# Patient Record
Sex: Female | Born: 1970
Health system: Southern US, Community
[De-identification: ages and names within clinical notes are randomized; demographics above are authoritative.]

## PROBLEM LIST (undated history)

## (undated) DIAGNOSIS — G4730 Sleep apnea, unspecified: Secondary | ICD-10-CM

## (undated) DIAGNOSIS — R7303 Prediabetes: Secondary | ICD-10-CM

## (undated) DIAGNOSIS — F419 Anxiety disorder, unspecified: Secondary | ICD-10-CM

## (undated) DIAGNOSIS — I10 Essential (primary) hypertension: Secondary | ICD-10-CM

## (undated) DIAGNOSIS — R002 Palpitations: Secondary | ICD-10-CM

## (undated) DIAGNOSIS — E119 Type 2 diabetes mellitus without complications: Secondary | ICD-10-CM

## (undated) DIAGNOSIS — C50919 Malignant neoplasm of unspecified site of unspecified female breast: Secondary | ICD-10-CM

## (undated) DIAGNOSIS — K219 Gastro-esophageal reflux disease without esophagitis: Secondary | ICD-10-CM

## (undated) DIAGNOSIS — F32A Depression, unspecified: Secondary | ICD-10-CM

## (undated) HISTORY — DX: Palpitations: R00.2

## (undated) HISTORY — PX: BREAST SURGERY: SHX581

## (undated) HISTORY — DX: Essential (primary) hypertension: I10

## (undated) HISTORY — DX: Type 2 diabetes mellitus without complications: E11.9

## (undated) HISTORY — DX: Sleep apnea, unspecified: G47.30

## (undated) HISTORY — PX: TUBAL LIGATION: SHX77

---

## 2006-08-23 ENCOUNTER — Emergency Department (HOSPITAL_COMMUNITY): Admission: EM | Admit: 2006-08-23 | Discharge: 2006-08-23 | Payer: Self-pay | Admitting: Emergency Medicine

## 2006-09-25 ENCOUNTER — Other Ambulatory Visit: Admission: RE | Admit: 2006-09-25 | Discharge: 2006-09-25 | Payer: Self-pay | Admitting: Internal Medicine

## 2007-02-01 ENCOUNTER — Emergency Department (HOSPITAL_COMMUNITY): Admission: EM | Admit: 2007-02-01 | Discharge: 2007-02-01 | Payer: Self-pay | Admitting: Emergency Medicine

## 2007-03-09 ENCOUNTER — Emergency Department (HOSPITAL_COMMUNITY): Admission: EM | Admit: 2007-03-09 | Discharge: 2007-03-09 | Payer: Self-pay | Admitting: Emergency Medicine

## 2007-04-03 ENCOUNTER — Other Ambulatory Visit: Admission: RE | Admit: 2007-04-03 | Discharge: 2007-04-03 | Payer: Self-pay | Admitting: Internal Medicine

## 2008-09-23 ENCOUNTER — Other Ambulatory Visit: Admission: RE | Admit: 2008-09-23 | Discharge: 2008-09-23 | Payer: Self-pay | Admitting: Internal Medicine

## 2009-06-13 ENCOUNTER — Inpatient Hospital Stay (HOSPITAL_COMMUNITY): Admission: AD | Admit: 2009-06-13 | Discharge: 2009-06-15 | Payer: Self-pay | Admitting: Obstetrics & Gynecology

## 2010-10-31 LAB — CBC
HCT: 31.8 % — ABNORMAL LOW (ref 36.0–46.0)
HCT: 35.3 % — ABNORMAL LOW (ref 36.0–46.0)
Hemoglobin: 10.7 g/dL — ABNORMAL LOW (ref 12.0–15.0)
Hemoglobin: 11.6 g/dL — ABNORMAL LOW (ref 12.0–15.0)
MCHC: 32.9 g/dL (ref 30.0–36.0)
MCHC: 33.6 g/dL (ref 30.0–36.0)
MCV: 89.2 fL (ref 78.0–100.0)
MCV: 90.1 fL (ref 78.0–100.0)
Platelets: 264 10*3/uL (ref 150–400)
Platelets: 277 10*3/uL (ref 150–400)
RBC: 3.53 MIL/uL — ABNORMAL LOW (ref 3.87–5.11)
RBC: 3.96 MIL/uL (ref 3.87–5.11)
RDW: 13.7 % (ref 11.5–15.5)
RDW: 13.9 % (ref 11.5–15.5)
WBC: 13 10*3/uL — ABNORMAL HIGH (ref 4.0–10.5)
WBC: 7.4 10*3/uL (ref 4.0–10.5)

## 2010-10-31 LAB — URINALYSIS, DIPSTICK ONLY
Bilirubin Urine: NEGATIVE
Glucose, UA: NEGATIVE mg/dL
Hgb urine dipstick: NEGATIVE
Ketones, ur: 15 mg/dL — AB
Leukocytes, UA: NEGATIVE
Nitrite: NEGATIVE
Protein, ur: NEGATIVE mg/dL
Specific Gravity, Urine: 1.015 (ref 1.005–1.030)
Urobilinogen, UA: 0.2 mg/dL (ref 0.0–1.0)
pH: 6.5 (ref 5.0–8.0)

## 2010-10-31 LAB — CCBB MATERNAL DONOR DRAW

## 2010-10-31 LAB — RPR: RPR Ser Ql: NONREACTIVE

## 2010-11-30 ENCOUNTER — Encounter (HOSPITAL_COMMUNITY): Payer: 59

## 2010-11-30 ENCOUNTER — Other Ambulatory Visit: Payer: Self-pay | Admitting: Obstetrics and Gynecology

## 2010-11-30 LAB — BASIC METABOLIC PANEL
BUN: 14 mg/dL (ref 6–23)
CO2: 24 mEq/L (ref 19–32)
Calcium: 9.3 mg/dL (ref 8.4–10.5)
Chloride: 103 mEq/L (ref 96–112)
Creatinine, Ser: 0.92 mg/dL (ref 0.4–1.2)
GFR calc Af Amer: >60 mL/min (ref >60)
GFR calc non Af Amer: >60 mL/min (ref >60)
Glucose, Bld: 90 mg/dL (ref 70–99)
Potassium: 3.6 mEq/L (ref 3.5–5.1)
Sodium: 138 mEq/L (ref 135–145)

## 2010-11-30 LAB — CBC
HCT: 43.4 % (ref 36.0–46.0)
Hemoglobin: 14.5 g/dL (ref 12.0–15.0)
MCH: 30.3 pg (ref 26.0–34.0)
MCHC: 33.4 g/dL (ref 30.0–36.0)
MCV: 90.6 fL (ref 78.0–100.0)
Platelets: 257 10*3/uL (ref 150–400)
RBC: 4.79 MIL/uL (ref 3.87–5.11)
RDW: 13.7 % (ref 11.5–15.5)
WBC: 9.2 10*3/uL (ref 4.0–10.5)

## 2010-11-30 LAB — SURGICAL PCR SCREEN: Staphylococcus aureus: INVALID — AB

## 2010-12-03 LAB — MRSA CULTURE

## 2010-12-07 ENCOUNTER — Ambulatory Visit (HOSPITAL_COMMUNITY)
Admission: RE | Admit: 2010-12-07 | Discharge: 2010-12-07 | Disposition: A | Discharge status: Home / Self Care | Payer: 59 | Source: Ambulatory Visit | Attending: Obstetrics and Gynecology | Admitting: Obstetrics and Gynecology

## 2010-12-07 DIAGNOSIS — Z01812 Encounter for preprocedural laboratory examination: Secondary | ICD-10-CM | POA: Insufficient documentation

## 2010-12-07 DIAGNOSIS — Z01818 Encounter for other preprocedural examination: Secondary | ICD-10-CM | POA: Insufficient documentation

## 2010-12-07 DIAGNOSIS — Z302 Encounter for sterilization: Secondary | ICD-10-CM | POA: Insufficient documentation

## 2010-12-07 LAB — PREGNANCY, URINE: Preg Test, Ur: NEGATIVE

## 2010-12-15 NOTE — Op Note (Signed)
  NAMEHETAL, PROANO                ACCOUNT NO.:  [id number]  MEDICAL RECORD NO.:  [id number]           PATIENT TYPE:  O  LOCATION:  [facility]                          [facility]  PHYSICIAN:  Maxie Better, M.D.DATE OF BIRTH:  November 20, 1970  DATE OF PROCEDURE:  12/07/2010 DATE OF DISCHARGE:                              OPERATIVE REPORT   PREOPERATIVE DIAGNOSIS:  Desires sterilization.  PROCEDURE:  Laparoscopic tubal ligation with bipolar cautery.  POSTOPERATIVE DIAGNOSES: 1. Desires sterilization. 2. Fibroid uterus.  ANESTHESIA:  General.  SURGEON:  Maxie Better, MD  ASSISTANT:  None.  PROCEDURE:  Under adequate general anesthesia, the patient was placed in a dorsal lithotomy position.  She was sterilely prepped and draped in the usual fashion.  The bladder was catheterized for small amount of urine.  Examination under anesthesia revealed an irregular anteverted uterus.  No adnexal masses could be appreciated.  A bivalve speculum was placed in the vagina.  Single-tooth tenaculum was placed on the anterior lip of the cervix and acorn cannula was introduced into the cervical os and attached to the tenaculum for manipulation of the uterus.  The bivalve speculum was removed.  Attention was then turned to the abdomen. A 0.25% Marcaine was injected infraumbilically.  An infraumbilical incision was then made and Veress needle was placed without difficulty, then tested with normal saline.  Carbon dioxide was insufflated.  Veress needle was then subsequently removed.  A 10-mm disposable trocar with sleeve was introduced into abdominal cavity without incident. A lighted video laparoscope was placed through that port.  Second incision was made suprapubically and 5-mm port was placed under direct visualization.  Panoramic inspection showed normal liver edge.  The appendix was not viewed.  Fibroid uterus was noted.  No endometriosis in the anterior and posterior cul-de-sac.   Normal ovaries bilaterally. Normal tubes bilaterally.  The midportion of both fallopian tubes was cauterized with a bipolar instrument.  When that was felt to be an adequate, the infraumbilical site was removed.  The abdomen was deflated and the suprapubic site was removed as well.  The incisions were closed with 4-0 Vicryl subcuticular stitches.  The instruments from the vagina was removed.  Specimen was none.  Estimated blood loss was minimal. Complications were none.  The patient tolerated the procedure well and was transferred to recovery room in stable condition.     Maxie Better, M.D.     [place]/MEDQ  D:  12/07/2010  T:  12/07/2010  Job:  [id number]  Electronically Signed by Nena Jordan COUSINS M.D. on 12/15/2010 06:48:27 PM

## 2011-05-13 LAB — POCT URINALYSIS DIP (DEVICE)
Bilirubin Urine: NEGATIVE
Glucose, UA: NEGATIVE
Hgb urine dipstick: NEGATIVE
Ketones, ur: NEGATIVE
Nitrite: NEGATIVE
Operator id: 282151
Protein, ur: NEGATIVE
Specific Gravity, Urine: 1.025
Urobilinogen, UA: 0.2
pH: 5.5

## 2011-05-13 LAB — WET PREP, GENITAL
Trich, Wet Prep: NONE SEEN
Yeast Wet Prep HPF POC: NONE SEEN

## 2011-05-13 LAB — GC/CHLAMYDIA PROBE AMP, GENITAL
Chlamydia, DNA Probe: NEGATIVE
GC Probe Amp, Genital: NEGATIVE

## 2011-12-03 ENCOUNTER — Other Ambulatory Visit (HOSPITAL_COMMUNITY): Payer: Self-pay | Admitting: Obstetrics and Gynecology

## 2011-12-03 DIAGNOSIS — Z1231 Encounter for screening mammogram for malignant neoplasm of breast: Secondary | ICD-10-CM

## 2011-12-06 ENCOUNTER — Encounter: Payer: Self-pay | Admitting: *Deleted

## 2012-01-24 ENCOUNTER — Ambulatory Visit (HOSPITAL_COMMUNITY): Payer: 59

## 2012-02-24 ENCOUNTER — Ambulatory Visit (HOSPITAL_COMMUNITY)
Admission: RE | Admit: 2012-02-24 | Discharge: 2012-02-24 | Disposition: A | Discharge status: Home / Self Care | Payer: 59 | Source: Ambulatory Visit | Attending: Obstetrics and Gynecology | Admitting: Obstetrics and Gynecology

## 2012-02-24 DIAGNOSIS — Z1231 Encounter for screening mammogram for malignant neoplasm of breast: Secondary | ICD-10-CM | POA: Insufficient documentation

## 2012-07-04 ENCOUNTER — Emergency Department (HOSPITAL_BASED_OUTPATIENT_CLINIC_OR_DEPARTMENT_OTHER)
Admission: EM | Admit: 2012-07-04 | Discharge: 2012-07-04 | Disposition: A | Discharge status: Home / Self Care | Payer: 59 | Attending: Emergency Medicine | Admitting: Emergency Medicine

## 2012-07-04 ENCOUNTER — Encounter (HOSPITAL_BASED_OUTPATIENT_CLINIC_OR_DEPARTMENT_OTHER): Payer: Self-pay | Admitting: *Deleted

## 2012-07-04 DIAGNOSIS — I10 Essential (primary) hypertension: Secondary | ICD-10-CM | POA: Insufficient documentation

## 2012-07-04 DIAGNOSIS — Z87891 Personal history of nicotine dependence: Secondary | ICD-10-CM | POA: Insufficient documentation

## 2012-07-04 DIAGNOSIS — Z8679 Personal history of other diseases of the circulatory system: Secondary | ICD-10-CM | POA: Insufficient documentation

## 2012-07-04 DIAGNOSIS — Z3202 Encounter for pregnancy test, result negative: Secondary | ICD-10-CM | POA: Insufficient documentation

## 2012-07-04 DIAGNOSIS — B3731 Acute candidiasis of vulva and vagina: Secondary | ICD-10-CM | POA: Insufficient documentation

## 2012-07-04 DIAGNOSIS — B373 Candidiasis of vulva and vagina: Secondary | ICD-10-CM

## 2012-07-04 LAB — WET PREP, GENITAL: Trich, Wet Prep: NONE SEEN

## 2012-07-04 LAB — PREGNANCY, URINE: Preg Test, Ur: NEGATIVE

## 2012-07-04 LAB — URINALYSIS, ROUTINE W REFLEX MICROSCOPIC
Bilirubin Urine: NEGATIVE
Glucose, UA: NEGATIVE mg/dL
Hgb urine dipstick: NEGATIVE
Ketones, ur: NEGATIVE mg/dL
Nitrite: NEGATIVE
Protein, ur: NEGATIVE mg/dL
Specific Gravity, Urine: 1.019 (ref 1.005–1.030)
Urobilinogen, UA: 0.2 mg/dL (ref 0.0–1.0)
pH: 6 (ref 5.0–8.0)

## 2012-07-04 LAB — URINE MICROSCOPIC-ADD ON

## 2012-07-04 MED ORDER — FLUCONAZOLE 150 MG PO TABS: 150.0000 mg | ORAL_TABLET | Freq: Once | ORAL | Status: DC

## 2012-07-04 NOTE — ED Notes (Signed)
Pt describes white vaginal discharge and itching for 2 days. Some pressure lower abd.

## 2012-07-04 NOTE — ED Provider Notes (Signed)
History     CSN: [id number]  Arrival date & time 07/04/12  1613   First MD Initiated Contact with Patient 07/04/12 1647      Chief Complaint  Patient presents with  . Vaginal Discharge    (Consider location/radiation/quality/duration/timing/severity/associated sxs/prior treatment) Patient is a 41 y.o. female presenting with vaginal discharge. The history is provided by the patient. No language interpreter was used.  Vaginal Discharge This is a new problem. The current episode started in the past 7 days. The problem occurs constantly. The problem has been gradually worsening. Nothing aggravates the symptoms. She has tried nothing for the symptoms. The treatment provided moderate relief.  Pt reports she was on zithromax a week ago  Past Medical History  Diagnosis Date  . Hypertension   . Palpitations     Past Surgical History  Procedure Date  . Tubal ligation     Family History  Problem Relation Age of Onset  . Diabetes    . Stroke    . Hypertension    . Kidney failure    . Coronary artery disease      History  Substance Use Topics  . Smoking status: Former Smoker    Quit date: 07/29/1996  . Smokeless tobacco: Not on file  . Alcohol Use: No    OB History    Grav Para Term Preterm Abortions TAB SAB Ect Mult Living                  Review of Systems  Genitourinary: Positive for vaginal discharge.  All other systems reviewed and are negative.    Allergies  Review of patient's allergies indicates no known allergies.  Home Medications   Current Outpatient Rx  Name  Route  Sig  Dispense  Refill  . ONE-DAILY MULTI VITAMINS PO TABS   Oral   Take 1 tablet by mouth daily.           BP 126/82  Pulse 82  Temp 98.1 F (36.7 C) (Oral)  Resp 20  Ht 5\' 6"  (1.676 m)  Wt 215 lb (97.523 kg)  BMI 34.70 kg/m2  SpO2 100%  LMP 06/21/2012  Physical Exam  Nursing note and vitals reviewed. Constitutional: She appears well-developed and well-nourished.   HENT:  Head: Normocephalic.  Eyes: Conjunctivae normal are normal. Pupils are equal, round, and reactive to light.  Cardiovascular: Normal rate.   Pulmonary/Chest: Effort normal.  Abdominal: Soft.  Genitourinary: Vaginal discharge found.       Thick white discharge, looks like yeast  Musculoskeletal: Normal range of motion.  Neurological: She is alert.  Skin: Skin is warm.  Psychiatric: She has a normal mood and affect.    ED Course  Procedures (including critical care time)  Labs Reviewed  URINALYSIS, ROUTINE W REFLEX MICROSCOPIC - Abnormal; Notable for the following:    Leukocytes, UA SMALL (*)     All other components within normal limits  URINE MICROSCOPIC-ADD ON - Abnormal; Notable for the following:    Squamous Epithelial / LPF FEW (*)     Bacteria, UA FEW (*)     All other components within normal limits  PREGNANCY, URINE   No results found.   No diagnosis found.    MDM  Pt given rx for diflucan.   Cultures pending        Elson Areas, [place] 07/04/12 1742

## 2012-07-05 LAB — GC/CHLAMYDIA PROBE AMP
CT Probe RNA: NEGATIVE
GC Probe RNA: NEGATIVE

## 2012-07-05 NOTE — ED Provider Notes (Signed)
Medical screening examination/treatment/procedure(s) were performed by non-physician practitioner and as supervising physician I was immediately available for consultation/collaboration.  Doug Sou, MD 07/05/12 [phone]

## 2012-07-08 LAB — URINE CULTURE: Colony Count: >=100000

## 2012-07-17 ENCOUNTER — Telehealth (HOSPITAL_COMMUNITY): Payer: Self-pay | Admitting: Emergency Medicine

## 2012-07-18 NOTE — ED Notes (Signed)
Chart returned from EDP office. Per Roxy Horseman PA-C, start Macrobid 100 mg S.R. BID x 7 days. Return or follow-up with PCP if symptoms worsen or fever >102.

## 2012-07-18 NOTE — ED Notes (Signed)
Rx called in to [facility] by Jaci Lazier PFM.

## 2012-07-18 NOTE — ED Notes (Signed)
+   Urine Chart sent to EDP office for review. 

## 2013-03-08 ENCOUNTER — Other Ambulatory Visit (HOSPITAL_COMMUNITY): Payer: Self-pay | Admitting: Obstetrics and Gynecology

## 2013-03-08 DIAGNOSIS — Z1231 Encounter for screening mammogram for malignant neoplasm of breast: Secondary | ICD-10-CM

## 2013-03-09 ENCOUNTER — Ambulatory Visit (HOSPITAL_COMMUNITY)
Admission: RE | Admit: 2013-03-09 | Discharge: 2013-03-09 | Disposition: A | Discharge status: Home / Self Care | Payer: 59 | Source: Ambulatory Visit | Attending: Obstetrics and Gynecology | Admitting: Obstetrics and Gynecology

## 2013-03-09 DIAGNOSIS — Z1231 Encounter for screening mammogram for malignant neoplasm of breast: Secondary | ICD-10-CM | POA: Insufficient documentation

## 2013-07-22 ENCOUNTER — Emergency Department (HOSPITAL_COMMUNITY): Payer: 59

## 2013-07-22 ENCOUNTER — Emergency Department (HOSPITAL_COMMUNITY)
Admission: EM | Admit: 2013-07-22 | Discharge: 2013-07-22 | Disposition: A | Discharge status: Home / Self Care | Payer: 59 | Attending: Emergency Medicine | Admitting: Emergency Medicine

## 2013-07-22 ENCOUNTER — Encounter (HOSPITAL_COMMUNITY): Payer: Self-pay | Admitting: Emergency Medicine

## 2013-07-22 DIAGNOSIS — Z87891 Personal history of nicotine dependence: Secondary | ICD-10-CM | POA: Insufficient documentation

## 2013-07-22 DIAGNOSIS — Z79899 Other long term (current) drug therapy: Secondary | ICD-10-CM | POA: Insufficient documentation

## 2013-07-22 DIAGNOSIS — E669 Obesity, unspecified: Secondary | ICD-10-CM | POA: Insufficient documentation

## 2013-07-22 DIAGNOSIS — R079 Chest pain, unspecified: Secondary | ICD-10-CM

## 2013-07-22 DIAGNOSIS — Z8679 Personal history of other diseases of the circulatory system: Secondary | ICD-10-CM | POA: Insufficient documentation

## 2013-07-22 DIAGNOSIS — R0789 Other chest pain: Secondary | ICD-10-CM | POA: Insufficient documentation

## 2013-07-22 DIAGNOSIS — I10 Essential (primary) hypertension: Secondary | ICD-10-CM | POA: Insufficient documentation

## 2013-07-22 LAB — BASIC METABOLIC PANEL
BUN: 19 mg/dL (ref 6–23)
CO2: 25 mEq/L (ref 19–32)
Calcium: 9.5 mg/dL (ref 8.4–10.5)
Chloride: 102 mEq/L (ref 96–112)
Creatinine, Ser: 0.99 mg/dL (ref 0.50–1.10)
GFR calc Af Amer: 81 mL/min — ABNORMAL LOW (ref >90)
GFR calc non Af Amer: 70 mL/min — ABNORMAL LOW (ref >90)
Glucose, Bld: 103 mg/dL — ABNORMAL HIGH (ref 70–99)
Potassium: 3.6 mEq/L (ref 3.5–5.1)
Sodium: 137 mEq/L (ref 135–145)

## 2013-07-22 LAB — CBC WITH DIFFERENTIAL/PLATELET
Basophils Absolute: 0 10*3/uL (ref 0.0–0.1)
Basophils Relative: 1 % (ref 0–1)
Eosinophils Absolute: 0.4 10*3/uL (ref 0.0–0.7)
Eosinophils Relative: 5 % (ref 0–5)
HCT: 40.5 % (ref 36.0–46.0)
Hemoglobin: 13.6 g/dL (ref 12.0–15.0)
Lymphocytes Relative: 40 % (ref 12–46)
Lymphs Abs: 3 10*3/uL (ref 0.7–4.0)
MCH: 28.7 pg (ref 26.0–34.0)
MCHC: 33.6 g/dL (ref 30.0–36.0)
MCV: 85.4 fL (ref 78.0–100.0)
Monocytes Absolute: 0.9 10*3/uL (ref 0.1–1.0)
Monocytes Relative: 11 % (ref 3–12)
Neutro Abs: 3.3 10*3/uL (ref 1.7–7.7)
Neutrophils Relative %: 43 % (ref 43–77)
Platelets: 288 10*3/uL (ref 150–400)
RBC: 4.74 MIL/uL (ref 3.87–5.11)
RDW: 14.1 % (ref 11.5–15.5)
WBC: 7.5 10*3/uL (ref 4.0–10.5)

## 2013-07-22 LAB — POCT I-STAT TROPONIN I
Troponin i, poc: 0 ng/mL (ref 0.00–0.08)
Troponin i, poc: 0 ng/mL (ref 0.00–0.08)

## 2013-07-22 MED ORDER — HYDROCODONE-ACETAMINOPHEN 5-325 MG PO TABS: 1.0000 | ORAL_TABLET | Freq: Four times a day (QID) | ORAL | Status: DC | PRN

## 2013-07-22 MED ORDER — KETOROLAC TROMETHAMINE 30 MG/ML IJ SOLN
30.0000 mg | Freq: Once | INTRAMUSCULAR | Status: AC
  Administered 2013-07-22: 30 mg via INTRAVENOUS
  Filled 2013-07-22: qty 1

## 2013-07-22 MED ORDER — IBUPROFEN 600 MG PO TABS: 600.0000 mg | ORAL_TABLET | Freq: Four times a day (QID) | ORAL | Status: DC | PRN

## 2013-07-22 NOTE — ED Notes (Signed)
Pt c/o chest pain x last 3 days.  States the pain is on both sides of chest and will radiate to LT jaw and LT upper back.  Also c/o hot flashes when the pain comes.  Denies N/V or shob, but is feeling sleepy.  Her family states she is under more stress than usual.

## 2013-07-22 NOTE — ED Provider Notes (Signed)
CSN: [id number]     Arrival date & time 07/22/13  1431 History   First MD Initiated Contact with Patient 07/22/13 1454     Chief Complaint  Patient presents with  . Chest Pain   (Consider location/radiation/quality/duration/timing/severity/associated sxs/prior Treatment) HPI  This a 42 year old female who presents with 3 days of intermittent chest pain. Patient has a history of hypertension. Patient reports 3 days of left-sided sharp chest pain that radiates into her left arm and left shoulder blade. She denies any exertional component to the pain. She denies any shortness of breath or leg swelling. Patient states that the pain gets better with aspirin use. She took 2 baby aspirin prior to arrival and currently her pain is 5/10.  She denies any leg swelling, recent hospitalizations, recent surgery, or any other risk factor for PE. Current pain started one hour prior to arrival. Patient states that she felt her pain was related to her bra strap digging into her shoulder; however the pain didn't get better when she took off her bra.  Patient has an early family history of heart disease including mother and brother who have heart attacks in their 57s. Otherwise patient denies any history of high cholesterol or current smoking.  Past Medical History  Diagnosis Date  . Hypertension   . Palpitations    Past Surgical History  Procedure Laterality Date  . Tubal ligation     Family History  Problem Relation Age of Onset  . Diabetes    . Stroke    . Hypertension    . Kidney failure    . Coronary artery disease     History  Substance Use Topics  . Smoking status: Former Smoker    Quit date: 07/29/1996  . Smokeless tobacco: Not on file  . Alcohol Use: No   OB History   Grav Para Term Preterm Abortions TAB SAB Ect Mult Living                 Review of Systems  Constitutional: Negative for fever.  Respiratory: Positive for chest tightness. Negative for cough and shortness of breath.    Cardiovascular: Positive for chest pain. Negative for leg swelling.  Gastrointestinal: Negative for nausea, vomiting and abdominal pain.  Genitourinary: Negative for dysuria.  Musculoskeletal: Negative for back pain.  Skin: Negative for rash.  Neurological: Negative for headaches.  Psychiatric/Behavioral: Negative for confusion.  All other systems reviewed and are negative.    Allergies  Review of patient's allergies indicates no known allergies.  Home Medications   Current Outpatient Rx  Name  Route  Sig  Dispense  Refill  . hydrochlorothiazide (HYDRODIURIL) 25 MG tablet   Oral   Take 25 mg by mouth daily.         . Multiple Vitamin (MULTIVITAMIN) tablet   Oral   Take 1 tablet by mouth daily.         . pantoprazole (PROTONIX) 40 MG tablet   Oral   Take 40 mg by mouth daily.         Marland Kitchen HYDROcodone-acetaminophen (NORCO/VICODIN) 5-325 MG per tablet   Oral   Take 1 tablet by mouth every 6 (six) hours as needed.   6 tablet   0   . ibuprofen (ADVIL,MOTRIN) 600 MG tablet   Oral   Take 1 tablet (600 mg total) by mouth every 6 (six) hours as needed.   30 tablet   0    BP 118/84  Pulse 78  Temp(Src) 98 F (  36.7 C) (Oral)  Resp 16  SpO2 97%  LMP 06/24/2013 Physical Exam  Nursing note and vitals reviewed. Constitutional: She is oriented to person, place, and time. She appears well-developed and well-nourished. No distress.  obese  HENT:  Head: Normocephalic and atraumatic.  Eyes: Pupils are equal, round, and reactive to light.  Neck: Neck supple. No JVD present.  Cardiovascular: Normal rate, regular rhythm and normal heart sounds.   No murmur heard. Pulmonary/Chest: Effort normal and breath sounds normal. No respiratory distress. She has no wheezes. She exhibits tenderness.  Tenderness to palpation over the left chest wall and shoulder with reproducible pain  Abdominal: Soft. Bowel sounds are normal. There is no tenderness.  Musculoskeletal: She exhibits no  edema.  Neurological: She is alert and oriented to person, place, and time.  Skin: Skin is warm and dry.  Psychiatric: She has a normal mood and affect.    ED Course  Procedures (including critical care time) Labs Review Labs Reviewed  BASIC METABOLIC PANEL - Abnormal; Notable for the following:    Glucose, Bld 103 (*)    GFR calc non Af Amer 70 (*)    GFR calc Af Amer 81 (*)    All other components within normal limits  CBC WITH DIFFERENTIAL  POCT I-STAT TROPONIN I  POCT I-STAT TROPONIN I   Imaging Review Dg Chest 2 View  07/22/2013   CLINICAL DATA:  Chest pain  EXAM: CHEST  2 VIEW  COMPARISON:  February 01, 2007  FINDINGS: Mild eventration of the left hemidiaphragm is stable. There is no edema or consolidation. Heart size and pulmonary vascularity are normal. No adenopathy. No pneumothorax. No bone lesions. There is thoracolumbar levoscoliosis.  IMPRESSION: No edema or consolidation.   Electronically Signed   By: Bretta Bang M.D.   On: 07/22/2013 15:25    EKG Interpretation    Date/Time:  Thursday July 22 2013 14:54:44 EST Ventricular Rate:  80 PR Interval:  130 QRS Duration: 88 QT Interval:  375 QTC Calculation: 433 R Axis:   50 Text Interpretation:  Sinus rhythm T wave flattening lateral leads No significant change since last tracing Confirmed by HORTON  MD, COURTNEY ([id number]) on 07/22/2013 3:13:11 PM            MDM   1. Chest pain    This a 42 year old female who presents with intermittent chest pain for the last 3 days. She is nontoxic-appearing on exam. EKG is nonischemic and unchanged from prior. Patient has risk factors of hypertension and early family history of heart disease. She has taken aspirin prior to arrival. On exam, patient has reproducible chest pain over the left anterior chest and shoulder along where her bra sits. She states that this is the exact same pain she's been feeling. Patient was given Toradol and Norco for suspected musculoskeletal  pain. Initial troponin is negative. Patient is PERC negative. The troponin is also negative. Patient has had improvement of her pain was anti-inflammatories and Norco. Patient's TIMI score is 1 for ASA use.  Given that she does have risk factors including early family history, patient should be evaluated for stress testing on an outpatient basis. Have low suspicion for ACS at this time given reproducibility of pain on exam and atypical features of the pain. I discussed this with the patient at length. She will be given a referral to cardiology and is to call tomorrow to set up stress testing. Patient stated understanding. If she has recurrence of pain, worsening of  pain, or any worsening of symptoms she is to return for further evaluation.  After history, exam, and medical workup I feel the patient has been appropriately medically screened and is safe for discharge home. Pertinent diagnoses were discussed with the patient. Patient was given return precautions.     Shon Baton, MD 07/22/13 [phone]

## 2013-07-22 NOTE — ED Notes (Signed)
Pt from home c/o L chest/shoulder pain x3 days that radiates to back. Pt denies N/V/, diaphoresis, dizziness. Pt is A&O and in NAD

## 2013-07-30 ENCOUNTER — Ambulatory Visit (HOSPITAL_COMMUNITY)
Admission: RE | Admit: 2013-07-30 | Discharge: 2013-07-30 | Disposition: A | Discharge status: Home / Self Care | Payer: 59 | Source: Ambulatory Visit | Attending: Internal Medicine | Admitting: Internal Medicine

## 2013-07-30 ENCOUNTER — Encounter (HOSPITAL_COMMUNITY): Payer: Self-pay

## 2013-07-30 ENCOUNTER — Other Ambulatory Visit (HOSPITAL_COMMUNITY): Payer: Self-pay | Admitting: Internal Medicine

## 2013-07-30 DIAGNOSIS — R079 Chest pain, unspecified: Secondary | ICD-10-CM

## 2013-07-30 DIAGNOSIS — R799 Abnormal finding of blood chemistry, unspecified: Secondary | ICD-10-CM | POA: Insufficient documentation

## 2013-07-30 MED ORDER — IOHEXOL 350 MG/ML SOLN
100.0000 mL | Freq: Once | INTRAVENOUS | Status: AC | PRN
  Administered 2013-07-30: 100 mL via INTRAVENOUS

## 2013-08-02 ENCOUNTER — Encounter (INDEPENDENT_AMBULATORY_CARE_PROVIDER_SITE_OTHER): Payer: Self-pay

## 2013-08-03 ENCOUNTER — Encounter: Payer: Self-pay | Admitting: Interventional Cardiology

## 2013-08-03 ENCOUNTER — Ambulatory Visit (INDEPENDENT_AMBULATORY_CARE_PROVIDER_SITE_OTHER): Payer: 59 | Admitting: Interventional Cardiology

## 2013-08-03 VITALS — BP 120/100 | HR 68 | Ht 66.0 in | Wt 261.8 lb

## 2013-08-03 DIAGNOSIS — R079 Chest pain, unspecified: Secondary | ICD-10-CM

## 2013-08-03 DIAGNOSIS — Z8249 Family history of ischemic heart disease and other diseases of the circulatory system: Secondary | ICD-10-CM | POA: Insufficient documentation

## 2013-08-03 DIAGNOSIS — I10 Essential (primary) hypertension: Secondary | ICD-10-CM

## 2013-08-03 NOTE — Progress Notes (Signed)
Patient ID: Grace Pennington, female   DOB: October 01, 1970, 43 y.o.   MRN: [id number]     Patient ID: Grace Pennington MRN: [id number] DOB/AGE: 1971/04/19 43 y.o.   Referring Physician Dr. Delfina Redwood   Reason for Consultation chest pain  HPI: 43 y/o with a family h/o CAD.  Mother had an MI at age 37.  Around Christmas, she started having CP daily.  Episodes feel like a sharp stabbing pain.  It will be in the center of her chest and in her back.  Not related to activity.  She went to the ER after having a hot flash with the CP.  W/u was negative.  Walking stairs cuases no problems.  She was given narcotis in the ER for atypical chest pain but has not taken this.  BP at home is usually well controled.    Current Outpatient Prescriptions  Medication Sig Dispense Refill  . hydrochlorothiazide (HYDRODIURIL) 25 MG tablet Take 25 mg by mouth daily.      Marland Kitchen HYDROcodone-acetaminophen (NORCO/VICODIN) 5-325 MG per tablet Take 1 tablet by mouth every 6 (six) hours as needed.  6 tablet  0  . ibuprofen (ADVIL,MOTRIN) 600 MG tablet Take 1 tablet (600 mg total) by mouth every 6 (six) hours as needed.  30 tablet  0  . Multiple Vitamin (MULTIVITAMIN) tablet Take 1 tablet by mouth daily.      . pantoprazole (PROTONIX) 40 MG tablet Take 40 mg by mouth daily.       No current facility-administered medications for this visit.   Past Medical History  Diagnosis Date  . Hypertension   . Palpitations     Family History  Problem Relation Age of Onset  . Diabetes    . Stroke    . Hypertension    . Kidney failure    . Coronary artery disease      History   Social History  . Marital Status: Single    Spouse Name: N/A    Number of Children: 2  . Years of Education: N/A   Occupational History  . medical records [place] History Main Topics  . Smoking status: Former Smoker    Quit date: 07/29/1996  . Smokeless tobacco: Not on file  . Alcohol Use: No  . Drug Use: No  . Sexual Activity: Yes   Birth Control/ Protection: Surgical   Other Topics Concern  . Not on file   Social History Narrative  . No narrative on file    Past Surgical History  Procedure Laterality Date  . Tubal ligation        (Not in a hospital admission)  Review of systems complete and found to be negative unless listed above .  No nausea, vomiting.  No fever chills, No focal weakness,  No palpitations.  Physical Exam: Filed Vitals:   08/03/13 1215  BP: 120/100  Pulse: 68    Weight: 261 lb 12.8 oz (118.752 kg)  Physical exam: [place]/AT EOMI No JVD, No carotid bruit RRR S1S2  No wheezing Soft. NT, nondistended No edema. No focal motor or sensory deficits Normal affect  Labs:   Lab Results  Component Value Date   WBC 7.5 07/22/2013   HGB 13.6 07/22/2013   HCT 40.5 07/22/2013   MCV 85.4 07/22/2013   PLT 288 07/22/2013   No results found for this basename: NA, K, CL, CO2, BUN, CREATININE, CALCIUM, LABALBU, PROT, BILITOT, ALKPHOS, ALT, AST, GLUCOSE,  in the last 168 hours No  results found for this basename: CKTOTAL, CKMB, CKMBINDEX, TROPONINI    No results found for this basename: CHOL   No results found for this basename: HDL   No results found for this basename: LDLCALC   No results found for this basename: TRIG   No results found for this basename: CHOLHDL   No results found for this basename: LDLDIRECT      Radiology: No pulmonary edema EKG: NSR, NSST  ASSESSMENT AND PLAN:  1. chest pain: Several atypical features. She does have a family history of heart disease. We'll plan for exercise treadmill test. 2. Family h/o CAD: Mother with MI at age 63.  Lipids have been well controlled. Last LDL was 295. 3. HTN: Systolic control today. Diastolic is elevated. Blood pressures at home are typically much better controlled. Continue to monitor. Signed:   Mina Marble, MD, [facility] 08/03/2013, 1:08 PM

## 2013-08-03 NOTE — Patient Instructions (Signed)
Your physician has requested that you have an exercise tolerance test. For further information please visit [email or web address]. Please also follow instruction sheet, as given.  Your physician recommends that you schedule a follow-up appointment as needed.   

## 2013-09-02 ENCOUNTER — Encounter: Payer: 59 | Admitting: Physician Assistant

## 2013-10-01 ENCOUNTER — Encounter (INDEPENDENT_AMBULATORY_CARE_PROVIDER_SITE_OTHER): Payer: Self-pay

## 2013-10-01 ENCOUNTER — Ambulatory Visit (INDEPENDENT_AMBULATORY_CARE_PROVIDER_SITE_OTHER): Payer: 59 | Admitting: Physician Assistant

## 2013-10-01 DIAGNOSIS — R079 Chest pain, unspecified: Secondary | ICD-10-CM

## 2013-10-01 NOTE — Patient Instructions (Signed)
Your physician recommends that you schedule a follow-up appointment in: 2 MONTHS WITH DR. VARANASI

## 2013-10-01 NOTE — Progress Notes (Signed)
Exercise Treadmill Test  Pre-Exercise Testing Evaluation Rhythm: normal sinus  Rate: 86 bpm     Test  Exercise Tolerance Test Ordering MD: Casandra Doffing, MD  Interpreting MD: Richardson Dopp, PA-C  Unique Test No: 1  Treadmill:  1  Indication for ETT: chest pain - rule out ischemia  Contraindication to ETT: No   Stress Modality: exercise - treadmill  Cardiac Imaging Performed: non   Protocol: standard Bruce - maximal  Max BP:  146/73  Max MPHR (bpm):  179 85% MPR (bpm):  152  MPHR obtained (bpm):  169 % MPHR obtained:  94  Reached 85% MPHR (min:sec):  4:20 Total Exercise Time (min-sec):  7:00  Workload in METS:  8.7 Borg Scale: 15  Reason ETT Terminated:  patient's desire to stop    ST Segment Analysis At Rest: non-specific ST segment slurring With Exercise: borderline ST changes  Other Information Arrhythmia:  No Angina during ETT:  absent (0) Quality of ETT:  indeterminate  ETT Interpretation:  borderline (indeterminate) with non-specific ST changes  Comments: Good exercise capacity. No chest pain. Normal BP response to exercise. There were borderline ST changes at peak exercise.   Recommendations: Overall, low risk ETT.  Reviewed with Dr. Casandra Doffing. Patient has not had CP since last seen.  Will have her f/u with Dr. Irish Lack in 2 mos.  If she has more chest pain, she will contact us.  Would pursue ETT-Echo at that point. F/u with Dr. Casandra Doffing as directed. Signed,  Richardson Dopp, PA-C   10/01/2013 11:11 AM

## 2013-12-27 ENCOUNTER — Ambulatory Visit: Payer: 59 | Admitting: Interventional Cardiology

## 2013-12-31 ENCOUNTER — Ambulatory Visit: Payer: 59 | Admitting: Interventional Cardiology

## 2014-02-20 ENCOUNTER — Emergency Department (HOSPITAL_COMMUNITY): Payer: 59

## 2014-02-20 ENCOUNTER — Encounter (HOSPITAL_COMMUNITY): Payer: Self-pay | Admitting: Emergency Medicine

## 2014-02-20 ENCOUNTER — Emergency Department (HOSPITAL_COMMUNITY)
Admission: EM | Admit: 2014-02-20 | Discharge: 2014-02-20 | Disposition: A | Discharge status: Home / Self Care | Payer: 59 | Attending: Emergency Medicine | Admitting: Emergency Medicine

## 2014-02-20 DIAGNOSIS — R079 Chest pain, unspecified: Secondary | ICD-10-CM

## 2014-02-20 DIAGNOSIS — R0789 Other chest pain: Secondary | ICD-10-CM

## 2014-02-20 DIAGNOSIS — I10 Essential (primary) hypertension: Secondary | ICD-10-CM | POA: Insufficient documentation

## 2014-02-20 DIAGNOSIS — Z7982 Long term (current) use of aspirin: Secondary | ICD-10-CM | POA: Insufficient documentation

## 2014-02-20 DIAGNOSIS — R072 Precordial pain: Secondary | ICD-10-CM | POA: Insufficient documentation

## 2014-02-20 DIAGNOSIS — Z79899 Other long term (current) drug therapy: Secondary | ICD-10-CM | POA: Insufficient documentation

## 2014-02-20 DIAGNOSIS — K219 Gastro-esophageal reflux disease without esophagitis: Secondary | ICD-10-CM | POA: Insufficient documentation

## 2014-02-20 DIAGNOSIS — Z87891 Personal history of nicotine dependence: Secondary | ICD-10-CM | POA: Insufficient documentation

## 2014-02-20 LAB — COMPREHENSIVE METABOLIC PANEL
ALT: 19 U/L (ref 0–35)
AST: 20 U/L (ref 0–37)
Albumin: 3.8 g/dL (ref 3.5–5.2)
Alkaline Phosphatase: 66 U/L (ref 39–117)
Anion gap: 11 (ref 5–15)
BUN: 14 mg/dL (ref 6–23)
CO2: 29 mEq/L (ref 19–32)
Calcium: 9.6 mg/dL (ref 8.4–10.5)
Chloride: 98 mEq/L (ref 96–112)
Creatinine, Ser: 0.9 mg/dL (ref 0.50–1.10)
GFR calc Af Amer: >90 mL/min (ref >90)
GFR calc non Af Amer: 78 mL/min — ABNORMAL LOW (ref >90)
Glucose, Bld: 103 mg/dL — ABNORMAL HIGH (ref 70–99)
Potassium: 3.3 mEq/L — ABNORMAL LOW (ref 3.7–5.3)
Sodium: 138 mEq/L (ref 137–147)
Total Bilirubin: 0.3 mg/dL (ref 0.3–1.2)
Total Protein: 8.3 g/dL (ref 6.0–8.3)

## 2014-02-20 LAB — I-STAT TROPONIN, ED: Troponin i, poc: 0 ng/mL (ref 0.00–0.08)

## 2014-02-20 LAB — CBC
HCT: 40 % (ref 36.0–46.0)
Hemoglobin: 12.8 g/dL (ref 12.0–15.0)
MCH: 26.6 pg (ref 26.0–34.0)
MCHC: 32 g/dL (ref 30.0–36.0)
MCV: 83 fL (ref 78.0–100.0)
Platelets: 331 10*3/uL (ref 150–400)
RBC: 4.82 MIL/uL (ref 3.87–5.11)
RDW: 15.1 % (ref 11.5–15.5)
WBC: 8.5 10*3/uL (ref 4.0–10.5)

## 2014-02-20 MED ORDER — GI COCKTAIL ~~LOC~~
30.0000 mL | Freq: Once | ORAL | Status: AC
  Administered 2014-02-20: 30 mL via ORAL
  Filled 2014-02-20: qty 30

## 2014-02-20 MED ORDER — POTASSIUM CHLORIDE CRYS ER 20 MEQ PO TBCR
20.0000 meq | EXTENDED_RELEASE_TABLET | Freq: Once | ORAL | Status: AC
  Administered 2014-02-20: 20 meq via ORAL
  Filled 2014-02-20: qty 1

## 2014-02-20 MED ORDER — KETOROLAC TROMETHAMINE 30 MG/ML IJ SOLN
30.0000 mg | Freq: Once | INTRAMUSCULAR | Status: AC
  Administered 2014-02-20: 30 mg via INTRAVENOUS
  Filled 2014-02-20: qty 1

## 2014-02-20 MED ORDER — SUCRALFATE 1 G PO TABS: 1.0000 g | ORAL_TABLET | Freq: Three times a day (TID) | ORAL | Status: DC

## 2014-02-20 MED ORDER — NITROGLYCERIN 0.4 MG SL SUBL
0.4000 mg | SUBLINGUAL_TABLET | SUBLINGUAL | Status: DC | PRN
  Administered 2014-02-20: 0.4 mg via SUBLINGUAL
  Filled 2014-02-20: qty 1

## 2014-02-20 NOTE — ED Provider Notes (Signed)
Medical screening examination/treatment/procedure(s) were performed by non-physician practitioner and as supervising physician I was immediately available for consultation/collaboration.   EKG Interpretation   Date/Time:  Sunday February 20 2014 02:47:03 EDT Ventricular Rate:  84 PR Interval:  164 QRS Duration: 93 QT Interval:  378 QTC Calculation: 447 R Axis:   54 Text Interpretation:  Sinus rhythm Borderline T abnormalities, inferior  leads No acute findings Confirmed by Kathrynn Humble, MD, ANKIT ([id number]) on  02/20/2014 4:24:34 AM       Varney Biles, MD 02/20/14 2310

## 2014-02-20 NOTE — ED Provider Notes (Signed)
CSN: [id number]     Arrival date & time 02/20/14  0228 History   First MD Initiated Contact with Patient 02/20/14 0325     Chief Complaint  Patient presents with  . Chest Pain     (Consider location/radiation/quality/duration/timing/severity/associated sxs/prior Treatment) HPI Comments: This is a morbidly obese, African American female, with a history of recurrent chest discomfort.  With this episode started 4 days ago.  In the epigastric area without radiation to her back or neck.  She states she also has a history of gastric reflux, disease.  She has seen cardiology in the past for her discomfort, and she is scheduled for an echocardiogram.  Next week.  She recently had a cardiac stress test, which was normal she has not taken any medication for her discomfort.  She denies any recent travel, leg swelling.  Use of any hormone replacement therapy, history of previous DVT, PE  Patient is a 43 y.o. female presenting with chest pain. The history is provided by the patient.  Chest Pain Pain location:  Substernal area and epigastric Pain quality: aching   Pain radiates to:  Does not radiate Pain radiates to the back: no   Pain severity:  Moderate Onset quality:  Gradual Duration:  4 days Timing:  Constant Progression:  Waxing and waning Chronicity:  Recurrent Context: at rest   Context: not breathing, no drug use, not eating, no intercourse, not lifting, no movement, not raising an arm, no stress and no trauma   Relieved by:  None tried Worsened by:  Nothing tried Ineffective treatments:  None tried Associated symptoms: heartburn   Associated symptoms: no abdominal pain, no anxiety, no cough, no dizziness, no fever, no headache, no lower extremity edema, no nausea, not vomiting and no weakness   Risk factors: hypertension and obesity     Past Medical History  Diagnosis Date  . Hypertension   . Palpitations    Past Surgical History  Procedure Laterality Date  . Tubal ligation      Family History  Problem Relation Age of Onset  . Diabetes    . Stroke    . Hypertension    . Kidney failure    . Coronary artery disease     History  Substance Use Topics  . Smoking status: Former Smoker    Quit date: 07/29/1996  . Smokeless tobacco: Not on file  . Alcohol Use: No   OB History   Grav Para Term Preterm Abortions TAB SAB Ect Mult Living                 Review of Systems  Constitutional: Negative for fever.  Respiratory: Negative for cough and chest tightness.   Cardiovascular: Positive for chest pain.  Gastrointestinal: Positive for heartburn. Negative for nausea, vomiting and abdominal pain.  Skin: Negative for rash and wound.  Neurological: Negative for dizziness, weakness and headaches.  All other systems reviewed and are negative.     Allergies  Review of patient's allergies indicates no known allergies.  Home Medications   Prior to Admission medications   Medication Sig Start Date End Date Taking? Authorizing Provider  aspirin 81 MG tablet Take 162 mg by mouth once. For chest pain   Yes Historical Provider, MD  hydrochlorothiazide (HYDRODIURIL) 25 MG tablet Take 25 mg by mouth daily.   Yes Historical Provider, MD  ibuprofen (ADVIL,MOTRIN) 600 MG tablet Take 1 tablet (600 mg total) by mouth every 6 (six) hours as needed. 07/22/13  Yes Barbette Hair  Horton, MD  naphazoline-pheniramine (NAPHCON-A) 0.025-0.3 % ophthalmic solution Place 2 drops into both eyes 4 (four) times daily as needed for irritation.   Yes Historical Provider, MD  pantoprazole (PROTONIX) 40 MG tablet Take 40 mg by mouth daily.   Yes Historical Provider, MD  sucralfate (CARAFATE) 1 G tablet Take 1 tablet (1 g total) by mouth 4 (four) times daily -  with meals and at bedtime. 02/20/14   Garald Balding, NP   BP 98/78  Pulse 87  Temp(Src) 98.5 F (36.9 C) (Oral)  Resp 11  SpO2 98%  LMP 02/06/2014 Physical Exam  Nursing note and vitals reviewed. Constitutional: She is oriented to  person, place, and time. She appears well-developed and well-nourished. No distress.  HENT:  Head: Normocephalic and atraumatic.  Eyes: Pupils are equal, round, and reactive to light.  Neck: Normal range of motion.  Cardiovascular: Normal rate and regular rhythm.   Pulmonary/Chest: Effort normal and breath sounds normal. No respiratory distress. She has no wheezes. She exhibits no tenderness.  Abdominal: Soft.  Musculoskeletal: Normal range of motion.  Neurological: She is alert and oriented to person, place, and time.  Skin: Skin is warm. She is not diaphoretic.  Psychiatric: She has a normal mood and affect.    ED Course  Procedures (including critical care time) Labs Review Labs Reviewed  COMPREHENSIVE METABOLIC PANEL - Abnormal; Notable for the following:    Potassium 3.3 (*)    Glucose, Bld 103 (*)    GFR calc non Af Amer 78 (*)    All other components within normal limits  CBC  I-STAT TROPOININ, ED    Imaging Review Dg Chest 2 View  02/20/2014   CLINICAL DATA:  Left-sided chest pain.  History of smoking.  EXAM: CHEST  2 VIEW  COMPARISON:  Chest radiograph performed 07/22/2013, and CTA of the chest performed 07/30/2013  FINDINGS: The lungs are well-aerated and clear. There is no evidence of focal opacification, pleural effusion or pneumothorax.  The heart is normal in size; the mediastinal contour is within normal limits. No acute osseous abnormalities are seen. There is mild elevation of the left hemidiaphragm.  IMPRESSION: No acute cardiopulmonary process seen.   Electronically Signed   By: Garald Balding M.D.   On: 02/20/2014 03:30     EKG Interpretation   Date/Time:  Sunday February 20 2014 02:47:03 EDT Ventricular Rate:  84 PR Interval:  164 QRS Duration: 93 QT Interval:  378 QTC Calculation: 447 R Axis:   54 Text Interpretation:  Sinus rhythm Borderline T abnormalities, inferior  leads No acute findings Confirmed by Kathrynn Humble, MD, ANKIT ([id number]) on  02/20/2014 4:24:34  AM      MDM  Patient's troponin is negative.  She did receive considerable relief with a GI cocktail, I think her pain is more related to her GERD than cardiac.  Patient has been encouraged to keep her appointment for her echocardiogram.  Have also referred her to gastroenterology Final diagnoses:  Chest pain of unknown etiology  Gastroesophageal reflux disease without esophagitis         Garald Balding, NP 02/20/14 [phone]

## 2014-02-20 NOTE — ED Notes (Signed)
Patient is from home. Patient is here with c/o chest pain that started 4 days ago. Patient states the pain woke her up out of her sleep tonight, which is new. Patient states her last meal was at 1700 yesterday evening.  Patient ha hx of GERD and takes meds daily for maintenance. Patient states she took 2 81mg  Asprin before arrival. Denies N/V or headache.

## 2014-02-20 NOTE — ED Notes (Signed)
Pt sts chest pain started 4 days ago, 2 nights ago she took nitroglycerin from her father and that gave her relief, has echocardiogram scheduled in September.

## 2014-02-20 NOTE — Discharge Instructions (Signed)
Chest Pain (Nonspecific) °It is often hard to give a specific diagnosis for the cause of chest pain. There is always a chance that your pain could be related to something serious, such as a heart attack or a blood clot in the lungs. You need to follow up with your health care provider for further evaluation. °CAUSES  °· Heartburn. °· Pneumonia or bronchitis. °· Anxiety or stress. °· Inflammation around your heart (pericarditis) or lung (pleuritis or pleurisy). °· A blood clot in the lung. °· A collapsed lung (pneumothorax). It can develop suddenly on its own (spontaneous pneumothorax) or from trauma to the chest. °· Shingles infection (herpes zoster virus). °The chest wall is composed of bones, muscles, and cartilage. Any of these can be the source of the pain. °· The bones can be bruised by injury. °· The muscles or cartilage can be strained by coughing or overwork. °· The cartilage can be affected by inflammation and become sore (costochondritis). °DIAGNOSIS  °Lab tests or other studies may be needed to find the cause of your pain. Your health care provider may have you take a test called an ambulatory electrocardiogram (ECG). An ECG records your heartbeat patterns over a 24-hour period. You may also have other tests, such as: °· Transthoracic echocardiogram (TTE). During echocardiography, sound waves are used to evaluate how blood flows through your heart. °· Transesophageal echocardiogram (TEE). °· Cardiac monitoring. This allows your health care provider to monitor your heart rate and rhythm in real time. °· Holter monitor. This is a portable device that records your heartbeat and can help diagnose heart arrhythmias. It allows your health care provider to track your heart activity for several days, if needed. °· Stress tests by exercise or by giving medicine that makes the heart beat faster. °TREATMENT  °· Treatment depends on what may be causing your chest pain. Treatment may include: °¨ Acid blockers for  heartburn. °¨ Anti-inflammatory medicine. °¨ Pain medicine for inflammatory conditions. °¨ Antibiotics if an infection is present. °· You may be advised to change lifestyle habits. This includes stopping smoking and avoiding alcohol, caffeine, and chocolate. °· You may be advised to keep your head raised (elevated) when sleeping. This reduces the chance of acid going backward from your stomach into your esophagus. °Most of the time, nonspecific chest pain will improve within 2-3 days with rest and mild pain medicine.  °HOME CARE INSTRUCTIONS  °· If antibiotics were prescribed, take them as directed. Finish them even if you start to feel better. °· For the next few days, avoid physical activities that bring on chest pain. Continue physical activities as directed. °· Do not use any tobacco products, including cigarettes, chewing tobacco, or electronic cigarettes. °· Avoid drinking alcohol. °· Only take medicine as directed by your health care provider. °· Follow your health care provider's suggestions for further testing if your chest pain does not go away. °· Keep any follow-up appointments you made. If you do not go to an appointment, you could develop lasting (chronic) problems with pain. If there is any problem keeping an appointment, call to reschedule. °SEEK MEDICAL CARE IF:  °· Your chest pain does not go away, even after treatment. °· You have a rash with blisters on your chest. °· You have a fever. °SEEK IMMEDIATE MEDICAL CARE IF:  °· You have increased chest pain or pain that spreads to your arm, neck, jaw, back, or abdomen. °· You have shortness of breath. °· You have an increasing cough, or you cough   up blood.  You have severe back or abdominal pain.  You feel nauseous or vomit.  You have severe weakness.  You faint.  You have chills. This is an emergency. Do not wait to see if the pain will go away. Get medical help at once. Call your local emergency services (911 in [place] Do not drive  yourself to the hospital. MAKE SURE YOU:   Understand these instructions.  Will watch your condition.  Will get help right away if you are not doing well or get worse. Document Released: 04/24/2005 Document Revised: 07/20/2013 Document Reviewed: 02/18/2008 [facility] Patient Information 2015 [place], [place]. This information is not intended to replace advice given to you by your health care provider. Make sure you discuss any questions you have with your health care provider. Today, your cardiac enzymes, are negative.  Your EKG, is normal.  Chest x-ray is normal, as well.  You did receive significant relief of your epigastric discomfort with a GI cocktail, I have prescribed a medication called, Carafate, that I want you to use in conjunction with your other medicines.  You are to take this 30 minutes prior to meals and bedtime.  You've also been given a referral to gastroenterology to further evaluate.  Your symptoms

## 2014-04-14 ENCOUNTER — Encounter: Payer: Self-pay | Admitting: Interventional Cardiology

## 2014-04-14 ENCOUNTER — Ambulatory Visit (INDEPENDENT_AMBULATORY_CARE_PROVIDER_SITE_OTHER): Payer: 59 | Admitting: Interventional Cardiology

## 2014-04-14 VITALS — BP 100/80 | HR 66 | Ht 66.0 in | Wt 256.0 lb

## 2014-04-14 DIAGNOSIS — R072 Precordial pain: Secondary | ICD-10-CM | POA: Insufficient documentation

## 2014-04-14 DIAGNOSIS — Z8249 Family history of ischemic heart disease and other diseases of the circulatory system: Secondary | ICD-10-CM

## 2014-04-14 DIAGNOSIS — I10 Essential (primary) hypertension: Secondary | ICD-10-CM

## 2014-04-14 NOTE — Patient Instructions (Signed)
Your physician recommends that you schedule a follow-up appointment as needed  

## 2014-04-14 NOTE — Progress Notes (Signed)
Patient ID: Grace Pennington, female   DOB: 1970-10-02, 43 y.o.   MRN: [id number] Patient ID: Grace Pennington, female   DOB: August 08, 1970, 43 y.o.   MRN: [id number]     Patient ID: Grace Pennington MRN: [id number] DOB/AGE: 04/20/1971 43 y.o.   Referring Physician Dr. Delfina Redwood   Reason for Consultation chest pain  HPI: 43 y/o with a family h/o CAD.  Mother had an MI at age 42.  Around Christmas, she started having CP daily.  Episodes feel like a sharp stabbing pain.  It will be in the center of her chest and in her back.  Not related to activity.  She went to the ER after having a hot flash with the CP.  W/u was negative.  Walking stairs cuases no problems.  She was given narcotis in the ER for atypical chest pain but has not taken this.  BP at home is usually well controled.    Current Outpatient Prescriptions  Medication Sig Dispense Refill  . hydrochlorothiazide (HYDRODIURIL) 25 MG tablet Take 25 mg by mouth daily.      Marland Kitchen ibuprofen (ADVIL,MOTRIN) 600 MG tablet Take 1 tablet (600 mg total) by mouth every 6 (six) hours as needed.  30 tablet  0  . pantoprazole (PROTONIX) 40 MG tablet Take 40 mg by mouth daily.       No current facility-administered medications for this visit.   Past Medical History  Diagnosis Date  . Hypertension   . Palpitations     Family History  Problem Relation Age of Onset  . Diabetes    . Stroke    . Hypertension    . Kidney failure    . Coronary artery disease      History   Social History  . Marital Status: Single    Spouse Name: N/A    Number of Children: 2  . Years of Education: N/A   Occupational History  . medical records [place] History Main Topics  . Smoking status: Former Smoker    Quit date: 07/29/1996  . Smokeless tobacco: Not on file  . Alcohol Use: No  . Drug Use: No  . Sexual Activity: Yes    Birth Control/ Protection: Surgical   Other Topics Concern  . Not on file   Social History Narrative  . No narrative on file    Past Surgical History  Procedure Laterality Date  . Tubal ligation        (Not in a hospital admission)  Review of systems complete and found to be negative unless listed above .  No nausea, vomiting.  No fever chills, No focal weakness,  No palpitations.  Physical Exam: Filed Vitals:   04/14/14 1105  BP: 100/80  Pulse: 66    Weight: 256 lb (116.121 kg)  Physical exam: [place]/AT EOMI No JVD, No carotid bruit RRR S1S2  No wheezing Soft. NT, nondistended No edema. No focal motor or sensory deficits Normal affect  Labs:   Lab Results  Component Value Date   WBC 8.5 02/20/2014   HGB 12.8 02/20/2014   HCT 40.0 02/20/2014   MCV 83.0 02/20/2014   PLT 331 02/20/2014   No results found for this basename: NA, K, CL, CO2, BUN, CREATININE, CALCIUM, LABALBU, PROT, BILITOT, ALKPHOS, ALT, AST, GLUCOSE,  in the last 168 hours No results found for this basename: CKTOTAL,  CKMB,  CKMBINDEX,  TROPONINI    No results found for this basename: CHOL   No  results found for this basename: HDL   No results found for this basename: LDLCALC   No results found for this basename: TRIG   No results found for this basename: CHOLHDL   No results found for this basename: LDLDIRECT      Radiology: No pulmonary edema EKG: NSR, NSST  ASSESSMENT AND PLAN:  1. chest pain: Several atypical features. She does have a family history of heart disease. Negative exercise treadmill test in 3/15.  She had 2 episodes of atypical CP at night.  Negative w/u in ER- no change with NTG, sharp pain.  I personally reviewed the ER records. CP less frequent on CPAP.  No problems while walking stairs.  I don't think this is cardiac.  Sx improving.  No further testing at this time.  If sx, get worse, she will let us know.   2. Family h/o CAD: Mother with MI at age 31.  Lipids have been well controlled. Last LDL was 113. 3. HTN:  Blood pressures at home are typically controlled. Continue to monitor.  Normal in ER as well.    Signed:   Mina Marble, MD, [facility] 04/14/2014, 11:29 AM

## 2014-04-27 ENCOUNTER — Other Ambulatory Visit (HOSPITAL_COMMUNITY): Payer: Self-pay | Admitting: Obstetrics and Gynecology

## 2014-04-27 DIAGNOSIS — Z1231 Encounter for screening mammogram for malignant neoplasm of breast: Secondary | ICD-10-CM

## 2014-04-29 ENCOUNTER — Ambulatory Visit (HOSPITAL_COMMUNITY)
Admission: RE | Admit: 2014-04-29 | Discharge: 2014-04-29 | Disposition: A | Discharge status: Home / Self Care | Payer: 59 | Source: Ambulatory Visit | Attending: Obstetrics and Gynecology | Admitting: Obstetrics and Gynecology

## 2014-04-29 DIAGNOSIS — Z1231 Encounter for screening mammogram for malignant neoplasm of breast: Secondary | ICD-10-CM | POA: Diagnosis not present

## 2015-08-03 MED FILL — HYDROCHLOROTHIAZIDE 25 MG T: 25 | 90 days supply | Qty: 90 | Fill #2

## 2015-09-19 MED FILL — PANTOPRAZOLE SOD DR 40 MG T: 40 | 90 days supply | Qty: 90 | Fill #1

## 2015-10-06 DIAGNOSIS — R7301 Impaired fasting glucose: Secondary | ICD-10-CM | POA: Diagnosis not present

## 2015-11-03 MED FILL — HYDROCHLOROTHIAZIDE 25 MG T: 25 | 90 days supply | Qty: 90 | Fill #3

## 2016-01-08 DIAGNOSIS — J301 Allergic rhinitis due to pollen: Secondary | ICD-10-CM | POA: Diagnosis not present

## 2016-01-18 MED FILL — PANTOPRAZOLE SOD DR 40 MG T: 40 | 90 days supply | Qty: 90 | Fill #2

## 2016-02-05 MED FILL — HYDROCHLOROTHIAZIDE 25 MG T: 25 | 90 days supply | Qty: 90 | Fill #0

## 2016-02-08 DIAGNOSIS — R7301 Impaired fasting glucose: Secondary | ICD-10-CM | POA: Diagnosis not present

## 2016-02-08 DIAGNOSIS — Z6841 Body Mass Index (BMI) 40.0 and over, adult: Secondary | ICD-10-CM | POA: Diagnosis not present

## 2016-02-08 DIAGNOSIS — E663 Overweight: Secondary | ICD-10-CM | POA: Diagnosis not present

## 2016-02-08 DIAGNOSIS — Z23 Encounter for immunization: Secondary | ICD-10-CM | POA: Diagnosis not present

## 2016-02-08 DIAGNOSIS — I10 Essential (primary) hypertension: Secondary | ICD-10-CM | POA: Diagnosis not present

## 2016-02-08 DIAGNOSIS — Z0000 Encounter for general adult medical examination without abnormal findings: Secondary | ICD-10-CM | POA: Diagnosis not present

## 2016-02-08 DIAGNOSIS — G4730 Sleep apnea, unspecified: Secondary | ICD-10-CM | POA: Diagnosis not present

## 2016-03-21 DIAGNOSIS — R350 Frequency of micturition: Secondary | ICD-10-CM | POA: Diagnosis not present

## 2016-03-21 DIAGNOSIS — Z1151 Encounter for screening for human papillomavirus (HPV): Secondary | ICD-10-CM | POA: Diagnosis not present

## 2016-03-21 DIAGNOSIS — Z01419 Encounter for gynecological examination (general) (routine) without abnormal findings: Secondary | ICD-10-CM | POA: Diagnosis not present

## 2016-03-21 DIAGNOSIS — Z113 Encounter for screening for infections with a predominantly sexual mode of transmission: Secondary | ICD-10-CM | POA: Diagnosis not present

## 2016-03-21 DIAGNOSIS — Z114 Encounter for screening for human immunodeficiency virus [HIV]: Secondary | ICD-10-CM | POA: Diagnosis not present

## 2016-03-21 DIAGNOSIS — Z1159 Encounter for screening for other viral diseases: Secondary | ICD-10-CM | POA: Diagnosis not present

## 2016-03-21 DIAGNOSIS — Z6841 Body Mass Index (BMI) 40.0 and over, adult: Secondary | ICD-10-CM | POA: Diagnosis not present

## 2016-03-21 DIAGNOSIS — R3915 Urgency of urination: Secondary | ICD-10-CM | POA: Diagnosis not present

## 2016-03-21 DIAGNOSIS — Z1231 Encounter for screening mammogram for malignant neoplasm of breast: Secondary | ICD-10-CM | POA: Diagnosis not present

## 2016-04-04 DIAGNOSIS — F419 Anxiety disorder, unspecified: Secondary | ICD-10-CM | POA: Diagnosis not present

## 2016-04-09 MED FILL — ALPRAZolam 0.25 MG TABS: 0.25 | 30 days supply | Qty: 15 | Fill #0

## 2016-05-08 MED FILL — HYDROCHLOROTHIAZIDE 25 MG T: 25 | 90 days supply | Qty: 90 | Fill #1

## 2016-05-08 MED FILL — PANTOPRAZOLE SOD DR 40 MG T: 40 | 90 days supply | Qty: 90 | Fill #0

## 2016-06-12 DIAGNOSIS — R7301 Impaired fasting glucose: Secondary | ICD-10-CM | POA: Diagnosis not present

## 2016-07-04 ENCOUNTER — Emergency Department (HOSPITAL_COMMUNITY): Payer: 59

## 2016-07-04 ENCOUNTER — Encounter (HOSPITAL_COMMUNITY): Payer: Self-pay | Admitting: Emergency Medicine

## 2016-07-04 ENCOUNTER — Emergency Department (HOSPITAL_COMMUNITY)
Admission: EM | Admit: 2016-07-04 | Discharge: 2016-07-04 | Disposition: A | Discharge status: Home / Self Care | Payer: 59 | Attending: Emergency Medicine | Admitting: Emergency Medicine

## 2016-07-04 DIAGNOSIS — R0789 Other chest pain: Secondary | ICD-10-CM | POA: Diagnosis not present

## 2016-07-04 DIAGNOSIS — Z87891 Personal history of nicotine dependence: Secondary | ICD-10-CM | POA: Insufficient documentation

## 2016-07-04 DIAGNOSIS — R079 Chest pain, unspecified: Secondary | ICD-10-CM | POA: Diagnosis not present

## 2016-07-04 DIAGNOSIS — I10 Essential (primary) hypertension: Secondary | ICD-10-CM | POA: Diagnosis not present

## 2016-07-04 LAB — BASIC METABOLIC PANEL
Anion gap: 7 (ref 5–15)
BUN: 18 mg/dL (ref 6–20)
CO2: 28 mmol/L (ref 22–32)
Calcium: 9.1 mg/dL (ref 8.9–10.3)
Chloride: 104 mmol/L (ref 101–111)
Creatinine, Ser: 1.05 mg/dL — ABNORMAL HIGH (ref 0.44–1.00)
GFR calc Af Amer: >60 mL/min (ref >60)
GFR calc non Af Amer: >60 mL/min (ref >60)
Glucose, Bld: 107 mg/dL — ABNORMAL HIGH (ref 65–99)
Potassium: 3.4 mmol/L — ABNORMAL LOW (ref 3.5–5.1)
Sodium: 139 mmol/L (ref 135–145)

## 2016-07-04 LAB — CBC
HCT: 36.4 % (ref 36.0–46.0)
Hemoglobin: 11.7 g/dL — ABNORMAL LOW (ref 12.0–15.0)
MCH: 25.6 pg — ABNORMAL LOW (ref 26.0–34.0)
MCHC: 32.1 g/dL (ref 30.0–36.0)
MCV: 79.6 fL (ref 78.0–100.0)
Platelets: 340 10*3/uL (ref 150–400)
RBC: 4.57 MIL/uL (ref 3.87–5.11)
RDW: 16.3 % — ABNORMAL HIGH (ref 11.5–15.5)
WBC: 5.7 10*3/uL (ref 4.0–10.5)

## 2016-07-04 LAB — I-STAT TROPONIN, ED
Troponin i, poc: 0 ng/mL (ref 0.00–0.08)
Troponin i, poc: 0 ng/mL (ref 0.00–0.08)

## 2016-07-04 MED ORDER — ASPIRIN 81 MG PO CHEW
324.0000 mg | CHEWABLE_TABLET | Freq: Once | ORAL | Status: AC
  Administered 2016-07-04: 324 mg via ORAL
  Filled 2016-07-04: qty 4

## 2016-07-04 MED ORDER — GI COCKTAIL ~~LOC~~
30.0000 mL | Freq: Once | ORAL | Status: AC
  Administered 2016-07-04: 30 mL via ORAL
  Filled 2016-07-04: qty 30

## 2016-07-04 NOTE — ED Triage Notes (Addendum)
Pt c/o intermittent chest heaviness, blurred vision, SOB, "like something is sitting on my chest," left arm, jaw, abdomen pain onset 2 days ago. Worse with exertion.  Mom, Dad, brother had heart attacks at young age, brother at age 44.

## 2016-07-04 NOTE — ED Provider Notes (Signed)
[facility] DEPT Provider Note   CSN: [id number] Arrival date & time: 07/04/16  [id number]     History   Chief Complaint Chief Complaint  Patient presents with  . Chest Pain    HPI Grace Pennington is a 45 y.o. female.  Grace Pennington is a 45 y.o. Female with history of hypertension who presents to the emergency department complaining of waxing and waning chest pain for the past 3 days. Patient reports she began having left-sided chest pain that radiated to her left arm and jaw about 3 days ago. She reports this improved with some aspirin. She reports this morning around 4 AM she woke up with worsening chest pain to her left chest. She also reports some slight left upper quadrant abdominal pain and nausea. She reports this abdominal pain and nausea has resolved. She also reports some slight shortness of breath and chest heaviness. She is concerned as her brother had a heart attack at the age of 54 and parents both had MIs younger than 16. She's had nothing for treatment of her symptoms today. She reports her pain seems to be worse with getting dressed. Patient denies personal history of MI. She has had a previous stress test 2 years ago that was unremarkable. She is unsure about the cardiologist she saw previously. Patient denies personal or close family history of DVT or PE. She denies recent long travel or endogenous estrogen use. She is a former smoker. Patient denies fevers, cough, wheezing, hemoptysis, vomiting, diarrhea, rashes, leg pain, leg swelling, palpitations, lightheadedness or dizziness.    The history is provided by the patient and medical records. No language interpreter was used.  Chest Pain   Associated symptoms include shortness of breath. Pertinent negatives include no abdominal pain, no back pain, no cough, no fever, no headaches, no nausea, no palpitations, no vomiting and no weakness.    Past Medical History:  Diagnosis Date  . Hypertension   . Palpitations      Patient Active Problem List   Diagnosis Date Noted  . Precordial pain 04/14/2014  . Essential hypertension, benign 08/03/2013  . Family history of ischemic heart disease 08/03/2013    Past Surgical History:  Procedure Laterality Date  . TUBAL LIGATION      OB History    No data available       Home Medications    Prior to Admission medications   Medication Sig Start Date End Date Taking? Authorizing Provider  hydrochlorothiazide (HYDRODIURIL) 25 MG tablet Take 25 mg by mouth daily.   Yes Historical Provider, MD  pantoprazole (PROTONIX) 40 MG tablet Take 40 mg by mouth daily.   Yes Historical Provider, MD    Family History Family History  Problem Relation Age of Onset  . Diabetes    . Stroke    . Hypertension    . Kidney failure    . Coronary artery disease    . Heart disease Mother     Social History Social History  Substance Use Topics  . Smoking status: Former Smoker    Quit date: 07/29/1996  . Smokeless tobacco: Not on file  . Alcohol use No     Allergies   Patient has no known allergies.   Review of Systems Review of Systems  Constitutional: Negative for chills and fever.  HENT: Negative for congestion and sore throat.   Eyes: Negative for visual disturbance.  Respiratory: Positive for shortness of breath. Negative for cough and wheezing.   Cardiovascular: Positive for  chest pain. Negative for palpitations and leg swelling.  Gastrointestinal: Negative for abdominal pain, diarrhea, nausea and vomiting.  Genitourinary: Negative for dysuria.  Musculoskeletal: Negative for back pain and neck pain.  Skin: Negative for rash.  Neurological: Negative for syncope, weakness, light-headedness and headaches.     Physical Exam Updated Vital Signs BP 109/86   Pulse 66   Temp 98.3 F (36.8 C)   Resp 19   LMP 06/11/2016   SpO2 96%   Physical Exam  Constitutional: She is oriented to person, place, and time. She appears well-developed and  well-nourished. No distress.  Nontoxic-appearing. Obese female.  HENT:  Head: Normocephalic and atraumatic.  Right Ear: External ear normal.  Left Ear: External ear normal.  Mouth/Throat: Oropharynx is clear and moist.  Eyes: Conjunctivae are normal. Pupils are equal, round, and reactive to light. Right eye exhibits no discharge. Left eye exhibits no discharge.  Neck: Normal range of motion. Neck supple. No JVD present. No tracheal deviation present.  Cardiovascular: Normal rate, regular rhythm, normal heart sounds and intact distal pulses.  Exam reveals no gallop and no friction rub.   No murmur heard. Bilateral radial, posterior tibialis and dorsalis pedis pulses are intact.    Pulmonary/Chest: Effort normal and breath sounds normal. No stridor. No respiratory distress. She has no wheezes. She has no rales. She exhibits no tenderness.  Lungs are clear auscultation bilaterally symmetric chest expansion bilaterally. No chest wall tenderness to palpation.  Abdominal: Soft. There is no tenderness. There is no guarding.  Abdomen is soft and nontender to palpation.  Musculoskeletal: She exhibits no edema or tenderness.  No lower extremity edema or tenderness.  Lymphadenopathy:    She has no cervical adenopathy.  Neurological: She is alert and oriented to person, place, and time. No cranial nerve deficit. Coordination normal.  Sensation is intact to her bilateral upper and lower extremities.  Skin: Skin is warm and dry. Capillary refill takes less than 2 seconds. No rash noted. She is not diaphoretic. No erythema. No pallor.  Psychiatric: She has a normal mood and affect. Her behavior is normal.  Nursing note and vitals reviewed.    ED Treatments / Results  Labs (all labs ordered are listed, but only abnormal results are displayed) Labs Reviewed  BASIC METABOLIC PANEL - Abnormal; Notable for the following:       Result Value   Potassium 3.4 (*)    Glucose, Bld 107 (*)    Creatinine,  Ser 1.05 (*)    All other components within normal limits  CBC - Abnormal; Notable for the following:    Hemoglobin 11.7 (*)    MCH 25.6 (*)    RDW 16.3 (*)    All other components within normal limits  I-STAT TROPOININ, ED  I-STAT TROPOININ, ED    EKG  EKG Interpretation  Date/Time:  Thursday July 04 2016 08:41:09 EST Ventricular Rate:  71 PR Interval:    QRS Duration: 91 QT Interval:  394 QTC Calculation: 429 R Axis:   37 Text Interpretation:  Sinus rhythm Borderline T wave abnormalities since last tracing no significant change Confirmed by Eulis Foster  MD, ELLIOTT [phone]) on 07/04/2016 9:39:07 AM       Radiology Dg Chest 2 View  Result Date: 07/04/2016 CLINICAL DATA:  Worsening of mid chest pain, chest heaviness over the last 2 days EXAM: CHEST  2 VIEW COMPARISON:  Chest x-ray of 02/20/2014 FINDINGS: No active infiltrate or effusion is seen. Mediastinal and hilar contours are unremarkable.  The heart is within normal limits in size. No bony abnormality is seen. IMPRESSION: No active cardiopulmonary disease. Electronically Signed   By: Ivar Drape M.D.   On: 07/04/2016 09:14    Procedures Procedures (including critical care time)  Medications Ordered in ED Medications  aspirin chewable tablet 324 mg (324 mg Oral Given 07/04/16 1004)  gi cocktail (Maalox,Lidocaine,Donnatal) (30 mLs Oral Given 07/04/16 1006)     Initial Impression / Assessment and Plan / ED Course  I have reviewed the triage vital signs and the nursing notes.  Pertinent labs & imaging results that were available during my care of the patient were reviewed by me and considered in my medical decision making (see chart for details).  Clinical Course    This is a 45 y.o. Female with history of hypertension who presents to the emergency department complaining of waxing and waning chest pain for the past 3 days. Patient reports she began having left-sided chest pain that radiated to her left arm and jaw about 3  days ago. She reports this improved with some aspirin. She reports this morning around 4 AM she woke up with worsening chest pain to her left chest. She also reports some slight left upper quadrant abdominal pain and nausea. She reports this abdominal pain and nausea has resolved. She also reports some slight shortness of breath and chest heaviness. She is concerned as her brother had a heart attack at the age of 22 and parents both had MIs younger than 81. She's had nothing for treatment of her symptoms today. She reports her pain seems to be worse with getting dressed. Patient denies personal history of MI. She has had a previous stress test 2 years ago that was unremarkable. She is unsure about the cardiologist she saw previously.  Patient presented with chest pain to the ED. Patient is to be discharged with recommendation to follow up with PCP in regards to today's hospital visit. Chest pain is not likely of cardiac or pulmonary etiology due to presentation, perc negative, VSS, no tracheal deviation, no JVD or new murmur, RRR, breath sounds equal bilaterally, EKG without acute abnormalities, negative troponin, and delta troponin, and negative CXR. HEART score is 3. Patient tells me her chest pain has been constant since arrival. She does report after taking the GI cocktail it eased up somewhat and then returned. She tells me she does take the acid reflux medicine daily and does not want a prescription for some period Patient has been advised to return to the ED if chest pain becomes exertional, associated with diaphoresis or nausea, radiates to left jaw/arm, worsens or becomes concerning in any way. Patient appears reliable for follow up and is agreeable to discharge. I encouraged her to follow-up with cardiology for possible stress test. I advised the patient to follow-up with their primary care provider this week. I advised the patient to return to the emergency department with new or worsening symptoms or  new concerns. The patient verbalized understanding and agreement with plan.    This patient was discussed with Dr. Eulis Foster who agrees with assessment and plan.     Final Clinical Impressions(s) / ED Diagnoses   Final diagnoses:  Nonspecific chest pain    New Prescriptions New Prescriptions   No medications on file         Waynetta Pean, PA-C 07/04/16 [place], MD 07/04/16 1635

## 2016-07-04 NOTE — ED Notes (Signed)
Patient d/c'd self care.  F/U reviewed with patient and discussed PCP call x2 days.  Patient verbalized understanding.

## 2016-07-08 DIAGNOSIS — F419 Anxiety disorder, unspecified: Secondary | ICD-10-CM | POA: Diagnosis not present

## 2016-07-08 DIAGNOSIS — F410 Panic disorder [episodic paroxysmal anxiety] without agoraphobia: Secondary | ICD-10-CM | POA: Diagnosis not present

## 2016-07-08 MED FILL — CITALOPRAM HBR 10 MG TABLET: 10 | 30 days supply | Qty: 30 | Fill #0

## 2016-07-09 MED FILL — ALPRAZolam 0.25 MG TABS: 0.25 | 30 days supply | Qty: 30 | Fill #0

## 2016-08-16 MED FILL — HYDROCHLOROTHIAZIDE 25 MG T: 25 | 90 days supply | Qty: 90 | Fill #2

## 2016-08-16 MED FILL — PANTOPRAZOLE SOD DR 40 MG T: 40 | 90 days supply | Qty: 90 | Fill #1

## 2016-08-16 MED FILL — CITALOPRAM HBR 10 MG TABLET: 10 | 30 days supply | Qty: 30 | Fill #1

## 2016-09-19 MED FILL — CITALOPRAM HBR 10 MG TABLET: 10 | 30 days supply | Qty: 30 | Fill #2

## 2016-10-21 MED FILL — CITALOPRAM HBR 10 MG TABLET: 10 | 30 days supply | Qty: 30 | Fill #3

## 2016-11-18 MED FILL — PANTOPRAZOLE SOD DR 40 MG T: 40 | 90 days supply | Qty: 90 | Fill #2

## 2016-11-18 MED FILL — CITALOPRAM HBR 10 MG TABLET: 10 | 30 days supply | Qty: 30 | Fill #4

## 2016-11-18 MED FILL — HYDROCHLOROTHIAZIDE 25 MG T: 25 | 90 days supply | Qty: 90 | Fill #0

## 2016-12-25 MED FILL — CITALOPRAM HBR 10 MG TABLET: 10 | 30 days supply | Qty: 30 | Fill #5

## 2017-01-03 ENCOUNTER — Emergency Department (HOSPITAL_COMMUNITY): Payer: 59

## 2017-01-03 ENCOUNTER — Emergency Department (HOSPITAL_COMMUNITY)
Admission: EM | Admit: 2017-01-03 | Discharge: 2017-01-03 | Disposition: A | Discharge status: Home / Self Care | Payer: 59 | Attending: Emergency Medicine | Admitting: Emergency Medicine

## 2017-01-03 ENCOUNTER — Encounter (HOSPITAL_COMMUNITY): Payer: Self-pay

## 2017-01-03 DIAGNOSIS — Z79899 Other long term (current) drug therapy: Secondary | ICD-10-CM | POA: Insufficient documentation

## 2017-01-03 DIAGNOSIS — I10 Essential (primary) hypertension: Secondary | ICD-10-CM | POA: Insufficient documentation

## 2017-01-03 DIAGNOSIS — R0789 Other chest pain: Secondary | ICD-10-CM | POA: Insufficient documentation

## 2017-01-03 DIAGNOSIS — Z87891 Personal history of nicotine dependence: Secondary | ICD-10-CM | POA: Diagnosis not present

## 2017-01-03 DIAGNOSIS — R079 Chest pain, unspecified: Secondary | ICD-10-CM | POA: Diagnosis not present

## 2017-01-03 DIAGNOSIS — G4733 Obstructive sleep apnea (adult) (pediatric): Secondary | ICD-10-CM | POA: Diagnosis not present

## 2017-01-03 LAB — CBC
HCT: 35.2 % — ABNORMAL LOW (ref 36.0–46.0)
Hemoglobin: 11.4 g/dL — ABNORMAL LOW (ref 12.0–15.0)
MCH: 25.1 pg — ABNORMAL LOW (ref 26.0–34.0)
MCHC: 32.4 g/dL (ref 30.0–36.0)
MCV: 77.5 fL — ABNORMAL LOW (ref 78.0–100.0)
Platelets: 319 10*3/uL (ref 150–400)
RBC: 4.54 MIL/uL (ref 3.87–5.11)
RDW: 16.4 % — ABNORMAL HIGH (ref 11.5–15.5)
WBC: 7.4 10*3/uL (ref 4.0–10.5)

## 2017-01-03 LAB — BASIC METABOLIC PANEL
Anion gap: 8 (ref 5–15)
BUN: 15 mg/dL (ref 6–20)
CO2: 26 mmol/L (ref 22–32)
Calcium: 9 mg/dL (ref 8.9–10.3)
Chloride: 103 mmol/L (ref 101–111)
Creatinine, Ser: 0.96 mg/dL (ref 0.44–1.00)
GFR calc Af Amer: >60 mL/min (ref >60)
GFR calc non Af Amer: >60 mL/min (ref >60)
Glucose, Bld: 147 mg/dL — ABNORMAL HIGH (ref 65–99)
Potassium: 3.2 mmol/L — ABNORMAL LOW (ref 3.5–5.1)
Sodium: 137 mmol/L (ref 135–145)

## 2017-01-03 LAB — I-STAT TROPONIN, ED: Troponin i, poc: 0 ng/mL (ref 0.00–0.08)

## 2017-01-03 MED ORDER — GI COCKTAIL ~~LOC~~
30.0000 mL | Freq: Once | ORAL | Status: AC
  Administered 2017-01-03: 30 mL via ORAL
  Filled 2017-01-03: qty 30

## 2017-01-03 NOTE — ED Provider Notes (Signed)
[facility] DEPT Provider Note   CSN: [id number] Arrival date & time: 01/03/17  1553     History   Chief Complaint Chief Complaint  Patient presents with  . Chest Pain    HPI Grace Pennington is a 46 y.o. female.  The history is provided by the patient.  Chest Pain   This is a recurrent problem. The current episode started yesterday. The problem occurs constantly. The problem has not changed since onset.The pain is present in the lateral region (left). The pain is moderate. The quality of the pain is described as pressure-like. The pain radiates to the upper back. The symptoms are aggravated by certain positions. Associated symptoms include malaise/fatigue and shortness of breath. Pertinent negatives include no cough, no diaphoresis, no fever, no leg pain, no lower extremity edema, no nausea and no vomiting. Treatments tried: nsaid. Risk factors include obesity.  Her past medical history is significant for hypertension.  Pertinent negatives for past medical history include no CAD, no diabetes, no DVT, no hyperlipidemia, no MI, no PE, no strokes and no TIA.  Her family medical history is significant for early MI.  Procedure history is positive for exercise treadmill test (2015 - negative).  Procedure history is negative for cardiac catheterization.    Past Medical History:  Diagnosis Date  . Hypertension   . Palpitations     Patient Active Problem List   Diagnosis Date Noted  . Precordial pain 04/14/2014  . Essential hypertension, benign 08/03/2013  . Family history of ischemic heart disease 08/03/2013    Past Surgical History:  Procedure Laterality Date  . TUBAL LIGATION      OB History    No data available       Home Medications    Prior to Admission medications   Medication Sig Start Date End Date Taking? Authorizing Provider  citalopram (CELEXA) 10 MG tablet Take 10 mg by mouth daily. 12/25/16  Yes [provider]  hydrochlorothiazide (HYDRODIURIL)  25 MG tablet Take 25 mg by mouth daily.   Yes [provider]  pantoprazole (PROTONIX) 40 MG tablet Take 40 mg by mouth daily.   Yes [provider]    Family History Family History  Problem Relation Age of Onset  . Heart disease Mother   . Diabetes Unknown   . Stroke Unknown   . Hypertension Unknown   . Kidney failure Unknown   . Coronary artery disease Unknown     Social History Social History  Substance Use Topics  . Smoking status: Former Smoker    Quit date: 07/29/1996  . Smokeless tobacco: Never Used  . Alcohol use No     Allergies   Patient has no known allergies.   Review of Systems Review of Systems  Constitutional: Positive for malaise/fatigue. Negative for diaphoresis and fever.  Respiratory: Positive for shortness of breath. Negative for cough.   Cardiovascular: Positive for chest pain.  Gastrointestinal: Negative for nausea and vomiting.  All other systems are reviewed and are negative for acute change except as noted in the HPI    Physical Exam Updated Vital Signs BP (!) 119/91 (BP Location: Right Arm)   Pulse 89   Temp 98.2 F (36.8 C) (Oral)   Resp 17   Ht 5\' 6"  (1.676 m)   Wt 113.4 kg (250 lb)   LMP 11/26/2016 Comment: patient states she is premenopausal  SpO2 100%   BMI 40.35 kg/m   Physical Exam  Constitutional: She is oriented to person, place,  and time. She appears well-developed and well-nourished. No distress.  HENT:  Head: Normocephalic and atraumatic.  Nose: Nose normal.  Eyes: Conjunctivae and EOM are normal. Pupils are equal, round, and reactive to light. Right eye exhibits no discharge. Left eye exhibits no discharge. No scleral icterus.  Neck: Normal range of motion. Neck supple.  Cardiovascular: Normal rate and regular rhythm.  Exam reveals no gallop and no friction rub.   No murmur heard. Pulmonary/Chest: Effort normal and breath sounds normal. No stridor. No respiratory distress. She has no rales.    Abdominal: Soft. She exhibits no distension. There is no tenderness.  Musculoskeletal: She exhibits no edema or tenderness.  Neurological: She is alert and oriented to person, place, and time.  Skin: Skin is warm and dry. No rash noted. She is not diaphoretic. No erythema.  Psychiatric: She has a normal mood and affect.  Vitals reviewed.    ED Treatments / Results  Labs (all labs ordered are listed, but only abnormal results are displayed) Labs Reviewed  BASIC METABOLIC PANEL - Abnormal; Notable for the following:       Result Value   Potassium 3.2 (*)    Glucose, Bld 147 (*)    All other components within normal limits  CBC - Abnormal; Notable for the following:    Hemoglobin 11.4 (*)    HCT 35.2 (*)    MCV 77.5 (*)    MCH 25.1 (*)    RDW 16.4 (*)    All other components within normal limits  I-STAT TROPOININ, ED    EKG  EKG Interpretation  Date/Time:  Friday January 03 2017 15:58:46 EDT Ventricular Rate:  89 PR Interval:    QRS Duration: 87 QT Interval:  423 QTC Calculation: 515 R Axis:   41 Text Interpretation:  Sinus rhythm Abnormal R-wave progression, early transition Nonspecific T abnrm, anterolateral leads Prolonged QT interval No significant change since last tracing Confirmed by Addison Lank [phone]) on 01/03/2017 4:08:18 PM       Radiology Dg Chest 2 View  Result Date: 01/03/2017 CLINICAL DATA:  Chest pain EXAM: CHEST  2 VIEW COMPARISON:  July 04, 2016 FINDINGS: Slight elevation of left hemidiaphragm is stable. There is no edema or consolidation. The heart size and pulmonary vascularity are normal. No adenopathy. No bone lesions. IMPRESSION: No edema or consolidation. Electronically Signed   By: Lowella Grip III M.D.   On: 01/03/2017 16:47    Procedures Procedures (including critical care time)  Medications Ordered in ED Medications  gi cocktail (Maalox,Lidocaine,Donnatal) (30 mLs Oral Given 01/03/17 1712)     Initial Impression / Assessment and  Plan / ED Course  I have reviewed the triage vital signs and the nursing notes.  Pertinent labs & imaging results that were available during my care of the patient were reviewed by me and considered in my medical decision making (see chart for details).  Clinical Course as of Jan 04 1720  Fri Jan 03, 2017  1708 Highly atypical chest pain inconsistent with ACS. EKG without acute ischemic changes or evidence of pericarditis. Initial troponin from triage was negative. Given the fact that this pain is been ongoing for more than 18 hours, I feel that the single troponin should be sufficient to rule out ACS at this time.  Chest x-ray without evidence suggestive of pneumonia, pneumothorax, pneumomediastinum.  No abnormal contour of the mediastinum to suggest dissection. No evidence of acute injuries.  Low pretest probability for pulmonary embolism; PERC negative.  Presentation  is not classic for aortic dissection or esophageal perforation.  Patient does suffer from GERD, provided with GI cocktail.   The patient is safe for discharge with strict return precautions.    [PC]    Clinical Course User Index [PC] Cardama, Grayce Sessions, MD      Final Clinical Impressions(s) / ED Diagnoses   Final diagnoses:  Atypical chest pain   Disposition: Discharge  Condition: Good  I have discussed the results, Dx and Tx plan with the patient who expressed understanding and agree(s) with the plan. Discharge instructions discussed at great length. The patient was given strict return precautions who verbalized understanding of the instructions. No further questions at time of discharge.    New Prescriptions   No medications on file    Follow Up: Seward Carol, MD [address] [place] [address] [phone]  Schedule an appointment as soon as possible for a visit  in 3-5 days, If symptoms do not improve or  worsen      Cardama, Grayce Sessions, MD 01/03/17 1721

## 2017-01-03 NOTE — ED Triage Notes (Signed)
Patient reports that she began having left chest pain that radiates into the back at 2100 last night. Patient states she took Ibuprofen. Pain eased but continues. Patient states the chest pain is constant. Patient also c/o SOB and diaphoresis at times. Patient also c/o headache.

## 2017-01-08 DIAGNOSIS — F410 Panic disorder [episodic paroxysmal anxiety] without agoraphobia: Secondary | ICD-10-CM | POA: Diagnosis not present

## 2017-01-08 DIAGNOSIS — R0789 Other chest pain: Secondary | ICD-10-CM | POA: Diagnosis not present

## 2017-01-08 DIAGNOSIS — F419 Anxiety disorder, unspecified: Secondary | ICD-10-CM | POA: Diagnosis not present

## 2017-01-17 DIAGNOSIS — B029 Zoster without complications: Secondary | ICD-10-CM | POA: Diagnosis not present

## 2017-01-17 DIAGNOSIS — Z87891 Personal history of nicotine dependence: Secondary | ICD-10-CM | POA: Diagnosis not present

## 2017-01-30 MED FILL — CITALOPRAM HBR 10 MG TABLET: 10 | 30 days supply | Qty: 30 | Fill #6

## 2017-02-21 DIAGNOSIS — Z6841 Body Mass Index (BMI) 40.0 and over, adult: Secondary | ICD-10-CM | POA: Diagnosis not present

## 2017-02-21 DIAGNOSIS — G4730 Sleep apnea, unspecified: Secondary | ICD-10-CM | POA: Diagnosis not present

## 2017-02-21 DIAGNOSIS — Z0000 Encounter for general adult medical examination without abnormal findings: Secondary | ICD-10-CM | POA: Diagnosis not present

## 2017-02-21 DIAGNOSIS — Z136 Encounter for screening for cardiovascular disorders: Secondary | ICD-10-CM | POA: Diagnosis not present

## 2017-02-21 DIAGNOSIS — F410 Panic disorder [episodic paroxysmal anxiety] without agoraphobia: Secondary | ICD-10-CM | POA: Diagnosis not present

## 2017-02-21 DIAGNOSIS — R7301 Impaired fasting glucose: Secondary | ICD-10-CM | POA: Diagnosis not present

## 2017-02-21 DIAGNOSIS — I10 Essential (primary) hypertension: Secondary | ICD-10-CM | POA: Diagnosis not present

## 2017-02-21 DIAGNOSIS — E663 Overweight: Secondary | ICD-10-CM | POA: Diagnosis not present

## 2017-02-27 MED FILL — HYDROCHLOROTHIAZIDE 25 MG T: 25 | 90 days supply | Qty: 90 | Fill #1

## 2017-03-05 MED FILL — PANTOPRAZOLE SOD DR 40 MG T: 40 | 30 days supply | Qty: 30 | Fill #3

## 2017-03-05 MED FILL — CITALOPRAM HBR 10 MG TABLET: 10 | 90 days supply | Qty: 90 | Fill #0

## 2017-04-08 DIAGNOSIS — Z113 Encounter for screening for infections with a predominantly sexual mode of transmission: Secondary | ICD-10-CM | POA: Diagnosis not present

## 2017-04-08 DIAGNOSIS — Z114 Encounter for screening for human immunodeficiency virus [HIV]: Secondary | ICD-10-CM | POA: Diagnosis not present

## 2017-04-08 DIAGNOSIS — Z01419 Encounter for gynecological examination (general) (routine) without abnormal findings: Secondary | ICD-10-CM | POA: Diagnosis not present

## 2017-04-08 DIAGNOSIS — Z1231 Encounter for screening mammogram for malignant neoplasm of breast: Secondary | ICD-10-CM | POA: Diagnosis not present

## 2017-04-08 DIAGNOSIS — Z6841 Body Mass Index (BMI) 40.0 and over, adult: Secondary | ICD-10-CM | POA: Diagnosis not present

## 2017-04-08 DIAGNOSIS — Z1159 Encounter for screening for other viral diseases: Secondary | ICD-10-CM | POA: Diagnosis not present

## 2017-04-08 DIAGNOSIS — Z1151 Encounter for screening for human papillomavirus (HPV): Secondary | ICD-10-CM | POA: Diagnosis not present

## 2017-04-15 MED FILL — PANTOPRAZOLE SOD DR 40 MG T: 40 | 90 days supply | Qty: 90 | Fill #0

## 2017-06-03 DIAGNOSIS — L918 Other hypertrophic disorders of the skin: Secondary | ICD-10-CM | POA: Diagnosis not present

## 2017-06-03 DIAGNOSIS — L821 Other seborrheic keratosis: Secondary | ICD-10-CM | POA: Diagnosis not present

## 2017-06-16 MED FILL — HYDROCHLOROTHIAZIDE 25 MG T: 25 | 90 days supply | Qty: 90 | Fill #0

## 2017-06-25 MED FILL — ALPRAZolam 0.25 MG TABS: 0.25 | 30 days supply | Qty: 30 | Fill #0

## 2017-07-01 MED FILL — CITALOPRAM HBR 10 MG TABLET: 10 | 90 days supply | Qty: 90 | Fill #1

## 2017-08-07 ENCOUNTER — Encounter: Payer: Self-pay | Admitting: Nurse Practitioner

## 2017-08-07 ENCOUNTER — Ambulatory Visit: Payer: Self-pay

## 2017-08-07 ENCOUNTER — Ambulatory Visit: Payer: Self-pay | Admitting: Nurse Practitioner

## 2017-08-07 VITALS — BP 130/84 | HR 96 | Temp 98.4°F | Resp 18 | Wt 254.8 lb

## 2017-08-07 DIAGNOSIS — N760 Acute vaginitis: Secondary | ICD-10-CM

## 2017-08-07 MED ORDER — FLUCONAZOLE 150 MG PO TABS: 150.0000 mg | ORAL_TABLET | ORAL | 0 refills | Status: AC

## 2017-08-07 NOTE — Progress Notes (Signed)
Subjective:     Grace Pennington is a 47 y.o. female who presents for evaluation of an abnormal vaginal discharge. Symptoms have been present for 5 days. Vaginal symptoms: vulvar itching and irritation. Contraception: none. She denies odor and urinary symptoms of chills, cloudy urine, dysuria, lower abdominal pain, urinary frequency, urinary hesitancy, urinary urgency and vomiting Sexually transmitted infection risk: very low risk of STD exposure and patient admits to being in a monogomous relationship.. Menstrual flow: patient states she is peri-menopausal.  The following portions of the patient's history were reviewed and updated as appropriate: allergies, current medications and past medical history.   Review of Systems Constitutional: negative Respiratory: negative Cardiovascular: negative Gastrointestinal: negative Genitourinary:positive for vaginal discharge and clear discharge, negative for genital lesions and sexual problems, dysuria, hematuria, hesitancy and urinary incontinence Behavioral/Psych: negative    Objective:    BP 130/84 (BP Location: Right Arm, Patient Position: Sitting, Cuff Size: Normal)   Pulse 96   Temp 98.4 F (36.9 C) (Oral)   Resp 18   Wt 254 lb 12.8 oz (115.6 kg)   SpO2 96%   BMI 41.13 kg/m  General appearance: alert, cooperative and no distress Head: Normocephalic, without obvious abnormality, atraumatic Lungs: clear to auscultation bilaterally Heart: regular rate and rhythm, S1, S2 normal, no murmur, click, rub or gallop Abdomen: soft, non-tender; bowel sounds normal; no masses,  no organomegaly Pelvic: no adnexal masses or tenderness, no cervical motion tenderness and positive findings: vaginal discharge:  clear and odorless Pulses: 2+ and symmetric Skin: Skin color, texture, turgor normal. No rashes or lesions Neurologic: Grossly normal    Assessment:    Vulvovaginitis.    Plan:    Educational materials distributed. Oral antibiotics see  orders. Abstinence from intercourse discussed. Partner notification discussed. Repeat Diflucan in three days if needed.  Warm sitz baths.  Follow up as needed.

## 2017-08-07 NOTE — Patient Instructions (Addendum)
Vaginitis Vaginitis is a condition in which the vaginal tissue swells and becomes red (inflamed). This condition is most often caused by a change in the normal balance of bacteria and yeast that live in the vagina. This change causes an overgrowth of certain bacteria or yeast, which causes the inflammation. There are different types of vaginitis, but the most common types are:  Bacterial vaginosis.  Yeast infection (candidiasis).  Trichomoniasis vaginitis. This is a sexually transmitted disease (STD).  Viral vaginitis.  Atrophic vaginitis.  Allergic vaginitis.  What are the causes? The cause of this condition depends on the type of vaginitis. It can be caused by:  Bacteria (bacterial vaginosis).  Yeast, which is a fungus (yeast infection).  A parasite (trichomoniasis vaginitis).  A virus (viral vaginitis).  Low hormone levels (atrophic vaginitis). Low hormone levels can occur during pregnancy, breastfeeding, or after menopause.  Irritants, such as bubble baths, scented tampons, and feminine sprays (allergic vaginitis).  Other factors can change the normal balance of the yeast and bacteria that live in the vagina. These include:  Antibiotic medicines.  Poor hygiene.  Diaphragms, vaginal sponges, spermicides, birth control pills, and intrauterine devices (IUD).  Sex.  Infection.  Uncontrolled diabetes.  A weakened defense (immune) system.  What increases the risk? This condition is more likely to develop in women who:  Smoke.  Use vaginal douches, scented tampons, or scented sanitary pads.  Wear tight-fitting pants.  Wear thong underwear.  Use oral birth control pills or an IUD.  Have sex without a condom.  Have multiple sex partners.  Have an STD.  Frequently use the spermicide nonoxynol-9.  Eat lots of foods high in sugar.  Have uncontrolled diabetes.  Have low estrogen levels.  Have a weakened immune system from an immune disorder or medical  treatment.  Are pregnant or breastfeeding.  What are the signs or symptoms? Symptoms vary depending on the cause of the vaginitis. Common symptoms include:  Abnormal vaginal discharge. ? The discharge is white, gray, or yellow with bacterial vaginosis. ? The discharge is thick, white, and cheesy with a yeast infection. ? The discharge is frothy and yellow or greenish with trichomoniasis.  A bad vaginal smell. The smell is fishy with bacterial vaginosis.  Vaginal itching, pain, or swelling.  Sex that is painful.  Pain or burning when urinating.  Sometimes there are no symptoms. How is this diagnosed? This condition is diagnosed based on your symptoms and medical history. A physical exam, including a pelvic exam, will also be done. You may also have other tests, including:  Tests to determine the pH level (acidity or alkalinity) of your vagina.  A whiff test, to assess the odor that results when a sample of your vaginal discharge is mixed with a potassium hydroxide solution.  Tests of vaginal fluid. A sample will be examined under a microscope.  How is this treated? Treatment varies depending on the type of vaginitis you have. Your treatment may include:  Antibiotic creams or pills to treat bacterial vaginosis and trichomoniasis.  Antifungal medicines, such as vaginal creams or suppositories, to treat a yeast infection.  Medicine to ease discomfort if you have viral vaginitis. Your sexual partner should also be treated.  Estrogen delivered in a cream, pill, suppository, or vaginal ring to treat atrophic vaginitis. If vaginal dryness occurs, lubricants and moisturizing creams may help. You may need to avoid scented soaps, sprays, or douches.  Stopping use of a product that is causing allergic vaginitis. Then using a vaginal  cream to treat the symptoms.  Follow these instructions at home: Lifestyle  Keep your genital area clean and dry. Avoid soap, and only rinse the area  with water.  Do not douche or use tampons until your health care provider says it is okay to do so. Use sanitary pads, if needed.  Do not have sex until your health care provider approves. When you can return to sex, practice safe sex and use condoms.  Wipe from front to back. This avoids the spread of bacteria from the rectum to the vagina. General instructions  Take over-the-counter and prescription medicines only as told by your health care provider.  If you were prescribed an antibiotic medicine, take or use it as told by your health care provider. Do not stop taking or using the antibiotic even if you start to feel better.  Keep all follow-up visits as told by your health care provider. This is important. How is this prevented?  Use mild, non-scented products. Do not use things that can irritate the vagina, such as fabric softeners. Avoid the following products if they are scented: ? Feminine sprays. ? Detergents. ? Tampons. ? Feminine hygiene products. ? Soaps or bubble baths.  Let air reach your genital area. ? Wear cotton underwear to reduce moisture buildup. ? Avoid wearing underwear while you sleep. ? Avoid wearing tight pants and underwear or nylons without a cotton panel. ? Avoid wearing thong underwear.  Take off any wet clothing, such as bathing suits, as soon as possible.  Practice safe sex and use condoms. Contact a health care provider if:  You have abdominal pain.  You have a fever.  You have symptoms that last for more than 2-3 days. Get help right away if:  You have a fever and your symptoms suddenly get worse. Summary  Vaginitis is a condition in which the vaginal tissue becomes inflamed.This condition is most often caused by a change in the normal balance of bacteria and yeast that live in the vagina.  Treatment varies depending on the type of vaginitis you have.  Do not douche, use tampons , or have sex until your health care provider approves.  When you can return to sex, practice safe sex and use condoms. This information is not intended to replace advice given to you by your health care provider. Make sure you discuss any questions you have with your health care provider. Document Released: 05/12/2007 Document Revised: 08/20/2016 Document Reviewed: 08/20/2016 Elsevier Interactive Patient Education  2018 [employer].  Vaginitis Vaginitis is a condition in which the vaginal tissue swells and becomes red (inflamed). This condition is most often caused by a change in the normal balance of bacteria and yeast that live in the vagina. This change causes an overgrowth of certain bacteria or yeast, which causes the inflammation. There are different types of vaginitis, but the most common types are:  Bacterial vaginosis.  Yeast infection (candidiasis).  Trichomoniasis vaginitis. This is a sexually transmitted disease (STD).  Viral vaginitis.  Atrophic vaginitis.  Allergic vaginitis.  What are the causes? The cause of this condition depends on the type of vaginitis. It can be caused by:  Bacteria (bacterial vaginosis).  Yeast, which is a fungus (yeast infection).  A parasite (trichomoniasis vaginitis).  A virus (viral vaginitis).  Low hormone levels (atrophic vaginitis). Low hormone levels can occur during pregnancy, breastfeeding, or after menopause.  Irritants, such as bubble baths, scented tampons, and feminine sprays (allergic vaginitis).  Other factors can change the normal  balance of the yeast and bacteria that live in the vagina. These include:  Antibiotic medicines.  Poor hygiene.  Diaphragms, vaginal sponges, spermicides, birth control pills, and intrauterine devices (IUD).  Sex.  Infection.  Uncontrolled diabetes.  A weakened defense (immune) system.  What increases the risk? This condition is more likely to develop in women who:  Smoke.  Use vaginal douches, scented tampons, or scented sanitary  pads.  Wear tight-fitting pants.  Wear thong underwear.  Use oral birth control pills or an IUD.  Have sex without a condom.  Have multiple sex partners.  Have an STD.  Frequently use the spermicide nonoxynol-9.  Eat lots of foods high in sugar.  Have uncontrolled diabetes.  Have low estrogen levels.  Have a weakened immune system from an immune disorder or medical treatment.  Are pregnant or breastfeeding.  What are the signs or symptoms? Symptoms vary depending on the cause of the vaginitis. Common symptoms include:  Abnormal vaginal discharge. ? The discharge is white, gray, or yellow with bacterial vaginosis. ? The discharge is thick, white, and cheesy with a yeast infection. ? The discharge is frothy and yellow or greenish with trichomoniasis.  A bad vaginal smell. The smell is fishy with bacterial vaginosis.  Vaginal itching, pain, or swelling.  Sex that is painful.  Pain or burning when urinating.  Sometimes there are no symptoms. How is this diagnosed? This condition is diagnosed based on your symptoms and medical history. A physical exam, including a pelvic exam, will also be done. You may also have other tests, including:  Tests to determine the pH level (acidity or alkalinity) of your vagina.  A whiff test, to assess the odor that results when a sample of your vaginal discharge is mixed with a potassium hydroxide solution.  Tests of vaginal fluid. A sample will be examined under a microscope.  How is this treated? Treatment varies depending on the type of vaginitis you have. Your treatment may include:  Antibiotic creams or pills to treat bacterial vaginosis and trichomoniasis.  Antifungal medicines, such as vaginal creams or suppositories, to treat a yeast infection.  Medicine to ease discomfort if you have viral vaginitis. Your sexual partner should also be treated.  Estrogen delivered in a cream, pill, suppository, or vaginal ring to treat  atrophic vaginitis. If vaginal dryness occurs, lubricants and moisturizing creams may help. You may need to avoid scented soaps, sprays, or douches.  Stopping use of a product that is causing allergic vaginitis. Then using a vaginal cream to treat the symptoms.  Follow these instructions at home: Lifestyle  Keep your genital area clean and dry. Avoid soap, and only rinse the area with water.  Do not douche or use tampons until your health care provider says it is okay to do so. Use sanitary pads, if needed.  Do not have sex until your health care provider approves. When you can return to sex, practice safe sex and use condoms.  Wipe from front to back. This avoids the spread of bacteria from the rectum to the vagina. General instructions  Take over-the-counter and prescription medicines only as told by your health care provider.  If you were prescribed an antibiotic medicine, take or use it as told by your health care provider. Do not stop taking or using the antibiotic even if you start to feel better.  Keep all follow-up visits as told by your health care provider. This is important. How is this prevented?  Use mild, non-scented products.  Do not use things that can irritate the vagina, such as fabric softeners. Avoid the following products if they are scented: ? Feminine sprays. ? Detergents. ? Tampons. ? Feminine hygiene products. ? Soaps or bubble baths.  Let air reach your genital area. ? Wear cotton underwear to reduce moisture buildup. ? Avoid wearing underwear while you sleep. ? Avoid wearing tight pants and underwear or nylons without a cotton panel. ? Avoid wearing thong underwear.  Take off any wet clothing, such as bathing suits, as soon as possible.  Practice safe sex and use condoms. Contact a health care provider if:  You have abdominal pain.  You have a fever.  You have symptoms that last for more than 2-3 days. Get help right away if:  You have a fever  and your symptoms suddenly get worse. Summary  Vaginitis is a condition in which the vaginal tissue becomes inflamed.This condition is most often caused by a change in the normal balance of bacteria and yeast that live in the vagina.  Treatment varies depending on the type of vaginitis you have.  Do not douche, use tampons , or have sex until your health care provider approves. When you can return to sex, practice safe sex and use condoms. This information is not intended to replace advice given to you by your health care provider. Make sure you discuss any questions you have with your health care provider. Document Released: 05/12/2007 Document Revised: 08/20/2016 Document Reviewed: 08/20/2016 Elsevier Interactive Patient Education  [employer].

## 2017-08-08 DIAGNOSIS — F410 Panic disorder [episodic paroxysmal anxiety] without agoraphobia: Secondary | ICD-10-CM | POA: Diagnosis not present

## 2017-08-08 DIAGNOSIS — I10 Essential (primary) hypertension: Secondary | ICD-10-CM | POA: Diagnosis not present

## 2017-08-08 DIAGNOSIS — F419 Anxiety disorder, unspecified: Secondary | ICD-10-CM | POA: Diagnosis not present

## 2017-08-08 DIAGNOSIS — K219 Gastro-esophageal reflux disease without esophagitis: Secondary | ICD-10-CM | POA: Diagnosis not present

## 2017-08-08 DIAGNOSIS — R7301 Impaired fasting glucose: Secondary | ICD-10-CM | POA: Diagnosis not present

## 2017-08-08 DIAGNOSIS — E663 Overweight: Secondary | ICD-10-CM | POA: Diagnosis not present

## 2017-08-18 ENCOUNTER — Telehealth: Payer: 59 | Admitting: Family

## 2017-08-18 DIAGNOSIS — B9689 Other specified bacterial agents as the cause of diseases classified elsewhere: Secondary | ICD-10-CM

## 2017-08-18 DIAGNOSIS — J028 Acute pharyngitis due to other specified organisms: Secondary | ICD-10-CM

## 2017-08-18 MED ORDER — BENZONATATE 100 MG PO CAPS: 100.0000 mg | ORAL_CAPSULE | Freq: Three times a day (TID) | ORAL | 0 refills | Status: DC | PRN

## 2017-08-18 MED ORDER — PREDNISONE 5 MG PO TABS: 5.0000 mg | ORAL_TABLET | ORAL | 0 refills | Status: DC

## 2017-08-18 MED ORDER — DOXYCYCLINE HYCLATE 100 MG PO TABS: 100.0000 mg | ORAL_TABLET | Freq: Two times a day (BID) | ORAL | 0 refills | Status: DC

## 2017-08-18 MED FILL — predniSONE 5 MG TABS: 5 | 6 days supply | Qty: 21 | Fill #0

## 2017-08-18 MED FILL — DOXYCYCLINE HYCLATE 100 MG: 100 | 7 days supply | Qty: 14 | Fill #0

## 2017-08-18 MED FILL — BENZONATATE 100 MG CAPS: 100 | 5 days supply | Qty: 30 | Fill #0

## 2017-08-18 NOTE — Progress Notes (Signed)
Thank you for the details you included in the comment boxes. Those details are very helpful in determining the best course of treatment for you and help [place] to provide the best care. See the treatment below for cough AND sinus, and continue to use the flonase also.   We are sorry that you are not feeling well.  Here is how we plan to help!  Based on your presentation I believe you most likely have A cough due to bacteria.  When patients have a fever and a productive cough with a change in color or increased sputum production, we are concerned about bacterial bronchitis.  If left untreated it can progress to pneumonia.  If your symptoms do not improve with your treatment plan it is important that you contact your provider.   I have prescribed Doxycycline 100 mg twice a day for 7 days     In addition you may use A non-prescription cough medication called Mucinex DM: take 2 tablets every 12 hours. and A prescription cough medication called Tessalon Perles 100mg . You may take 1-2 capsules every 8 hours as needed for your cough.  Sterapred 5 mg dosepak  From your responses in the eVisit questionnaire you describe inflammation in the upper respiratory tract which is causing a significant cough.  This is commonly called Bronchitis and has four common causes:    Allergies  Viral Infections  Acid Reflux  Bacterial Infection Allergies, viruses and acid reflux are treated by controlling symptoms or eliminating the cause. An example might be a cough caused by taking certain blood pressure medications. You stop the cough by changing the medication. Another example might be a cough caused by acid reflux. Controlling the reflux helps control the cough.  USE OF BRONCHODILATOR ("RESCUE") INHALERS: There is a risk from using your bronchodilator too frequently.  The risk is that over-reliance on a medication which only relaxes the muscles surrounding the breathing tubes can reduce the effectiveness of medications  prescribed to reduce swelling and congestion of the tubes themselves.  Although you feel brief relief from the bronchodilator inhaler, your asthma may actually be worsening with the tubes becoming more swollen and filled with mucus.  This can delay other crucial treatments, such as oral steroid medications. If you need to use a bronchodilator inhaler daily, several times per day, you should discuss this with your provider.  There are probably better treatments that could be used to keep your asthma under control.     HOME CARE . Only take medications as instructed by your medical team. . Complete the entire course of an antibiotic. . Drink plenty of fluids and get plenty of rest. . Avoid close contacts especially the very young and the elderly . Cover your mouth if you cough or cough into your sleeve. . Always remember to wash your hands . A steam or ultrasonic humidifier can help congestion.   GET HELP RIGHT AWAY IF: . You develop worsening fever. . You become short of breath . You cough up blood. . Your symptoms persist after you have completed your treatment plan MAKE SURE YOU   Understand these instructions.  Will watch your condition.  Will get help right away if you are not doing well or get worse.  Your e-visit answers were reviewed by a board certified advanced clinical practitioner to complete your personal care plan.  Depending on the condition, your plan could have included both over the counter or prescription medications. If there is a problem please reply  once you have received a response from your provider. Your safety is important to [place].  If you have drug allergies check your prescription carefully.    You can use MyChart to ask questions about today's visit, request a non-urgent call back, or ask for a work or school excuse for 24 hours related to this e-Visit. If it has been greater than 24 hours you will need to follow up with your provider, or enter a new e-Visit to  address those concerns. You will get an e-mail in the next two days asking about your experience.  I hope that your e-visit has been valuable and will speed your recovery. Thank you for using e-visits.

## 2017-09-12 MED FILL — PANTOPRAZOLE SOD DR 40 MG T: 40 | 90 days supply | Qty: 90 | Fill #1

## 2017-09-12 MED FILL — HYDROCHLOROTHIAZIDE 25 MG T: 25 | 90 days supply | Qty: 90 | Fill #1

## 2017-09-16 DIAGNOSIS — H5201 Hypermetropia, right eye: Secondary | ICD-10-CM | POA: Diagnosis not present

## 2017-09-18 ENCOUNTER — Encounter (INDEPENDENT_AMBULATORY_CARE_PROVIDER_SITE_OTHER): Payer: Self-pay

## 2017-09-22 DIAGNOSIS — Z0289 Encounter for other administrative examinations: Secondary | ICD-10-CM

## 2017-09-23 ENCOUNTER — Ambulatory Visit (INDEPENDENT_AMBULATORY_CARE_PROVIDER_SITE_OTHER): Payer: Self-pay | Admitting: Family Medicine

## 2017-10-07 ENCOUNTER — Encounter (INDEPENDENT_AMBULATORY_CARE_PROVIDER_SITE_OTHER): Payer: Self-pay | Admitting: Family Medicine

## 2017-10-07 ENCOUNTER — Ambulatory Visit (INDEPENDENT_AMBULATORY_CARE_PROVIDER_SITE_OTHER): Payer: 59 | Admitting: Family Medicine

## 2017-10-07 VITALS — BP 123/86 | HR 68 | Temp 98.1°F | Ht 66.0 in | Wt 255.0 lb

## 2017-10-07 DIAGNOSIS — Z1331 Encounter for screening for depression: Secondary | ICD-10-CM

## 2017-10-07 DIAGNOSIS — R5383 Other fatigue: Secondary | ICD-10-CM | POA: Diagnosis not present

## 2017-10-07 DIAGNOSIS — Z9189 Other specified personal risk factors, not elsewhere classified: Secondary | ICD-10-CM | POA: Diagnosis not present

## 2017-10-07 DIAGNOSIS — R0602 Shortness of breath: Secondary | ICD-10-CM | POA: Diagnosis not present

## 2017-10-07 DIAGNOSIS — Z6841 Body Mass Index (BMI) 40.0 and over, adult: Secondary | ICD-10-CM

## 2017-10-07 DIAGNOSIS — I10 Essential (primary) hypertension: Secondary | ICD-10-CM

## 2017-10-07 NOTE — Progress Notes (Signed)
.  Office: [phone]  /  Fax: [phone]   HPI:   Chief Complaint: OBESITY  Grace Pennington (MR# [id number]) is a 47 y.o. female who presents on 10/07/2017 for obesity evaluation and treatment. Current BMI is Body mass index is 41.16 kg/m.[name] has struggled with obesity for years and has been unsuccessful in either losing weight or maintaining long term weight loss. She was told about our clinic by a coworker. Grace Pennington attended our information session and states she is currently in the action stage of change and ready to dedicate time achieving and maintaining a healthier weight.  Grace Pennington states she thinks her family will eat healthier with  her her desired weight loss is 86 lbs her heaviest weight ever was 260 lbs. she has significant food cravings issues  she snacks frequently in the evenings she is frequently drinking liquids with calories she frequently eats larger portions than normal  she has binge eating behaviors she struggles with emotional eating    Fatigue Jocelynn feels her energy is lower than it should be. This has worsened with weight gain and has not worsened recently. Jericha admits to daytime somnolence and  admits to waking up still tired. Patient has a diagnosis of obstructive sleep apnea and uses CPAP. Patent has a history of symptoms of daytime fatigue, morning fatigue, morning headache and hypertension. Patient generally gets 6 hours of sleep per night, and states they generally have restless sleep. Snoring is present. Apneic episodes are present. Epworth Sleepiness Score is 4  EKG was ordered today and was unchanged from previous EKG.  Dyspnea on exertion Grace Pennington notes increasing shortness of breath with exercising and seems to be worsening over time with weight gain. She notes getting out of breath sooner with activity than she used to. This has not gotten worse recently. EKG was ordered today and was unchanged from previous EKG. Grace Pennington denies  orthopnea.  Hypertension Grace Pennington is a 47 y.o. female with hypertension. She held off taking her medications this morning. Grace Pennington denies chest pain or shortness of breath on exertion. She is working weight loss to help control her blood pressure with the goal of decreasing her risk of heart attack and stroke. Tonyas blood pressure is well controlled this morning.  At risk for cardiovascular disease Grace Pennington is at a higher than average risk for cardiovascular disease due to obesity and hypertension. She currently denies any chest pain.  Depression Screen Grace Pennington's Food and Mood (modified PHQ-9) score was  Depression screen PHQ 2/9 10/07/2017  Decreased Interest 1  Down, Depressed, Hopeless 0  PHQ - 2 Score 1  Altered sleeping 2  Tired, decreased energy 1  Change in appetite 1  Feeling bad or failure about yourself  1  Trouble concentrating 1  Moving slowly or fidgety/restless 1  Suicidal thoughts 0  PHQ-9 Score 8  Difficult doing work/chores Not difficult at all    ALLERGIES: No Known Allergies  MEDICATIONS: Current Outpatient Medications on File Prior to Visit  Medication Sig Dispense Refill  . citalopram (CELEXA) 10 MG tablet Take 10 mg by mouth daily.  6  . hydrochlorothiazide (HYDRODIURIL) 25 MG tablet Take 25 mg by mouth daily.    . pantoprazole (PROTONIX) 40 MG tablet Take 40 mg by mouth daily.    . ranitidine (ZANTAC) 150 MG capsule Take 150 mg by mouth 2 (two) times daily.     No current facility-administered medications on file prior to visit.     PAST MEDICAL  HISTORY: Past Medical History:  Diagnosis Date  . Hypertension   . Palpitations   . Sleep apnea     PAST SURGICAL HISTORY: Past Surgical History:  Procedure Laterality Date  . TUBAL LIGATION      SOCIAL HISTORY: Social History   Tobacco Use  . Smoking status: Former Smoker    Last attempt to quit: 07/29/1996    Years since quitting: 21.2  . Smokeless tobacco: Never Used  Substance Use  Topics  . Alcohol use: No  . Drug use: No    FAMILY HISTORY: Family History  Problem Relation Age of Onset  . Heart disease Mother   . Diabetes Mother   . Hyperlipidemia Mother   . Hypertension Mother   . Kidney failure Mother   . Diabetes Unknown   . Stroke Unknown   . Hypertension Unknown   . Kidney failure Unknown   . Coronary artery disease Unknown   . Sleep apnea Father   . Diabetes Father   . Hyperlipidemia Father   . Hypertension Father     ROS: Review of Systems  Constitutional: Positive for malaise/fatigue.  Respiratory: Positive for shortness of breath (on exertion).   Cardiovascular: Negative for chest pain and orthopnea.    PHYSICAL EXAM: Blood pressure 123/86, pulse 68, temperature 98.1 F (36.7 C), temperature source Oral, height 5\' 6"  (1.676 m), weight 255 lb (115.7 kg), SpO2 98 %. Body mass index is 41.16 kg/m. Physical Exam  Constitutional: She is oriented to person, place, and time. She appears well-developed and well-nourished.  HENT:  Head: Normocephalic and atraumatic.  Nose: Nose normal.  Eyes: EOM are normal. No scleral icterus.  Neck: Normal range of motion. Neck supple. No thyromegaly present.  Cardiovascular: Normal rate and regular rhythm.  Pulmonary/Chest: Effort normal. No respiratory distress.  Abdominal: Soft. There is no tenderness.  +obesity  Musculoskeletal: Normal range of motion. She exhibits no tenderness.  Range of Motion normal in all 4 extremities  Neurological: She is alert and oriented to person, place, and time. Coordination normal.  Skin: Skin is warm and dry.  Psychiatric: She has a normal mood and affect. Her behavior is normal.  Vitals reviewed.   RECENT LABS AND TESTS: BMET    Component Value Date/Time   NA 137 01/03/2017 1624   K 3.2 (L) 01/03/2017 1624   CL 103 01/03/2017 1624   CO2 26 01/03/2017 1624   GLUCOSE 147 (H) 01/03/2017 1624   BUN 15 01/03/2017 1624   CREATININE 0.96 01/03/2017 1624   CALCIUM  9.0 01/03/2017 1624   GFRNONAA >60 01/03/2017 1624   GFRAA >60 01/03/2017 1624   No results found for: HGBA1C No results found for: INSULIN CBC    Component Value Date/Time   WBC 7.4 01/03/2017 1624   RBC 4.54 01/03/2017 1624   HGB 11.4 (L) 01/03/2017 1624   HCT 35.2 (L) 01/03/2017 1624   PLT 319 01/03/2017 1624   MCV 77.5 (L) 01/03/2017 1624   MCH 25.1 (L) 01/03/2017 1624   MCHC 32.4 01/03/2017 1624   RDW 16.4 (H) 01/03/2017 1624   LYMPHSABS 3.0 07/22/2013 1515   MONOABS 0.9 07/22/2013 1515   EOSABS 0.4 07/22/2013 1515   BASOSABS 0.0 07/22/2013 1515   Iron/TIBC/Ferritin/ %Sat No results found for: IRON, TIBC, FERRITIN, IRONPCTSAT Lipid Panel  No results found for: CHOL, TRIG, HDL, CHOLHDL, VLDL, LDLCALC, LDLDIRECT Hepatic Function Panel     Component Value Date/Time   PROT 8.3 02/20/2014 0318   ALBUMIN 3.8 02/20/2014 0318  AST 20 02/20/2014 0318   ALT 19 02/20/2014 0318   ALKPHOS 66 02/20/2014 0318   BILITOT 0.3 02/20/2014 0318   No results found for: TSH Vitamin D There are no recent lab results  ECG  shows NSR with a rate of 72 BPM INDIRECT CALORIMETER done today shows a VO2 of 269 and a REE of 1872. Her calculated basal metabolic rate is 4496 thus her basal metabolic rate is worse than expected.    ASSESSMENT AND PLAN: Other fatigue - Plan: EKG 12-Lead, VITAMIN D 25 Hydroxy (Vit-D Deficiency, Fractures), Vitamin B12, Folate, T3, T4, free, TSH  Shortness of breath on exertion  Essential hypertension - Plan: Comprehensive metabolic panel, CBC With Differential, Hemoglobin A1c, Insulin, random, Lipid Panel With LDL/HDL Ratio  Depression screening  At risk for heart disease  Class 3 severe obesity with serious comorbidity and body mass index (BMI) of 40.0 to 44.9 in adult, unspecified obesity type (HCC)  PLAN:  Fatigue Daleyssa was informed that her fatigue may be related to obesity, depression or many other causes. Labs will be ordered, and in the  meanwhile Shery has agreed to work on diet, exercise and weight loss to help with fatigue. Proper sleep hygiene was discussed including the need for 7-8 hours of quality sleep each night. A sleep study was not ordered based on symptoms and Epworth score. We will order indirect calorimetry.  Dyspnea on exertion Grace Pennington's shortness of breath appears to be obesity related and exercise induced. She has agreed to work on weight loss and gradually increase exercise to treat her exercise induced shortness of breath. If Grace Pennington follows our instructions and loses weight without improvement of her shortness of breath, we will plan to refer to pulmonology. We will order indirect calorimetry, and labs. We will monitor this condition regularly. Areal agrees to this plan.  Hypertension We discussed sodium restriction, working on healthy weight loss, and a regular exercise program as the means to achieve improved blood pressure control. Grace Pennington agreed with this plan and agreed to follow up with our clinic in 2 weeks. We will continue to monitor her blood pressure as well as her progress with the above lifestyle modifications. She will continue her medications as prescribed and will watch for signs of hypotension as she continues her lifestyle modifications.  Cardiovascular risk counseling Grace Pennington was given extended (15 minutes) coronary artery disease prevention counseling today. She is 47 y.o. female and has risk factors for heart disease including obesity and hypertension. We discussed intensive lifestyle modifications today with an emphasis on specific weight loss instructions and strategies. Pt was also informed of the importance of increasing exercise and decreasing saturated fats to help prevent heart disease.  Depression Screen Grace Pennington had a mildly positive depression screening. Depression is commonly associated with obesity and often results in emotional eating behaviors. We will monitor this closely and work on CBT to  help improve the non-hunger eating patterns. Referral to Psychology may be required if no improvement is seen as she continues in our clinic.  Obesity Grace Pennington is currently in the action stage of change and her goal is to continue with weight loss efforts She has agreed to follow the Category 3 plan +100 calories if needed Grace Pennington has been instructed to work up to a goal of 150 minutes of combined cardio and strengthening exercise per week for weight loss and overall health benefits. We discussed the following Behavioral Modification Strategies today: planning for success, keeping healthy foods in the home, increasing lean protein  intake, decrease eating out and work on meal planning and easy cooking plans  Grace Pennington has agreed to follow up with our clinic in 2 weeks. She was informed of the importance of frequent follow up visits to maximize her success with intensive lifestyle modifications for her multiple health conditions. She was informed we would discuss her lab results at her next visit unless there is a critical issue that needs to be addressed sooner. Grace Pennington agreed to keep her next visit at the agreed upon time to discuss these results.    OBESITY BEHAVIORAL INTERVENTION VISIT  Today's visit was # 1 out of 22.  Starting weight: 255 lbs Starting date: 10/07/17 Today's weight : 255 lbs Today's date: 10/07/2017 Total lbs lost to date: 0 (Patients must lose 7 lbs in the first 6 months to continue with counseling)   ASK: We discussed the diagnosis of obesity with Grace Pennington today and Kay agreed to give [place] permission to discuss obesity behavioral modification therapy today.  ASSESS: Grace Pennington has the diagnosis of obesity and her BMI today is 41.18 Grace Pennington is in the action stage of change   ADVISE: Grace Pennington was educated on the multiple health risks of obesity as well as the benefit of weight loss to improve her health. She was advised of the need for long term treatment and the importance of  lifestyle modifications.  AGREE: Multiple dietary modification options and treatment options were discussed and  Grace Pennington agreed to the above obesity treatment plan.   I, Doreene Nest, am acting as transcriptionist for Eber Jones, MD   I have reviewed the above documentation for accuracy and completeness, and I agree with the above. - Ilene Qua, MD

## 2017-10-08 LAB — COMPREHENSIVE METABOLIC PANEL
ALT: 22 IU/L (ref 0–32)
AST: 19 IU/L (ref 0–40)
Albumin/Globulin Ratio: 1.2 (ref 1.2–2.2)
Albumin: 4 g/dL (ref 3.5–5.5)
Alkaline Phosphatase: 73 IU/L (ref 39–117)
BUN/Creatinine Ratio: 17 (ref 9–23)
BUN: 16 mg/dL (ref 6–24)
Bilirubin Total: 0.2 mg/dL (ref 0.0–1.2)
CO2: 23 mmol/L (ref 20–29)
Calcium: 9.3 mg/dL (ref 8.7–10.2)
Chloride: 103 mmol/L (ref 96–106)
Creatinine, Ser: 0.92 mg/dL (ref 0.57–1.00)
GFR calc Af Amer: 87 mL/min/{1.73_m2} (ref >59)
GFR calc non Af Amer: 75 mL/min/{1.73_m2} (ref >59)
Globulin, Total: 3.4 g/dL (ref 1.5–4.5)
Glucose: 87 mg/dL (ref 65–99)
Potassium: 3.9 mmol/L (ref 3.5–5.2)
Sodium: 139 mmol/L (ref 134–144)
Total Protein: 7.4 g/dL (ref 6.0–8.5)

## 2017-10-08 LAB — CBC WITH DIFFERENTIAL
Basophils Absolute: 0 10*3/uL (ref 0.0–0.2)
Basos: 1 %
EOS (ABSOLUTE): 0.2 10*3/uL (ref 0.0–0.4)
Eos: 4 %
Hematocrit: 39.4 % (ref 34.0–46.6)
Hemoglobin: 12.6 g/dL (ref 11.1–15.9)
Immature Grans (Abs): 0 10*3/uL (ref 0.0–0.1)
Immature Granulocytes: 0 %
Lymphocytes Absolute: 2.8 10*3/uL (ref 0.7–3.1)
Lymphs: 47 %
MCH: 26.6 pg (ref 26.6–33.0)
MCHC: 32 g/dL (ref 31.5–35.7)
MCV: 83 fL (ref 79–97)
Monocytes Absolute: 0.4 10*3/uL (ref 0.1–0.9)
Monocytes: 6 %
Neutrophils Absolute: 2.5 10*3/uL (ref 1.4–7.0)
Neutrophils: 42 %
RBC: 4.73 x10E6/uL (ref 3.77–5.28)
RDW: 16.6 % — ABNORMAL HIGH (ref 12.3–15.4)
WBC: 5.9 10*3/uL (ref 3.4–10.8)

## 2017-10-08 LAB — HEMOGLOBIN A1C
Est. average glucose Bld gHb Est-mCnc: 137 mg/dL
Hgb A1c MFr Bld: 6.4 % — ABNORMAL HIGH (ref 4.8–5.6)

## 2017-10-08 LAB — VITAMIN D 25 HYDROXY (VIT D DEFICIENCY, FRACTURES): Vit D, 25-Hydroxy: 16.7 ng/mL — ABNORMAL LOW (ref 30.0–100.0)

## 2017-10-08 LAB — LIPID PANEL WITH LDL/HDL RATIO
Cholesterol, Total: 184 mg/dL (ref 100–199)
HDL: 52 mg/dL (ref >39)
LDL Calculated: 120 mg/dL — ABNORMAL HIGH (ref 0–99)
LDl/HDL Ratio: 2.3 ratio (ref 0.0–3.2)
Triglycerides: 58 mg/dL (ref 0–149)
VLDL Cholesterol Cal: 12 mg/dL (ref 5–40)

## 2017-10-08 LAB — INSULIN, RANDOM: INSULIN: 14.3 u[IU]/mL (ref 2.6–24.9)

## 2017-10-08 LAB — TSH: TSH: 1.73 u[IU]/mL (ref 0.450–4.500)

## 2017-10-08 LAB — VITAMIN B12: Vitamin B-12: 369 pg/mL (ref 232–1245)

## 2017-10-08 LAB — FOLATE: Folate: 17 ng/mL (ref >3.0)

## 2017-10-08 LAB — T4, FREE: Free T4: 1.21 ng/dL (ref 0.82–1.77)

## 2017-10-08 LAB — T3: T3, Total: 102 ng/dL (ref 71–180)

## 2017-10-21 ENCOUNTER — Ambulatory Visit (INDEPENDENT_AMBULATORY_CARE_PROVIDER_SITE_OTHER): Payer: 59 | Admitting: Family Medicine

## 2017-10-21 VITALS — BP 101/68 | HR 74 | Temp 97.9°F | Ht 66.0 in | Wt 250.0 lb

## 2017-10-21 DIAGNOSIS — I10 Essential (primary) hypertension: Secondary | ICD-10-CM

## 2017-10-21 DIAGNOSIS — E559 Vitamin D deficiency, unspecified: Secondary | ICD-10-CM

## 2017-10-21 DIAGNOSIS — Z9189 Other specified personal risk factors, not elsewhere classified: Secondary | ICD-10-CM | POA: Diagnosis not present

## 2017-10-21 DIAGNOSIS — Z6841 Body Mass Index (BMI) 40.0 and over, adult: Secondary | ICD-10-CM

## 2017-10-21 DIAGNOSIS — R7303 Prediabetes: Secondary | ICD-10-CM

## 2017-10-21 MED ORDER — VITAMIN D (ERGOCALCIFEROL) 1.25 MG (50000 UNIT) PO CAPS: 50000.0000 [IU] | ORAL_CAPSULE | ORAL | 0 refills | Status: DC

## 2017-10-21 MED ORDER — METFORMIN HCL 500 MG PO TABS: 500.0000 mg | ORAL_TABLET | Freq: Every day | ORAL | 0 refills | Status: DC

## 2017-10-21 MED FILL — metFORMIN HCL 500 MG TABS: 500 | 30 days supply | Qty: 30 | Fill #0

## 2017-10-21 MED FILL — VIT D2 1.25 MG (50,000 UNIT: 1.25 MG | 28 days supply | Qty: 4 | Fill #0

## 2017-10-21 NOTE — Progress Notes (Signed)
Office: [phone]  /  Fax: [phone]   HPI:   Chief Complaint: OBESITY Grace Pennington is here to discuss her progress with her obesity treatment plan. She is on the Category 3 plan and is following her eating plan approximately 100 % of the time. She states she is walking 30 minutes 5 times per week. Grace Pennington enjoyed the plan and she is liking the structure of the plan. She had good snacking options and she liked all the foods. Her weight is 250 lb (113.4 kg) today and has had a weight loss of 5 pounds over a period of 2 weeks since her last visit. She has lost 5 lbs since starting treatment with [place].  Vitamin D deficiency Grace Pennington has a diagnosis of vitamin D deficiency. Grace Pennington has a vitamin D level of 16.7 and she is not currently taking OTC vit D. Grace Pennington denies nausea, vomiting or muscle weakness.  Pre-Diabetes Grace Pennington has a diagnosis of pre-diabetes based on her elevated Hgb A1c of 6.4 and fasting insulin of 4.9. Grace Pennington was informed this puts her at greater risk of developing diabetes. She is not taking metformin currently and continues to work on diet and exercise to decrease risk of diabetes. She denies nausea or hypoglycemia.  At risk for diabetes Grace Pennington is at higher than average risk for developing diabetes due to her obesity and pre-diabetes. She currently denies polyuria or polydipsia.  Hypertension Grace Pennington is a 47 y.o. female with hypertension. Her blood pressure is lower today. Grace Pennington denies dizziness, lightheadedness or syncope. She is working weight loss to help control her blood pressure with the goal of decreasing her risk of heart attack and stroke. Tonyas blood pressure is currently controlled.  ALLERGIES: No Known Allergies  MEDICATIONS: Current Outpatient Medications on File Prior to Visit  Medication Sig Dispense Refill  . citalopram (CELEXA) 10 MG tablet Take 10 mg by mouth daily.  6  . hydrochlorothiazide (HYDRODIURIL) 25 MG tablet Take 25 mg by mouth daily.      . pantoprazole (PROTONIX) 40 MG tablet Take 40 mg by mouth daily.    . ranitidine (ZANTAC) 150 MG capsule Take 150 mg by mouth 2 (two) times daily.     No current facility-administered medications on file prior to visit.     PAST MEDICAL HISTORY: Past Medical History:  Diagnosis Date  . Hypertension   . Palpitations   . Sleep apnea     PAST SURGICAL HISTORY: Past Surgical History:  Procedure Laterality Date  . TUBAL LIGATION      SOCIAL HISTORY: Social History   Tobacco Use  . Smoking status: Former Smoker    Last attempt to quit: 07/29/1996    Years since quitting: 21.2  . Smokeless tobacco: Never Used  Substance Use Topics  . Alcohol use: No  . Drug use: No    FAMILY HISTORY: Family History  Problem Relation Age of Onset  . Heart disease Mother   . Diabetes Mother   . Hyperlipidemia Mother   . Hypertension Mother   . Kidney failure Mother   . Diabetes Unknown   . Stroke Unknown   . Hypertension Unknown   . Kidney failure Unknown   . Coronary artery disease Unknown   . Sleep apnea Father   . Diabetes Father   . Hyperlipidemia Father   . Hypertension Father     ROS: Review of Systems  Constitutional: Positive for weight loss.  Gastrointestinal: Negative for nausea and vomiting.  Genitourinary: Negative for  frequency.  Musculoskeletal:       Negative for muscle weakness  Endo/Heme/Allergies: Negative for polydipsia.       Negative for hypoglycemia    PHYSICAL EXAM: Blood pressure 101/68, pulse 74, temperature 97.9 F (36.6 C), temperature source Oral, height 5\' 6"  (1.676 m), weight 250 lb (113.4 kg), SpO2 97 %. Body mass index is 40.35 kg/m. Physical Exam  Constitutional: She is oriented to person, place, and time. She appears well-developed and well-nourished.  Cardiovascular: Normal rate.  Pulmonary/Chest: Effort normal.  Neurological: She is oriented to person, place, and time.  Skin: Skin is warm and dry.  Psychiatric: She has a normal mood  and affect. Her behavior is normal.  Vitals reviewed.   RECENT LABS AND TESTS: BMET    Component Value Date/Time   NA 139 10/07/2017 1200   K 3.9 10/07/2017 1200   CL 103 10/07/2017 1200   CO2 23 10/07/2017 1200   GLUCOSE 87 10/07/2017 1200   GLUCOSE 147 (H) 01/03/2017 1624   BUN 16 10/07/2017 1200   CREATININE 0.92 10/07/2017 1200   CALCIUM 9.3 10/07/2017 1200   GFRNONAA 75 10/07/2017 1200   GFRAA 87 10/07/2017 1200   Lab Results  Component Value Date   HGBA1C 6.4 (H) 10/07/2017   Lab Results  Component Value Date   INSULIN 14.3 10/07/2017   CBC    Component Value Date/Time   WBC 5.9 10/07/2017 1200   WBC 7.4 01/03/2017 1624   RBC 4.73 10/07/2017 1200   RBC 4.54 01/03/2017 1624   HGB 12.6 10/07/2017 1200   HCT 39.4 10/07/2017 1200   PLT 319 01/03/2017 1624   MCV 83 10/07/2017 1200   MCH 26.6 10/07/2017 1200   MCH 25.1 (L) 01/03/2017 1624   MCHC 32.0 10/07/2017 1200   MCHC 32.4 01/03/2017 1624   RDW 16.6 (H) 10/07/2017 1200   LYMPHSABS 2.8 10/07/2017 1200   MONOABS 0.9 07/22/2013 1515   EOSABS 0.2 10/07/2017 1200   BASOSABS 0.0 10/07/2017 1200   Iron/TIBC/Ferritin/ %Sat No results found for: IRON, TIBC, FERRITIN, IRONPCTSAT Lipid Panel     Component Value Date/Time   CHOL 184 10/07/2017 1200   TRIG 58 10/07/2017 1200   HDL 52 10/07/2017 1200   LDLCALC 120 (H) 10/07/2017 1200   Hepatic Function Panel     Component Value Date/Time   PROT 7.4 10/07/2017 1200   ALBUMIN 4.0 10/07/2017 1200   AST 19 10/07/2017 1200   ALT 22 10/07/2017 1200   ALKPHOS 73 10/07/2017 1200   BILITOT 0.2 10/07/2017 1200      Component Value Date/Time   TSH 1.730 10/07/2017 1200   Results for KARIEL, SKILLMAN "FLOREINE KINGDON" (MRN [id number]) as of 10/21/2017 15:17  Ref. Range 10/07/2017 12:00  Vitamin D, 25-Hydroxy Latest Ref Range: 30.0 - 100.0 ng/mL 16.7 (L)   ASSESSMENT AND PLAN: Vitamin D deficiency - Plan: Vitamin D, Ergocalciferol, (DRISDOL) 50000 units CAPS  capsule  Prediabetes - Plan: metFORMIN (GLUCOPHAGE) 500 MG tablet  Essential hypertension  At risk for diabetes mellitus  Class 3 severe obesity with serious comorbidity and body mass index (BMI) of 40.0 to 44.9 in adult, unspecified obesity type ([facility])  PLAN:  Vitamin D Deficiency Grace Pennington was informed that low vitamin D levels contributes to fatigue and are associated with obesity, breast, and colon cancer. She agrees to continue to take prescription Vit D @50 ,000 IU every week and will follow up for routine testing of vitamin D, at least 2-3 times per year. She  was informed of the risk of over-replacement of vitamin D and agrees to not increase her dose unless she discusses this with [place] first.  Pre-Diabetes Grace Pennington will continue to work on weight loss, exercise, and decreasing simple carbohydrates in her diet to help decrease the risk of diabetes. We dicussed metformin including benefits and risks. She was informed that eating too many simple carbohydrates or too many calories at one sitting increases the likelihood of GI side effects. Tate agreed to start metformin 500 mg by mouth q AM #30 with no refills and follow up with [place] as directed to monitor her progress.  Diabetes risk counseling Grace Pennington was given extended (30 minutes) diabetes prevention counseling today. She is 47 y.o. female and has risk factors for diabetes including obesity and pre-diabetes. We discussed intensive lifestyle modifications today with an emphasis on weight loss as well as increasing exercise and decreasing simple carbohydrates in her diet.  Hypertension We discussed sodium restriction, working on healthy weight loss, and a regular exercise program as the means to achieve improved blood pressure control. Grace Pennington agreed with this plan and agreed to follow up as directed. We will continue to monitor her blood pressure as well as her progress with the above lifestyle modifications. She will continue her medications as  prescribed and will watch for signs of hypotension as she continues her lifestyle modifications. We will follow up at the next visit and if her blood pressure is low again, we will decrease HCTZ. Patient to let us know via MyChart if she has any of the above symptoms.  Obesity Grace Pennington is currently in the action stage of change. As such, her goal is to continue with weight loss efforts She has agreed to follow the Category 3 plan Grace Pennington has been instructed to work up to a goal of 150 minutes of combined cardio and strengthening exercise per week for weight loss and overall health benefits. We discussed the following Behavioral Modification Strategies today: increasing lean protein intake, increasing vegetables and work on meal planning and easy cooking plans  Grace Pennington has agreed to follow up with our clinic in 2 weeks. She was informed of the importance of frequent follow up visits to maximize her success with intensive lifestyle modifications for her multiple health conditions.   OBESITY BEHAVIORAL INTERVENTION VISIT  Today's visit was # 2 out of 22.  Starting weight: 255 lbs Starting date: 10/07/17 Today's weight :  250 lbs Today's date: 10/21/2017 Total lbs lost to date: 5 (Patients must lose 7 lbs in the first 6 months to continue with counseling)   ASK: We discussed the diagnosis of obesity with Grace Pennington today and Brent agreed to give [place] permission to discuss obesity behavioral modification therapy today.  ASSESS: Grace Pennington has the diagnosis of obesity and her BMI today is 40.37 Grace Pennington is in the action stage of change   ADVISE: Grace Pennington was educated on the multiple health risks of obesity as well as the benefit of weight loss to improve her health. She was advised of the need for long term treatment and the importance of lifestyle modifications.  AGREE: Multiple dietary modification options and treatment options were discussed and  Grace Pennington agreed to the above obesity treatment plan.  I,  Doreene Nest, am acting as transcriptionist for Eber Jones, MD  I have reviewed the above documentation for accuracy and completeness, and I agree with the above. - Ilene Qua, MD

## 2017-10-27 MED FILL — CITALOPRAM HBR 10 MG TABLET: 10 | 90 days supply | Qty: 90 | Fill #2

## 2017-11-06 ENCOUNTER — Ambulatory Visit (INDEPENDENT_AMBULATORY_CARE_PROVIDER_SITE_OTHER): Payer: 59 | Admitting: Family Medicine

## 2017-11-06 VITALS — BP 115/81 | HR 69 | Temp 97.7°F | Ht 66.0 in | Wt 251.0 lb

## 2017-11-06 DIAGNOSIS — Z6841 Body Mass Index (BMI) 40.0 and over, adult: Secondary | ICD-10-CM | POA: Diagnosis not present

## 2017-11-06 DIAGNOSIS — R7303 Prediabetes: Secondary | ICD-10-CM

## 2017-11-06 DIAGNOSIS — E559 Vitamin D deficiency, unspecified: Secondary | ICD-10-CM

## 2017-11-06 NOTE — Progress Notes (Signed)
Office: [phone]  /  Fax: [phone]   HPI:   Chief Complaint: OBESITY Grace Pennington is here to discuss her progress with her obesity treatment plan. She is on the Category 3 plan and is following her eating plan approximately 100 % of the time. She states she is walking for 30 minutes 5 times per week. [name] really enjoying meal plan. Occasionally not eating food for meals. Planning to go to [facility] for 5 days with son for Spring break.  Her weight is 251 lb (113.9 kg) today and has gained 1 pound since her last visit. She has lost 4 lbs since starting treatment with [place].  Pre-Diabetes Grace Pennington has a diagnosis of pre-diabetes based on her elevated Hgb A1c and was informed this puts her at greater risk of developing diabetes. She is on metformin, no GI side effects noted and continues to work on diet and exercise to decrease risk of diabetes. She denies nausea or hypoglycemia.  Vitamin D Deficiency [name] has a diagnosis of vitamin D deficiency. She is currently taking prescription Vit D and denies nausea, vomiting or muscle weakness.  Hypertension [name] is a 47 y.o. female with hypertension. Ferrah' blood pressure is controlled today. She denies chest pain, chest pressure, or headache. She is working weight loss to help control her blood pressure with the goal of decreasing her risk of heart attack and stroke.  ALLERGIES: No Known Allergies  MEDICATIONS: Current Outpatient Medications on File Prior to Visit  Medication Sig Dispense Refill  . citalopram (CELEXA) 10 MG tablet Take 10 mg by mouth daily.  6  . hydrochlorothiazide (HYDRODIURIL) 25 MG tablet Take 25 mg by mouth daily.    . metFORMIN (GLUCOPHAGE) 500 MG tablet Take 1 tablet (500 mg total) by mouth daily with breakfast. 30 tablet 0  . pantoprazole (PROTONIX) 40 MG tablet Take 40 mg by mouth daily.    . ranitidine (ZANTAC) 150 MG capsule Take 150 mg by mouth 2 (two) times daily.    . Vitamin D, Ergocalciferol,  (DRISDOL) 50000 units CAPS capsule Take 1 capsule (50,000 Units total) by mouth every 7 (seven) days. 4 capsule 0   No current facility-administered medications on file prior to visit.     PAST MEDICAL HISTORY: Past Medical History:  Diagnosis Date  . Hypertension   . Palpitations   . Sleep apnea     PAST SURGICAL HISTORY: Past Surgical History:  Procedure Laterality Date  . TUBAL LIGATION      SOCIAL HISTORY: Social History   Tobacco Use  . Smoking status: Former Smoker    Last attempt to quit: 07/29/1996    Years since quitting: 21.2  . Smokeless tobacco: Never Used  Substance Use Topics  . Alcohol use: No  . Drug use: No    FAMILY HISTORY: Family History  Problem Relation Age of Onset  . Heart disease Mother   . Diabetes Mother   . Hyperlipidemia Mother   . Hypertension Mother   . Kidney failure Mother   . Diabetes Unknown   . Stroke Unknown   . Hypertension Unknown   . Kidney failure Unknown   . Coronary artery disease Unknown   . Sleep apnea Father   . Diabetes Father   . Hyperlipidemia Father   . Hypertension Father     ROS: Review of Systems  Constitutional: Negative for weight loss.  Cardiovascular: Negative for chest pain.       Negative chest pressure  Gastrointestinal: Negative for nausea  and vomiting.  Musculoskeletal:       Negative muscle weakness  Neurological: Negative for headaches.  Endo/Heme/Allergies:       Negative hypoglycemia    PHYSICAL EXAM: Blood pressure 115/81, pulse 69, temperature 97.7 F (36.5 C), temperature source Oral, height 5\' 6"  (1.676 m), weight 251 lb (113.9 kg), SpO2 100 %. Body mass index is 40.51 kg/m. Physical Exam  Constitutional: She is oriented to person, place, and time. She appears well-developed and well-nourished.  Cardiovascular: Normal rate.  Pulmonary/Chest: Effort normal.  Musculoskeletal: Normal range of motion.  Neurological: She is oriented to person, place, and time.  Skin: Skin is warm  and dry.  Psychiatric: She has a normal mood and affect. Her behavior is normal.  Vitals reviewed.   RECENT LABS AND TESTS: BMET    Component Value Date/Time   NA 139 10/07/2017 1200   K 3.9 10/07/2017 1200   CL 103 10/07/2017 1200   CO2 23 10/07/2017 1200   GLUCOSE 87 10/07/2017 1200   GLUCOSE 147 (H) 01/03/2017 1624   BUN 16 10/07/2017 1200   CREATININE 0.92 10/07/2017 1200   CALCIUM 9.3 10/07/2017 1200   GFRNONAA 75 10/07/2017 1200   GFRAA 87 10/07/2017 1200   Lab Results  Component Value Date   HGBA1C 6.4 (H) 10/07/2017   Lab Results  Component Value Date   INSULIN 14.3 10/07/2017   CBC    Component Value Date/Time   WBC 5.9 10/07/2017 1200   WBC 7.4 01/03/2017 1624   RBC 4.73 10/07/2017 1200   RBC 4.54 01/03/2017 1624   HGB 12.6 10/07/2017 1200   HCT 39.4 10/07/2017 1200   PLT 319 01/03/2017 1624   MCV 83 10/07/2017 1200   MCH 26.6 10/07/2017 1200   MCH 25.1 (L) 01/03/2017 1624   MCHC 32.0 10/07/2017 1200   MCHC 32.4 01/03/2017 1624   RDW 16.6 (H) 10/07/2017 1200   LYMPHSABS 2.8 10/07/2017 1200   MONOABS 0.9 07/22/2013 1515   EOSABS 0.2 10/07/2017 1200   BASOSABS 0.0 10/07/2017 1200   Iron/TIBC/Ferritin/ %Sat No results found for: IRON, TIBC, FERRITIN, IRONPCTSAT Lipid Panel     Component Value Date/Time   CHOL 184 10/07/2017 1200   TRIG 58 10/07/2017 1200   HDL 52 10/07/2017 1200   LDLCALC 120 (H) 10/07/2017 1200   Hepatic Function Panel     Component Value Date/Time   PROT 7.4 10/07/2017 1200   ALBUMIN 4.0 10/07/2017 1200   AST 19 10/07/2017 1200   ALT 22 10/07/2017 1200   ALKPHOS 73 10/07/2017 1200   BILITOT 0.2 10/07/2017 1200      Component Value Date/Time   TSH 1.730 10/07/2017 1200  Results for ARDA, DAGGS "ADJOA ALTHOUSE" (MRN [id number]) as of 11/06/2017 17:36  Ref. Range 10/07/2017 12:00  Vitamin D, 25-Hydroxy Latest Ref Range: 30.0 - 100.0 ng/mL 16.7 (L)    ASSESSMENT AND PLAN: Prediabetes  Vitamin D deficiency  Class 3  severe obesity with serious comorbidity and body mass index (BMI) of 40.0 to 44.9 in adult, unspecified obesity type ([place])  PLAN:  Pre-Diabetes Laniya will continue to work on weight loss, exercise, and decreasing simple carbohydrates in her diet to help decrease the risk of diabetes. We dicussed metformin including benefits and risks. She was informed that eating too many simple carbohydrates or too many calories at one sitting increases the likelihood of GI side effects. Shruthi agrees to continue taking metformin 500 mg PO q AM, no refill needed. Anysa agrees to follow  up with our clinic in 2 weeks as directed to monitor her progress.  Vitamin D Deficiency Deletha was informed that low vitamin D levels contributes to fatigue and are associated with obesity, breast, and colon cancer. Marquel agrees to continue taking prescription Vit D @50 ,000 IU every week #4, no refill needed. She will follow up for routine testing of vitamin D, at least 2-3 times per year. She was informed of the risk of over-replacement of vitamin D and agrees to not increase her dose unless she discusses this with [place] first. Jared agrees to follow up with our clinic in 2 weeks.  We spent > than 50% of the 15 minute visit on the counseling as documented in the note.  Obesity Takako is currently in the action stage of change. As such, her goal is to continue with weight loss efforts She has agreed to follow the Category 3 plan Lyssa has been instructed to work up to a goal of 150 minutes of combined cardio and strengthening exercise per week for weight loss and overall health benefits. We discussed the following Behavioral Modification Strategies today: increasing lean protein intake, increasing vegetables, work on meal planning and easy cooking plans, and planning for success   Chesni has agreed to follow up with our clinic in 2 weeks. She was informed of the importance of frequent follow up visits to maximize her success with  intensive lifestyle modifications for her multiple health conditions.   OBESITY BEHAVIORAL INTERVENTION VISIT  Today's visit was # 3 out of 22.  Starting weight: 255 lbs Starting date: 10/07/17 Today's weight : 251 lbs Today's date: 11/06/2017 Total lbs lost to date: 4 (Patients must lose 7 lbs in the first 6 months to continue with counseling)   ASK: We discussed the diagnosis of obesity with Reino Kent today and Rossetta agreed to give [place] permission to discuss obesity behavioral modification therapy today.  ASSESS: Lenia has the diagnosis of obesity and her BMI today is 40.53 Nirel is in the action stage of change   ADVISE: Eveny was educated on the multiple health risks of obesity as well as the benefit of weight loss to improve her health. She was advised of the need for long term treatment and the importance of lifestyle modifications.  AGREE: Multiple dietary modification options and treatment options were discussed and  Sanita agreed to the above obesity treatment plan.  Wilhemena Durie, am acting as transcriptionist for Ilene Qua, MD

## 2017-11-20 ENCOUNTER — Other Ambulatory Visit (INDEPENDENT_AMBULATORY_CARE_PROVIDER_SITE_OTHER): Payer: Self-pay | Admitting: Family Medicine

## 2017-11-20 DIAGNOSIS — R7303 Prediabetes: Secondary | ICD-10-CM

## 2017-11-24 ENCOUNTER — Ambulatory Visit (INDEPENDENT_AMBULATORY_CARE_PROVIDER_SITE_OTHER): Payer: 59 | Admitting: Family Medicine

## 2017-11-24 VITALS — BP 111/77 | HR 66 | Temp 97.9°F | Ht 66.0 in | Wt 246.0 lb

## 2017-11-24 DIAGNOSIS — E559 Vitamin D deficiency, unspecified: Secondary | ICD-10-CM

## 2017-11-24 DIAGNOSIS — R7303 Prediabetes: Secondary | ICD-10-CM | POA: Diagnosis not present

## 2017-11-24 DIAGNOSIS — Z9189 Other specified personal risk factors, not elsewhere classified: Secondary | ICD-10-CM

## 2017-11-24 DIAGNOSIS — Z6838 Body mass index (BMI) 38.0-38.9, adult: Secondary | ICD-10-CM | POA: Diagnosis not present

## 2017-11-24 MED ORDER — METFORMIN HCL 500 MG PO TABS: 500.0000 mg | ORAL_TABLET | Freq: Every day | ORAL | 0 refills | Status: DC

## 2017-11-24 MED ORDER — VITAMIN D (ERGOCALCIFEROL) 1.25 MG (50000 UNIT) PO CAPS: 50000.0000 [IU] | ORAL_CAPSULE | ORAL | 0 refills | Status: DC

## 2017-11-24 MED FILL — VIT D2 1.25 MG (50,000 UNIT: 1.25 MG | 28 days supply | Qty: 4 | Fill #0

## 2017-11-24 MED FILL — metFORMIN HCL 500 MG TABS: 500 | 30 days supply | Qty: 30 | Fill #0

## 2017-11-26 NOTE — Progress Notes (Signed)
Office: [phone]  /  Fax: [phone]   HPI:   Chief Complaint: OBESITY Grace Pennington is here to discuss her progress with her obesity treatment plan. She is on the Category 3 plan and is following her eating plan approximately 100 % of the time. She states she is walking for 30 minutes 7 times per week. Grace Pennington is doing really well on Category 3. Enjoying all food, occasionally hungry in the middle of the night.  Her weight is 246 lb (111.6 kg) today and has had a weight loss of 5 pounds over a period of 2 to 3 weeks since her last visit. She has lost 9 lbs since starting treatment with [place].  Vitamin D Deficiency Grace Pennington has a diagnosis of vitamin D deficiency. She is currently taking prescription Vit D and denies nausea, vomiting or muscle weakness.  At risk for osteopenia and osteoporosis Grace Pennington is at higher risk of osteopenia and osteoporosis due to vitamin D deficiency.   Pre-Diabetes Grace Pennington has a diagnosis of pre-diabetes based on her elevated Hgb A1c and was informed this puts her at greater risk of developing diabetes. She notes no GI upset or sweet cravings. She is taking metformin currently and continues to work on diet and exercise to decrease risk of diabetes. She denies nausea or hypoglycemia.  ALLERGIES: No Known Allergies  MEDICATIONS: Current Outpatient Medications on File Prior to Visit  Medication Sig Dispense Refill  . citalopram (CELEXA) 10 MG tablet Take 10 mg by mouth daily.  6  . hydrochlorothiazide (HYDRODIURIL) 25 MG tablet Take 25 mg by mouth daily.    . pantoprazole (PROTONIX) 40 MG tablet Take 40 mg by mouth daily.    . ranitidine (ZANTAC) 150 MG capsule Take 150 mg by mouth 2 (two) times daily.     No current facility-administered medications on file prior to visit.     PAST MEDICAL HISTORY: Past Medical History:  Diagnosis Date  . Hypertension   . Palpitations   . Sleep apnea     PAST SURGICAL HISTORY: Past Surgical History:  Procedure Laterality  Date  . TUBAL LIGATION      SOCIAL HISTORY: Social History   Tobacco Use  . Smoking status: Former Smoker    Last attempt to quit: 07/29/1996    Years since quitting: 21.3  . Smokeless tobacco: Never Used  Substance Use Topics  . Alcohol use: No  . Drug use: No    FAMILY HISTORY: Family History  Problem Relation Age of Onset  . Heart disease Mother   . Diabetes Mother   . Hyperlipidemia Mother   . Hypertension Mother   . Kidney failure Mother   . Diabetes Unknown   . Stroke Unknown   . Hypertension Unknown   . Kidney failure Unknown   . Coronary artery disease Unknown   . Sleep apnea Father   . Diabetes Father   . Hyperlipidemia Father   . Hypertension Father     ROS: Review of Systems  Constitutional: Positive for weight loss.  Gastrointestinal: Negative for nausea and vomiting.  Musculoskeletal:       Negative muscle weakness  Endo/Heme/Allergies:       Negative hypoglycemia    PHYSICAL EXAM: Blood pressure 111/77, pulse 66, temperature 97.9 F (36.6 C), height 5\' 6"  (1.676 m), weight 246 lb (111.6 kg), SpO2 97 %. Body mass index is 39.71 kg/m. Physical Exam  Constitutional: She is oriented to person, place, and time. She appears well-developed and well-nourished.  Cardiovascular: Normal rate.  Pulmonary/Chest: Effort normal.  Musculoskeletal: Normal range of motion.  Neurological: She is oriented to person, place, and time.  Skin: Skin is warm and dry.  Psychiatric: She has a normal mood and affect. Her behavior is normal.  Vitals reviewed.   RECENT LABS AND TESTS: BMET    Component Value Date/Time   NA 139 10/07/2017 1200   K 3.9 10/07/2017 1200   CL 103 10/07/2017 1200   CO2 23 10/07/2017 1200   GLUCOSE 87 10/07/2017 1200   GLUCOSE 147 (H) 01/03/2017 1624   BUN 16 10/07/2017 1200   CREATININE 0.92 10/07/2017 1200   CALCIUM 9.3 10/07/2017 1200   GFRNONAA 75 10/07/2017 1200   GFRAA 87 10/07/2017 1200   Lab Results  Component Value Date     HGBA1C 6.4 (H) 10/07/2017   Lab Results  Component Value Date   INSULIN 14.3 10/07/2017   CBC    Component Value Date/Time   WBC 5.9 10/07/2017 1200   WBC 7.4 01/03/2017 1624   RBC 4.73 10/07/2017 1200   RBC 4.54 01/03/2017 1624   HGB 12.6 10/07/2017 1200   HCT 39.4 10/07/2017 1200   PLT 319 01/03/2017 1624   MCV 83 10/07/2017 1200   MCH 26.6 10/07/2017 1200   MCH 25.1 (L) 01/03/2017 1624   MCHC 32.0 10/07/2017 1200   MCHC 32.4 01/03/2017 1624   RDW 16.6 (H) 10/07/2017 1200   LYMPHSABS 2.8 10/07/2017 1200   MONOABS 0.9 07/22/2013 1515   EOSABS 0.2 10/07/2017 1200   BASOSABS 0.0 10/07/2017 1200   Iron/TIBC/Ferritin/ %Sat No results found for: IRON, TIBC, FERRITIN, IRONPCTSAT Lipid Panel     Component Value Date/Time   CHOL 184 10/07/2017 1200   TRIG 58 10/07/2017 1200   HDL 52 10/07/2017 1200   LDLCALC 120 (H) 10/07/2017 1200   Hepatic Function Panel     Component Value Date/Time   PROT 7.4 10/07/2017 1200   ALBUMIN 4.0 10/07/2017 1200   AST 19 10/07/2017 1200   ALT 22 10/07/2017 1200   ALKPHOS 73 10/07/2017 1200   BILITOT 0.2 10/07/2017 1200      Component Value Date/Time   TSH 1.730 10/07/2017 1200  Results for Grace, Pennington "Grace Pennington" (MRN [id number]) as of 11/26/2017 13:04  Ref. Range 10/07/2017 12:00  Vitamin D, 25-Hydroxy Latest Ref Range: 30.0 - 100.0 ng/mL 16.7 (L)    ASSESSMENT AND PLAN: At risk for osteoporosis  Vitamin D deficiency - Plan: Vitamin D, Ergocalciferol, (DRISDOL) 50000 units CAPS capsule  Prediabetes - Plan: metFORMIN (GLUCOPHAGE) 500 MG tablet  Class 2 severe obesity with serious comorbidity and body mass index (BMI) of 38.0 to 38.9 in adult, unspecified obesity type ([place])  PLAN:  Vitamin D Deficiency Grace Pennington was informed that low vitamin D levels contributes to fatigue and are associated with obesity, breast, and colon cancer. Grace Pennington agrees to continue taking prescription Vit D @50 ,000 IU every week #4 and we will refill  for 1 month. She will follow up for routine testing of vitamin D, at least 2-3 times per year. She was informed of the risk of over-replacement of vitamin D and agrees to not increase her dose unless she discusses this with [place] first. Grace Pennington agrees to follow up with our clinic in 2 weeks.  At risk for osteopenia and osteoporosis Grace Pennington is at risk for osteopenia and osteoporsis due to her vitamin D deficiency. She was encouraged to take her vitamin D and follow her higher calcium diet and increase strengthening exercise to help  strengthen her bones and decrease her risk of osteopenia and osteoporosis.  Pre-Diabetes Grace Pennington will continue to work on weight loss, exercise, and decreasing simple carbohydrates in her diet to help decrease the risk of diabetes. We dicussed metformin including benefits and risks. She was informed that eating too many simple carbohydrates or too many calories at one sitting increases the likelihood of GI side effects. Grace Pennington agrees to continue taking metformin 500 mg PO q AM #30 and we will refill for 1 month. Grace Pennington agrees to follow up with our clinic in 2 weeks as directed to monitor her progress.  Obesity Grace Pennington is currently in the action stage of change. As such, her goal is to continue with weight loss efforts She has agreed to follow the Category 3 plan Grace Pennington has been instructed to work up to a goal of 150 minutes of combined cardio and strengthening exercise per week for weight loss and overall health benefits. We discussed the following Behavioral Modification Strategies today: increasing lean protein intake, increasing vegetables, work on meal planning and easy cooking plans, better snacking choices, and planning for success   Grace Pennington has agreed to follow up with our clinic in 2 weeks. She was informed of the importance of frequent follow up visits to maximize her success with intensive lifestyle modifications for her multiple health conditions.   OBESITY BEHAVIORAL  INTERVENTION VISIT  Today's visit was # 4 out of 22.  Starting weight: 255 lbs Starting date: 10/07/17 Today's weight : 246 lbs  Today's date: 11/24/2017 Total lbs lost to date: 9 (Patients must lose 7 lbs in the first 6 months to continue with counseling)   ASK: We discussed the diagnosis of obesity with Grace Pennington today and Grace Pennington agreed to give [place] permission to discuss obesity behavioral modification therapy today.  ASSESS: Grace Pennington has the diagnosis of obesity and her BMI today is 39.72 Grace Pennington is in the action stage of change   ADVISE: Grace Pennington was educated on the multiple health risks of obesity as well as the benefit of weight loss to improve her health. She was advised of the need for long term treatment and the importance of lifestyle modifications.  AGREE: Multiple dietary modification options and treatment options were discussed and  Grace Pennington agreed to the above obesity treatment plan.  I, Kloi Brodman, am acting as transcriptionist for Ilene Qua, MD  I have reviewed the above documentation for accuracy and completeness, and I agree with the above. - Ilene Qua, MD

## 2017-12-09 ENCOUNTER — Ambulatory Visit (INDEPENDENT_AMBULATORY_CARE_PROVIDER_SITE_OTHER): Payer: 59 | Admitting: Family Medicine

## 2017-12-09 VITALS — BP 122/81 | HR 88 | Temp 98.1°F | Ht 66.0 in | Wt 249.0 lb

## 2017-12-09 DIAGNOSIS — Z9189 Other specified personal risk factors, not elsewhere classified: Secondary | ICD-10-CM | POA: Diagnosis not present

## 2017-12-09 DIAGNOSIS — E559 Vitamin D deficiency, unspecified: Secondary | ICD-10-CM | POA: Diagnosis not present

## 2017-12-09 DIAGNOSIS — R7303 Prediabetes: Secondary | ICD-10-CM

## 2017-12-09 DIAGNOSIS — Z6841 Body Mass Index (BMI) 40.0 and over, adult: Secondary | ICD-10-CM

## 2017-12-09 MED ORDER — VITAMIN D (ERGOCALCIFEROL) 1.25 MG (50000 UNIT) PO CAPS: 50000.0000 [IU] | ORAL_CAPSULE | ORAL | 0 refills | Status: DC

## 2017-12-10 NOTE — Progress Notes (Signed)
Office: [phone]  /  Fax: [phone]   HPI:   Chief Complaint: OBESITY Grace Pennington is here to discuss her progress with her obesity treatment plan. She is on the Category 3 plan and is following her eating plan approximately 100 % of the time. She states she is walking for 30 minutes 5 times per week. Mackie followed meal plan to a T for last 2 weeks.  Her weight is 249 lb (112.9 kg) today and has gained 3 pounds since her last visit. She has lost 6 lbs since starting treatment with [place].  Pre-Diabetes Grace Pennington has a diagnosis of pre-diabetes based on her elevated Hgb A1c and was informed this puts her at greater risk of developing diabetes. She denies GI upset with metformin and continues to work on diet and exercise to decrease risk of diabetes. She denies nausea or hypoglycemia.  Vitamin D Deficiency Grace Pennington has a diagnosis of vitamin D deficiency. She is currently taking prescription Vit D and denies nausea, vomiting or muscle weakness.  At risk for osteopenia and osteoporosis Grace Pennington is at higher risk of osteopenia and osteoporosis due to vitamin D deficiency.   ALLERGIES: No Known Allergies  MEDICATIONS: Current Outpatient Medications on File Prior to Visit  Medication Sig Dispense Refill  . citalopram (CELEXA) 10 MG tablet Take 10 mg by mouth daily.  6  . hydrochlorothiazide (HYDRODIURIL) 25 MG tablet Take 25 mg by mouth daily.    . metFORMIN (GLUCOPHAGE) 500 MG tablet Take 1 tablet (500 mg total) by mouth daily with breakfast. 30 tablet 0  . pantoprazole (PROTONIX) 40 MG tablet Take 40 mg by mouth daily.    . ranitidine (ZANTAC) 150 MG capsule Take 150 mg by mouth 2 (two) times daily.     No current facility-administered medications on file prior to visit.     PAST MEDICAL HISTORY: Past Medical History:  Diagnosis Date  . Hypertension   . Palpitations   . Sleep apnea     PAST SURGICAL HISTORY: Past Surgical History:  Procedure Laterality Date  . TUBAL LIGATION       SOCIAL HISTORY: Social History   Tobacco Use  . Smoking status: Former Smoker    Last attempt to quit: 07/29/1996    Years since quitting: 21.3  . Smokeless tobacco: Never Used  Substance Use Topics  . Alcohol use: No  . Drug use: No    FAMILY HISTORY: Family History  Problem Relation Age of Onset  . Heart disease Mother   . Diabetes Mother   . Hyperlipidemia Mother   . Hypertension Mother   . Kidney failure Mother   . Diabetes Unknown   . Stroke Unknown   . Hypertension Unknown   . Kidney failure Unknown   . Coronary artery disease Unknown   . Sleep apnea Father   . Diabetes Father   . Hyperlipidemia Father   . Hypertension Father     ROS: Review of Systems  Constitutional: Negative for weight loss.  Gastrointestinal: Negative for nausea and vomiting.  Musculoskeletal:       Negative muscle weakness  Endo/Heme/Allergies:       Negative hypoglycemia    PHYSICAL EXAM: Blood pressure 122/81, pulse 88, temperature 98.1 F (36.7 C), temperature source Oral, height 5\' 6"  (1.676 m), weight 249 lb (112.9 kg), SpO2 96 %. Body mass index is 40.19 kg/m. Physical Exam  Constitutional: She is oriented to person, place, and time. She appears well-developed and well-nourished.  Cardiovascular: Normal rate.  Pulmonary/Chest: Effort  normal.  Musculoskeletal: Normal range of motion.  Neurological: She is oriented to person, place, and time.  Skin: Skin is warm and dry.  Psychiatric: She has a normal mood and affect. Her behavior is normal.  Vitals reviewed.   RECENT LABS AND TESTS: BMET    Component Value Date/Time   NA 139 10/07/2017 1200   K 3.9 10/07/2017 1200   CL 103 10/07/2017 1200   CO2 23 10/07/2017 1200   GLUCOSE 87 10/07/2017 1200   GLUCOSE 147 (H) 01/03/2017 1624   BUN 16 10/07/2017 1200   CREATININE 0.92 10/07/2017 1200   CALCIUM 9.3 10/07/2017 1200   GFRNONAA 75 10/07/2017 1200   GFRAA 87 10/07/2017 1200   Lab Results  Component Value Date    HGBA1C 6.4 (H) 10/07/2017   Lab Results  Component Value Date   INSULIN 14.3 10/07/2017   CBC    Component Value Date/Time   WBC 5.9 10/07/2017 1200   WBC 7.4 01/03/2017 1624   RBC 4.73 10/07/2017 1200   RBC 4.54 01/03/2017 1624   HGB 12.6 10/07/2017 1200   HCT 39.4 10/07/2017 1200   PLT 319 01/03/2017 1624   MCV 83 10/07/2017 1200   MCH 26.6 10/07/2017 1200   MCH 25.1 (L) 01/03/2017 1624   MCHC 32.0 10/07/2017 1200   MCHC 32.4 01/03/2017 1624   RDW 16.6 (H) 10/07/2017 1200   LYMPHSABS 2.8 10/07/2017 1200   MONOABS 0.9 07/22/2013 1515   EOSABS 0.2 10/07/2017 1200   BASOSABS 0.0 10/07/2017 1200   Iron/TIBC/Ferritin/ %Sat No results found for: IRON, TIBC, FERRITIN, IRONPCTSAT Lipid Panel     Component Value Date/Time   CHOL 184 10/07/2017 1200   TRIG 58 10/07/2017 1200   HDL 52 10/07/2017 1200   LDLCALC 120 (H) 10/07/2017 1200   Hepatic Function Panel     Component Value Date/Time   PROT 7.4 10/07/2017 1200   ALBUMIN 4.0 10/07/2017 1200   AST 19 10/07/2017 1200   ALT 22 10/07/2017 1200   ALKPHOS 73 10/07/2017 1200   BILITOT 0.2 10/07/2017 1200      Component Value Date/Time   TSH 1.730 10/07/2017 1200  Results for [employer], SETTLE "ANGINETTE ESPEJO" (MRN [id number]) as of 12/10/2017 12:08  Ref. Range 10/07/2017 12:00  Vitamin D, 25-Hydroxy Latest Ref Range: 30.0 - 100.0 ng/mL 16.7 (L)    ASSESSMENT AND PLAN: Prediabetes  Vitamin D deficiency - Plan: Vitamin D, Ergocalciferol, (DRISDOL) 50000 units CAPS capsule  At risk for osteoporosis  Class 3 severe obesity with serious comorbidity and body mass index (BMI) of 40.0 to 44.9 in adult, unspecified obesity type ([place])  PLAN:  Pre-Diabetes Grace Pennington will continue to work on weight loss, exercise, and decreasing simple carbohydrates in her diet to help decrease the risk of diabetes. We dicussed metformin including benefits and risks. She was informed that eating too many simple carbohydrates or too many calories at one  sitting increases the likelihood of GI side effects. Deniz agrees to continue taking metformin and she agrees to follow up with our clinic in 2 weeks as directed to monitor her progress.  Vitamin D Deficiency Grace Pennington was informed that low vitamin D levels contributes to fatigue and are associated with obesity, breast, and colon cancer. Grace Pennington agrees to continue taking prescription Vit D @50 ,000 IU every week #4 and we will refill for 1 month. She will follow up for routine testing of vitamin D, at least 2-3 times per year. She was informed of the risk of over-replacement  of vitamin D and agrees to not increase her dose unless she discusses this with [place] first. Grace Pennington agrees to follow up with our clinic in 2 weeks.  At risk for osteopenia and osteoporosis Grace Pennington is at risk for osteopenia and osteoporsis due to her vitamin D deficiency. She was encouraged to take her vitamin D and follow her higher calcium diet and increase strengthening exercise to help strengthen her bones and decrease her risk of osteopenia and osteoporosis.  Obesity Grace Pennington is currently in the action stage of change. As such, her goal is to continue with weight loss efforts She has agreed to keep a food journal with 500-650 calories and 45 grams of protein at supper daily and follow the Category 4 plan Grace Pennington has been instructed to work up to a goal of 150 minutes of combined cardio and strengthening exercise per week or resistance training for 10 minutes 2 times per week for weight loss and overall health benefits. We discussed the following Behavioral Modification Strategies today: increasing lean protein intake, increasing vegetables, work on meal planning and easy cooking plans, and planning for success   Grace Pennington has agreed to follow up with our clinic in 2 weeks. She was informed of the importance of frequent follow up visits to maximize her success with intensive lifestyle modifications for her multiple health conditions.   OBESITY  BEHAVIORAL INTERVENTION VISIT  Today's visit was # 5 out of 22.  Starting weight: 255 lbs Starting date: 10/07/17 Today's weight : 249 lbs Today's date: 12/09/2017 Total lbs lost to date: 6 (Patients must lose 7 lbs in the first 6 months to continue with counseling)   ASK: We discussed the diagnosis of obesity with Grace Pennington today and Grace Pennington agreed to give [place] permission to discuss obesity behavioral modification therapy today.  ASSESS: Kara has the diagnosis of obesity and her BMI today is 40.21 Alandra is in the action stage of change   ADVISE: Rocky was educated on the multiple health risks of obesity as well as the benefit of weight loss to improve her health. She was advised of the need for long term treatment and the importance of lifestyle modifications.  AGREE: Multiple dietary modification options and treatment options were discussed and  Ellana agreed to the above obesity treatment plan.  I, Grace Pennington, am acting as transcriptionist for Ilene Qua, MD  I have reviewed the above documentation for accuracy and completeness, and I agree with the above. - Ilene Qua, MD

## 2017-12-12 MED FILL — PANTOPRAZOLE SOD DR 40 MG T: 40 | 90 days supply | Qty: 90 | Fill #2

## 2017-12-12 MED FILL — HYDROCHLOROTHIAZIDE 25 MG T: 25 | 90 days supply | Qty: 90 | Fill #0

## 2017-12-23 ENCOUNTER — Ambulatory Visit (INDEPENDENT_AMBULATORY_CARE_PROVIDER_SITE_OTHER): Payer: 59 | Admitting: Physician Assistant

## 2017-12-30 ENCOUNTER — Ambulatory Visit (INDEPENDENT_AMBULATORY_CARE_PROVIDER_SITE_OTHER): Payer: 59 | Admitting: Family Medicine

## 2017-12-30 VITALS — BP 116/81 | HR 70 | Temp 98.2°F | Ht 66.0 in | Wt 248.0 lb

## 2017-12-30 DIAGNOSIS — R7303 Prediabetes: Secondary | ICD-10-CM | POA: Diagnosis not present

## 2017-12-30 DIAGNOSIS — E559 Vitamin D deficiency, unspecified: Secondary | ICD-10-CM | POA: Diagnosis not present

## 2017-12-30 DIAGNOSIS — Z9189 Other specified personal risk factors, not elsewhere classified: Secondary | ICD-10-CM

## 2017-12-30 DIAGNOSIS — E66813 Obesity, class 3: Secondary | ICD-10-CM

## 2017-12-30 DIAGNOSIS — K5909 Other constipation: Secondary | ICD-10-CM

## 2017-12-30 DIAGNOSIS — Z6841 Body Mass Index (BMI) 40.0 and over, adult: Secondary | ICD-10-CM | POA: Diagnosis not present

## 2017-12-30 MED ORDER — VITAMIN D (ERGOCALCIFEROL) 1.25 MG (50000 UNIT) PO CAPS: 50000.0000 [IU] | ORAL_CAPSULE | ORAL | 0 refills | Status: DC

## 2017-12-30 MED ORDER — METFORMIN HCL 500 MG PO TABS: 500.0000 mg | ORAL_TABLET | Freq: Every day | ORAL | 0 refills | Status: DC

## 2017-12-30 MED FILL — VIT D2 1.25 MG (50,000 UNIT: 1.25 MG | 28 days supply | Qty: 4 | Fill #0

## 2017-12-30 MED FILL — metFORMIN HCL 500 MG TABS: 500 | 30 days supply | Qty: 30 | Fill #0

## 2017-12-30 NOTE — Progress Notes (Signed)
Office: [phone]  /  Fax: [phone]   HPI:   Chief Complaint: OBESITY Grace Pennington is here to discuss her progress with her obesity treatment plan. She is on the keep a food journal with 500-650 calories and 45 grams of protein at supper daily and follow the Category 4 plan and is following her eating plan approximately 100 % of the time. She states she is using exercise bands for 10 minutes 5 times per week. Grace Pennington is enjoying meal plan but experiencing some constipation issue. Going to start playing softball in July.  Her weight is 248 lb (112.5 kg) today and has had a weight loss of 1 pound over a period of 3 weeks since her last visit. She has lost 7 lbs since starting treatment with [place].  Constipation Grace Pennington notes constipation for the last few weeks, worse since attempting weight loss. She states BM are less frequent and she brought stool softener. She denies pain. She denies hematochezia or melena. She denies drinking less H20 recently.  Vitamin D Deficiency Grace Pennington has a diagnosis of vitamin D deficiency. She is currently taking prescription Vit D. She notes fatigue and denies nausea, vomiting or muscle weakness.  Pre-Diabetes Grace Pennington has a diagnosis of pre-diabetes based on her elevated Hgb A1c and was informed this puts her at greater risk of developing diabetes. She is taking metformin currently and continues to work on diet and exercise to decrease risk of diabetes. She denies carbohydrate cravings or hypoglycemia.  At risk for diabetes Grace Pennington is at higher than average risk for developing diabetes due to her obesity and pre-diabetes. She currently denies polyuria or polydipsia.  ALLERGIES: No Known Allergies  MEDICATIONS: Current Outpatient Medications on File Prior to Visit  Medication Sig Dispense Refill  . citalopram (CELEXA) 10 MG tablet Take 10 mg by mouth daily.  6  . hydrochlorothiazide (HYDRODIURIL) 25 MG tablet Take 25 mg by mouth daily.    . pantoprazole (PROTONIX) 40  MG tablet Take 40 mg by mouth daily.    . ranitidine (ZANTAC) 150 MG capsule Take 150 mg by mouth 2 (two) times daily.     No current facility-administered medications on file prior to visit.     PAST MEDICAL HISTORY: Past Medical History:  Diagnosis Date  . Hypertension   . Palpitations   . Sleep apnea     PAST SURGICAL HISTORY: Past Surgical History:  Procedure Laterality Date  . TUBAL LIGATION      SOCIAL HISTORY: Social History   Tobacco Use  . Smoking status: Former Smoker    Last attempt to quit: 07/29/1996    Years since quitting: 21.4  . Smokeless tobacco: Never Used  Substance Use Topics  . Alcohol use: No  . Drug use: No    FAMILY HISTORY: Family History  Problem Relation Age of Onset  . Heart disease Mother   . Diabetes Mother   . Hyperlipidemia Mother   . Hypertension Mother   . Kidney failure Mother   . Diabetes Unknown   . Stroke Unknown   . Hypertension Unknown   . Kidney failure Unknown   . Coronary artery disease Unknown   . Sleep apnea Father   . Diabetes Father   . Hyperlipidemia Father   . Hypertension Father     ROS: Review of Systems  Constitutional: Positive for malaise/fatigue and weight loss.  Gastrointestinal: Positive for constipation. Negative for nausea and vomiting.  Genitourinary: Negative for frequency.  Musculoskeletal:       Negative  muscle weakness  Endo/Heme/Allergies: Negative for polydipsia.       Negative hypoglycemia    PHYSICAL EXAM: Blood pressure 116/81, pulse 70, temperature 98.2 F (36.8 C), temperature source Oral, height 5\' 6"  (1.676 m), weight 248 lb (112.5 kg), SpO2 96 %. Body mass index is 40.03 kg/m. Physical Exam  Constitutional: She is oriented to person, place, and time. She appears well-developed and well-nourished.  Cardiovascular: Normal rate.  Pulmonary/Chest: Effort normal.  Musculoskeletal: Normal range of motion.  Neurological: She is oriented to person, place, and time.  Skin: Skin is  warm and dry.  Psychiatric: She has a normal mood and affect. Her behavior is normal.  Vitals reviewed.   RECENT LABS AND TESTS: BMET    Component Value Date/Time   NA 139 10/07/2017 1200   K 3.9 10/07/2017 1200   CL 103 10/07/2017 1200   CO2 23 10/07/2017 1200   GLUCOSE 87 10/07/2017 1200   GLUCOSE 147 (H) 01/03/2017 1624   BUN 16 10/07/2017 1200   CREATININE 0.92 10/07/2017 1200   CALCIUM 9.3 10/07/2017 1200   GFRNONAA 75 10/07/2017 1200   GFRAA 87 10/07/2017 1200   Lab Results  Component Value Date   HGBA1C 6.4 (H) 10/07/2017   Lab Results  Component Value Date   INSULIN 14.3 10/07/2017   CBC    Component Value Date/Time   WBC 5.9 10/07/2017 1200   WBC 7.4 01/03/2017 1624   RBC 4.73 10/07/2017 1200   RBC 4.54 01/03/2017 1624   HGB 12.6 10/07/2017 1200   HCT 39.4 10/07/2017 1200   PLT 319 01/03/2017 1624   MCV 83 10/07/2017 1200   MCH 26.6 10/07/2017 1200   MCH 25.1 (L) 01/03/2017 1624   MCHC 32.0 10/07/2017 1200   MCHC 32.4 01/03/2017 1624   RDW 16.6 (H) 10/07/2017 1200   LYMPHSABS 2.8 10/07/2017 1200   MONOABS 0.9 07/22/2013 1515   EOSABS 0.2 10/07/2017 1200   BASOSABS 0.0 10/07/2017 1200   Iron/TIBC/Ferritin/ %Sat No results found for: IRON, TIBC, FERRITIN, IRONPCTSAT Lipid Panel     Component Value Date/Time   CHOL 184 10/07/2017 1200   TRIG 58 10/07/2017 1200   HDL 52 10/07/2017 1200   LDLCALC 120 (H) 10/07/2017 1200   Hepatic Function Panel     Component Value Date/Time   PROT 7.4 10/07/2017 1200   ALBUMIN 4.0 10/07/2017 1200   AST 19 10/07/2017 1200   ALT 22 10/07/2017 1200   ALKPHOS 73 10/07/2017 1200   BILITOT 0.2 10/07/2017 1200      Component Value Date/Time   TSH 1.730 10/07/2017 1200  Results for Grace Pennington, Grace Pennington "Grace Pennington" (MRN [id number]) as of 12/30/2017 16:24  Ref. Range 10/07/2017 12:00  Vitamin D, 25-Hydroxy Latest Ref Range: 30.0 - 100.0 ng/mL 16.7 (L)    ASSESSMENT AND PLAN: Other constipation  Vitamin D deficiency  - Plan: Vitamin D, Ergocalciferol, (DRISDOL) 50000 units CAPS capsule  Prediabetes - Plan: metFORMIN (GLUCOPHAGE) 500 MG tablet  At risk for diabetes mellitus  Class 3 severe obesity with serious comorbidity and body mass index (BMI) of 40.0 to 44.9 in adult, unspecified obesity type ([place])  PLAN:  Constipation Grace Pennington was informed decrease bowel movement frequency is normal while losing weight, but stools should not be hard or painful. She was advised to increase her H20 intake and work on increasing her fiber intake. High fiber foods were discussed today. Grace Pennington agrees to start Metamucil and Benefiber for 3 to 4 days, if no relief to try  miralax for 3 to 4 days. Grace Pennington agrees to follow up with our clinic in 2 weeks.  Vitamin D Deficiency Grace Pennington was informed that low vitamin D levels contributes to fatigue and are associated with obesity, breast, and colon cancer. Grace Pennington agrees to continue taking prescription Vit D @50 ,000 IU every week #4 and we will refill for 1 month. She will follow up for routine testing of vitamin D, at least 2-3 times per year. She was informed of the risk of over-replacement of vitamin D and agrees to not increase her dose unless she discusses this with [place] first. Grace Pennington agrees to follow up with our clinic in 2 weeks.  Pre-Diabetes Grace Pennington will continue to work on weight loss, exercise, and decreasing simple carbohydrates in her diet to help decrease the risk of diabetes. We dicussed metformin including benefits and risks. She was informed that eating too many simple carbohydrates or too many calories at one sitting increases the likelihood of GI side effects. Grace Pennington agrees to continue metformin 500 mg PO q AM #30 and we will refill for 1 month. Grace Pennington agrees to follow up with our clinic in 2 weeks as directed to monitor her progress.  Diabetes risk counselling Grace Pennington was given extended (15 minutes) diabetes prevention counseling today. She is 47 y.o. female and has risk factors for  diabetes including obesity and pre-diabetes. We discussed intensive lifestyle modifications today with an emphasis on weight loss as well as increasing exercise and decreasing simple carbohydrates in her diet.  Obesity Grace Pennington is currently in the action stage of change. As such, her goal is to continue with weight loss efforts She has agreed to follow the Category 4 plan Grace Pennington has been instructed to work up to a goal of 150 minutes of combined cardio and strengthening exercise per week for weight loss and overall health benefits. We discussed the following Behavioral Modification Strategies today: increasing lean protein intake, increase H20 intake, work on meal planning and easy cooking plans, better snacking choices, and planning for success   Grace Pennington has agreed to follow up with our clinic in 2 weeks. She was informed of the importance of frequent follow up visits to maximize her success with intensive lifestyle modifications for her multiple health conditions.   OBESITY BEHAVIORAL INTERVENTION VISIT  Today's visit was # 6 out of 22.  Starting weight: 255 lbs Starting date: 10/07/17 Today's weight : 248 lbs  Today's date: 12/30/2017 Total lbs lost to date: 17 (Patients must lose 7 lbs in the first 6 months to continue with counseling)   ASK: We discussed the diagnosis of obesity with Grace Pennington today and Grace Pennington agreed to give [place] permission to discuss obesity behavioral modification therapy today.  ASSESS: Grace Pennington has the diagnosis of obesity and her BMI today is 40.05 Grace Pennington is in the action stage of change   ADVISE: Grace Pennington was educated on the multiple health risks of obesity as well as the benefit of weight loss to improve her health. She was advised of the need for long term treatment and the importance of lifestyle modifications.  AGREE: Multiple dietary modification options and treatment options were discussed and  Grace Pennington agreed to the above obesity treatment plan.  I, Elysse Polidore, am acting as transcriptionist for Ilene Qua, MD  I have reviewed the above documentation for accuracy and completeness, and I agree with the above. - Ilene Qua, MD

## 2018-01-13 ENCOUNTER — Ambulatory Visit (INDEPENDENT_AMBULATORY_CARE_PROVIDER_SITE_OTHER): Payer: 59 | Admitting: Physician Assistant

## 2018-01-13 VITALS — BP 103/69 | HR 64 | Temp 98.4°F | Ht 66.0 in | Wt 248.0 lb

## 2018-01-13 DIAGNOSIS — E7849 Other hyperlipidemia: Secondary | ICD-10-CM | POA: Diagnosis not present

## 2018-01-13 DIAGNOSIS — Z6841 Body Mass Index (BMI) 40.0 and over, adult: Secondary | ICD-10-CM

## 2018-01-13 DIAGNOSIS — Z9189 Other specified personal risk factors, not elsewhere classified: Secondary | ICD-10-CM | POA: Diagnosis not present

## 2018-01-13 DIAGNOSIS — E559 Vitamin D deficiency, unspecified: Secondary | ICD-10-CM

## 2018-01-13 DIAGNOSIS — E66813 Obesity, class 3: Secondary | ICD-10-CM

## 2018-01-13 MED ORDER — VITAMIN D (ERGOCALCIFEROL) 1.25 MG (50000 UNIT) PO CAPS: 50000.0000 [IU] | ORAL_CAPSULE | ORAL | 0 refills | Status: DC

## 2018-01-14 NOTE — Progress Notes (Signed)
Office: [phone]  /  Fax: [phone]   HPI:   Chief Complaint: OBESITY Grace Pennington is here to discuss her progress with her obesity treatment plan. She is on the Category 4 plan and is following her eating plan approximately 100 % of the time. She states she is doing resistance bands for 10 minutes 5 times per week. Grace Pennington maintained her weight. Grace Pennington states she follows the meal plan, but she might not be getting all the recommended protein. Her weight is 248 lb (112.5 kg) today and has maintained weight over a period of 2 weeks since her last visit. She has lost 7 lbs since starting treatment with [place].  Vitamin D deficiency Grace Pennington has a diagnosis of vitamin D deficiency. She is currently taking vit D and denies nausea, vomiting or muscle weakness.  Hyperlipidemia Grace Pennington has hyperlipidemia. Her LDL is > 100. Grace Pennington is not on medications and she declines medications at this time. Grace Pennington has been trying to improve her cholesterol levels with intensive lifestyle modification including a low saturated fat diet, exercise and weight loss. She denies any chest pain, claudication or myalgias.  At risk for cardiovascular disease Grace Pennington is at a higher than average risk for cardiovascular disease due to obesity and hyperlipidemia. She currently denies any chest pain.  ALLERGIES: No Known Allergies  MEDICATIONS: Current Outpatient Medications on File Prior to Visit  Medication Sig Dispense Refill  . citalopram (CELEXA) 10 MG tablet Take 10 mg by mouth daily.  6  . hydrochlorothiazide (HYDRODIURIL) 25 MG tablet Take 25 mg by mouth daily.    . metFORMIN (GLUCOPHAGE) 500 MG tablet Take 1 tablet (500 mg total) by mouth daily with breakfast. 30 tablet 0  . pantoprazole (PROTONIX) 40 MG tablet Take 40 mg by mouth daily.    . ranitidine (ZANTAC) 150 MG capsule Take 150 mg by mouth 2 (two) times daily.     No current facility-administered medications on file prior to visit.     PAST MEDICAL HISTORY: Past  Medical History:  Diagnosis Date  . Hypertension   . Palpitations   . Sleep apnea     PAST SURGICAL HISTORY: Past Surgical History:  Procedure Laterality Date  . TUBAL LIGATION      SOCIAL HISTORY: Social History   Tobacco Use  . Smoking status: Former Smoker    Last attempt to quit: 07/29/1996    Years since quitting: 21.4  . Smokeless tobacco: Never Used  Substance Use Topics  . Alcohol use: No  . Drug use: No    FAMILY HISTORY: Family History  Problem Relation Age of Onset  . Heart disease Mother   . Diabetes Mother   . Hyperlipidemia Mother   . Hypertension Mother   . Kidney failure Mother   . Diabetes Unknown   . Stroke Unknown   . Hypertension Unknown   . Kidney failure Unknown   . Coronary artery disease Unknown   . Sleep apnea Father   . Diabetes Father   . Hyperlipidemia Father   . Hypertension Father     ROS: Review of Systems  Constitutional: Negative for weight loss.  Cardiovascular: Negative for chest pain and claudication.  Gastrointestinal: Negative for nausea and vomiting.  Musculoskeletal: Negative for myalgias.       Negative for muscle weakness    PHYSICAL EXAM: Blood pressure 103/69, pulse 64, temperature 98.4 F (36.9 C), height 5\' 6"  (1.676 m), weight 248 lb (112.5 kg), SpO2 98 %. Body mass index is 40.03 kg/m. Physical  Exam  Constitutional: She is oriented to person, place, and time. She appears well-developed and well-nourished.  Cardiovascular: Normal rate.  Pulmonary/Chest: Effort normal.  Musculoskeletal: Normal range of motion.  Neurological: She is oriented to person, place, and time.  Skin: Skin is warm and dry.  Psychiatric: She has a normal mood and affect. Her behavior is normal.  Vitals reviewed.   RECENT LABS AND TESTS: BMET    Component Value Date/Time   NA 139 10/07/2017 1200   K 3.9 10/07/2017 1200   CL 103 10/07/2017 1200   CO2 23 10/07/2017 1200   GLUCOSE 87 10/07/2017 1200   GLUCOSE 147 (H)  01/03/2017 1624   BUN 16 10/07/2017 1200   CREATININE 0.92 10/07/2017 1200   CALCIUM 9.3 10/07/2017 1200   GFRNONAA 75 10/07/2017 1200   GFRAA 87 10/07/2017 1200   Lab Results  Component Value Date   HGBA1C 6.4 (H) 10/07/2017   Lab Results  Component Value Date   INSULIN 14.3 10/07/2017   CBC    Component Value Date/Time   WBC 5.9 10/07/2017 1200   WBC 7.4 01/03/2017 1624   RBC 4.73 10/07/2017 1200   RBC 4.54 01/03/2017 1624   HGB 12.6 10/07/2017 1200   HCT 39.4 10/07/2017 1200   PLT 319 01/03/2017 1624   MCV 83 10/07/2017 1200   MCH 26.6 10/07/2017 1200   MCH 25.1 (L) 01/03/2017 1624   MCHC 32.0 10/07/2017 1200   MCHC 32.4 01/03/2017 1624   RDW 16.6 (H) 10/07/2017 1200   LYMPHSABS 2.8 10/07/2017 1200   MONOABS 0.9 07/22/2013 1515   EOSABS 0.2 10/07/2017 1200   BASOSABS 0.0 10/07/2017 1200   Iron/TIBC/Ferritin/ %Sat No results found for: IRON, TIBC, FERRITIN, IRONPCTSAT Lipid Panel     Component Value Date/Time   CHOL 184 10/07/2017 1200   TRIG 58 10/07/2017 1200   HDL 52 10/07/2017 1200   LDLCALC 120 (H) 10/07/2017 1200   Hepatic Function Panel     Component Value Date/Time   PROT 7.4 10/07/2017 1200   ALBUMIN 4.0 10/07/2017 1200   AST 19 10/07/2017 1200   ALT 22 10/07/2017 1200   ALKPHOS 73 10/07/2017 1200   BILITOT 0.2 10/07/2017 1200      Component Value Date/Time   TSH 1.730 10/07/2017 1200   Results for Grace, Pennington "Grace Pennington SRINIVASAN" (MRN [id number]) as of 01/14/2018 09:53  Ref. Range 10/07/2017 12:00  Vitamin D, 25-Hydroxy Latest Ref Range: 30.0 - 100.0 ng/mL 16.7 (L)   ASSESSMENT AND PLAN: Vitamin D deficiency - Plan: Vitamin D, Ergocalciferol, (DRISDOL) 50000 units CAPS capsule  Other hyperlipidemia  At risk for heart disease  Class 3 severe obesity with serious comorbidity and body mass index (BMI) of 40.0 to 44.9 in adult, unspecified obesity type ([place])  PLAN:  Vitamin D Deficiency Grace Pennington was informed that low vitamin D levels  contributes to fatigue and are associated with obesity, breast, and colon cancer. She agrees to continue to take prescription Vit D @50 ,000 IU every week #4 with no refills and will follow up for routine testing of vitamin D, at least 2-3 times per year. She was informed of the risk of over-replacement of vitamin D and agrees to not increase her dose unless she discusses this with [place] first. Grace Pennington agrees to follow up as directed.  Hyperlipidemia Grace Pennington was informed of the American Heart Association Guidelines emphasizing intensive lifestyle modifications as the first line treatment for hyperlipidemia. We discussed many lifestyle modifications today in depth, and Grace Pennington will  continue to work on decreasing saturated fats such as fatty red meat, butter and many fried foods. She will also increase vegetables and lean protein in her diet and continue to work on exercise and weight loss efforts.  Cardiovascular risk counseling Grace Pennington was given extended (15 minutes) coronary artery disease prevention counseling today. She is 47 y.o. female and has risk factors for heart disease including obesity and hyperlipidemia. We discussed intensive lifestyle modifications today with an emphasis on specific weight loss instructions and strategies. Pt was also informed of the importance of increasing exercise and decreasing saturated fats to help prevent heart disease.  Obesity Grace Pennington is currently in the action stage of change. As such, her goal is to continue with weight loss efforts She has agreed to follow the Category 3 plan Grace Pennington has been instructed to work up to a goal of 150 minutes of combined cardio and strengthening exercise per week for weight loss and overall health benefits. We discussed the following Behavioral Modification Strategies today: increasing lean protein intake and work on meal planning and easy cooking plans  Grace Pennington has agreed to follow up with our clinic in 2 weeks. She was informed of the importance  of frequent follow up visits to maximize her success with intensive lifestyle modifications for her multiple health conditions.   OBESITY BEHAVIORAL INTERVENTION VISIT  Today's visit was # 7 out of 22.  Starting weight: 255 lbs Starting date: 10/07/17 Today's weight : 248 lbs  Today's date: 01/13/2018 Total lbs lost to date: 7 (Patients must lose 7 lbs in the first 6 months to continue with counseling)   ASK: We discussed the diagnosis of obesity with Grace Pennington today and Grace Pennington agreed to give [place] permission to discuss obesity behavioral modification therapy today.  ASSESS: Grace Pennington has the diagnosis of obesity and her BMI today is 40.05 Grace Pennington is in the action stage of change   ADVISE: Grace Pennington was educated on the multiple health risks of obesity as well as the benefit of weight loss to improve her health. She was advised of the need for long term treatment and the importance of lifestyle modifications.  AGREE: Multiple dietary modification options and treatment options were discussed and  Grace Pennington agreed to the above obesity treatment plan.   Corey Skains, am acting as transcriptionist for [employer], PA-C I, Lacy Duverney [facility], have reviewed this note and agree with its content

## 2018-01-15 ENCOUNTER — Ambulatory Visit (INDEPENDENT_AMBULATORY_CARE_PROVIDER_SITE_OTHER): Payer: 59 | Admitting: Family Medicine

## 2018-01-28 ENCOUNTER — Ambulatory Visit (INDEPENDENT_AMBULATORY_CARE_PROVIDER_SITE_OTHER): Payer: 59 | Admitting: Family Medicine

## 2018-01-28 VITALS — BP 112/78 | HR 69 | Temp 97.8°F | Ht 66.0 in | Wt 250.0 lb

## 2018-01-28 DIAGNOSIS — Z9189 Other specified personal risk factors, not elsewhere classified: Secondary | ICD-10-CM

## 2018-01-28 DIAGNOSIS — R7303 Prediabetes: Secondary | ICD-10-CM | POA: Diagnosis not present

## 2018-01-28 DIAGNOSIS — E66813 Obesity, class 3: Secondary | ICD-10-CM

## 2018-01-28 DIAGNOSIS — Z6841 Body Mass Index (BMI) 40.0 and over, adult: Secondary | ICD-10-CM | POA: Diagnosis not present

## 2018-01-28 DIAGNOSIS — E559 Vitamin D deficiency, unspecified: Secondary | ICD-10-CM | POA: Diagnosis not present

## 2018-01-28 MED ORDER — VITAMIN D (ERGOCALCIFEROL) 1.25 MG (50000 UNIT) PO CAPS: 50000.0000 [IU] | ORAL_CAPSULE | ORAL | 0 refills | Status: DC

## 2018-01-28 MED FILL — VIT D2 1.25 MG (50,000 UNIT: 1.25 MG | 28 days supply | Qty: 4 | Fill #0

## 2018-01-28 NOTE — Progress Notes (Signed)
Office: [phone]  /  Fax: [phone]   HPI:   Chief Complaint: OBESITY Grace Pennington is here to discuss her progress with her obesity treatment plan. She is on the Category 3 plan and is following her eating plan approximately 90 % of the time. She states she is walking and using resistance bands for 30 minutes 5 times per week. Grace Pennington just celebrated her birthday and only indulged 1 meal. Feels she is plateauing with weight loss. She notes hunger most of the day. Her weight is 250 lb (113.4 kg) today and has gained 2 pounds since her last visit. She has lost 5 lbs since starting treatment with Grace Pennington.  Vitamin D Deficiency Grace Pennington has a diagnosis of vitamin D deficiency. She is currently taking prescription Vit D. She notes fatigue and denies nausea, vomiting or muscle weakness.  At risk for osteopenia and osteoporosis Grace Pennington is at higher risk of osteopenia and osteoporosis due to vitamin D deficiency.   Pre-Diabetes Grace Pennington has a diagnosis of pre-diabetes based on her elevated Hgb A1c and was informed this puts her at greater risk of developing diabetes. She denies carbohydrate cravings, hypoglycemia, or GI side effects of metformin. She continues to work on diet and exercise to decrease risk of diabetes.  ALLERGIES: No Known Allergies  MEDICATIONS: Current Outpatient Medications on File Prior to Visit  Medication Sig Dispense Refill  . citalopram (CELEXA) 10 MG tablet Take 10 mg by mouth daily.  6  . hydrochlorothiazide (HYDRODIURIL) 25 MG tablet Take 25 mg by mouth daily.    . metFORMIN (GLUCOPHAGE) 500 MG tablet Take 1 tablet (500 mg total) by mouth daily with breakfast. 30 tablet 0  . pantoprazole (PROTONIX) 40 MG tablet Take 40 mg by mouth daily.    . ranitidine (ZANTAC) 150 MG capsule Take 150 mg by mouth 2 (two) times daily.     No current facility-administered medications on file prior to visit.     PAST MEDICAL HISTORY: Past Medical History:  Diagnosis Date  . Hypertension     . Palpitations   . Sleep apnea     PAST SURGICAL HISTORY: Past Surgical History:  Procedure Laterality Date  . TUBAL LIGATION      SOCIAL HISTORY: Social History   Tobacco Use  . Smoking status: Former Smoker    Last attempt to quit: 07/29/1996    Years since quitting: 21.5  . Smokeless tobacco: Never Used  Substance Use Topics  . Alcohol use: No  . Drug use: No    FAMILY HISTORY: Family History  Problem Relation Age of Onset  . Heart disease Mother   . Diabetes Mother   . Hyperlipidemia Mother   . Hypertension Mother   . Kidney failure Mother   . Diabetes Unknown   . Stroke Unknown   . Hypertension Unknown   . Kidney failure Unknown   . Coronary artery disease Unknown   . Sleep apnea Father   . Diabetes Father   . Hyperlipidemia Father   . Hypertension Father     ROS: Review of Systems  Constitutional: Positive for malaise/fatigue. Negative for weight loss.  Gastrointestinal: Negative for nausea and vomiting.  Musculoskeletal:       Negative muscle weakness  Endo/Heme/Allergies:       Negative hypoglycemia    PHYSICAL EXAM: Blood pressure 112/78, pulse 69, temperature 97.8 F (36.6 C), temperature source Oral, height 5\' 6"  (1.676 m), weight 250 lb (113.4 kg), SpO2 97 %. Body mass index is 40.35 kg/m. Physical  Exam  Constitutional: She is oriented to person, place, and time. She appears well-developed and well-nourished.  Cardiovascular: Normal rate.  Pulmonary/Chest: Effort normal.  Musculoskeletal: Normal range of motion.  Neurological: She is oriented to person, place, and time.  Skin: Skin is warm and dry.  Psychiatric: She has a normal mood and affect. Her behavior is normal.  Vitals reviewed.   RECENT LABS AND TESTS: BMET    Component Value Date/Time   NA 139 10/07/2017 1200   K 3.9 10/07/2017 1200   CL 103 10/07/2017 1200   CO2 23 10/07/2017 1200   GLUCOSE 87 10/07/2017 1200   GLUCOSE 147 (H) 01/03/2017 1624   BUN 16 10/07/2017 1200    CREATININE 0.92 10/07/2017 1200   CALCIUM 9.3 10/07/2017 1200   GFRNONAA 75 10/07/2017 1200   GFRAA 87 10/07/2017 1200   Lab Results  Component Value Date   HGBA1C 6.4 (H) 10/07/2017   Lab Results  Component Value Date   INSULIN 14.3 10/07/2017   CBC    Component Value Date/Time   WBC 5.9 10/07/2017 1200   WBC 7.4 01/03/2017 1624   RBC 4.73 10/07/2017 1200   RBC 4.54 01/03/2017 1624   HGB 12.6 10/07/2017 1200   HCT 39.4 10/07/2017 1200   PLT 319 01/03/2017 1624   MCV 83 10/07/2017 1200   MCH 26.6 10/07/2017 1200   MCH 25.1 (L) 01/03/2017 1624   MCHC 32.0 10/07/2017 1200   MCHC 32.4 01/03/2017 1624   RDW 16.6 (H) 10/07/2017 1200   LYMPHSABS 2.8 10/07/2017 1200   MONOABS 0.9 07/22/2013 1515   EOSABS 0.2 10/07/2017 1200   BASOSABS 0.0 10/07/2017 1200   Iron/TIBC/Ferritin/ %Sat No results found for: IRON, TIBC, FERRITIN, IRONPCTSAT Lipid Panel     Component Value Date/Time   CHOL 184 10/07/2017 1200   TRIG 58 10/07/2017 1200   HDL 52 10/07/2017 1200   LDLCALC 120 (H) 10/07/2017 1200   Hepatic Function Panel     Component Value Date/Time   PROT 7.4 10/07/2017 1200   ALBUMIN 4.0 10/07/2017 1200   AST 19 10/07/2017 1200   ALT 22 10/07/2017 1200   ALKPHOS 73 10/07/2017 1200   BILITOT 0.2 10/07/2017 1200      Component Value Date/Time   TSH 1.730 10/07/2017 1200  Results for HAILA, DENA "HIYAB NHEM" (MRN [id number]) as of 01/28/2018 12:35  Ref. Range 10/07/2017 12:00  Vitamin D, 25-Hydroxy Latest Ref Range: 30.0 - 100.0 ng/mL 16.7 (L)    ASSESSMENT AND PLAN: Vitamin D deficiency - Plan: Vitamin D, Ergocalciferol, (DRISDOL) 50000 units CAPS capsule  Prediabetes  At risk for osteoporosis  Class 3 severe obesity with serious comorbidity and body mass index (BMI) of 40.0 to 44.9 in adult, unspecified obesity type ([place])  PLAN:  Vitamin D Deficiency Grace Pennington was informed that low vitamin D levels contributes to fatigue and are associated with obesity,  breast, and colon cancer. Grace Pennington agrees to continue taking prescription Vit D @50 ,000 IU every week #4 and we will refill for 1 month. She will follow up for routine testing of vitamin D, at least 2-3 times per year. She was informed of the risk of over-replacement of vitamin D and agrees to not increase her dose unless she discusses this with Grace Pennington first. Grace Pennington agrees to follow up with our clinic in 2 weeks.  At risk for osteopenia and osteoporosis Grace Pennington is at risk for osteopenia and osteoporsis due to her vitamin D deficiency. She was encouraged to take her vitamin  D and follow her higher calcium diet and increase strengthening exercise to help strengthen her bones and decrease her risk of osteopenia and osteoporosis.  Pre-Diabetes Grace Pennington will continue to work on weight loss, exercise, and decreasing simple carbohydrates in her diet to help decrease the risk of diabetes. We dicussed metformin including benefits and risks. She was informed that eating too many simple carbohydrates or too many calories at one sitting increases the likelihood of GI side effects. Grace Pennington agrees to continue taking metformin BID and we will recheck labs at next visit. Grace Pennington agrees to follow up with our clinic in 2 weeks as directed to monitor her progress.  Obesity Grace Pennington is currently in the action stage of change. As such, her goal is to continue with weight loss efforts She has agreed to follow the Category 3 plan + 200 calories Grace Pennington has been instructed to work up to a goal of 150 minutes of combined cardio and strengthening exercise per week for weight loss and overall health benefits. We discussed the following Behavioral Modification Strategies today: increasing lean protein intake, increasing vegetables, work on meal planning and easy cooking plans, and planning for success   Grace Pennington has agreed to follow up with our clinic in 2 weeks. She was informed of the importance of frequent follow up visits to maximize her success  with intensive lifestyle modifications for her multiple health conditions.   OBESITY BEHAVIORAL INTERVENTION VISIT  Today's visit was # 8 out of 22.  Starting weight: 255 lbs Starting date: 10/07/17 Today's weight : 250 lbs  Today's date: 01/28/2018 Total lbs lost to date: 5 (Patients must lose 7 lbs in the first 6 months to continue with counseling)   ASK: We discussed the diagnosis of obesity with Grace Pennington today and Grace Pennington agreed to give Grace Pennington permission to discuss obesity behavioral modification therapy today.  ASSESS: Grace Pennington has the diagnosis of obesity and her BMI today is 40.37 Grace Pennington is in the action stage of change   ADVISE: Grace Pennington was educated on the multiple health risks of obesity as well as the benefit of weight loss to improve her health. She was advised of the need for long term treatment and the importance of lifestyle modifications.  AGREE: Multiple dietary modification options and treatment options were discussed and  Grace Pennington agreed to the above obesity treatment plan.  I, Kristopher Delk, am acting as transcriptionist for Ilene Qua, MD  I have reviewed the above documentation for accuracy and completeness, and I agree with the above. - Ilene Qua, MD

## 2018-01-30 MED FILL — CITALOPRAM HBR 10 MG TABLET: 10 | 90 days supply | Qty: 90 | Fill #3

## 2018-02-10 ENCOUNTER — Other Ambulatory Visit (INDEPENDENT_AMBULATORY_CARE_PROVIDER_SITE_OTHER): Payer: Self-pay | Admitting: Family Medicine

## 2018-02-10 DIAGNOSIS — R7303 Prediabetes: Secondary | ICD-10-CM

## 2018-02-11 ENCOUNTER — Other Ambulatory Visit (INDEPENDENT_AMBULATORY_CARE_PROVIDER_SITE_OTHER): Payer: Self-pay

## 2018-02-11 DIAGNOSIS — R7303 Prediabetes: Secondary | ICD-10-CM

## 2018-02-11 MED ORDER — METFORMIN HCL 500 MG PO TABS: 500.0000 mg | ORAL_TABLET | Freq: Two times a day (BID) | ORAL | 0 refills | Status: DC

## 2018-02-11 MED FILL — metFORMIN HCL 500 MG TABS: 500 | 30 days supply | Qty: 60 | Fill #0

## 2018-02-19 ENCOUNTER — Ambulatory Visit (INDEPENDENT_AMBULATORY_CARE_PROVIDER_SITE_OTHER): Payer: 59 | Admitting: Family Medicine

## 2018-02-19 ENCOUNTER — Encounter (INDEPENDENT_AMBULATORY_CARE_PROVIDER_SITE_OTHER): Payer: Self-pay

## 2018-02-26 ENCOUNTER — Ambulatory Visit (INDEPENDENT_AMBULATORY_CARE_PROVIDER_SITE_OTHER): Payer: 59 | Admitting: Physician Assistant

## 2018-02-26 VITALS — BP 115/81 | HR 81 | Temp 97.5°F | Ht 66.0 in | Wt 250.0 lb

## 2018-02-26 DIAGNOSIS — E559 Vitamin D deficiency, unspecified: Secondary | ICD-10-CM

## 2018-02-26 DIAGNOSIS — Z9189 Other specified personal risk factors, not elsewhere classified: Secondary | ICD-10-CM

## 2018-02-26 DIAGNOSIS — R7303 Prediabetes: Secondary | ICD-10-CM

## 2018-02-26 DIAGNOSIS — E7849 Other hyperlipidemia: Secondary | ICD-10-CM

## 2018-02-26 DIAGNOSIS — Z6841 Body Mass Index (BMI) 40.0 and over, adult: Secondary | ICD-10-CM | POA: Diagnosis not present

## 2018-02-26 MED ORDER — VITAMIN D (ERGOCALCIFEROL) 1.25 MG (50000 UNIT) PO CAPS: 50000.0000 [IU] | ORAL_CAPSULE | ORAL | 0 refills | Status: DC

## 2018-02-26 NOTE — Progress Notes (Signed)
Office: [phone]  /  Fax: [phone]   HPI:   Chief Complaint: OBESITY Grace Pennington is here to discuss her progress with her obesity treatment plan. She is on the Category 3 plan + 200 calories and is following her eating plan approximately 90 % of the time. She states she is walking and using resistance bands for 30 minutes 5 times per week. Grace Pennington did well maintaining her weight over the last few weeks. She reports eating extra eggs for breakfast and eating extra snack calories throughout the day.  Her weight is 250 lb (113.4 kg) today and has not lost weight since her last visit. She has lost 5 lbs since starting treatment with Grace Pennington.  Vitamin D Deficiency Grace Pennington has a diagnosis of vitamin D deficiency. She is on prescription Vit D, last level not at goal. She denies nausea, vomiting or muscle weakness.  Pre-Diabetes Grace Pennington has a diagnosis of pre-diabetes based on her elevated Hgb A1c and was informed this puts her at greater risk of developing diabetes. She notes polyphagia improved with addition of 2nd dose of metformin. She denies hypoglycemia, nausea, vomiting, or diarrhea. She continues to work on diet and exercise to decrease risk of diabetes.   At risk for diabetes Grace Pennington is at higher than average risk for developing diabetes due to her obesity and pre-diabetes. She currently denies polyuria or polydipsia.  Hyperlipidemia Grace Pennington has hyperlipidemia and has been trying to improve her cholesterol levels with intensive lifestyle modification including a low saturated fat diet, exercise and weight loss. She denies any chest pain, claudication or myalgias. She is not on medications, last labs not at goal.  ALLERGIES: No Known Allergies  MEDICATIONS: Current Outpatient Medications on File Prior to Visit  Medication Sig Dispense Refill  . citalopram (CELEXA) 10 MG tablet Take 10 mg by mouth daily.  6  . hydrochlorothiazide (HYDRODIURIL) 25 MG tablet Take 25 mg by mouth daily.    .  metFORMIN (GLUCOPHAGE) 500 MG tablet Take 1 tablet (500 mg total) by mouth 2 (two) times daily with a meal. 60 tablet 0  . pantoprazole (PROTONIX) 40 MG tablet Take 40 mg by mouth daily.    . ranitidine (ZANTAC) 150 MG capsule Take 150 mg by mouth 2 (two) times daily.     No current facility-administered medications on file prior to visit.     PAST MEDICAL HISTORY: Past Medical History:  Diagnosis Date  . Hypertension   . Palpitations   . Sleep apnea     PAST SURGICAL HISTORY: Past Surgical History:  Procedure Laterality Date  . TUBAL LIGATION      SOCIAL HISTORY: Social History   Tobacco Use  . Smoking status: Former Smoker    Last attempt to quit: 07/29/1996    Years since quitting: 21.5  . Smokeless tobacco: Never Used  Substance Use Topics  . Alcohol use: No  . Drug use: No    FAMILY HISTORY: Family History  Problem Relation Age of Onset  . Heart disease Mother   . Diabetes Mother   . Hyperlipidemia Mother   . Hypertension Mother   . Kidney failure Mother   . Diabetes Unknown   . Stroke Unknown   . Hypertension Unknown   . Kidney failure Unknown   . Coronary artery disease Unknown   . Sleep apnea Father   . Diabetes Father   . Hyperlipidemia Father   . Hypertension Father     ROS: Review of Systems  Constitutional: Negative for weight loss.  Cardiovascular: Negative for chest pain and claudication.  Gastrointestinal: Negative for diarrhea, nausea and vomiting.  Genitourinary: Negative for frequency.  Musculoskeletal: Negative for myalgias.       Negative muscle weakness  Endo/Heme/Allergies: Negative for polydipsia.       Positive polyphagia Negative hypoglycemia    PHYSICAL EXAM: Blood pressure 115/81, pulse 81, temperature (!) 97.5 F (36.4 C), temperature source Oral, height 5\' 6"  (1.676 m), weight 250 lb (113.4 kg), SpO2 96 %. Body mass index is 40.35 kg/m. Physical Exam  Constitutional: She is oriented to person, place, and time. She  appears well-developed and well-nourished.  Cardiovascular: Normal rate.  Pulmonary/Chest: Effort normal.  Musculoskeletal: Normal range of motion.  Neurological: She is oriented to person, place, and time.  Skin: Skin is warm and dry.  Psychiatric: She has a normal mood and affect. Her behavior is normal.  Vitals reviewed.   RECENT LABS AND TESTS: BMET    Component Value Date/Time   NA 139 10/07/2017 1200   K 3.9 10/07/2017 1200   CL 103 10/07/2017 1200   CO2 23 10/07/2017 1200   GLUCOSE 87 10/07/2017 1200   GLUCOSE 147 (H) 01/03/2017 1624   BUN 16 10/07/2017 1200   CREATININE 0.92 10/07/2017 1200   CALCIUM 9.3 10/07/2017 1200   GFRNONAA 75 10/07/2017 1200   GFRAA 87 10/07/2017 1200   Lab Results  Component Value Date   HGBA1C 6.4 (H) 10/07/2017   Lab Results  Component Value Date   INSULIN 14.3 10/07/2017   CBC    Component Value Date/Time   WBC 5.9 10/07/2017 1200   WBC 7.4 01/03/2017 1624   RBC 4.73 10/07/2017 1200   RBC 4.54 01/03/2017 1624   HGB 12.6 10/07/2017 1200   HCT 39.4 10/07/2017 1200   PLT 319 01/03/2017 1624   MCV 83 10/07/2017 1200   MCH 26.6 10/07/2017 1200   MCH 25.1 (L) 01/03/2017 1624   MCHC 32.0 10/07/2017 1200   MCHC 32.4 01/03/2017 1624   RDW 16.6 (H) 10/07/2017 1200   LYMPHSABS 2.8 10/07/2017 1200   MONOABS 0.9 07/22/2013 1515   EOSABS 0.2 10/07/2017 1200   BASOSABS 0.0 10/07/2017 1200   Iron/TIBC/Ferritin/ %Sat No results found for: IRON, TIBC, FERRITIN, IRONPCTSAT Lipid Panel     Component Value Date/Time   CHOL 184 10/07/2017 1200   TRIG 58 10/07/2017 1200   HDL 52 10/07/2017 1200   LDLCALC 120 (H) 10/07/2017 1200   Hepatic Function Panel     Component Value Date/Time   PROT 7.4 10/07/2017 1200   ALBUMIN 4.0 10/07/2017 1200   AST 19 10/07/2017 1200   ALT 22 10/07/2017 1200   ALKPHOS 73 10/07/2017 1200   BILITOT 0.2 10/07/2017 1200      Component Value Date/Time   TSH 1.730 10/07/2017 1200  Results for Grace, Pennington "Grace Pennington" (MRN [id number]) as of 02/26/2018 17:41  Ref. Range 10/07/2017 12:00  Vitamin D, 25-Hydroxy Latest Ref Range: 30.0 - 100.0 ng/mL 16.7 (L)    ASSESSMENT AND PLAN: Vitamin D deficiency - Plan: VITAMIN D 25 Hydroxy (Vit-D Deficiency, Fractures), Vitamin D, Ergocalciferol, (DRISDOL) 50000 units CAPS capsule  Prediabetes - Plan: Comprehensive metabolic panel, Hemoglobin A1c, Insulin, random  Other hyperlipidemia - Plan: Lipid Panel With LDL/HDL Ratio  At risk for diabetes mellitus  Class 3 severe obesity with serious comorbidity and body mass index (BMI) of 40.0 to 44.9 in adult, unspecified obesity type (HCC)  PLAN:  Vitamin D Deficiency Jolanta was informed that low  vitamin D levels contributes to fatigue and are associated with obesity, breast, and colon cancer. Grace Pennington agrees to continue taking prescription Vit D @50 ,000 IU every week #4 and we will refill for 1 month. She will follow up for routine testing of vitamin D, at least 2-3 times per year. She was informed of the risk of over-replacement of vitamin D and agrees to not increase her dose unless she discusses this with Grace Pennington first. We will check labs today and Grace Pennington agrees to follow up with our clinic in 3 weeks.  Pre-Diabetes Grace Pennington will continue to work on weight loss, diet, exercise, and decreasing simple carbohydrates in her diet to help decrease the risk of diabetes. We dicussed metformin including benefits and risks. She was informed that eating too many simple carbohydrates or too many calories at one sitting increases the likelihood of GI side effects. Taylormarie agrees to continue taking metformin, and we will check labs today. Grace Pennington agrees to follow up with our clinic in 3 weeks as directed to monitor her progress.  Diabetes risk counselling Grace Pennington was given extended (15 minutes) diabetes prevention counseling today. She is 48 y.o. female and has risk factors for diabetes including obesity and pre-diabetes. We discussed  intensive lifestyle modifications today with an emphasis on weight loss as well as increasing exercise and decreasing simple carbohydrates in her diet.  Hyperlipidemia Grace Pennington was informed of the American Heart Association Guidelines emphasizing intensive lifestyle modifications as the first line treatment for hyperlipidemia. We discussed many lifestyle modifications today in depth, and Grace Pennington will continue to work on decreasing saturated fats such as fatty red meat, butter and many fried foods. She will also increase vegetables and lean protein in her diet and continue to work on diet, exercise, and weight loss efforts. We will check labs today and Grace Pennington agrees to follow up with our clinic in 3 weeks.  Obesity Grace Pennington is currently in the action stage of change. As such, her goal is to continue with weight loss efforts She has agreed to follow the Category 3 plan Grace Pennington has been instructed to work up to a goal of 150 minutes of combined cardio and strengthening exercise per week for weight loss and overall health benefits. We discussed the following Behavioral Modification Strategies today: work on meal planning and easy cooking plans and planning for success   Grace Pennington has agreed to follow up with our clinic in 3 weeks. She was informed of the importance of frequent follow up visits to maximize her success with intensive lifestyle modifications for her multiple health conditions.   OBESITY BEHAVIORAL INTERVENTION VISIT  Today's visit was # 9 out of 22.  Starting weight: 255 lbs Starting date: 10/07/17 Today's weight : 250 lbs Today's date: 02/26/2018 Total lbs lost to date: 5    ASK: We discussed the diagnosis of obesity with Grace Pennington today and Grace Pennington agreed to give Grace Pennington permission to discuss obesity behavioral modification therapy today.  ASSESS: Grace Pennington has the diagnosis of obesity and her BMI today is 40.37 Grace Pennington is in the action stage of change   ADVISE: Grace Pennington was educated on the  multiple health risks of obesity as well as the benefit of weight loss to improve her health. She was advised of the need for long term treatment and the importance of lifestyle modifications.  AGREE: Multiple dietary modification options and treatment options were discussed and  Grace Pennington agreed to the above obesity treatment plan.  Grace Pennington, am acting as transcriptionist for [employer],  PA-C I, Grace Potash, PA-C have reviewed above note and agree with its content

## 2018-02-27 LAB — HEMOGLOBIN A1C
Est. average glucose Bld gHb Est-mCnc: 123 mg/dL
Hgb A1c MFr Bld: 5.9 % — ABNORMAL HIGH (ref 4.8–5.6)

## 2018-02-27 LAB — COMPREHENSIVE METABOLIC PANEL
ALT: 22 IU/L (ref 0–32)
AST: 17 IU/L (ref 0–40)
Albumin/Globulin Ratio: 1.4 (ref 1.2–2.2)
Albumin: 4.2 g/dL (ref 3.5–5.5)
Alkaline Phosphatase: 66 IU/L (ref 39–117)
BUN/Creatinine Ratio: 18 (ref 9–23)
BUN: 17 mg/dL (ref 6–24)
Bilirubin Total: 0.3 mg/dL (ref 0.0–1.2)
CO2: 24 mmol/L (ref 20–29)
Calcium: 9.6 mg/dL (ref 8.7–10.2)
Chloride: 101 mmol/L (ref 96–106)
Creatinine, Ser: 0.97 mg/dL (ref 0.57–1.00)
GFR calc Af Amer: 81 mL/min/{1.73_m2} (ref >59)
GFR calc non Af Amer: 70 mL/min/{1.73_m2} (ref >59)
Globulin, Total: 3.1 g/dL (ref 1.5–4.5)
Glucose: 83 mg/dL (ref 65–99)
Potassium: 4 mmol/L (ref 3.5–5.2)
Sodium: 142 mmol/L (ref 134–144)
Total Protein: 7.3 g/dL (ref 6.0–8.5)

## 2018-02-27 LAB — LIPID PANEL WITH LDL/HDL RATIO
Cholesterol, Total: 190 mg/dL (ref 100–199)
HDL: 54 mg/dL (ref >39)
LDL Calculated: 121 mg/dL — ABNORMAL HIGH (ref 0–99)
LDl/HDL Ratio: 2.2 ratio (ref 0.0–3.2)
Triglycerides: 73 mg/dL (ref 0–149)
VLDL Cholesterol Cal: 15 mg/dL (ref 5–40)

## 2018-02-27 LAB — INSULIN, RANDOM: INSULIN: 16.9 u[IU]/mL (ref 2.6–24.9)

## 2018-02-27 LAB — VITAMIN D 25 HYDROXY (VIT D DEFICIENCY, FRACTURES): Vit D, 25-Hydroxy: 37.1 ng/mL (ref 30.0–100.0)

## 2018-03-09 MED FILL — VIT D2 1.25 MG (50,000 UNIT: 1.25 MG | 28 days supply | Qty: 4 | Fill #0

## 2018-03-19 ENCOUNTER — Ambulatory Visit (INDEPENDENT_AMBULATORY_CARE_PROVIDER_SITE_OTHER): Payer: 59 | Admitting: Physician Assistant

## 2018-03-19 VITALS — BP 115/81 | HR 67 | Temp 98.2°F | Ht 66.0 in | Wt 249.0 lb

## 2018-03-19 DIAGNOSIS — R7303 Prediabetes: Secondary | ICD-10-CM | POA: Diagnosis not present

## 2018-03-19 DIAGNOSIS — Z9189 Other specified personal risk factors, not elsewhere classified: Secondary | ICD-10-CM | POA: Diagnosis not present

## 2018-03-19 DIAGNOSIS — E559 Vitamin D deficiency, unspecified: Secondary | ICD-10-CM | POA: Diagnosis not present

## 2018-03-19 DIAGNOSIS — Z6841 Body Mass Index (BMI) 40.0 and over, adult: Secondary | ICD-10-CM

## 2018-03-19 MED ORDER — VITAMIN D (ERGOCALCIFEROL) 1.25 MG (50000 UNIT) PO CAPS: 50000.0000 [IU] | ORAL_CAPSULE | ORAL | 0 refills | Status: DC

## 2018-03-19 NOTE — Progress Notes (Signed)
Office: [phone]  /  Fax: [phone]   HPI:   Chief Complaint: OBESITY Jovee is here to discuss her progress with her obesity treatment plan. She is on the Category 3 plan and is following her eating plan approximately 80 % of the time. She states she is walking and doing resistance bands 30 minutes 5 times per week. Merideth did well with weight loss. She reports going over her snack calories, due to using a new creamer in her coffee. Dorette enjoys the category 3 plan and she wants to continue. Her weight is 249 lb (112.9 kg) today and has had a weight loss of 1 pound over a period of 3 weeks since her last visit. She has lost 6 lbs since starting treatment with [place].  Vitamin D deficiency Helma has a diagnosis of vitamin D deficiency. She is currently taking prescription vit D and denies nausea, vomiting or muscle weakness.  Pre-Diabetes Gracilyn has a diagnosis of prediabetes based on her elevated Hgb A1c and was informed this puts her at greater risk of developing diabetes. Her Hgb A1c improved at the last visit to 5.9. She is taking metformin currently and continues to work on diet and exercise to decrease risk of diabetes. She denies polyphagia or hypoglycemia.  At risk for diabetes Jezebel is at higher than average risk for developing diabetes due to her obesity and prediabetes. She currently denies polyuria or polydipsia.  ALLERGIES: No Known Allergies  MEDICATIONS: Current Outpatient Medications on File Prior to Visit  Medication Sig Dispense Refill  . citalopram (CELEXA) 10 MG tablet Take 10 mg by mouth daily.  6  . hydrochlorothiazide (HYDRODIURIL) 25 MG tablet Take 25 mg by mouth daily.    . metFORMIN (GLUCOPHAGE) 500 MG tablet Take 1 tablet (500 mg total) by mouth 2 (two) times daily with a meal. 60 tablet 0  . pantoprazole (PROTONIX) 40 MG tablet Take 40 mg by mouth daily.    . ranitidine (ZANTAC) 150 MG capsule Take 150 mg by mouth 2 (two) times daily.     No current  facility-administered medications on file prior to visit.     PAST MEDICAL HISTORY: Past Medical History:  Diagnosis Date  . Hypertension   . Palpitations   . Sleep apnea     PAST SURGICAL HISTORY: Past Surgical History:  Procedure Laterality Date  . TUBAL LIGATION      SOCIAL HISTORY: Social History   Tobacco Use  . Smoking status: Former Smoker    Last attempt to quit: 07/29/1996    Years since quitting: 21.6  . Smokeless tobacco: Never Used  Substance Use Topics  . Alcohol use: No  . Drug use: No    FAMILY HISTORY: Family History  Problem Relation Age of Onset  . Heart disease Mother   . Diabetes Mother   . Hyperlipidemia Mother   . Hypertension Mother   . Kidney failure Mother   . Diabetes Unknown   . Stroke Unknown   . Hypertension Unknown   . Kidney failure Unknown   . Coronary artery disease Unknown   . Sleep apnea Father   . Diabetes Father   . Hyperlipidemia Father   . Hypertension Father     ROS: Review of Systems  Constitutional: Positive for weight loss.  Gastrointestinal: Negative for nausea and vomiting.  Genitourinary: Negative for frequency.  Musculoskeletal:       Negative for muscle weakness  Endo/Heme/Allergies: Negative for polydipsia.       Negative for  polyphagia Negative for hypoglycemia    PHYSICAL EXAM: Blood pressure 115/81, pulse 67, temperature 98.2 F (36.8 C), temperature source Oral, height 5\' 6"  (1.676 m), weight 249 lb (112.9 kg), last menstrual period 01/13/2018, SpO2 96 %. Body mass index is 40.19 kg/m. Physical Exam  Constitutional: She is oriented to person, place, and time. She appears well-developed and well-nourished.  Cardiovascular: Normal rate.  Pulmonary/Chest: Effort normal.  Musculoskeletal: Normal range of motion.  Neurological: She is oriented to person, place, and time.  Skin: Skin is warm and dry.  Psychiatric: She has a normal mood and affect. Her behavior is normal.  Vitals  reviewed.   RECENT LABS AND TESTS: BMET    Component Value Date/Time   NA 142 02/26/2018 0849   K 4.0 02/26/2018 0849   CL 101 02/26/2018 0849   CO2 24 02/26/2018 0849   GLUCOSE 83 02/26/2018 0849   GLUCOSE 147 (H) 01/03/2017 1624   BUN 17 02/26/2018 0849   CREATININE 0.97 02/26/2018 0849   CALCIUM 9.6 02/26/2018 0849   GFRNONAA 70 02/26/2018 0849   GFRAA 81 02/26/2018 0849   Lab Results  Component Value Date   HGBA1C 5.9 (H) 02/26/2018   HGBA1C 6.4 (H) 10/07/2017   Lab Results  Component Value Date   INSULIN 16.9 02/26/2018   INSULIN 14.3 10/07/2017   CBC    Component Value Date/Time   WBC 5.9 10/07/2017 1200   WBC 7.4 01/03/2017 1624   RBC 4.73 10/07/2017 1200   RBC 4.54 01/03/2017 1624   HGB 12.6 10/07/2017 1200   HCT 39.4 10/07/2017 1200   PLT 319 01/03/2017 1624   MCV 83 10/07/2017 1200   MCH 26.6 10/07/2017 1200   MCH 25.1 (L) 01/03/2017 1624   MCHC 32.0 10/07/2017 1200   MCHC 32.4 01/03/2017 1624   RDW 16.6 (H) 10/07/2017 1200   LYMPHSABS 2.8 10/07/2017 1200   MONOABS 0.9 07/22/2013 1515   EOSABS 0.2 10/07/2017 1200   BASOSABS 0.0 10/07/2017 1200   Iron/TIBC/Ferritin/ %Sat No results found for: IRON, TIBC, FERRITIN, IRONPCTSAT Lipid Panel     Component Value Date/Time   CHOL 190 02/26/2018 0849   TRIG 73 02/26/2018 0849   HDL 54 02/26/2018 0849   LDLCALC 121 (H) 02/26/2018 0849   Hepatic Function Panel     Component Value Date/Time   PROT 7.3 02/26/2018 0849   ALBUMIN 4.2 02/26/2018 0849   AST 17 02/26/2018 0849   ALT 22 02/26/2018 0849   ALKPHOS 66 02/26/2018 0849   BILITOT 0.3 02/26/2018 0849      Component Value Date/Time   TSH 1.730 10/07/2017 1200   Results for LATRECIA, CAPITO "MISTINA COATNEY" (MRN [id number]) as of 03/19/2018 13:44  Ref. Range 02/26/2018 08:49  Vitamin D, 25-Hydroxy Latest Ref Range: 30.0 - 100.0 ng/mL 37.1   ASSESSMENT AND PLAN: Vitamin D deficiency - Plan: Vitamin D, Ergocalciferol, (DRISDOL) 50000 units CAPS  capsule  Prediabetes  At risk for diabetes mellitus  Class 3 severe obesity with serious comorbidity and body mass index (BMI) of 40.0 to 44.9 in adult, unspecified obesity type ([place])  PLAN:  Vitamin D Deficiency Laylaa was informed that low vitamin D levels contributes to fatigue and are associated with obesity, breast, and colon cancer. She agrees to continue to take prescription Vit D @50 ,000 IU every week #4 with no refills and will follow up for routine testing of vitamin D, at least 2-3 times per year. She was informed of the risk of over-replacement of vitamin  D and agrees to not increase her dose unless she discusses this with [place] first. Ivee agrees to follow up as directed.  Pre-Diabetes Muslima will continue to work on weight loss, exercise, and decreasing simple carbohydrates in her diet to help decrease the risk of diabetes. We dicussed metformin including benefits and risks. She was informed that eating too many simple carbohydrates or too many calories at one sitting increases the likelihood of GI side effects. Jakyla will continue metformin for now and a prescription was not written today. Ivannia agreed to follow up with [place] as directed to monitor her progress.  Diabetes risk counseling Starsha was given extended (15 minutes) diabetes prevention counseling today. She is 47 y.o. female and has risk factors for diabetes including obesity and prediabetes. We discussed intensive lifestyle modifications today with an emphasis on weight loss as well as increasing exercise and decreasing simple carbohydrates in her diet.  Obesity Azora is currently in the action stage of change. As such, her goal is to continue with weight loss efforts She has agreed to follow the Category 3 plan Winnifred has been instructed to work up to a goal of 150 minutes of combined cardio and strengthening exercise per week for weight loss and overall health benefits. We discussed the following Behavioral Modification  Strategies today: planning for success and work on meal planning and easy cooking plans  Andromeda has agreed to follow up with our clinic in 2 to 3 weeks. She was informed of the importance of frequent follow up visits to maximize her success with intensive lifestyle modifications for her multiple health conditions.   OBESITY BEHAVIORAL INTERVENTION VISIT  Today's visit was # 10   Starting weight: 255 lbs Starting date: 10/07/17 Today's weight : 249 lbs Today's date: 03/19/2018 Total lbs lost to date: 6 At least 15 minutes were spent on discussing the following behavioral intervention visit.   ASK: We discussed the diagnosis of obesity with Reino Kent today and Mikayah agreed to give [place] permission to discuss obesity behavioral modification therapy today.  ASSESS: Arli has the diagnosis of obesity and her BMI today is 40.21 Shela is in the action stage of change   ADVISE: Meriem was educated on the multiple health risks of obesity as well as the benefit of weight loss to improve her health. She was advised of the need for long term treatment and the importance of lifestyle modifications to improve her current health and to decrease her risk of future health problems.  AGREE: Multiple dietary modification options and treatment options were discussed and  Madellyn agreed to follow the recommendations documented in the above note.  ARRANGE: Nayra was educated on the importance of frequent visits to treat obesity as outlined per CMS and USPSTF guidelines and agreed to schedule her next follow up appointment today.  Corey Skains, am acting as transcriptionist for Abby Potash, PA-C I, Abby Potash, PA-C have reviewed above note and agree with its content

## 2018-03-23 DIAGNOSIS — I10 Essential (primary) hypertension: Secondary | ICD-10-CM | POA: Diagnosis not present

## 2018-03-23 DIAGNOSIS — Z6841 Body Mass Index (BMI) 40.0 and over, adult: Secondary | ICD-10-CM | POA: Diagnosis not present

## 2018-03-23 DIAGNOSIS — Z0000 Encounter for general adult medical examination without abnormal findings: Secondary | ICD-10-CM | POA: Diagnosis not present

## 2018-03-23 DIAGNOSIS — F410 Panic disorder [episodic paroxysmal anxiety] without agoraphobia: Secondary | ICD-10-CM | POA: Diagnosis not present

## 2018-03-23 DIAGNOSIS — R7303 Prediabetes: Secondary | ICD-10-CM | POA: Diagnosis not present

## 2018-03-23 DIAGNOSIS — K219 Gastro-esophageal reflux disease without esophagitis: Secondary | ICD-10-CM | POA: Diagnosis not present

## 2018-03-23 MED FILL — PANTOPRAZOLE SOD DR 40 MG T: 40 | 90 days supply | Qty: 90 | Fill #3

## 2018-03-23 MED FILL — HYDROCHLOROTHIAZIDE 25 MG T: 25 | 90 days supply | Qty: 90 | Fill #1

## 2018-03-23 MED FILL — VIT D2 1.25 MG (50,000 UNIT: 1.25 MG | 28 days supply | Qty: 4 | Fill #0

## 2018-04-07 ENCOUNTER — Ambulatory Visit (INDEPENDENT_AMBULATORY_CARE_PROVIDER_SITE_OTHER): Payer: 59 | Admitting: Physician Assistant

## 2018-04-07 VITALS — BP 115/78 | HR 88 | Temp 98.2°F | Ht 66.0 in | Wt 246.0 lb

## 2018-04-07 DIAGNOSIS — Z9189 Other specified personal risk factors, not elsewhere classified: Secondary | ICD-10-CM | POA: Diagnosis not present

## 2018-04-07 DIAGNOSIS — Z6839 Body mass index (BMI) 39.0-39.9, adult: Secondary | ICD-10-CM

## 2018-04-07 DIAGNOSIS — R7303 Prediabetes: Secondary | ICD-10-CM | POA: Diagnosis not present

## 2018-04-07 DIAGNOSIS — E559 Vitamin D deficiency, unspecified: Secondary | ICD-10-CM

## 2018-04-07 MED ORDER — VITAMIN D (ERGOCALCIFEROL) 1.25 MG (50000 UNIT) PO CAPS: 50000.0000 [IU] | ORAL_CAPSULE | ORAL | 0 refills | Status: DC

## 2018-04-07 MED FILL — VIT D2 1.25 MG (50,000 UNIT: 1.25 MG | 28 days supply | Qty: 4 | Fill #0

## 2018-04-07 NOTE — Progress Notes (Signed)
Office: [phone]  /  Fax: [phone]   HPI:   Chief Complaint: OBESITY Grace Pennington is here to discuss her progress with her obesity treatment plan. She is on the Category 3 plan and is following her eating plan approximately 90 % of the time. She states she is walking and using resistance bands for 30 minutes 5 times per week. Grace Pennington did very well with weight loss. She reports getting all of her protein in daily. She followed the plan closely even on vacation at the beach.  Her weight is 246 lb (111.6 kg) today and has had a weight loss of 3 pounds over a period of 2 to 3 weeks since her last visit. She has lost 9 lbs since starting treatment with [place].  Vitamin D Deficiency Grace Pennington has a diagnosis of vitamin D deficiency. She is on prescription Vit D and denies nausea, vomiting or muscle weakness.  Pre-Diabetes Grace Pennington has a diagnosis of pre-diabetes based on her elevated Hgb A1c and was informed this puts her at greater risk of developing diabetes. She is on metformin and continues to work on diet and exercise to decrease risk of diabetes. She denies polyphagia or hypoglycemia.  At risk for diabetes Grace Pennington is at higher than average risk for developing diabetes due to her obesity and pre-diabetes. She currently denies polyuria or polydipsia.  ALLERGIES: No Known Allergies  MEDICATIONS: Current Outpatient Medications on File Prior to Visit  Medication Sig Dispense Refill  . citalopram (CELEXA) 10 MG tablet Take 10 mg by mouth daily.  6  . hydrochlorothiazide (HYDRODIURIL) 25 MG tablet Take 25 mg by mouth daily.    . metFORMIN (GLUCOPHAGE) 500 MG tablet Take 1 tablet (500 mg total) by mouth 2 (two) times daily with a meal. 60 tablet 0  . pantoprazole (PROTONIX) 40 MG tablet Take 40 mg by mouth daily.    . ranitidine (ZANTAC) 150 MG capsule Take 150 mg by mouth 2 (two) times daily.     No current facility-administered medications on file prior to visit.     PAST MEDICAL HISTORY: Past  Medical History:  Diagnosis Date  . Hypertension   . Palpitations   . Sleep apnea     PAST SURGICAL HISTORY: Past Surgical History:  Procedure Laterality Date  . TUBAL LIGATION      SOCIAL HISTORY: Social History   Tobacco Use  . Smoking status: Former Smoker    Last attempt to quit: 07/29/1996    Years since quitting: 21.7  . Smokeless tobacco: Never Used  Substance Use Topics  . Alcohol use: No  . Drug use: No    FAMILY HISTORY: Family History  Problem Relation Age of Onset  . Heart disease Mother   . Diabetes Mother   . Hyperlipidemia Mother   . Hypertension Mother   . Kidney failure Mother   . Diabetes Unknown   . Stroke Unknown   . Hypertension Unknown   . Kidney failure Unknown   . Coronary artery disease Unknown   . Sleep apnea Father   . Diabetes Father   . Hyperlipidemia Father   . Hypertension Father     ROS: Review of Systems  Constitutional: Positive for weight loss.  Gastrointestinal: Negative for nausea and vomiting.  Genitourinary: Negative for frequency.  Musculoskeletal:       Negative muscle weakness  Endo/Heme/Allergies: Negative for polydipsia.       Negative polyphagia Negative hypoglycemia    PHYSICAL EXAM: Blood pressure 115/78, pulse 88, temperature 98.2 F (  36.8 C), temperature source Oral, height 5\' 6"  (1.676 m), weight 246 lb (111.6 kg), last menstrual period 01/10/2018, SpO2 96 %. Body mass index is 39.71 kg/m. Physical Exam  Constitutional: She is oriented to person, place, and time. She appears well-developed and well-nourished.  Cardiovascular: Normal rate.  Pulmonary/Chest: Effort normal.  Musculoskeletal: Normal range of motion.  Neurological: She is oriented to person, place, and time.  Skin: Skin is warm and dry.  Psychiatric: She has a normal mood and affect. Her behavior is normal.  Vitals reviewed.   RECENT LABS AND TESTS: BMET    Component Value Date/Time   NA 142 02/26/2018 0849   K 4.0 02/26/2018 0849    CL 101 02/26/2018 0849   CO2 24 02/26/2018 0849   GLUCOSE 83 02/26/2018 0849   GLUCOSE 147 (H) 01/03/2017 1624   BUN 17 02/26/2018 0849   CREATININE 0.97 02/26/2018 0849   CALCIUM 9.6 02/26/2018 0849   GFRNONAA 70 02/26/2018 0849   GFRAA 81 02/26/2018 0849   Lab Results  Component Value Date   HGBA1C 5.9 (H) 02/26/2018   HGBA1C 6.4 (H) 10/07/2017   Lab Results  Component Value Date   INSULIN 16.9 02/26/2018   INSULIN 14.3 10/07/2017   CBC    Component Value Date/Time   WBC 5.9 10/07/2017 1200   WBC 7.4 01/03/2017 1624   RBC 4.73 10/07/2017 1200   RBC 4.54 01/03/2017 1624   HGB 12.6 10/07/2017 1200   HCT 39.4 10/07/2017 1200   PLT 319 01/03/2017 1624   MCV 83 10/07/2017 1200   MCH 26.6 10/07/2017 1200   MCH 25.1 (L) 01/03/2017 1624   MCHC 32.0 10/07/2017 1200   MCHC 32.4 01/03/2017 1624   RDW 16.6 (H) 10/07/2017 1200   LYMPHSABS 2.8 10/07/2017 1200   MONOABS 0.9 07/22/2013 1515   EOSABS 0.2 10/07/2017 1200   BASOSABS 0.0 10/07/2017 1200   Iron/TIBC/Ferritin/ %Sat No results found for: IRON, TIBC, FERRITIN, IRONPCTSAT Lipid Panel     Component Value Date/Time   CHOL 190 02/26/2018 0849   TRIG 73 02/26/2018 0849   HDL 54 02/26/2018 0849   LDLCALC 121 (H) 02/26/2018 0849   Hepatic Function Panel     Component Value Date/Time   PROT 7.3 02/26/2018 0849   ALBUMIN 4.2 02/26/2018 0849   AST 17 02/26/2018 0849   ALT 22 02/26/2018 0849   ALKPHOS 66 02/26/2018 0849   BILITOT 0.3 02/26/2018 0849      Component Value Date/Time   TSH 1.730 10/07/2017 1200  Results for KAETLIN, BULLEN "DONNAJEAN CHESNUT" (MRN [id number]) as of 04/07/2018 13:58  Ref. Range 02/26/2018 08:49  Vitamin D, 25-Hydroxy Latest Ref Range: 30.0 - 100.0 ng/mL 37.1    ASSESSMENT AND PLAN: Vitamin D deficiency - Plan: Vitamin D, Ergocalciferol, (DRISDOL) 50000 units CAPS capsule  Prediabetes  At risk for diabetes mellitus  Class 2 severe obesity with serious comorbidity and body mass index  (BMI) of 39.0 to 39.9 in adult, unspecified obesity type ([place])  PLAN:  Vitamin D Deficiency Grace Pennington was informed that low vitamin D levels contributes to fatigue and are associated with obesity, breast, and colon cancer. Grace Pennington agrees to continue taking prescription Vit D @50 ,000 IU every week #4 and we will refill for 1 month. She will follow up for routine testing of vitamin D, at least 2-3 times per year. She was informed of the risk of over-replacement of vitamin D and agrees to not increase her dose unless she discusses this with [place]  first. Grace Pennington agrees to follow up with our clinic in 3 weeks.  Pre-Diabetes Grace Pennington will continue to work on weight loss, diet, exercise, and decreasing simple carbohydrates in her diet to help decrease the risk of diabetes. We dicussed metformin including benefits and risks. She was informed that eating too many simple carbohydrates or too many calories at one sitting increases the likelihood of GI side effects. Grace Pennington agrees to continue taking metformin and she agrees to follow up with our clinic in 3 weeks as directed to monitor her progress.  Diabetes risk counselling Grace Pennington was given extended (15 minutes) diabetes prevention counseling today. She is 47 y.o. female and has risk factors for diabetes including obesity and pre-diabetes. We discussed intensive lifestyle modifications today with an emphasis on weight loss as well as increasing exercise and decreasing simple carbohydrates in her diet.  Obesity Grace Pennington is currently in the action stage of change. As such, her goal is to continue with weight loss efforts She has agreed to follow the Category 3 plan Grace Pennington has been instructed to work up to a goal of 150 minutes of combined cardio and strengthening exercise per week for weight loss and overall health benefits. We discussed the following Behavioral Modification Strategies today: work on meal planning and easy cooking plans and planning for success   Grace Pennington has  agreed to follow up with our clinic in 3 weeks. She was informed of the importance of frequent follow up visits to maximize her success with intensive lifestyle modifications for her multiple health conditions.   OBESITY BEHAVIORAL INTERVENTION VISIT  Today's visit was # 11  Starting weight: 255 lbs Starting date: 10/07/17 Today's weight : 246 lbs  Today's date: 04/07/2018 Total lbs lost to date: 9    ASK: We discussed the diagnosis of obesity with Grace Pennington today and Grace Pennington agreed to give [place] permission to discuss obesity behavioral modification therapy today.  ASSESS: Kimmarie has the diagnosis of obesity and her BMI today is 39.72 Cyleigh is in the action stage of change   ADVISE: Raphaella was educated on the multiple health risks of obesity as well as the benefit of weight loss to improve her health. She was advised of the need for long term treatment and the importance of lifestyle modifications.  AGREE: Multiple dietary modification options and treatment options were discussed and  Mandee agreed to the above obesity treatment plan.  Wilhemena Durie, am acting as transcriptionist for Abby Potash, PA-C I, Abby Potash, PA-C have reviewed above note and agree with its content

## 2018-04-14 DIAGNOSIS — Z1151 Encounter for screening for human papillomavirus (HPV): Secondary | ICD-10-CM | POA: Diagnosis not present

## 2018-04-14 DIAGNOSIS — Z01419 Encounter for gynecological examination (general) (routine) without abnormal findings: Secondary | ICD-10-CM | POA: Diagnosis not present

## 2018-04-14 DIAGNOSIS — Z6841 Body Mass Index (BMI) 40.0 and over, adult: Secondary | ICD-10-CM | POA: Diagnosis not present

## 2018-04-14 DIAGNOSIS — N951 Menopausal and female climacteric states: Secondary | ICD-10-CM | POA: Diagnosis not present

## 2018-04-14 DIAGNOSIS — Z1231 Encounter for screening mammogram for malignant neoplasm of breast: Secondary | ICD-10-CM | POA: Diagnosis not present

## 2018-04-21 ENCOUNTER — Ambulatory Visit (INDEPENDENT_AMBULATORY_CARE_PROVIDER_SITE_OTHER): Payer: 59 | Admitting: Physician Assistant

## 2018-04-21 VITALS — BP 123/85 | HR 83 | Temp 98.9°F | Ht 66.0 in | Wt 249.0 lb

## 2018-04-21 DIAGNOSIS — Z6841 Body Mass Index (BMI) 40.0 and over, adult: Secondary | ICD-10-CM | POA: Diagnosis not present

## 2018-04-21 DIAGNOSIS — R7303 Prediabetes: Secondary | ICD-10-CM | POA: Diagnosis not present

## 2018-04-21 DIAGNOSIS — Z9189 Other specified personal risk factors, not elsewhere classified: Secondary | ICD-10-CM | POA: Diagnosis not present

## 2018-04-21 DIAGNOSIS — E559 Vitamin D deficiency, unspecified: Secondary | ICD-10-CM | POA: Diagnosis not present

## 2018-04-21 MED ORDER — VITAMIN D (ERGOCALCIFEROL) 1.25 MG (50000 UNIT) PO CAPS: 50000.0000 [IU] | ORAL_CAPSULE | ORAL | 0 refills | Status: DC

## 2018-04-21 MED FILL — VIT D2 1.25 MG (50,000 UNIT: 1.25 MG | 28 days supply | Qty: 4 | Fill #0

## 2018-04-22 NOTE — Progress Notes (Signed)
Office: [phone]  /  Fax: [phone]   HPI:   Chief Complaint: OBESITY Grace Pennington is here to discuss her progress with her obesity treatment plan. She is on the Category 3 plan and is following her eating plan approximately 90 % of the time. She states she is walking 30 minutes 5 times per week and using resistance bands 10 minutes 5 times per week. Djeneba reports that she is weighing her meat before cooking and is overeating her snack calories at times by adding rice to dinner. She is ready to get back on track.  Her weight is 249 lb (112.9 kg) today and has not lost weight since her last visit. She has lost 6 lbs since starting treatment with [place].  Vitamin D deficiency Grace Pennington has a diagnosis of vitamin D deficiency. She is currently taking vit D and denies nausea, vomiting or muscle weakness.  At risk for osteopenia and osteoporosis Grace Pennington is at higher risk of osteopenia and osteoporosis due to vitamin D deficiency.   Pre-Diabetes Grace Pennington has a diagnosis of pre-diabetes based on her elevated Hgb A1c and was informed this puts her at greater risk of developing diabetes. She is taking metformin currently and continues to work on diet and exercise to decrease risk of diabetes. She denies polyphagia.  ALLERGIES: No Known Allergies  MEDICATIONS: Current Outpatient Medications on File Prior to Visit  Medication Sig Dispense Refill  . citalopram (CELEXA) 10 MG tablet Take 10 mg by mouth daily.  6  . hydrochlorothiazide (HYDRODIURIL) 25 MG tablet Take 25 mg by mouth daily.    . metFORMIN (GLUCOPHAGE) 500 MG tablet Take 1 tablet (500 mg total) by mouth 2 (two) times daily with a meal. 60 tablet 0  . pantoprazole (PROTONIX) 40 MG tablet Take 40 mg by mouth daily.    . ranitidine (ZANTAC) 150 MG capsule Take 150 mg by mouth 2 (two) times daily.     No current facility-administered medications on file prior to visit.     PAST MEDICAL HISTORY: Past Medical History:  Diagnosis Date  .  Hypertension   . Palpitations   . Sleep apnea     PAST SURGICAL HISTORY: Past Surgical History:  Procedure Laterality Date  . TUBAL LIGATION      SOCIAL HISTORY: Social History   Tobacco Use  . Smoking status: Former Smoker    Last attempt to quit: 07/29/1996    Years since quitting: 21.7  . Smokeless tobacco: Never Used  Substance Use Topics  . Alcohol use: No  . Drug use: No    FAMILY HISTORY: Family History  Problem Relation Age of Onset  . Heart disease Mother   . Diabetes Mother   . Hyperlipidemia Mother   . Hypertension Mother   . Kidney failure Mother   . Diabetes Unknown   . Stroke Unknown   . Hypertension Unknown   . Kidney failure Unknown   . Coronary artery disease Unknown   . Sleep apnea Father   . Diabetes Father   . Hyperlipidemia Father   . Hypertension Father     ROS: Review of Systems  Constitutional: Negative for weight loss.  Gastrointestinal: Negative for nausea and vomiting.  Musculoskeletal:       Negative for muscle weakness.  Endo/Heme/Allergies:       Negative for polyphagia.    PHYSICAL EXAM: Blood pressure 123/85, pulse 83, temperature 98.9 F (37.2 C), temperature source Oral, height 5\' 6"  (1.676 m), weight 249 lb (112.9 kg), SpO2 98 %.  Body mass index is 40.19 kg/m. Physical Exam  Constitutional: She is oriented to person, place, and time. She appears well-developed and well-nourished.  Cardiovascular: Normal rate.  Pulmonary/Chest: Effort normal.  Musculoskeletal: Normal range of motion.  Neurological: She is oriented to person, place, and time.  Skin: Skin is warm and dry.  Psychiatric: She has a normal mood and affect. Her behavior is normal.  Vitals reviewed.   RECENT LABS AND TESTS: BMET    Component Value Date/Time   NA 142 02/26/2018 0849   K 4.0 02/26/2018 0849   CL 101 02/26/2018 0849   CO2 24 02/26/2018 0849   GLUCOSE 83 02/26/2018 0849   GLUCOSE 147 (H) 01/03/2017 1624   BUN 17 02/26/2018 0849    CREATININE 0.97 02/26/2018 0849   CALCIUM 9.6 02/26/2018 0849   GFRNONAA 70 02/26/2018 0849   GFRAA 81 02/26/2018 0849   Lab Results  Component Value Date   HGBA1C 5.9 (H) 02/26/2018   HGBA1C 6.4 (H) 10/07/2017   Lab Results  Component Value Date   INSULIN 16.9 02/26/2018   INSULIN 14.3 10/07/2017   CBC    Component Value Date/Time   WBC 5.9 10/07/2017 1200   WBC 7.4 01/03/2017 1624   RBC 4.73 10/07/2017 1200   RBC 4.54 01/03/2017 1624   HGB 12.6 10/07/2017 1200   HCT 39.4 10/07/2017 1200   PLT 319 01/03/2017 1624   MCV 83 10/07/2017 1200   MCH 26.6 10/07/2017 1200   MCH 25.1 (L) 01/03/2017 1624   MCHC 32.0 10/07/2017 1200   MCHC 32.4 01/03/2017 1624   RDW 16.6 (H) 10/07/2017 1200   LYMPHSABS 2.8 10/07/2017 1200   MONOABS 0.9 07/22/2013 1515   EOSABS 0.2 10/07/2017 1200   BASOSABS 0.0 10/07/2017 1200   Iron/TIBC/Ferritin/ %Sat No results found for: IRON, TIBC, FERRITIN, IRONPCTSAT Lipid Panel     Component Value Date/Time   CHOL 190 02/26/2018 0849   TRIG 73 02/26/2018 0849   HDL 54 02/26/2018 0849   LDLCALC 121 (H) 02/26/2018 0849   Hepatic Function Panel     Component Value Date/Time   PROT 7.3 02/26/2018 0849   ALBUMIN 4.2 02/26/2018 0849   AST 17 02/26/2018 0849   ALT 22 02/26/2018 0849   ALKPHOS 66 02/26/2018 0849   BILITOT 0.3 02/26/2018 0849      Component Value Date/Time   TSH 1.730 10/07/2017 1200   Results for KATANYA, SCHLIE "LASHAYE FISK" (MRN [id number]) as of 04/22/2018 09:34  Ref. Range 02/26/2018 08:49  Vitamin D, 25-Hydroxy Latest Ref Range: 30.0 - 100.0 ng/mL 37.1   ASSESSMENT AND PLAN: Vitamin D deficiency - Plan: Vitamin D, Ergocalciferol, (DRISDOL) 50000 units CAPS capsule  Prediabetes  At risk for osteoporosis  Class 3 severe obesity with serious comorbidity and body mass index (BMI) of 40.0 to 44.9 in adult, unspecified obesity type ([place])  PLAN:  Vitamin D Deficiency Grace Pennington was informed that low vitamin D levels contributes  to fatigue and are associated with obesity, breast, and colon cancer. She agrees to continue to take prescription Vit D @50 ,000 IU every week #4 with no refills and will follow up for routine testing of vitamin D, at least 2-3 times per year. She was informed of the risk of over-replacement of vitamin D and agrees to not increase her dose unless she discusses this with [place] first. Mihira agrees to follow up in 3 weeks.  At risk for osteopenia and osteoporosis Grace Pennington was given extended (15 minutes) osteoporosis prevention counseling today.  Grace Pennington is at risk for osteopenia and osteoporosis due to her vitamin D deficiency. She was encouraged to take her vitamin D and follow her higher calcium diet and increase strengthening exercise to help strengthen her bones and decrease her risk of osteopenia and osteoporosis.  Pre-Diabetes Grace Pennington will continue to work on weight loss, exercise, and decreasing simple carbohydrates in her diet to help decrease the risk of diabetes. She was informed that eating too many simple carbohydrates or too many calories at one sitting increases the likelihood of GI side effects. Grace Pennington is taking metformin and a prescription was not written today. She agreed to continue with diet, exercise, and weight loss. Grace Pennington agreed to follow up with [place] as directed to monitor her progress.  Obesity Grace Pennington is currently in the action stage of change. As such, her goal is to continue with weight loss efforts. She has agreed to follow the Category 3 plan. Grace Pennington has been instructed to work up to a goal of 150 minutes of combined cardio and strengthening exercise per week for weight loss and overall health benefits. We discussed the following Behavioral Modification Strategies today: increasing lean protein intake and keeping healthy foods in the home.  Grace Pennington has agreed to follow up with our clinic in 3 weeks. She was informed of the importance of frequent follow up visits to maximize her success with  intensive lifestyle modifications for her multiple health conditions.   OBESITY BEHAVIORAL INTERVENTION VISIT  Today's visit was # 12  Starting weight: 255 lbs Starting date: 10/07/17 Today's weight : Weight: 249 lb (112.9 kg)  Today's date: 04/21/2018 Total lbs lost to date: 6  ASK: We discussed the diagnosis of obesity with Grace Pennington today and Grace Pennington agreed to give [place] permission to discuss obesity behavioral modification therapy today.  ASSESS: Grace Pennington has the diagnosis of obesity and her BMI today is 40.21. Grace Pennington is in the action stage of change.   ADVISE: Grace Pennington was educated on the multiple health risks of obesity as well as the benefit of weight loss to improve her health. She was advised of the need for long term treatment and the importance of lifestyle modifications to improve her current health and to decrease her risk of future health problems.  AGREE: Multiple dietary modification options and treatment options were discussed and Grace Pennington agreed to follow the recommendations documented in the above note.  ARRANGE: Grace Pennington was educated on the importance of frequent visits to treat obesity as outlined per CMS and USPSTF guidelines and agreed to schedule her next follow up appointment today.  Lenward Chancellor, am acting as transcriptionist for Abby Potash, PA-C I, Abby Potash, PA-C have reviewed above note and agree with its content

## 2018-05-05 MED FILL — VIT D2 1.25 MG (50,000 UNIT: 1.25 MG | 28 days supply | Qty: 4 | Fill #0

## 2018-05-05 MED FILL — CITALOPRAM HBR 10 MG TABLET: 10 | 90 days supply | Qty: 90 | Fill #0

## 2018-05-12 ENCOUNTER — Ambulatory Visit (INDEPENDENT_AMBULATORY_CARE_PROVIDER_SITE_OTHER): Payer: 59 | Admitting: Physician Assistant

## 2018-05-12 ENCOUNTER — Encounter (INDEPENDENT_AMBULATORY_CARE_PROVIDER_SITE_OTHER): Payer: Self-pay | Admitting: Physician Assistant

## 2018-05-12 VITALS — BP 115/83 | HR 78 | Temp 98.4°F | Ht 66.0 in | Wt 248.0 lb

## 2018-05-12 DIAGNOSIS — Z6841 Body Mass Index (BMI) 40.0 and over, adult: Secondary | ICD-10-CM

## 2018-05-12 DIAGNOSIS — R7303 Prediabetes: Secondary | ICD-10-CM | POA: Diagnosis not present

## 2018-05-12 DIAGNOSIS — E559 Vitamin D deficiency, unspecified: Secondary | ICD-10-CM

## 2018-05-12 DIAGNOSIS — Z9189 Other specified personal risk factors, not elsewhere classified: Secondary | ICD-10-CM | POA: Diagnosis not present

## 2018-05-12 MED ORDER — VITAMIN D (ERGOCALCIFEROL) 1.25 MG (50000 UNIT) PO CAPS: 50000.0000 [IU] | ORAL_CAPSULE | ORAL | 0 refills | Status: DC

## 2018-05-12 MED ORDER — METFORMIN HCL 500 MG PO TABS: 500.0000 mg | ORAL_TABLET | Freq: Two times a day (BID) | ORAL | 0 refills | Status: DC

## 2018-05-13 NOTE — Progress Notes (Signed)
Office: [phone]  /  Fax: [phone]   HPI:   Chief Complaint: OBESITY Grace Pennington is here to discuss her progress with her obesity treatment plan. She is on the Category 3 plan and is following her eating plan approximately 90 % of the time. She states she is exercising with resistance bands 30 minutes 5 times per week. Grace Pennington did well with weight loss. She reports that she has been following the plan closely. She also been working out more using the resistance bands.  Her weight is 248 lb (112.5 kg) today and has had a weight loss of 1 pound over a period of 3 weeks since her last visit. She has lost 7 lbs since starting treatment with [place].  Vitamin D deficiency Grace Pennington has a diagnosis of vitamin D deficiency. She is currently taking prescription vit D and denies nausea, vomiting or muscle weakness.  Pre-Diabetes Grace Pennington has a diagnosis of pre-diabetes based on her elevated Hgb A1c and was informed this puts her at greater risk of developing diabetes. She is taking metformin currently and continues to work on diet and exercise to decrease risk of diabetes. She denies nausea, vomiting, or diarrhea. She also denies polyphagia and hypoglycemia.  At risk for diabetes Grace Pennington is at higher than average risk for developing diabetes due to her pre-diabetes and obesity.   ALLERGIES: No Known Allergies  MEDICATIONS: Current Outpatient Medications on File Prior to Visit  Medication Sig Dispense Refill  . citalopram (CELEXA) 10 MG tablet Take 10 mg by mouth daily.  6  . hydrochlorothiazide (HYDRODIURIL) 25 MG tablet Take 25 mg by mouth daily.    . pantoprazole (PROTONIX) 40 MG tablet Take 40 mg by mouth daily.    . ranitidine (ZANTAC) 150 MG capsule Take 150 mg by mouth 2 (two) times daily.     No current facility-administered medications on file prior to visit.     PAST MEDICAL HISTORY: Past Medical History:  Diagnosis Date  . Hypertension   . Palpitations   . Sleep apnea     PAST  SURGICAL HISTORY: Past Surgical History:  Procedure Laterality Date  . TUBAL LIGATION      SOCIAL HISTORY: Social History   Tobacco Use  . Smoking status: Former Smoker    Last attempt to quit: 07/29/1996    Years since quitting: 21.8  . Smokeless tobacco: Never Used  Substance Use Topics  . Alcohol use: No  . Drug use: No    FAMILY HISTORY: Family History  Problem Relation Age of Onset  . Heart disease Mother   . Diabetes Mother   . Hyperlipidemia Mother   . Hypertension Mother   . Kidney failure Mother   . Diabetes Unknown   . Stroke Unknown   . Hypertension Unknown   . Kidney failure Unknown   . Coronary artery disease Unknown   . Sleep apnea Father   . Diabetes Father   . Hyperlipidemia Father   . Hypertension Father     ROS: Review of Systems  Gastrointestinal: Negative for diarrhea, nausea and vomiting.  Musculoskeletal:       Negative for muscle weakness.  Endo/Heme/Allergies:       Negative for hypoglycemia. Negative for polyphagia.    PHYSICAL EXAM: Blood pressure 115/83, pulse 78, temperature 98.4 F (36.9 C), temperature source Oral, height 5\' 6"  (1.676 m), weight 248 lb (112.5 kg), SpO2 98 %. Body mass index is 40.03 kg/m. Physical Exam  Constitutional: She is oriented to person, place, and time.  She appears well-developed and well-nourished.  Cardiovascular: Normal rate.  Pulmonary/Chest: Effort normal.  Musculoskeletal: Normal range of motion.  Neurological: She is oriented to person, place, and time.  Skin: Skin is warm and dry.  Psychiatric: She has a normal mood and affect. Her behavior is normal.  Vitals reviewed.   RECENT LABS AND TESTS: BMET    Component Value Date/Time   NA 142 02/26/2018 0849   K 4.0 02/26/2018 0849   CL 101 02/26/2018 0849   CO2 24 02/26/2018 0849   GLUCOSE 83 02/26/2018 0849   GLUCOSE 147 (H) 01/03/2017 1624   BUN 17 02/26/2018 0849   CREATININE 0.97 02/26/2018 0849   CALCIUM 9.6 02/26/2018 0849    GFRNONAA 70 02/26/2018 0849   GFRAA 81 02/26/2018 0849   Lab Results  Component Value Date   HGBA1C 5.9 (H) 02/26/2018   HGBA1C 6.4 (H) 10/07/2017   Lab Results  Component Value Date   INSULIN 16.9 02/26/2018   INSULIN 14.3 10/07/2017   CBC    Component Value Date/Time   WBC 5.9 10/07/2017 1200   WBC 7.4 01/03/2017 1624   RBC 4.73 10/07/2017 1200   RBC 4.54 01/03/2017 1624   HGB 12.6 10/07/2017 1200   HCT 39.4 10/07/2017 1200   PLT 319 01/03/2017 1624   MCV 83 10/07/2017 1200   MCH 26.6 10/07/2017 1200   MCH 25.1 (L) 01/03/2017 1624   MCHC 32.0 10/07/2017 1200   MCHC 32.4 01/03/2017 1624   RDW 16.6 (H) 10/07/2017 1200   LYMPHSABS 2.8 10/07/2017 1200   MONOABS 0.9 07/22/2013 1515   EOSABS 0.2 10/07/2017 1200   BASOSABS 0.0 10/07/2017 1200   Iron/TIBC/Ferritin/ %Sat No results found for: IRON, TIBC, FERRITIN, IRONPCTSAT Lipid Panel     Component Value Date/Time   CHOL 190 02/26/2018 0849   TRIG 73 02/26/2018 0849   HDL 54 02/26/2018 0849   LDLCALC 121 (H) 02/26/2018 0849   Hepatic Function Panel     Component Value Date/Time   PROT 7.3 02/26/2018 0849   ALBUMIN 4.2 02/26/2018 0849   AST 17 02/26/2018 0849   ALT 22 02/26/2018 0849   ALKPHOS 66 02/26/2018 0849   BILITOT 0.3 02/26/2018 0849      Component Value Date/Time   TSH 1.730 10/07/2017 1200   Results for CUCA, BENASSI "YEVETTE KNUST" (MRN [id number]) as of 05/13/2018 12:12  Ref. Range 02/26/2018 08:49  Vitamin D, 25-Hydroxy Latest Ref Range: 30.0 - 100.0 ng/mL 37.1   ASSESSMENT AND PLAN: Vitamin D deficiency - Plan: Vitamin D, Ergocalciferol, (DRISDOL) 50000 units CAPS capsule  Prediabetes - Plan: metFORMIN (GLUCOPHAGE) 500 MG tablet  At risk for diabetes mellitus  Class 3 severe obesity with serious comorbidity and body mass index (BMI) of 40.0 to 44.9 in adult, unspecified obesity type (Grace Pennington)  PLAN:  Vitamin D Deficiency Grace Pennington was informed that low vitamin D levels contributes to fatigue and  are associated with obesity, breast, and colon cancer. She agrees to continue to take prescription Vit D @50 ,000 IU every week #4 with no refills and will follow up for routine testing of vitamin D, at least 2-3 times per year. She was informed of the risk of over-replacement of vitamin D and agrees to not increase her dose unless she discusses this with [place] first. Grace Pennington agrees to follow up in 3 weeks.  Pre-Diabetes Grace Pennington will continue to work on weight loss, exercise, and decreasing simple carbohydrates in her diet to help decrease the risk of diabetes. She was  informed that eating too many simple carbohydrates or too many calories at one sitting increases the likelihood of GI side effects. Grace Pennington agreed to continue metformin 500mg  BID #60 with no refills and a prescription was written today. Grace Pennington agreed to follow up with [place] as directed to monitor her progress in 3 weeks.  Diabetes risk counselling Grace Pennington was given extended (15 minutes) diabetes prevention counseling today. She is 47 y.o. female and has risk factors for diabetes including pre-diabetes and obesity. We discussed intensive lifestyle modifications today with an emphasis on weight loss as well as increasing exercise and decreasing simple carbohydrates in her diet.  Obesity Grace Pennington is currently in the action stage of change. As such, her goal is to continue with weight loss efforts. She has agreed to follow the Category 3 plan. Grace Pennington has been instructed to work up to a goal of 150 minutes of combined cardio and strengthening exercise per week for weight loss and overall health benefits. We discussed the following Behavioral Modification Strategies today: work on meal planning and easy cooking plans and better snacking choices.  Grace Pennington has agreed to follow up with our clinic in 3 weeks. She was informed of the importance of frequent follow up visits to maximize her success with intensive lifestyle modifications for her multiple health  conditions.   OBESITY BEHAVIORAL INTERVENTION VISIT  Today's visit was # 13   Starting weight: 255 lbs Starting date: 10/07/17 Today's weight : Weight: 248 lb (112.5 kg)  Today's date: 05/12/2018 Total lbs lost to date: 7  ASK: We discussed the diagnosis of obesity with Grace Pennington today and Grace Pennington agreed to give [place] permission to discuss obesity behavioral modification therapy today.  ASSESS: Grace Pennington has the diagnosis of obesity and her BMI today is 40.05 Grace Pennington is in the action stage of change.   ADVISE: Grace Pennington was educated on the multiple health risks of obesity as well as the benefit of weight loss to improve her health. She was advised of the need for long term treatment and the importance of lifestyle modifications to improve her current health and to decrease her risk of future health problems.  AGREE: Multiple dietary modification options and treatment options were discussed and Grace Pennington agreed to follow the recommendations documented in the above note.  ARRANGE: Grace Pennington was educated on the importance of frequent visits to treat obesity as outlined per CMS and USPSTF guidelines and agreed to schedule her next follow up appointment today.  Lenward Chancellor, am acting as transcriptionist for Abby Potash, PA-C I, Abby Potash, PA-C have reviewed above note and agree with its content

## 2018-05-27 MED FILL — VIT D2 1.25 MG (50,000 UNIT: 1.25 MG | 28 days supply | Qty: 4 | Fill #0

## 2018-05-27 MED FILL — metFORMIN HCL 500 MG TABS: 500 | 30 days supply | Qty: 60 | Fill #0

## 2018-06-02 ENCOUNTER — Ambulatory Visit (INDEPENDENT_AMBULATORY_CARE_PROVIDER_SITE_OTHER): Payer: 59 | Admitting: Physician Assistant

## 2018-06-02 ENCOUNTER — Encounter (INDEPENDENT_AMBULATORY_CARE_PROVIDER_SITE_OTHER): Payer: Self-pay | Admitting: Physician Assistant

## 2018-06-02 VITALS — BP 108/76 | HR 83 | Temp 98.2°F | Ht 66.0 in | Wt 249.0 lb

## 2018-06-02 DIAGNOSIS — E559 Vitamin D deficiency, unspecified: Secondary | ICD-10-CM

## 2018-06-02 DIAGNOSIS — Z6841 Body Mass Index (BMI) 40.0 and over, adult: Secondary | ICD-10-CM | POA: Diagnosis not present

## 2018-06-02 DIAGNOSIS — R7303 Prediabetes: Secondary | ICD-10-CM

## 2018-06-02 DIAGNOSIS — Z9189 Other specified personal risk factors, not elsewhere classified: Secondary | ICD-10-CM

## 2018-06-02 MED ORDER — VITAMIN D (ERGOCALCIFEROL) 1.25 MG (50000 UNIT) PO CAPS: 50000.0000 [IU] | ORAL_CAPSULE | ORAL | 0 refills | Status: DC

## 2018-06-03 NOTE — Progress Notes (Signed)
Office: [phone]  /  Fax: [phone]   HPI:   Chief Complaint: OBESITY Grace Pennington is here to discuss her progress with her obesity treatment plan. She is on the Category 3 plan and is following her eating plan approximately 60 % of the time. She states she is walking and using resistance bands for 30 minutes 5 times per week. Grace Pennington reports that she is skipping meals due to her work schedule. She is traveling to the [place] on a cruise and she is asking about travel strategies.  Her weight is 249 lb (112.9 kg) today and has gained 1 pounds since her last visit. She has lost 6 lbs since starting treatment with [place].  Vitamin D Deficiency Grace Pennington has a diagnosis of vitamin D deficiency. She is currently taking prescription Vit D and denies nausea, vomiting or muscle weakness.  At risk for osteopenia and osteoporosis Grace Pennington is at higher risk of osteopenia and osteoporosis due to vitamin D deficiency.   Pre-Diabetes Grace Pennington has a diagnosis of pre-diabetes based on her elevated Hgb A1c and was informed this puts her at greater risk of developing diabetes. She denies nausea, vomiting, or diarrhea on metformin and continues to work on diet and exercise to decrease risk of diabetes. She denies polyphagia or hypoglycemia.  ALLERGIES: No Known Allergies  MEDICATIONS: Current Outpatient Medications on File Prior to Visit  Medication Sig Dispense Refill  . citalopram (CELEXA) 10 MG tablet Take 10 mg by mouth daily.  6  . hydrochlorothiazide (HYDRODIURIL) 25 MG tablet Take 25 mg by mouth daily.    . metFORMIN (GLUCOPHAGE) 500 MG tablet Take 1 tablet (500 mg total) by mouth 2 (two) times daily with a meal. 60 tablet 0  . pantoprazole (PROTONIX) 40 MG tablet Take 40 mg by mouth daily.    . ranitidine (ZANTAC) 150 MG capsule Take 150 mg by mouth 2 (two) times daily.     No current facility-administered medications on file prior to visit.     PAST MEDICAL HISTORY: Past Medical History:  Diagnosis  Date  . Hypertension   . Palpitations   . Sleep apnea     PAST SURGICAL HISTORY: Past Surgical History:  Procedure Laterality Date  . TUBAL LIGATION      SOCIAL HISTORY: Social History   Tobacco Use  . Smoking status: Former Smoker    Last attempt to quit: 07/29/1996    Years since quitting: 21.8  . Smokeless tobacco: Never Used  Substance Use Topics  . Alcohol use: No  . Drug use: No    FAMILY HISTORY: Family History  Problem Relation Age of Onset  . Heart disease Mother   . Diabetes Mother   . Hyperlipidemia Mother   . Hypertension Mother   . Kidney failure Mother   . Diabetes Unknown   . Stroke Unknown   . Hypertension Unknown   . Kidney failure Unknown   . Coronary artery disease Unknown   . Sleep apnea Father   . Diabetes Father   . Hyperlipidemia Father   . Hypertension Father     ROS: Review of Systems  Constitutional: Negative for weight loss.  Gastrointestinal: Negative for diarrhea, nausea and vomiting.  Musculoskeletal:       Negative muscle weakness  Endo/Heme/Allergies:       Negative polyphagia Negative hypoglycemia    PHYSICAL EXAM: Blood pressure 108/76, pulse 83, temperature 98.2 F (36.8 C), temperature source Oral, height 5\' 6"  (1.676 m), weight 249 lb (112.9 kg), SpO2 97 %.  Body mass index is 40.19 kg/m. Physical Exam  Constitutional: She is oriented to person, place, and time. She appears well-developed and well-nourished.  Cardiovascular: Normal rate.  Pulmonary/Chest: Effort normal.  Musculoskeletal: Normal range of motion.  Neurological: She is oriented to person, place, and time.  Skin: Skin is warm and dry.  Psychiatric: She has a normal mood and affect. Her behavior is normal.  Vitals reviewed.   RECENT LABS AND TESTS: BMET    Component Value Date/Time   NA 142 02/26/2018 0849   K 4.0 02/26/2018 0849   CL 101 02/26/2018 0849   CO2 24 02/26/2018 0849   GLUCOSE 83 02/26/2018 0849   GLUCOSE 147 (H) 01/03/2017 1624     BUN 17 02/26/2018 0849   CREATININE 0.97 02/26/2018 0849   CALCIUM 9.6 02/26/2018 0849   GFRNONAA 70 02/26/2018 0849   GFRAA 81 02/26/2018 0849   Lab Results  Component Value Date   HGBA1C 5.9 (H) 02/26/2018   HGBA1C 6.4 (H) 10/07/2017   Lab Results  Component Value Date   INSULIN 16.9 02/26/2018   INSULIN 14.3 10/07/2017   CBC    Component Value Date/Time   WBC 5.9 10/07/2017 1200   WBC 7.4 01/03/2017 1624   RBC 4.73 10/07/2017 1200   RBC 4.54 01/03/2017 1624   HGB 12.6 10/07/2017 1200   HCT 39.4 10/07/2017 1200   PLT 319 01/03/2017 1624   MCV 83 10/07/2017 1200   MCH 26.6 10/07/2017 1200   MCH 25.1 (L) 01/03/2017 1624   MCHC 32.0 10/07/2017 1200   MCHC 32.4 01/03/2017 1624   RDW 16.6 (H) 10/07/2017 1200   LYMPHSABS 2.8 10/07/2017 1200   MONOABS 0.9 07/22/2013 1515   EOSABS 0.2 10/07/2017 1200   BASOSABS 0.0 10/07/2017 1200   Iron/TIBC/Ferritin/ %Sat No results found for: IRON, TIBC, FERRITIN, IRONPCTSAT Lipid Panel     Component Value Date/Time   CHOL 190 02/26/2018 0849   TRIG 73 02/26/2018 0849   HDL 54 02/26/2018 0849   LDLCALC 121 (H) 02/26/2018 0849   Hepatic Function Panel     Component Value Date/Time   PROT 7.3 02/26/2018 0849   ALBUMIN 4.2 02/26/2018 0849   AST 17 02/26/2018 0849   ALT 22 02/26/2018 0849   ALKPHOS 66 02/26/2018 0849   BILITOT 0.3 02/26/2018 0849      Component Value Date/Time   TSH 1.730 10/07/2017 1200  Results for SHANEKA, EFAW "JAMESON MORROW" (MRN [id number]) as of 06/03/2018 10:25  Ref. Range 02/26/2018 08:49  Vitamin D, 25-Hydroxy Latest Ref Range: 30.0 - 100.0 ng/mL 37.1    ASSESSMENT AND PLAN: Vitamin D deficiency - Plan: Vitamin D, Ergocalciferol, (DRISDOL) 50000 units CAPS capsule  Prediabetes  At risk for osteoporosis  Class 3 severe obesity with serious comorbidity and body mass index (BMI) of 40.0 to 44.9 in adult, unspecified obesity type ([place])  PLAN:  Vitamin D Deficiency Grace Pennington was informed that low  vitamin D levels contributes to fatigue and are associated with obesity, breast, and colon cancer. Grace Pennington agrees to continue taking prescription Vit D @50 ,000 IU every week #4 and and we will refill for 1 month. She will follow up for routine testing of vitamin D, at least 2-3 times per year. She was informed of the risk of over-replacement of vitamin D and agrees to not increase her dose unless she discusses this with [place] first. Grace Pennington agrees to follow up with our clinic in 4 weeks.  At risk for osteopenia and osteoporosis Grace Pennington was  given extended (15 minutes) osteoporosis prevention counseling today. Grace Pennington is at risk for osteopenia and osteoporsis due to her vitamin D deficiency. She was encouraged to take her vitamin D and follow her higher calcium diet and increase strengthening exercise to help strengthen her bones and decrease her risk of osteopenia and osteoporosis.  Pre-Diabetes Grace Pennington will continue to work on weight loss, diet, exercise, and decreasing simple carbohydrates in her diet to help decrease the risk of diabetes. We dicussed metformin including benefits and risks. She was informed that eating too many simple carbohydrates or too many calories at one sitting increases the likelihood of GI side effects. Grace Pennington agrees to continue taking metformin, and she agrees to follow up with our clinic in 4 weeks as directed to monitor her progress.  Obesity Grace Pennington is currently in the action stage of change. As such, her goal is to continue with weight loss efforts She has agreed to follow the Category 3 plan Grace Pennington has been instructed to work up to a goal of 150 minutes of combined cardio and strengthening exercise per week for weight loss and overall health benefits. We discussed the following Behavioral Modification Strategies today: work on meal planning and easy cooking plans, holiday eating strategies, travel eating strategies, and no skipping meals   Grace Pennington has agreed to follow up with our clinic  in 4 weeks. She was informed of the importance of frequent follow up visits to maximize her success with intensive lifestyle modifications for her multiple health conditions.   OBESITY BEHAVIORAL INTERVENTION VISIT  Today's visit was # 14   Starting weight: 255 lbs Starting date: 10/07/17 Today's weight : 249 lbs Today's date: 06/02/2018 Total lbs lost to date: 6    ASK: We discussed the diagnosis of obesity with Grace Pennington today and Grace Pennington agreed to give [place] permission to discuss obesity behavioral modification therapy today.  ASSESS: Grace Pennington has the diagnosis of obesity and her BMI today is 40.21 Grace Pennington is in the action stage of change   ADVISE: Hagen was educated on the multiple health risks of obesity as well as the benefit of weight loss to improve her health. She was advised of the need for long term treatment and the importance of lifestyle modifications.  AGREE: Multiple dietary modification options and treatment options were discussed and  Annalisse agreed to the above obesity treatment plan.  Wilhemena Durie, am acting as transcriptionist for Abby Potash, PA-C I, Abby Potash, PA-C have reviewed above note and agree with its content

## 2018-06-09 MED FILL — PANTOPRAZOLE SOD DR 40 MG T: 40 | 30 days supply | Qty: 30 | Fill #0

## 2018-06-30 MED FILL — HYDROCHLOROTHIAZIDE 25 MG T: 25 | 90 days supply | Qty: 90 | Fill #2

## 2018-07-01 ENCOUNTER — Encounter (INDEPENDENT_AMBULATORY_CARE_PROVIDER_SITE_OTHER): Payer: Self-pay

## 2018-07-01 ENCOUNTER — Ambulatory Visit (INDEPENDENT_AMBULATORY_CARE_PROVIDER_SITE_OTHER): Payer: 59 | Admitting: Physician Assistant

## 2018-07-14 MED FILL — PANTOPRAZOLE SOD DR 40 MG T: 40 | 30 days supply | Qty: 30 | Fill #0

## 2018-07-27 ENCOUNTER — Other Ambulatory Visit (INDEPENDENT_AMBULATORY_CARE_PROVIDER_SITE_OTHER): Payer: Self-pay | Admitting: Physician Assistant

## 2018-07-27 DIAGNOSIS — R7303 Prediabetes: Secondary | ICD-10-CM

## 2018-07-27 MED FILL — VIT D2 1.25 MG (50,000 UNIT: 1.25 MG | 28 days supply | Qty: 4 | Fill #0

## 2018-07-31 ENCOUNTER — Other Ambulatory Visit (INDEPENDENT_AMBULATORY_CARE_PROVIDER_SITE_OTHER): Payer: Self-pay | Admitting: Physician Assistant

## 2018-07-31 DIAGNOSIS — R7303 Prediabetes: Secondary | ICD-10-CM

## 2018-08-13 ENCOUNTER — Ambulatory Visit (INDEPENDENT_AMBULATORY_CARE_PROVIDER_SITE_OTHER): Payer: 59 | Admitting: Physician Assistant

## 2018-08-13 VITALS — BP 106/74 | HR 71 | Temp 97.7°F | Ht 66.0 in | Wt 252.0 lb

## 2018-08-13 DIAGNOSIS — E559 Vitamin D deficiency, unspecified: Secondary | ICD-10-CM | POA: Diagnosis not present

## 2018-08-13 DIAGNOSIS — E7849 Other hyperlipidemia: Secondary | ICD-10-CM

## 2018-08-13 DIAGNOSIS — R7303 Prediabetes: Secondary | ICD-10-CM

## 2018-08-13 DIAGNOSIS — Z9189 Other specified personal risk factors, not elsewhere classified: Secondary | ICD-10-CM | POA: Diagnosis not present

## 2018-08-13 DIAGNOSIS — Z6841 Body Mass Index (BMI) 40.0 and over, adult: Secondary | ICD-10-CM

## 2018-08-13 MED ORDER — METFORMIN HCL 500 MG PO TABS: 500.0000 mg | ORAL_TABLET | Freq: Two times a day (BID) | ORAL | 0 refills | Status: DC

## 2018-08-13 MED ORDER — VITAMIN D (ERGOCALCIFEROL) 1.25 MG (50000 UNIT) PO CAPS: 50000.0000 [IU] | ORAL_CAPSULE | ORAL | 0 refills | Status: DC

## 2018-08-13 MED FILL — metFORMIN HCL 500 MG TABS: 500 | 30 days supply | Qty: 60 | Fill #0

## 2018-08-13 NOTE — Progress Notes (Signed)
Office: [phone]  /  Fax: [phone]   HPI:   Chief Complaint: OBESITY Grace Pennington is here to discuss her progress with her obesity treatment plan. She is on the Category 3 plan and is following her eating plan approximately 50 % of the time. She states she is using resistance bands and walking 30 minutes 3 times per week. Grace Pennington reports that she struggled to follow the plan over the holidays. She has started exercising and is ready to get back on track. Her weight is 252 lb (114.3 kg) today and she has lost 0 lbs since her last visit. She has lost 3 lbs since starting treatment with [place].  Pre-Diabetes [name] has a diagnosis of prediabetes based on her elevated Hgb A1c and was informed this puts her at greater risk of developing diabetes. She is taking metformin currently and continues to work on diet and exercise to decrease risk of diabetes. She denies nausea or hypoglycemia. She denies polyphagia.  Vitamin D deficiency Grace Pennington has a diagnosis of vitamin D deficiency. She is currently taking prescription Vit D and denies nausea, vomiting or muscle weakness.  Hyperlipidemia Grace Pennington has hyperlipidemia and has been trying to improve her cholesterol levels with intensive lifestyle modification including a low saturated fat diet, exercise and weight loss. She is not on any medications. She denies any chest pain.  At risk for cardiovascular disease Grace Pennington is at a higher than average risk for cardiovascular disease due to obesity. She currently denies any chest pain.  ASSESSMENT AND PLAN:  Prediabetes - Plan: Comprehensive metabolic panel, Hemoglobin A1c, Insulin, random, metFORMIN (GLUCOPHAGE) 500 MG tablet  Vitamin D deficiency - Plan: VITAMIN D 25 Hydroxy (Vit-D Deficiency, Fractures), Vitamin D, Ergocalciferol, (DRISDOL) 1.25 MG (50000 UT) CAPS capsule  Other hyperlipidemia - Plan: Lipid Panel With LDL/HDL Ratio  At risk for heart disease  Class 3 severe obesity with serious comorbidity  and body mass index (BMI) of 40.0 to 44.9 in adult, unspecified obesity type ([place])  PLAN:  Pre-Diabetes Grace Pennington will continue to work on weight loss, exercise, and decreasing simple carbohydrates in her diet to help decrease the risk of diabetes. We dicussed metformin including benefits and risks. She was informed that eating too many simple carbohydrates or too many calories at one sitting increases the likelihood of GI side effects. Grace Pennington agreed to continue taking metformin 500 mg bid #60 with no refills for now and a prescription was written today. We will check labs today. [name] agrees to follow up with our clinic in 2 weeks.  Vitamin D Deficiency Grace Pennington was informed that low vitamin D levels contributes to fatigue and are associated with obesity, breast, and colon cancer. She agrees to continue to take prescription Vit D @50 ,000 IU every week #4 with no refills and will follow up for routine testing of vitamin D, at least 2-3 times per year. She was informed of the risk of over-replacement of vitamin D and agrees to not increase her dose unless she discusses this with [place] first. We will check labs today. Grace Pennington agrees to follow up with our clinic in 2 weeks.  Hyperlipidemia Grace Pennington was informed of the American Heart Association Guidelines emphasizing intensive lifestyle modifications as the first line treatment for hyperlipidemia. We discussed many lifestyle modifications today in depth, and [name] will continue to work on decreasing saturated fats such as fatty red meat, butter and many fried foods. She will also increase vegetables and lean protein in her diet and continue to work on exercise and  weight loss efforts. She is not on any medication currently. We will check labs today. Grace Pennington agrees to follow up with our clinic in 2 weeks.  Cardiovascular risk counseling Grace Pennington was given extended (15 minutes) coronary artery disease prevention counseling today. She is 48 y.o. female and has risk factors for  heart disease including obesity. We discussed intensive lifestyle modifications today with an emphasis on specific weight loss instructions and strategies. Pt was also informed of the importance of increasing exercise and decreasing saturated fats to help prevent heart disease.  Obesity [name] is currently in the action stage of change. As such, her goal is to continue with weight loss efforts She has agreed to follow the Category 3 plan Grace Pennington has been instructed to work up to a goal of 150 minutes of combined cardio and strengthening exercise per week for weight loss and overall health benefits. We discussed the following Behavioral Modification Strategies today: increasing lean protein intake and work on meal planning and easy cooking plans  Grace Pennington has agreed to follow up with our clinic in 2 weeks. She was informed of the importance of frequent follow up visits to maximize her success with intensive lifestyle modifications for her multiple health conditions.  ALLERGIES: No Known Allergies  MEDICATIONS: Current Outpatient Medications on File Prior to Visit  Medication Sig Dispense Refill  . citalopram (CELEXA) 10 MG tablet Take 10 mg by mouth daily.  6  . hydrochlorothiazide (HYDRODIURIL) 25 MG tablet Take 25 mg by mouth daily.    . pantoprazole (PROTONIX) 40 MG tablet Take 40 mg by mouth daily.    . ranitidine (ZANTAC) 150 MG capsule Take 150 mg by mouth 2 (two) times daily.     No current facility-administered medications on file prior to visit.     PAST MEDICAL HISTORY: Past Medical History:  Diagnosis Date  . Hypertension   . Palpitations   . Sleep apnea     PAST SURGICAL HISTORY: Past Surgical History:  Procedure Laterality Date  . TUBAL LIGATION      SOCIAL HISTORY: Social History   Tobacco Use  . Smoking status: Former Smoker    Last attempt to quit: 07/29/1996    Years since quitting: 22.0  . Smokeless tobacco: Never Used  Substance Use Topics  . Alcohol use: No    . Drug use: No    FAMILY HISTORY: Family History  Problem Relation Age of Onset  . Heart disease Mother   . Diabetes Mother   . Hyperlipidemia Mother   . Hypertension Mother   . Kidney failure Mother   . Diabetes Unknown   . Stroke Unknown   . Hypertension Unknown   . Kidney failure Unknown   . Coronary artery disease Unknown   . Sleep apnea Father   . Diabetes Father   . Hyperlipidemia Father   . Hypertension Father     ROS: Review of Systems  Constitutional: Negative for weight loss.  Cardiovascular: Negative for chest pain and claudication.  Gastrointestinal: Negative for nausea and vomiting.  Genitourinary: Negative for frequency.  Musculoskeletal:       Negative for muscle weakness  Endo/Heme/Allergies: Negative for polydipsia.       Negative for hypoglycemia Negative for polyphagia    PHYSICAL EXAM: Blood pressure 106/74, pulse 71, temperature 97.7 F (36.5 C), temperature source Oral, height 5\' 6"  (1.676 m), weight 252 lb (114.3 kg), SpO2 96 %. Body mass index is 40.67 kg/m. Physical Exam Vitals signs reviewed.  Constitutional:  Appearance: Normal appearance. She is obese.  Cardiovascular:     Rate and Rhythm: Normal rate.     Pulses: Normal pulses.  Pulmonary:     Effort: Pulmonary effort is normal.  Musculoskeletal: Normal range of motion.  Skin:    General: Skin is warm and dry.  Neurological:     Mental Status: She is alert and oriented to person, place, and time.  Psychiatric:        Mood and Affect: Mood normal.        Behavior: Behavior normal.     RECENT LABS AND TESTS: BMET    Component Value Date/Time   NA 142 02/26/2018 0849   K 4.0 02/26/2018 0849   CL 101 02/26/2018 0849   CO2 24 02/26/2018 0849   GLUCOSE 83 02/26/2018 0849   GLUCOSE 147 (H) 01/03/2017 1624   BUN 17 02/26/2018 0849   CREATININE 0.97 02/26/2018 0849   CALCIUM 9.6 02/26/2018 0849   GFRNONAA 70 02/26/2018 0849   GFRAA 81 02/26/2018 0849   Lab Results   Component Value Date   HGBA1C 5.9 (H) 02/26/2018   HGBA1C 6.4 (H) 10/07/2017   Lab Results  Component Value Date   INSULIN 16.9 02/26/2018   INSULIN 14.3 10/07/2017   CBC    Component Value Date/Time   WBC 5.9 10/07/2017 1200   WBC 7.4 01/03/2017 1624   RBC 4.73 10/07/2017 1200   RBC 4.54 01/03/2017 1624   HGB 12.6 10/07/2017 1200   HCT 39.4 10/07/2017 1200   PLT 319 01/03/2017 1624   MCV 83 10/07/2017 1200   MCH 26.6 10/07/2017 1200   MCH 25.1 (L) 01/03/2017 1624   MCHC 32.0 10/07/2017 1200   MCHC 32.4 01/03/2017 1624   RDW 16.6 (H) 10/07/2017 1200   LYMPHSABS 2.8 10/07/2017 1200   MONOABS 0.9 07/22/2013 1515   EOSABS 0.2 10/07/2017 1200   BASOSABS 0.0 10/07/2017 1200   Iron/TIBC/Ferritin/ %Sat No results found for: IRON, TIBC, FERRITIN, IRONPCTSAT Lipid Panel     Component Value Date/Time   CHOL 190 02/26/2018 0849   TRIG 73 02/26/2018 0849   HDL 54 02/26/2018 0849   LDLCALC 121 (H) 02/26/2018 0849   Hepatic Function Panel     Component Value Date/Time   PROT 7.3 02/26/2018 0849   ALBUMIN 4.2 02/26/2018 0849   AST 17 02/26/2018 0849   ALT 22 02/26/2018 0849   ALKPHOS 66 02/26/2018 0849   BILITOT 0.3 02/26/2018 0849      Component Value Date/Time   TSH 1.730 10/07/2017 1200     Ref. Range 02/26/2018 08:49  Vitamin D, 25-Hydroxy Latest Ref Range: 30.0 - 100.0 ng/mL 37.1     OBESITY BEHAVIORAL INTERVENTION VISIT  Today's visit was # 15   Starting weight: 255 lbs Starting date: 10/07/2017 Today's weight :: 252 lb  Today's date: 08/13/2018 Total lbs lost to date: 3   ASK: We discussed the diagnosis of obesity with Reino Kent today and Kenney Houseman agreed to give [place] permission to discuss obesity behavioral modification therapy today.  ASSESS: Sharlette has the diagnosis of obesity and her BMI today is 40.69 Keymiah is in the action stage of change   ADVISE: Jacy was educated on the multiple health risks of obesity as well as the benefit of weight loss  to improve her health. She was advised of the need for long term treatment and the importance of lifestyle modifications to improve her current health and to decrease her risk of future health problems.  AGREE: Multiple dietary modification options and treatment options were discussed and  Turner agreed to follow the recommendations documented in the above note.  ARRANGE: Keilly was educated on the importance of frequent visits to treat obesity as outlined per CMS and USPSTF guidelines and agreed to schedule her next follow up appointment today.  I, Tammy Wysor, am acting as Location manager for [employer] I, Abby Potash, PA-C have reviewed above note and agree with its content

## 2018-08-14 LAB — COMPREHENSIVE METABOLIC PANEL
ALT: 28 IU/L (ref 0–32)
AST: 20 IU/L (ref 0–40)
Albumin/Globulin Ratio: 1.4 (ref 1.2–2.2)
Albumin: 4.2 g/dL (ref 3.5–5.5)
Alkaline Phosphatase: 77 IU/L (ref 39–117)
BUN/Creatinine Ratio: 20 (ref 9–23)
BUN: 19 mg/dL (ref 6–24)
Bilirubin Total: 0.2 mg/dL (ref 0.0–1.2)
CO2: 25 mmol/L (ref 20–29)
Calcium: 9.6 mg/dL (ref 8.7–10.2)
Chloride: 102 mmol/L (ref 96–106)
Creatinine, Ser: 0.95 mg/dL (ref 0.57–1.00)
GFR calc Af Amer: 83 mL/min/{1.73_m2} (ref >59)
GFR calc non Af Amer: 72 mL/min/{1.73_m2} (ref >59)
Globulin, Total: 3.1 g/dL (ref 1.5–4.5)
Glucose: 96 mg/dL (ref 65–99)
Potassium: 4.1 mmol/L (ref 3.5–5.2)
Sodium: 143 mmol/L (ref 134–144)
Total Protein: 7.3 g/dL (ref 6.0–8.5)

## 2018-08-14 LAB — HEMOGLOBIN A1C
Est. average glucose Bld gHb Est-mCnc: 131 mg/dL
Hgb A1c MFr Bld: 6.2 % — ABNORMAL HIGH (ref 4.8–5.6)

## 2018-08-14 LAB — LIPID PANEL WITH LDL/HDL RATIO
Cholesterol, Total: 192 mg/dL (ref 100–199)
HDL: 52 mg/dL (ref >39)
LDL Calculated: 131 mg/dL — ABNORMAL HIGH (ref 0–99)
LDl/HDL Ratio: 2.5 ratio (ref 0.0–3.2)
Triglycerides: 43 mg/dL (ref 0–149)
VLDL Cholesterol Cal: 9 mg/dL (ref 5–40)

## 2018-08-14 LAB — VITAMIN D 25 HYDROXY (VIT D DEFICIENCY, FRACTURES): Vit D, 25-Hydroxy: 45.1 ng/mL (ref 30.0–100.0)

## 2018-08-14 LAB — INSULIN, RANDOM: INSULIN: 26.3 u[IU]/mL — ABNORMAL HIGH (ref 2.6–24.9)

## 2018-08-26 MED FILL — PANTOPRAZOLE SOD DR 40 MG T: 40 | 90 days supply | Qty: 90 | Fill #1

## 2018-08-26 MED FILL — CITALOPRAM HBR 10 MG TABLET: 10 | 90 days supply | Qty: 90 | Fill #1

## 2018-08-27 ENCOUNTER — Ambulatory Visit (INDEPENDENT_AMBULATORY_CARE_PROVIDER_SITE_OTHER): Payer: 59 | Admitting: Physician Assistant

## 2018-08-27 ENCOUNTER — Encounter (INDEPENDENT_AMBULATORY_CARE_PROVIDER_SITE_OTHER): Payer: Self-pay | Admitting: Physician Assistant

## 2018-08-27 VITALS — BP 108/74 | HR 86 | Temp 98.0°F | Ht 66.0 in | Wt 252.0 lb

## 2018-08-27 DIAGNOSIS — Z9189 Other specified personal risk factors, not elsewhere classified: Secondary | ICD-10-CM | POA: Diagnosis not present

## 2018-08-27 DIAGNOSIS — R7303 Prediabetes: Secondary | ICD-10-CM | POA: Diagnosis not present

## 2018-08-27 DIAGNOSIS — Z6841 Body Mass Index (BMI) 40.0 and over, adult: Secondary | ICD-10-CM

## 2018-08-27 DIAGNOSIS — E559 Vitamin D deficiency, unspecified: Secondary | ICD-10-CM

## 2018-08-27 MED ORDER — VITAMIN D (ERGOCALCIFEROL) 1.25 MG (50000 UNIT) PO CAPS: 50000.0000 [IU] | ORAL_CAPSULE | ORAL | 0 refills | Status: DC

## 2018-08-31 NOTE — Progress Notes (Signed)
Office: [phone]  /  Fax: [phone]   HPI:   Chief Complaint: OBESITY Grace Pennington is here to discuss her progress with her obesity treatment plan. She is on the Category 3 plan and is following her eating plan approximately 90 % of the time. She states she is kick boxing 60 minutes 3 times per week. Grace Pennington has been undereating her protein at lunch. She has started kick boxing. Her weight is 252 lb (114.3 kg) today and has lost 0 lbs  since her last visit. She has lost 3 lbs since starting treatment with [place].  Vitamin D deficiency Grace Pennington has a diagnosis of vitamin D deficiency. She is currently taking prescription Vit D and denies nausea, vomiting or muscle weakness.  Pre-Diabetes Grace Pennington has a diagnosis of prediabetes based on her elevated Hgb A1c. Her last level was not at goal. Grace Pennington was informed this puts her at greater risk of developing diabetes. She is taking metformin currently and continues to work on diet and exercise to decrease risk of diabetes. She denies nausea, vomiting or diarrhea. She denies polyphagia or hypoglycemia.  Diabetes risk counseling Grace Pennington was given extended (15 minutes) diabetes prevention counseling today. She is 48 y.o. female and has risk factors for diabetes including obesity. We discussed intensive lifestyle modifications today with an emphasis on weight loss as well as increasing exercise and decreasing simple carbohydrates in her diet.  ASSESSMENT AND PLAN:  Vitamin D deficiency - Plan: Vitamin D, Ergocalciferol, (DRISDOL) 1.25 MG (50000 UT) CAPS capsule  Prediabetes  At risk for diabetes mellitus  Class 3 severe obesity with serious comorbidity and body mass index (BMI) of 40.0 to 44.9 in adult, unspecified obesity type ([place])  PLAN:  Vitamin D Deficiency Grace Pennington was informed that low vitamin D levels contributes to fatigue and are associated with obesity, breast, and colon cancer. She agrees to continue to take prescription Vit D @50 ,000 IU every week  #4 with no refills and will follow up for routine testing of vitamin D, at least 2-3 times per year. She was informed of the risk of over-replacement of vitamin D and agrees to not increase her dose unless she discusses this with [place] first. Grace Pennington agrees to follow up with our clinic in 2 weeks.  Pre-Diabetes Grace Pennington will continue to work on weight loss, exercise, and decreasing simple carbohydrates in her diet to help decrease the risk of diabetes. We dicussed metformin including benefits and risks. She was informed that eating too many simple carbohydrates or too many calories at one sitting increases the likelihood of GI side effects. Grace Pennington agrees to continue taking metformin for now and a prescription was not written today. Grace Pennington agrees to follow up with our clinic in 2 weeks.  Diabetes risk counseling Avalin was given extended (15 minutes) diabetes prevention counseling today. She is 48 y.o. female and has risk factors for diabetes including obesity. We discussed intensive lifestyle modifications today with an emphasis on weight loss as well as increasing exercise and decreasing simple carbohydrates in her diet.  Obesity Grace Pennington is currently in the action stage of change. As such, her goal is to continue with weight loss efforts She has Pennington to follow the Category 3 plan Grace Pennington has been instructed to work up to a goal of 150 minutes of combined cardio and strengthening exercise per week for weight loss and overall health benefits. We discussed the following Behavioral Modification Strategies today: increasing lean protein intake and work on meal planning and easy cooking plans  [employer]  has Pennington to follow up with our clinic in 2 weeks. She was informed of the importance of frequent follow up visits to maximize her success with intensive lifestyle modifications for her multiple health conditions.  ALLERGIES: No Known Allergies  MEDICATIONS: Current Outpatient Medications on File Prior to Visit    Medication Sig Dispense Refill  . citalopram (CELEXA) 10 MG tablet Take 10 mg by mouth daily.  6  . hydrochlorothiazide (HYDRODIURIL) 25 MG tablet Take 25 mg by mouth daily.    . metFORMIN (GLUCOPHAGE) 500 MG tablet Take 1 tablet (500 mg total) by mouth 2 (two) times daily with a meal. 60 tablet 0  . pantoprazole (PROTONIX) 40 MG tablet Take 40 mg by mouth daily.    . ranitidine (ZANTAC) 150 MG capsule Take 150 mg by mouth 2 (two) times daily.     No current facility-administered medications on file prior to visit.     PAST MEDICAL HISTORY: Past Medical History:  Diagnosis Date  . Hypertension   . Palpitations   . Sleep apnea     PAST SURGICAL HISTORY: Past Surgical History:  Procedure Laterality Date  . TUBAL LIGATION      SOCIAL HISTORY: Social History   Tobacco Use  . Smoking status: Former Smoker    Last attempt to quit: 07/29/1996    Years since quitting: 22.1  . Smokeless tobacco: Never Used  Substance Use Topics  . Alcohol use: No  . Drug use: No    FAMILY HISTORY: Family History  Problem Relation Age of Onset  . Heart disease Mother   . Diabetes Mother   . Hyperlipidemia Mother   . Hypertension Mother   . Kidney failure Mother   . Diabetes Unknown   . Stroke Unknown   . Hypertension Unknown   . Kidney failure Unknown   . Coronary artery disease Unknown   . Sleep apnea Father   . Diabetes Father   . Hyperlipidemia Father   . Hypertension Father     ROS: Review of Systems  Constitutional: Negative for weight loss.  Gastrointestinal: Negative for nausea and vomiting.  Genitourinary:       Negative for polyuria  Musculoskeletal:       Negative for muscle weakness  Endo/Heme/Allergies:       Negative for hypoglycemia Negative for polyphagia    PHYSICAL EXAM: Blood pressure 108/74, pulse 86, temperature 98 F (36.7 C), temperature source Oral, height 5\' 6"  (1.676 m), weight 252 lb (114.3 kg), SpO2 96 %. Body mass index is 40.67  kg/m. Physical Exam Vitals signs reviewed.  Constitutional:      Appearance: Normal appearance. She is obese.  Cardiovascular:     Rate and Rhythm: Normal rate.     Pulses: Normal pulses.  Pulmonary:     Effort: Pulmonary effort is normal.  Musculoskeletal: Normal range of motion.  Skin:    General: Skin is warm and dry.  Neurological:     Mental Status: She is alert and oriented to person, place, and time.  Psychiatric:        Mood and Affect: Mood normal.        Behavior: Behavior normal.     RECENT LABS AND TESTS: BMET    Component Value Date/Time   NA 143 08/13/2018 0749   K 4.1 08/13/2018 0749   CL 102 08/13/2018 0749   CO2 25 08/13/2018 0749   GLUCOSE 96 08/13/2018 0749   GLUCOSE 147 (H) 01/03/2017 1624   BUN 19 08/13/2018  0749   CREATININE 0.95 08/13/2018 0749   CALCIUM 9.6 08/13/2018 0749   GFRNONAA 72 08/13/2018 0749   GFRAA 83 08/13/2018 0749   Lab Results  Component Value Date   HGBA1C 6.2 (H) 08/13/2018   HGBA1C 5.9 (H) 02/26/2018   HGBA1C 6.4 (H) 10/07/2017   Lab Results  Component Value Date   INSULIN 26.3 (H) 08/13/2018   INSULIN 16.9 02/26/2018   INSULIN 14.3 10/07/2017   CBC    Component Value Date/Time   WBC 5.9 10/07/2017 1200   WBC 7.4 01/03/2017 1624   RBC 4.73 10/07/2017 1200   RBC 4.54 01/03/2017 1624   HGB 12.6 10/07/2017 1200   HCT 39.4 10/07/2017 1200   PLT 319 01/03/2017 1624   MCV 83 10/07/2017 1200   MCH 26.6 10/07/2017 1200   MCH 25.1 (L) 01/03/2017 1624   MCHC 32.0 10/07/2017 1200   MCHC 32.4 01/03/2017 1624   RDW 16.6 (H) 10/07/2017 1200   LYMPHSABS 2.8 10/07/2017 1200   MONOABS 0.9 07/22/2013 1515   EOSABS 0.2 10/07/2017 1200   BASOSABS 0.0 10/07/2017 1200   Iron/TIBC/Ferritin/ %Sat No results found for: IRON, TIBC, FERRITIN, IRONPCTSAT Lipid Panel     Component Value Date/Time   CHOL 192 08/13/2018 0749   TRIG 43 08/13/2018 0749   HDL 52 08/13/2018 0749   LDLCALC 131 (H) 08/13/2018 0749   Hepatic  Function Panel     Component Value Date/Time   PROT 7.3 08/13/2018 0749   ALBUMIN 4.2 08/13/2018 0749   AST 20 08/13/2018 0749   ALT 28 08/13/2018 0749   ALKPHOS 77 08/13/2018 0749   BILITOT 0.2 08/13/2018 0749      Component Value Date/Time   TSH 1.730 10/07/2017 1200     Ref. Range 08/13/2018 07:49  Vitamin D, 25-Hydroxy Latest Ref Range: 30.0 - 100.0 ng/mL 45.1     OBESITY BEHAVIORAL INTERVENTION VISIT  Today's visit was # 16   Starting weight: 255 lbs Starting date: 10/07/2017 Today's weight :: 252 lbs Today's date: 08/27/2018 Total lbs lost to date: 3   ASK: We discussed the diagnosis of obesity with Reino Kent today and Kenney Houseman Pennington to give [place] permission to discuss obesity behavioral modification therapy today.  ASSESS: Ketura has the diagnosis of obesity and her BMI today is 40.69 Cami is in the action stage of change   ADVISE: Shoshannah was educated on the multiple health risks of obesity as well as the benefit of weight loss to improve her health. She was advised of the need for long term treatment and the importance of lifestyle modifications to improve her current health and to decrease her risk of future health problems.  AGREE: Multiple dietary modification options and treatment options were discussed and  Viana Pennington to follow the recommendations documented in the above note.  ARRANGE: Malerie was educated on the importance of frequent visits to treat obesity as outlined per CMS and USPSTF guidelines and Pennington to schedule her next follow up appointment today.  I, Tammy Wysor, am acting as Location manager for [employer] I, Abby Potash, PA-C have reviewed above note and agree with its content

## 2018-09-01 MED FILL — VIT D2 1.25 MG (50,000 UNIT: 1.25 MG | 28 days supply | Qty: 4 | Fill #0

## 2018-09-03 MED FILL — PANTOPRAZOLE SOD DR 40 MG T: 40 | 30 days supply | Qty: 30 | Fill #1

## 2018-09-03 MED FILL — CITALOPRAM HBR 10 MG TABLET: 10 | 90 days supply | Qty: 90 | Fill #1

## 2018-09-14 ENCOUNTER — Ambulatory Visit (INDEPENDENT_AMBULATORY_CARE_PROVIDER_SITE_OTHER): Payer: 59 | Admitting: Physician Assistant

## 2018-09-24 ENCOUNTER — Ambulatory Visit (INDEPENDENT_AMBULATORY_CARE_PROVIDER_SITE_OTHER): Payer: 59 | Admitting: Physician Assistant

## 2018-09-24 ENCOUNTER — Encounter (INDEPENDENT_AMBULATORY_CARE_PROVIDER_SITE_OTHER): Payer: Self-pay | Admitting: Physician Assistant

## 2018-09-24 VITALS — BP 127/88 | HR 77 | Temp 97.9°F | Ht 66.0 in | Wt 253.0 lb

## 2018-09-24 DIAGNOSIS — E669 Obesity, unspecified: Secondary | ICD-10-CM

## 2018-09-24 DIAGNOSIS — R0602 Shortness of breath: Secondary | ICD-10-CM | POA: Diagnosis not present

## 2018-09-24 DIAGNOSIS — E559 Vitamin D deficiency, unspecified: Secondary | ICD-10-CM | POA: Diagnosis not present

## 2018-09-24 DIAGNOSIS — Z9189 Other specified personal risk factors, not elsewhere classified: Secondary | ICD-10-CM | POA: Diagnosis not present

## 2018-09-24 DIAGNOSIS — R7303 Prediabetes: Secondary | ICD-10-CM

## 2018-09-24 DIAGNOSIS — Z6841 Body Mass Index (BMI) 40.0 and over, adult: Secondary | ICD-10-CM

## 2018-09-24 DIAGNOSIS — F3289 Other specified depressive episodes: Secondary | ICD-10-CM | POA: Diagnosis not present

## 2018-09-24 DIAGNOSIS — Z6830 Body mass index (BMI) 30.0-30.9, adult: Secondary | ICD-10-CM

## 2018-09-24 MED ORDER — METFORMIN HCL 500 MG PO TABS: 500.0000 mg | ORAL_TABLET | Freq: Two times a day (BID) | ORAL | 0 refills | Status: DC

## 2018-09-24 MED ORDER — BUPROPION HCL ER (SR) 200 MG PO TB12: 200.0000 mg | ORAL_TABLET | Freq: Every day | ORAL | 0 refills | Status: DC

## 2018-09-24 MED ORDER — VITAMIN D (ERGOCALCIFEROL) 1.25 MG (50000 UNIT) PO CAPS: 50000.0000 [IU] | ORAL_CAPSULE | ORAL | 0 refills | Status: DC

## 2018-09-24 MED FILL — VIT D2 1.25 MG (50,000 UNIT: 1.25 MG | 28 days supply | Qty: 4 | Fill #0

## 2018-09-24 MED FILL — metFORMIN HCL 500 MG TABS: 500 | 30 days supply | Qty: 60 | Fill #0

## 2018-09-24 NOTE — Progress Notes (Signed)
Office: [phone]  /  Fax: [phone]   HPI:   Chief Complaint: OBESITY Grace Pennington is here to discuss her progress with her obesity treatment plan. She is on the Category 3 plan and is following her eating plan approximately 100 % of the time. She states she is walking, jumping rape, and resistance for 30 minutes 5 times per week. Grace Pennington is frustrated today with the lack of weight loss. She has been following the plan very closely. She denies hunger.  Her weight is 253 lb (114.8 kg) today and has gained 1 pound since her last visit. She has lost 2 lbs since starting treatment with [place].  Pre-Diabetes Grace Pennington has a diagnosis of pre-diabetes based on her elevated Hgb A1c and was informed this puts her at greater risk of developing diabetes. She denies nausea, vomiting, or diarrhea on metformin. She continues to work on diet and exercise to decrease risk of diabetes. She denies polyphagia or hypoglycemia.  Vitamin D Deficiency Grace Pennington has a diagnosis of vitamin D deficiency. She is currently taking prescription Vit D and denies nausea, vomiting or muscle weakness.  At risk for osteopenia and osteoporosis Grace Pennington is at higher risk of osteopenia and osteoporosis due to vitamin D deficiency.   Shortness of Breath with Exertion Grace Pennington notes increasing shortness of breath with exercising and seems to be worsening over time with weight gain. She notes getting out of breath sooner with activity than she used to. She denies dizziness or chest pain.  ALLERGIES: No Known Allergies  MEDICATIONS: Current Outpatient Medications on File Prior to Visit  Medication Sig Dispense Refill  . citalopram (CELEXA) 10 MG tablet Take 10 mg by mouth daily.  6  . hydrochlorothiazide (HYDRODIURIL) 25 MG tablet Take 25 mg by mouth daily.    . pantoprazole (PROTONIX) 40 MG tablet Take 40 mg by mouth daily.    . ranitidine (ZANTAC) 150 MG capsule Take 150 mg by mouth 2 (two) times daily.     No current  facility-administered medications on file prior to visit.     PAST MEDICAL HISTORY: Past Medical History:  Diagnosis Date  . Hypertension   . Palpitations   . Sleep apnea     PAST SURGICAL HISTORY: Past Surgical History:  Procedure Laterality Date  . TUBAL LIGATION      SOCIAL HISTORY: Social History   Tobacco Use  . Smoking status: Former Smoker    Last attempt to quit: 07/29/1996    Years since quitting: 22.1  . Smokeless tobacco: Never Used  Substance Use Topics  . Alcohol use: No  . Drug use: No    FAMILY HISTORY: Family History  Problem Relation Age of Onset  . Heart disease Mother   . Diabetes Mother   . Hyperlipidemia Mother   . Hypertension Mother   . Kidney failure Mother   . Diabetes Unknown   . Stroke Unknown   . Hypertension Unknown   . Kidney failure Unknown   . Coronary artery disease Unknown   . Sleep apnea Father   . Diabetes Father   . Hyperlipidemia Father   . Hypertension Father     ROS: Review of Systems  Constitutional: Negative for weight loss.  Cardiovascular: Negative for chest pain.  Gastrointestinal: Negative for diarrhea, nausea and vomiting.  Musculoskeletal:       Negative muscle weakness  Neurological: Negative for dizziness.  Endo/Heme/Allergies:       Negative polyphagia Negative hypoglycemia    PHYSICAL EXAM: Blood pressure 127/88, pulse  77, temperature 97.9 F (36.6 C), height 5\' 6"  (1.676 m), weight 253 lb (114.8 kg), last menstrual period 12/22/2017, SpO2 97 %. Body mass index is 40.84 kg/m. Physical Exam Vitals signs reviewed.  Constitutional:      Appearance: Normal appearance. She is obese.  Cardiovascular:     Rate and Rhythm: Normal rate.     Pulses: Normal pulses.  Pulmonary:     Effort: Pulmonary effort is normal.     Breath sounds: Normal breath sounds.  Musculoskeletal: Normal range of motion.  Skin:    General: Skin is warm and dry.  Neurological:     Mental Status: She is alert and oriented  to person, place, and time.  Psychiatric:        Mood and Affect: Mood normal.        Behavior: Behavior normal.     RECENT LABS AND TESTS: BMET    Component Value Date/Time   NA 143 08/13/2018 0749   K 4.1 08/13/2018 0749   CL 102 08/13/2018 0749   CO2 25 08/13/2018 0749   GLUCOSE 96 08/13/2018 0749   GLUCOSE 147 (H) 01/03/2017 1624   BUN 19 08/13/2018 0749   CREATININE 0.95 08/13/2018 0749   CALCIUM 9.6 08/13/2018 0749   GFRNONAA 72 08/13/2018 0749   GFRAA 83 08/13/2018 0749   Lab Results  Component Value Date   HGBA1C 6.2 (H) 08/13/2018   HGBA1C 5.9 (H) 02/26/2018   HGBA1C 6.4 (H) 10/07/2017   Lab Results  Component Value Date   INSULIN 26.3 (H) 08/13/2018   INSULIN 16.9 02/26/2018   INSULIN 14.3 10/07/2017   CBC    Component Value Date/Time   WBC 5.9 10/07/2017 1200   WBC 7.4 01/03/2017 1624   RBC 4.73 10/07/2017 1200   RBC 4.54 01/03/2017 1624   HGB 12.6 10/07/2017 1200   HCT 39.4 10/07/2017 1200   PLT 319 01/03/2017 1624   MCV 83 10/07/2017 1200   MCH 26.6 10/07/2017 1200   MCH 25.1 (L) 01/03/2017 1624   MCHC 32.0 10/07/2017 1200   MCHC 32.4 01/03/2017 1624   RDW 16.6 (H) 10/07/2017 1200   LYMPHSABS 2.8 10/07/2017 1200   MONOABS 0.9 07/22/2013 1515   EOSABS 0.2 10/07/2017 1200   BASOSABS 0.0 10/07/2017 1200   Iron/TIBC/Ferritin/ %Sat No results found for: IRON, TIBC, FERRITIN, IRONPCTSAT Lipid Panel     Component Value Date/Time   CHOL 192 08/13/2018 0749   TRIG 43 08/13/2018 0749   HDL 52 08/13/2018 0749   LDLCALC 131 (H) 08/13/2018 0749   Hepatic Function Panel     Component Value Date/Time   PROT 7.3 08/13/2018 0749   ALBUMIN 4.2 08/13/2018 0749   AST 20 08/13/2018 0749   ALT 28 08/13/2018 0749   ALKPHOS 77 08/13/2018 0749   BILITOT 0.2 08/13/2018 0749      Component Value Date/Time   TSH 1.730 10/07/2017 1200    ASSESSMENT AND PLAN: Prediabetes - Plan: metFORMIN (GLUCOPHAGE) 500 MG tablet  Vitamin D deficiency - Plan:  Vitamin D, Ergocalciferol, (DRISDOL) 1.25 MG (50000 UT) CAPS capsule  Shortness of breath on exertion  Other depression - with emotional eating - Plan: DISCONTINUED: buPROPion (WELLBUTRIN SR) 200 MG 12 hr tablet  At risk for osteoporosis  Class 3 severe obesity with serious comorbidity and body mass index (BMI) of 40.0 to 44.9 in adult, unspecified obesity type ([place])  PLAN:  Pre-Diabetes Floria will continue to work on weight loss, exercise, and decreasing simple carbohydrates in her  diet to help decrease the risk of diabetes. We dicussed metformin including benefits and risks. She was informed that eating too many simple carbohydrates or too many calories at one sitting increases the likelihood of GI side effects. Analena agrees continue taking metformin 500 mg BID #60 and we will refill for 1 month. Thuy agrees to follow up with our clinic in 2 weeks as directed to monitor her progress.  Vitamin D Deficiency Daya was informed that low vitamin D levels contributes to fatigue and are associated with obesity, breast, and colon cancer. Gwendola agrees to continue taking prescription Vit D @50 ,000 IU every week #4 and we will refill for 1 month. She will follow up for routine testing of vitamin D, at least 2-3 times per year. She was informed of the risk of over-replacement of vitamin D and agrees to not increase her dose unless she discusses this with [place] first. Najiyah agrees to follow up with our clinic in 2 weeks.  At risk for osteopenia and osteoporosis Collin was given extended (15 minutes) osteoporosis prevention counseling today. Capria is at risk for osteopenia and osteoporsis due to her vitamin D deficiency. She was encouraged to take her vitamin D and follow her higher calcium diet and increase strengthening exercise to help strengthen her bones and decrease her risk of osteopenia and osteoporosis.  Shortness of Breath with Exertion We will recheck indirect calorimeter today. She has agreed to  work on weight loss and gradually increase exercise to treat her exercise induced shortness of breath. If Emery follows our instructions and loses weight without improvement of her shortness of breath, we will plan to refer to pulmonology. Jeany agrees to this plan.  Obesity Jurline is currently in the action stage of change. As such, her goal is to continue with weight loss efforts She has agreed to change to follow the Category 4 plan Savannaha has been instructed to work up to a goal of 150 minutes of combined cardio and strengthening exercise per week for weight loss and overall health benefits. We discussed the following Behavioral Modification Strategies today: work on meal planning and easy cooking plans and keeping healthy foods in the home   Stella has agreed to follow up with our clinic in 2 weeks. She was informed of the importance of frequent follow up visits to maximize her success with intensive lifestyle modifications for her multiple health conditions.   OBESITY BEHAVIORAL INTERVENTION VISIT  Today's visit was # 17   Starting weight: 255 lbs Starting date: 10/07/17 Today's weight : 253 lbs Today's date: 09/24/2018 Total lbs lost to date: 2    09/24/2018  Height 5\' 6"  (1.676 m)  Weight 253 lb (114.8 kg)  BMI (Calculated) 40.85  BLOOD PRESSURE - SYSTOLIC 419  BLOOD PRESSURE - DIASTOLIC 88   Body Fat % 37.9 %  Total Body Water (lbs) 93 lbs  RMR 2430     ASK: We discussed the diagnosis of obesity with Reino Kent today and Charene agreed to give [place] permission to discuss obesity behavioral modification therapy today.  ASSESS: Husna has the diagnosis of obesity and her BMI today is 40.85 Twanda is in the action stage of change   ADVISE: Cala was educated on the multiple health risks of obesity as well as the benefit of weight loss to improve her health. She was advised of the need for long term treatment and the importance of lifestyle modifications.  AGREE: Multiple  dietary modification options and treatment options were discussed  and  Ivalee agreed to the above obesity treatment plan.  Wilhemena Durie, am acting as transcriptionist for Abby Potash, PA-C I, Abby Potash, PA-C have reviewed above note and agree with its content

## 2018-09-28 MED FILL — HYDROCHLOROTHIAZIDE 25 MG T: 25 | 90 days supply | Qty: 90 | Fill #3

## 2018-09-28 MED FILL — PANTOPRAZOLE SOD DR 40 MG T: 40 | 30 days supply | Qty: 30 | Fill #2 | Status: TO

## 2018-10-12 ENCOUNTER — Encounter (INDEPENDENT_AMBULATORY_CARE_PROVIDER_SITE_OTHER): Payer: Self-pay

## 2018-10-15 ENCOUNTER — Other Ambulatory Visit: Payer: Self-pay

## 2018-10-15 ENCOUNTER — Encounter (INDEPENDENT_AMBULATORY_CARE_PROVIDER_SITE_OTHER): Payer: Self-pay

## 2018-10-15 ENCOUNTER — Ambulatory Visit (INDEPENDENT_AMBULATORY_CARE_PROVIDER_SITE_OTHER): Payer: 59 | Admitting: Physician Assistant

## 2018-10-19 ENCOUNTER — Ambulatory Visit (INDEPENDENT_AMBULATORY_CARE_PROVIDER_SITE_OTHER): Payer: 59 | Admitting: Physician Assistant

## 2018-10-19 ENCOUNTER — Encounter (INDEPENDENT_AMBULATORY_CARE_PROVIDER_SITE_OTHER): Payer: Self-pay

## 2018-10-20 ENCOUNTER — Encounter (INDEPENDENT_AMBULATORY_CARE_PROVIDER_SITE_OTHER): Payer: Self-pay

## 2018-11-10 MED FILL — PANTOPRAZOLE SOD DR 40 MG T: 40 | 90 days supply | Qty: 90 | Fill #0

## 2018-12-27 ENCOUNTER — Other Ambulatory Visit: Payer: Self-pay

## 2018-12-27 ENCOUNTER — Emergency Department (INDEPENDENT_AMBULATORY_CARE_PROVIDER_SITE_OTHER)
Admission: EM | Admit: 2018-12-27 | Discharge: 2018-12-27 | Disposition: A | Discharge status: Home / Self Care | Payer: 59 | Source: Home / Self Care

## 2018-12-27 DIAGNOSIS — L0211 Cutaneous abscess of neck: Secondary | ICD-10-CM

## 2018-12-27 MED ORDER — CLINDAMYCIN HCL 300 MG PO CAPS: 300.0000 mg | ORAL_CAPSULE | Freq: Three times a day (TID) | ORAL | 0 refills | Status: AC

## 2018-12-27 NOTE — ED Provider Notes (Signed)
[facility]    CSN: [id number] Arrival date & time: 12/27/18  1256     History   Chief Complaint Chief Complaint  Patient presents with  . Cyst    RT side of neck    HPI Grace Pennington is a 48 y.o. female.   HPI Grace Pennington is a 48 y.o. female presenting to UC with c/o gradually worsening Right side neck pain with swelling of a cyst for about 1 week. No prior hx of a cyst or bump in the same area prior to 1 week ago. No hx of abscesses in the past. Pain is throbbing and sore, 8/10, worse with head movement or palpation.  She has used alcohol swabs and Tylenol as needed with mild relief but no drainage from the area. Denies fever, chills, n/v/d. Pt is pre-diabetic on metformin.    Past Medical History:  Diagnosis Date  . Hypertension   . Palpitations   . Sleep apnea     Patient Active Problem List   Diagnosis Date Noted  . Precordial pain 04/14/2014  . Essential hypertension, benign 08/03/2013  . Family history of ischemic heart disease 08/03/2013    Past Surgical History:  Procedure Laterality Date  . TUBAL LIGATION      OB History    Gravida  2   Para  2   Term  2   Preterm      AB      Living  2     SAB      TAB      Ectopic      Multiple      Live Births               Home Medications    Prior to Admission medications   Medication Sig Start Date End Date Taking? Authorizing Provider  citalopram (CELEXA) 10 MG tablet Take 10 mg by mouth daily. 12/25/16   [provider]  clindamycin (CLEOCIN) 300 MG capsule Take 1 capsule (300 mg total) by mouth 3 (three) times daily for 7 days. 12/27/18 01/03/19  Noe Gens, PA-C  hydrochlorothiazide (HYDRODIURIL) 25 MG tablet Take 25 mg by mouth daily.    [provider]  metFORMIN (GLUCOPHAGE) 500 MG tablet Take 1 tablet (500 mg total) by mouth 2 (two) times daily with a meal. 09/24/18   Abby Potash, PA-C  pantoprazole (PROTONIX) 40 MG tablet Take 40 mg by mouth  daily.    [provider]  ranitidine (ZANTAC) 150 MG capsule Take 150 mg by mouth 2 (two) times daily.    [provider]  Vitamin D, Ergocalciferol, (DRISDOL) 1.25 MG (50000 UT) CAPS capsule Take 1 capsule (50,000 Units total) by mouth every 7 (seven) days. 09/24/18   Abby Potash, PA-C    Family History Family History  Problem Relation Age of Onset  . Heart disease Mother   . Diabetes Mother   . Hyperlipidemia Mother   . Hypertension Mother   . Kidney failure Mother   . Diabetes Other   . Stroke Other   . Hypertension Other   . Kidney failure Other   . Coronary artery disease Other   . Sleep apnea Father   . Diabetes Father   . Hyperlipidemia Father   . Hypertension Father     Social History Social History   Tobacco Use  . Smoking status: Former Smoker    Last attempt to quit: 07/29/1996    Years since quitting: 22.4  .  Smokeless tobacco: Never Used  Substance Use Topics  . Alcohol use: No  . Drug use: No     Allergies   Patient has no known allergies.   Review of Systems Review of Systems  Constitutional: Negative for chills and fever.  Musculoskeletal: Positive for neck pain. Negative for neck stiffness.  Skin: Negative for wound.     Physical Exam Triage Vital Signs ED Triage Vitals [12/27/18 1308]  Enc Vitals Group     BP 122/88     Pulse Rate 87     Resp 18     Temp 98 F (36.7 C)     Temp Source Oral     SpO2 96 %     Weight      Height      Head Circumference      Peak Flow      Pain Score 8     Pain Loc      Pain Edu?      Excl. in [place]?    No data found.  Updated Vital Signs BP 122/88 (BP Location: Left Arm)   Pulse 87   Temp 98 F (36.7 C) (Oral)   Resp 18   SpO2 96%   Visual Acuity Right Eye Distance:   Left Eye Distance:   Bilateral Distance:    Right Eye Near:   Left Eye Near:    Bilateral Near:     Physical Exam Vitals signs and nursing note reviewed.  Constitutional:      Appearance: Normal  appearance. She is well-developed.  HENT:     Head: Normocephalic and atraumatic.   Neck:     Musculoskeletal: Normal range of motion and neck supple.  Cardiovascular:     Rate and Rhythm: Normal rate.  Pulmonary:     Effort: Pulmonary effort is normal.  Musculoskeletal: Normal range of motion.  Skin:    General: Skin is warm and dry.  Neurological:     Mental Status: She is alert and oriented to person, place, and time.  Psychiatric:        Behavior: Behavior normal.      UC Treatments / Results  Labs (all labs ordered are listed, but only abnormal results are displayed) Labs Reviewed  WOUND CULTURE    EKG None  Radiology No results found.  Procedures Incision and Drainage Date/Time: 12/27/2018 1:35 PM Performed by: Noe Gens, PA-C Authorized by: Noe Gens, PA-C   Consent:    Consent obtained:  Verbal   Consent given by:  Patient   Risks discussed:  Bleeding, incomplete drainage, pain and infection   Alternatives discussed:  No treatment and delayed treatment Location:    Type:  Abscess   Size:  1   Location:  Neck   Neck location:  R posterior (just behind Right ear, neck/head) Pre-procedure details:    Skin preparation:  Betadine Anesthesia (see MAR for exact dosages):    Anesthesia method:  Local infiltration   Local anesthetic:  Lidocaine 2% WITH epi Procedure type:    Complexity:  Simple Procedure details:    Incision types:  Single straight   Incision depth:  Dermal   Scalpel blade:  11   Wound management:  Probed and deloculated   Drainage:  Purulent and bloody   Drainage amount:  Scant   Wound treatment:  Wound left open   Packing materials:  None Post-procedure details:    Patient tolerance of procedure:  Tolerated well, no immediate complications   (  including critical care time)  Medications Ordered in UC Medications - No data to display  Initial Impression / Assessment and Plan / UC Course  I have reviewed the triage  vital signs and the nursing notes.  Pertinent labs & imaging results that were available during my care of the patient were reviewed by me and considered in my medical decision making (see chart for details).     Hx and exam c/w abscess on Right side of neck/head.  I&D performed as noted above Wound culture sent Will start pt on Clindamycin while culture pending. Pt declined prescription pain medication at this time. Home care info provided.  Final Clinical Impressions(s) / UC Diagnoses   Final diagnoses:  Abscess of skin of neck     Discharge Instructions      You may want to keep the wound covered with a bandage over the next few days as it will likely continue to bleed and drain as it heals. Keep wound clean with warm water and mild soap. You may apply a warm damp washcloth to the area 2-3 times daily for 10-20 minutes at a time to encourage more drainage. Do not soak the wound in a tub, pool, lake or pond as this increases risk of worsening infection.  Please take antibiotics as prescribed and be sure to complete entire course even if you start to feel better to ensure infection does not come back.  You will be notified of culture results in about 3-4 days. Please follow up with family medicine or return to urgent care if not improving in 4-5 days, sooner if significantly worsening.  You may take 500mg  acetaminophen every 4-6 hours or in combination with ibuprofen 400-600mg  every 6-8 hours as needed for pain, inflammation, and fever.     ED Prescriptions    Medication Sig Dispense Auth. Provider   clindamycin (CLEOCIN) 300 MG capsule Take 1 capsule (300 mg total) by mouth 3 (three) times daily for 7 days. 21 capsule Noe Gens, PA-C     Controlled Substance Prescriptions [place] Controlled Substance Registry consulted? Not Applicable   Tyrell Antonio 12/27/18 1338

## 2018-12-27 NOTE — Discharge Instructions (Signed)
°  You may want to keep the wound covered with a bandage over the next few days as it will likely continue to bleed and drain as it heals. Keep wound clean with warm water and mild soap. You may apply a warm damp washcloth to the area 2-3 times daily for 10-20 minutes at a time to encourage more drainage. Do not soak the wound in a tub, pool, lake or pond as this increases risk of worsening infection.  Please take antibiotics as prescribed and be sure to complete entire course even if you start to feel better to ensure infection does not come back.  You will be notified of culture results in about 3-4 days. Please follow up with family medicine or return to urgent care if not improving in 4-5 days, sooner if significantly worsening.  You may take 500mg  acetaminophen every 4-6 hours or in combination with ibuprofen 400-600mg  every 6-8 hours as needed for pain, inflammation, and fever.

## 2018-12-27 NOTE — ED Triage Notes (Signed)
Pt c/o boil/cyst on RT side of neck x 1 week. Pain worsening and it won't pop. Pain 8/10. Using alcohol swabs and tylenol prn.

## 2018-12-30 ENCOUNTER — Telehealth: Payer: Self-pay | Admitting: Emergency Medicine

## 2018-12-30 LAB — WOUND CULTURE
MICRO NUMBER:: 523145
SPECIMEN QUALITY:: ADEQUATE

## 2018-12-30 NOTE — Telephone Encounter (Signed)
Hope you are getting better, please call back if your are not

## 2019-01-21 ENCOUNTER — Other Ambulatory Visit (INDEPENDENT_AMBULATORY_CARE_PROVIDER_SITE_OTHER): Payer: Self-pay | Admitting: Physician Assistant

## 2019-01-21 DIAGNOSIS — R7303 Prediabetes: Secondary | ICD-10-CM

## 2019-01-21 MED FILL — HYDROCHLOROTHIAZIDE 25 MG T: 25 | 90 days supply | Qty: 90 | Fill #0

## 2019-01-21 MED FILL — VIT D2 1.25 MG (50,000 UNIT: 1.25 MG | 28 days supply | Qty: 4 | Fill #0

## 2019-01-21 MED FILL — CITALOPRAM HBR 10 MG TABLET: 10 | 90 days supply | Qty: 90 | Fill #2

## 2019-01-29 ENCOUNTER — Other Ambulatory Visit (INDEPENDENT_AMBULATORY_CARE_PROVIDER_SITE_OTHER): Payer: Self-pay | Admitting: Physician Assistant

## 2019-01-29 DIAGNOSIS — R7303 Prediabetes: Secondary | ICD-10-CM

## 2019-02-01 ENCOUNTER — Other Ambulatory Visit (INDEPENDENT_AMBULATORY_CARE_PROVIDER_SITE_OTHER): Payer: Self-pay | Admitting: Physician Assistant

## 2019-02-01 DIAGNOSIS — R7303 Prediabetes: Secondary | ICD-10-CM

## 2019-02-19 MED FILL — PANTOPRAZOLE SOD DR 40 MG T: 40 | 90 days supply | Qty: 90 | Fill #0

## 2019-04-22 DIAGNOSIS — Z13220 Encounter for screening for lipoid disorders: Secondary | ICD-10-CM | POA: Diagnosis not present

## 2019-04-22 DIAGNOSIS — Z23 Encounter for immunization: Secondary | ICD-10-CM | POA: Diagnosis not present

## 2019-04-22 DIAGNOSIS — Z0000 Encounter for general adult medical examination without abnormal findings: Secondary | ICD-10-CM | POA: Diagnosis not present

## 2019-04-22 DIAGNOSIS — F410 Panic disorder [episodic paroxysmal anxiety] without agoraphobia: Secondary | ICD-10-CM | POA: Diagnosis not present

## 2019-04-22 DIAGNOSIS — R7301 Impaired fasting glucose: Secondary | ICD-10-CM | POA: Diagnosis not present

## 2019-04-26 MED FILL — HYDROCHLOROTHIAZIDE 25 MG T: 25 | 90 days supply | Qty: 90 | Fill #1

## 2019-05-26 DIAGNOSIS — Z1211 Encounter for screening for malignant neoplasm of colon: Secondary | ICD-10-CM | POA: Diagnosis not present

## 2019-06-02 MED FILL — CITALOPRAM HBR 10 MG TABLET: 10 | 90 days supply | Qty: 90 | Fill #0

## 2019-06-02 MED FILL — PANTOPRAZOLE SOD DR 40 MG T: 40 | 90 days supply | Qty: 90 | Fill #1

## 2019-06-11 DIAGNOSIS — Z1151 Encounter for screening for human papillomavirus (HPV): Secondary | ICD-10-CM | POA: Diagnosis not present

## 2019-06-11 DIAGNOSIS — Z6841 Body Mass Index (BMI) 40.0 and over, adult: Secondary | ICD-10-CM | POA: Diagnosis not present

## 2019-06-11 DIAGNOSIS — Z01419 Encounter for gynecological examination (general) (routine) without abnormal findings: Secondary | ICD-10-CM | POA: Diagnosis not present

## 2019-06-11 DIAGNOSIS — Z1231 Encounter for screening mammogram for malignant neoplasm of breast: Secondary | ICD-10-CM | POA: Diagnosis not present

## 2019-07-06 MED FILL — PEG-3350/ELECTROLYTES 236 G: 236 | 1 days supply | Qty: 4000 | Fill #0

## 2019-07-07 DIAGNOSIS — Z1159 Encounter for screening for other viral diseases: Secondary | ICD-10-CM | POA: Diagnosis not present

## 2019-07-12 DIAGNOSIS — Z1211 Encounter for screening for malignant neoplasm of colon: Secondary | ICD-10-CM | POA: Diagnosis not present

## 2019-07-12 DIAGNOSIS — K635 Polyp of colon: Secondary | ICD-10-CM | POA: Diagnosis not present

## 2019-07-12 DIAGNOSIS — K5730 Diverticulosis of large intestine without perforation or abscess without bleeding: Secondary | ICD-10-CM | POA: Diagnosis not present

## 2019-07-12 DIAGNOSIS — K640 First degree hemorrhoids: Secondary | ICD-10-CM | POA: Diagnosis not present

## 2019-07-13 ENCOUNTER — Institutional Professional Consult (permissible substitution): Payer: 59 | Admitting: Plastic Surgery

## 2019-08-17 ENCOUNTER — Institutional Professional Consult (permissible substitution): Payer: 59 | Admitting: Plastic Surgery

## 2019-08-30 MED FILL — HYDROCHLOROTHIAZIDE 25 MG T: 25 | 90 days supply | Qty: 90 | Fill #2

## 2019-08-30 MED FILL — CITALOPRAM HBR 10 MG TABLET: 10 | 90 days supply | Qty: 90 | Fill #1

## 2019-09-08 MED FILL — PANTOPRAZOLE SOD DR 40 MG T: 40 | 30 days supply | Qty: 30 | Fill #0

## 2019-09-10 MED FILL — ALPRAZolam 0.25 MG TABS: 0.25 | 30 days supply | Qty: 30 | Fill #0

## 2019-10-25 MED FILL — PANTOPRAZOLE SOD DR 40 MG T: 40 | 30 days supply | Qty: 30 | Fill #1

## 2019-11-26 ENCOUNTER — Other Ambulatory Visit (HOSPITAL_COMMUNITY): Payer: Self-pay | Admitting: Gastroenterology

## 2019-11-26 DIAGNOSIS — K219 Gastro-esophageal reflux disease without esophagitis: Secondary | ICD-10-CM | POA: Diagnosis not present

## 2019-11-26 MED FILL — PANTOPRAZOLE SOD DR 40 MG T: 40 | 90 days supply | Qty: 90 | Fill #0

## 2020-02-28 DIAGNOSIS — Z20822 Contact with and (suspected) exposure to covid-19: Secondary | ICD-10-CM | POA: Diagnosis not present

## 2020-03-14 MED FILL — PANTOPRAZOLE SOD DR 40 MG T: 40 | 90 days supply | Qty: 90 | Fill #1

## 2020-03-14 MED FILL — HYDROCHLOROTHIAZIDE 25 MG T: 25 | 90 days supply | Qty: 90 | Fill #0

## 2020-03-20 MED FILL — CITALOPRAM HBR 10 MG TABLET: 10 | 90 days supply | Qty: 90 | Fill #0

## 2020-04-08 ENCOUNTER — Emergency Department (INDEPENDENT_AMBULATORY_CARE_PROVIDER_SITE_OTHER)
Admission: RE | Admit: 2020-04-08 | Discharge: 2020-04-08 | Disposition: A | Discharge status: Home / Self Care | Payer: 59 | Source: Ambulatory Visit

## 2020-04-08 ENCOUNTER — Other Ambulatory Visit: Payer: Self-pay

## 2020-04-08 VITALS — BP 114/81 | HR 76 | Temp 98.0°F | Resp 16

## 2020-04-08 DIAGNOSIS — S39012A Strain of muscle, fascia and tendon of lower back, initial encounter: Secondary | ICD-10-CM | POA: Diagnosis not present

## 2020-04-08 MED ORDER — CYCLOBENZAPRINE HCL 10 MG PO TABS: 10.0000 mg | ORAL_TABLET | Freq: Every evening | ORAL | 0 refills | Status: DC | PRN

## 2020-04-08 NOTE — Discharge Instructions (Signed)
Continue the ibuprofen as needed
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
## 2020-06-12 ENCOUNTER — Other Ambulatory Visit (HOSPITAL_COMMUNITY): Payer: Self-pay | Admitting: Internal Medicine

## 2020-06-12 DIAGNOSIS — S3421XA Injury of nerve root of lumbar spine, initial encounter: Secondary | ICD-10-CM | POA: Diagnosis not present

## 2020-06-12 DIAGNOSIS — S242XXA Injury of nerve root of thoracic spine, initial encounter: Secondary | ICD-10-CM | POA: Diagnosis not present

## 2020-06-12 DIAGNOSIS — S142XXA Injury of nerve root of cervical spine, initial encounter: Secondary | ICD-10-CM | POA: Diagnosis not present

## 2020-06-12 DIAGNOSIS — S3422XA Injury of nerve root of sacral spine, initial encounter: Secondary | ICD-10-CM | POA: Diagnosis not present

## 2020-06-12 DIAGNOSIS — S233XXA Sprain of ligaments of thoracic spine, initial encounter: Secondary | ICD-10-CM | POA: Diagnosis not present

## 2020-06-12 DIAGNOSIS — M62830 Muscle spasm of back: Secondary | ICD-10-CM | POA: Diagnosis not present

## 2020-06-12 DIAGNOSIS — S335XXA Sprain of ligaments of lumbar spine, initial encounter: Secondary | ICD-10-CM | POA: Diagnosis not present

## 2020-06-12 DIAGNOSIS — S134XXA Sprain of ligaments of cervical spine, initial encounter: Secondary | ICD-10-CM | POA: Diagnosis not present

## 2020-06-12 MED FILL — PANTOPRAZOLE SOD DR 40 MG T: 40 | 90 days supply | Qty: 90 | Fill #2

## 2020-06-12 MED FILL — CITALOPRAM HBR 10 MG TABLET: 10 | 90 days supply | Qty: 90 | Fill #0

## 2020-06-12 MED FILL — HYDROCHLOROTHIAZIDE 25 MG T: 25 | 90 days supply | Qty: 90 | Fill #0

## 2020-06-14 DIAGNOSIS — S142XXA Injury of nerve root of cervical spine, initial encounter: Secondary | ICD-10-CM | POA: Diagnosis not present

## 2020-06-14 DIAGNOSIS — S3421XA Injury of nerve root of lumbar spine, initial encounter: Secondary | ICD-10-CM | POA: Diagnosis not present

## 2020-06-14 DIAGNOSIS — M62830 Muscle spasm of back: Secondary | ICD-10-CM | POA: Diagnosis not present

## 2020-06-14 DIAGNOSIS — S242XXA Injury of nerve root of thoracic spine, initial encounter: Secondary | ICD-10-CM | POA: Diagnosis not present

## 2020-06-14 DIAGNOSIS — S335XXA Sprain of ligaments of lumbar spine, initial encounter: Secondary | ICD-10-CM | POA: Diagnosis not present

## 2020-06-14 DIAGNOSIS — S3422XA Injury of nerve root of sacral spine, initial encounter: Secondary | ICD-10-CM | POA: Diagnosis not present

## 2020-06-14 DIAGNOSIS — S134XXA Sprain of ligaments of cervical spine, initial encounter: Secondary | ICD-10-CM | POA: Diagnosis not present

## 2020-06-14 DIAGNOSIS — S233XXA Sprain of ligaments of thoracic spine, initial encounter: Secondary | ICD-10-CM | POA: Diagnosis not present

## 2020-06-19 DIAGNOSIS — S335XXA Sprain of ligaments of lumbar spine, initial encounter: Secondary | ICD-10-CM | POA: Diagnosis not present

## 2020-06-19 DIAGNOSIS — S134XXA Sprain of ligaments of cervical spine, initial encounter: Secondary | ICD-10-CM | POA: Diagnosis not present

## 2020-06-19 DIAGNOSIS — S3421XA Injury of nerve root of lumbar spine, initial encounter: Secondary | ICD-10-CM | POA: Diagnosis not present

## 2020-06-19 DIAGNOSIS — S242XXA Injury of nerve root of thoracic spine, initial encounter: Secondary | ICD-10-CM | POA: Diagnosis not present

## 2020-06-19 DIAGNOSIS — S3422XA Injury of nerve root of sacral spine, initial encounter: Secondary | ICD-10-CM | POA: Diagnosis not present

## 2020-06-19 DIAGNOSIS — S233XXA Sprain of ligaments of thoracic spine, initial encounter: Secondary | ICD-10-CM | POA: Diagnosis not present

## 2020-06-19 DIAGNOSIS — M62830 Muscle spasm of back: Secondary | ICD-10-CM | POA: Diagnosis not present

## 2020-06-19 DIAGNOSIS — S142XXA Injury of nerve root of cervical spine, initial encounter: Secondary | ICD-10-CM | POA: Diagnosis not present

## 2020-06-20 DIAGNOSIS — M62830 Muscle spasm of back: Secondary | ICD-10-CM | POA: Diagnosis not present

## 2020-06-20 DIAGNOSIS — S3421XA Injury of nerve root of lumbar spine, initial encounter: Secondary | ICD-10-CM | POA: Diagnosis not present

## 2020-06-20 DIAGNOSIS — S3422XA Injury of nerve root of sacral spine, initial encounter: Secondary | ICD-10-CM | POA: Diagnosis not present

## 2020-06-20 DIAGNOSIS — S233XXA Sprain of ligaments of thoracic spine, initial encounter: Secondary | ICD-10-CM | POA: Diagnosis not present

## 2020-06-20 DIAGNOSIS — S242XXA Injury of nerve root of thoracic spine, initial encounter: Secondary | ICD-10-CM | POA: Diagnosis not present

## 2020-06-20 DIAGNOSIS — S335XXA Sprain of ligaments of lumbar spine, initial encounter: Secondary | ICD-10-CM | POA: Diagnosis not present

## 2020-06-20 DIAGNOSIS — S142XXA Injury of nerve root of cervical spine, initial encounter: Secondary | ICD-10-CM | POA: Diagnosis not present

## 2020-06-20 DIAGNOSIS — S134XXA Sprain of ligaments of cervical spine, initial encounter: Secondary | ICD-10-CM | POA: Diagnosis not present

## 2020-06-29 DIAGNOSIS — R7301 Impaired fasting glucose: Secondary | ICD-10-CM | POA: Diagnosis not present

## 2020-06-29 DIAGNOSIS — Z13220 Encounter for screening for lipoid disorders: Secondary | ICD-10-CM | POA: Diagnosis not present

## 2020-06-29 DIAGNOSIS — Z0000 Encounter for general adult medical examination without abnormal findings: Secondary | ICD-10-CM | POA: Diagnosis not present

## 2020-06-29 DIAGNOSIS — I10 Essential (primary) hypertension: Secondary | ICD-10-CM | POA: Diagnosis not present

## 2020-06-29 DIAGNOSIS — F419 Anxiety disorder, unspecified: Secondary | ICD-10-CM | POA: Diagnosis not present

## 2020-06-29 DIAGNOSIS — E041 Nontoxic single thyroid nodule: Secondary | ICD-10-CM | POA: Diagnosis not present

## 2020-06-29 DIAGNOSIS — G4730 Sleep apnea, unspecified: Secondary | ICD-10-CM | POA: Diagnosis not present

## 2020-06-30 ENCOUNTER — Other Ambulatory Visit: Payer: Self-pay | Admitting: Internal Medicine

## 2020-06-30 DIAGNOSIS — E041 Nontoxic single thyroid nodule: Secondary | ICD-10-CM

## 2020-07-05 ENCOUNTER — Other Ambulatory Visit (HOSPITAL_COMMUNITY): Payer: Self-pay | Admitting: Internal Medicine

## 2020-07-05 MED FILL — ROSUVASTATIN CALCIUM 10 MG: 10 | 90 days supply | Qty: 90 | Fill #0

## 2020-07-13 ENCOUNTER — Ambulatory Visit
Admission: RE | Admit: 2020-07-13 | Discharge: 2020-07-13 | Disposition: A | Discharge status: Home / Self Care | Payer: 59 | Source: Ambulatory Visit | Attending: Internal Medicine | Admitting: Internal Medicine

## 2020-07-13 DIAGNOSIS — E041 Nontoxic single thyroid nodule: Secondary | ICD-10-CM | POA: Diagnosis not present

## 2020-07-15 ENCOUNTER — Ambulatory Visit: Payer: 59 | Attending: Internal Medicine

## 2020-07-15 DIAGNOSIS — Z23 Encounter for immunization: Secondary | ICD-10-CM

## 2020-07-17 ENCOUNTER — Other Ambulatory Visit (HOSPITAL_COMMUNITY): Payer: Self-pay | Admitting: Internal Medicine

## 2020-07-17 MED FILL — ALPRAZolam 0.25 MG TABS: 0.25 | 30 days supply | Qty: 30 | Fill #0

## 2020-07-18 DIAGNOSIS — Z0100 Encounter for examination of eyes and vision without abnormal findings: Secondary | ICD-10-CM | POA: Diagnosis not present

## 2020-07-18 DIAGNOSIS — H524 Presbyopia: Secondary | ICD-10-CM | POA: Diagnosis not present

## 2020-08-23 ENCOUNTER — Other Ambulatory Visit (HOSPITAL_COMMUNITY): Payer: Self-pay | Admitting: Obstetrics and Gynecology

## 2020-08-23 MED FILL — SPIRONOLACTONE 50 MG TABLET: 50 | 30 days supply | Qty: 60 | Fill #0

## 2020-08-28 ENCOUNTER — Other Ambulatory Visit: Payer: Self-pay | Admitting: Obstetrics and Gynecology

## 2020-08-28 DIAGNOSIS — R928 Other abnormal and inconclusive findings on diagnostic imaging of breast: Secondary | ICD-10-CM

## 2020-09-07 ENCOUNTER — Other Ambulatory Visit: Payer: Self-pay

## 2020-09-07 ENCOUNTER — Ambulatory Visit
Admission: RE | Admit: 2020-09-07 | Discharge: 2020-09-07 | Disposition: A | Discharge status: Home / Self Care | Payer: 59 | Source: Ambulatory Visit | Attending: Obstetrics and Gynecology | Admitting: Obstetrics and Gynecology

## 2020-09-07 ENCOUNTER — Other Ambulatory Visit: Payer: Self-pay | Admitting: Obstetrics and Gynecology

## 2020-09-07 DIAGNOSIS — R928 Other abnormal and inconclusive findings on diagnostic imaging of breast: Secondary | ICD-10-CM

## 2020-09-08 ENCOUNTER — Other Ambulatory Visit: Payer: Self-pay | Admitting: Diagnostic Radiology

## 2020-09-08 ENCOUNTER — Ambulatory Visit
Admission: RE | Admit: 2020-09-08 | Discharge: 2020-09-08 | Disposition: A | Discharge status: Home / Self Care | Payer: No Typology Code available for payment source | Source: Ambulatory Visit | Attending: Obstetrics and Gynecology | Admitting: Obstetrics and Gynecology

## 2020-09-08 DIAGNOSIS — R928 Other abnormal and inconclusive findings on diagnostic imaging of breast: Secondary | ICD-10-CM

## 2020-09-12 ENCOUNTER — Other Ambulatory Visit: Payer: No Typology Code available for payment source

## 2020-09-16 ENCOUNTER — Other Ambulatory Visit: Payer: Self-pay

## 2020-09-16 ENCOUNTER — Encounter: Payer: Self-pay | Admitting: Emergency Medicine

## 2020-09-16 ENCOUNTER — Emergency Department (INDEPENDENT_AMBULATORY_CARE_PROVIDER_SITE_OTHER)
Admission: EM | Admit: 2020-09-16 | Discharge: 2020-09-16 | Disposition: A | Discharge status: Home / Self Care | Payer: No Typology Code available for payment source | Source: Home / Self Care

## 2020-09-16 DIAGNOSIS — I889 Nonspecific lymphadenitis, unspecified: Secondary | ICD-10-CM

## 2020-09-16 MED ORDER — PREDNISONE 20 MG PO TABS: 20.0000 mg | ORAL_TABLET | Freq: Every day | ORAL | 0 refills | Status: AC

## 2020-09-16 MED ORDER — CEPHALEXIN 750 MG PO CAPS: 750.0000 mg | ORAL_CAPSULE | Freq: Three times a day (TID) | ORAL | 0 refills | Status: AC

## 2020-09-16 NOTE — ED Provider Notes (Signed)
[facility]    CSN: [id number] Arrival date & time: 09/16/20  1343      History   Chief Complaint Chief Complaint  Patient presents with  . Facial Swelling    HPI Grace Pennington is a 50 y.o. female.   HPI  Patient presents today for evaluation of pre and postauricular adenopathy which has now become painful.  She denies any recent illness.  No chills or no fever. She is also not experiencing any inner ear pain or sinus symptoms.  She has taken over-the-counter medication for pain relief without any improvement.  Symptoms have been present x2 days and swelling has worsened compared to the onset of symptoms.  Past Medical History:  Diagnosis Date  . Hypertension   . Palpitations   . Sleep apnea     Patient Active Problem List   Diagnosis Date Noted  . Precordial pain 04/14/2014  . Essential hypertension, benign 08/03/2013  . Family history of ischemic heart disease 08/03/2013    Past Surgical History:  Procedure Laterality Date  . TUBAL LIGATION      OB History    Gravida  2   Para  2   Term  2   Preterm      AB      Living  2     SAB      IAB      Ectopic      Multiple      Live Births               Home Medications    Prior to Admission medications   Medication Sig Start Date End Date Taking? Authorizing Provider  ALPRAZolam Duanne Moron) 0.25 MG tablet 1 tablet 07/17/20  Yes [provider]  citalopram (CELEXA) 10 MG tablet Take 10 mg by mouth daily. 12/25/16  Yes [provider]  fluticasone (FLONASE) 50 MCG/ACT nasal spray 2 spray in each nostril 01/08/16  Yes [provider]  hydrochlorothiazide (HYDRODIURIL) 25 MG tablet Take 25 mg by mouth daily.   Yes [provider]  loratadine (CLARITIN) 10 MG tablet 1 tablet 01/08/16  Yes [provider]  pantoprazole (PROTONIX) 40 MG tablet Take 40 mg by mouth daily.   Yes [provider]  rosuvastatin (CRESTOR) 10 MG tablet 1 tablet  07/05/20  Yes [provider]  spironolactone (ALDACTONE) 50 MG tablet 1 tablet 08/25/20  Yes [provider]  valACYclovir (VALTREX) 1000 MG tablet Take by mouth. 01/17/17  Yes [provider]  cyclobenzaprine (FLEXERIL) 10 MG tablet Take 1 tablet (10 mg total) by mouth at bedtime as needed and may repeat dose one time if needed for muscle spasms. 04/08/20   Robyn Haber, MD  metFORMIN (GLUCOPHAGE) 500 MG tablet Take 1 tablet (500 mg total) by mouth 2 (two) times daily with a meal. 09/24/18   Abby Potash, PA-C  ranitidine (ZANTAC) 150 MG capsule Take 150 mg by mouth 2 (two) times daily.    [provider]  Vitamin D, Ergocalciferol, (DRISDOL) 1.25 MG (50000 UT) CAPS capsule Take 1 capsule (50,000 Units total) by mouth every 7 (seven) days. 09/24/18   Abby Potash, PA-C    Family History Family History  Problem Relation Age of Onset  . Heart disease Mother   . Diabetes Mother   . Hyperlipidemia Mother   . Hypertension Mother   . Kidney failure Mother   . Diabetes Other   . Stroke Other   . Hypertension Other   .  Kidney failure Other   . Coronary artery disease Other   . Sleep apnea Father   . Diabetes Father   . Hyperlipidemia Father   . Hypertension Father     Social History Social History   Tobacco Use  . Smoking status: Former Smoker    Quit date: 07/29/1996    Years since quitting: 24.1  . Smokeless tobacco: Never Used  Vaping Use  . Vaping Use: Never used  Substance Use Topics  . Alcohol use: Yes    Comment: socially  . Drug use: No     Allergies   Patient has no known allergies.   Review of Systems Review of Systems Pertinent negatives listed in HPI   Physical Exam Triage Vital Signs ED Triage Vitals  Enc Vitals Group     BP 09/16/20 1443 127/85     Pulse Rate 09/16/20 1443 87     Resp --      Temp 09/16/20 1443 98.5 F (36.9 C)     Temp Source 09/16/20 1443 Oral     SpO2 09/16/20 1443 97 %     Weight --       Height --      Head Circumference --      Peak Flow --      Pain Score 09/16/20 1445 5     Pain Loc --      Pain Edu? --      Excl. in [place]? --    No data found.  Updated Vital Signs BP 127/85 (BP Location: Left Arm)   Pulse 87   Temp 98.5 F (36.9 C) (Oral)   LMP 01/13/2018   SpO2 97%   Visual Acuity Right Eye Distance:   Left Eye Distance:   Bilateral Distance:    Right Eye Near:   Left Eye Near:    Bilateral Near:     Physical Exam HENT:     Head: Normocephalic.   Cardiovascular:     Rate and Rhythm: Normal rate and regular rhythm.  Pulmonary:     Effort: Pulmonary effort is normal.     Breath sounds: Normal breath sounds and air entry.  Neurological:     Mental Status: She is alert.  Psychiatric:        Attention and Perception: Attention normal.        Speech: Speech normal.      UC Treatments / Results  Labs (all labs ordered are listed, but only abnormal results are displayed) Labs Reviewed - No data to display  EKG   Radiology No results found.  Procedures Procedures (including critical care time)  Medications Ordered in UC Medications - No data to display  Initial Impression / Assessment and Plan / UC Course  I have reviewed the triage vital signs and the nursing notes.  Pertinent labs & imaging results that were available during my care of the patient were reviewed by me and considered in my medical decision making (see chart for details).    Treated for lymphadenitis related to the free and postauricular glands patient has some prominent swelling and palpable tenderness with increased warmth to touch on the left portion of her face.  Unknown etiology of symptoms therefore we will treat with prednisone and Keflex with strict return precautions if any of her symptoms worsen or do not improve with current treatment. Final Clinical Impressions(s) / UC Diagnoses   Final diagnoses:  Preauricular lymphadenitis  Postauricular lymphadenitis    Discharge Instructions   None  ED Prescriptions    Medication Sig Dispense Auth. Provider   predniSONE (DELTASONE) 20 MG tablet Take 1 tablet (20 mg total) by mouth daily with breakfast for 5 days. 5 tablet Scot Jun, FNP   cephALEXin (KEFLEX) 750 MG capsule Take 1 capsule (750 mg total) by mouth 3 (three) times daily for 7 days. 21 capsule Scot Jun, FNP     PDMP not reviewed this encounter.   Scot Jun, FNP 09/17/20 1007

## 2020-09-16 NOTE — ED Triage Notes (Signed)
Patient c/o left sided jaw swelling and pain.  No injury to the area, no dental issues.  Patient has not taken anything for the pain.

## 2020-09-18 ENCOUNTER — Other Ambulatory Visit (HOSPITAL_COMMUNITY): Payer: Self-pay | Admitting: Internal Medicine

## 2020-09-18 MED FILL — HYDROCHLOROTHIAZIDE 25 MG T: 25 | 90 days supply | Qty: 90 | Fill #0

## 2020-09-18 MED FILL — PANTOPRAZOLE SOD DR 40 MG T: 40 | 90 days supply | Qty: 90 | Fill #3

## 2020-09-18 MED FILL — CITALOPRAM HBR 10 MG TABLET: 10 | 90 days supply | Qty: 90 | Fill #0

## 2020-09-19 ENCOUNTER — Ambulatory Visit: Payer: Self-pay | Admitting: General Surgery

## 2020-09-19 DIAGNOSIS — C50211 Malignant neoplasm of upper-inner quadrant of right female breast: Secondary | ICD-10-CM

## 2020-09-19 DIAGNOSIS — Z170 Estrogen receptor positive status [ER+]: Secondary | ICD-10-CM

## 2020-09-20 ENCOUNTER — Telehealth: Payer: Self-pay

## 2020-09-20 ENCOUNTER — Encounter: Payer: Self-pay | Admitting: Adult Health

## 2020-09-20 ENCOUNTER — Other Ambulatory Visit: Payer: Self-pay | Admitting: General Surgery

## 2020-09-20 DIAGNOSIS — Z170 Estrogen receptor positive status [ER+]: Secondary | ICD-10-CM

## 2020-09-20 DIAGNOSIS — C50211 Malignant neoplasm of upper-inner quadrant of right female breast: Secondary | ICD-10-CM

## 2020-09-20 MED ORDER — FLUCONAZOLE 150 MG PO TABS: 150.0000 mg | ORAL_TABLET | Freq: Every day | ORAL | 0 refills | Status: AC

## 2020-09-20 NOTE — Telephone Encounter (Signed)
Patient called regarding yeast-like symptoms since beginning an antibiotic. Per provider, Diflucan called into pharmacy of choice, pt aware and verbalizes understanding.

## 2020-09-21 ENCOUNTER — Inpatient Hospital Stay
Payer: No Typology Code available for payment source | Attending: Hematology and Oncology | Admitting: Hematology and Oncology

## 2020-09-21 ENCOUNTER — Other Ambulatory Visit: Payer: Self-pay

## 2020-09-21 ENCOUNTER — Encounter: Payer: Self-pay | Admitting: *Deleted

## 2020-09-21 ENCOUNTER — Telehealth: Payer: Self-pay | Admitting: Hematology and Oncology

## 2020-09-21 DIAGNOSIS — Z170 Estrogen receptor positive status [ER+]: Secondary | ICD-10-CM | POA: Insufficient documentation

## 2020-09-21 DIAGNOSIS — G4730 Sleep apnea, unspecified: Secondary | ICD-10-CM | POA: Insufficient documentation

## 2020-09-21 DIAGNOSIS — Z8349 Family history of other endocrine, nutritional and metabolic diseases: Secondary | ICD-10-CM | POA: Insufficient documentation

## 2020-09-21 DIAGNOSIS — Z833 Family history of diabetes mellitus: Secondary | ICD-10-CM | POA: Diagnosis not present

## 2020-09-21 DIAGNOSIS — Z87891 Personal history of nicotine dependence: Secondary | ICD-10-CM | POA: Diagnosis not present

## 2020-09-21 DIAGNOSIS — Z7984 Long term (current) use of oral hypoglycemic drugs: Secondary | ICD-10-CM | POA: Diagnosis not present

## 2020-09-21 DIAGNOSIS — Z79899 Other long term (current) drug therapy: Secondary | ICD-10-CM | POA: Insufficient documentation

## 2020-09-21 DIAGNOSIS — C50211 Malignant neoplasm of upper-inner quadrant of right female breast: Secondary | ICD-10-CM | POA: Diagnosis not present

## 2020-09-21 DIAGNOSIS — I10 Essential (primary) hypertension: Secondary | ICD-10-CM | POA: Diagnosis not present

## 2020-09-21 DIAGNOSIS — Z8249 Family history of ischemic heart disease and other diseases of the circulatory system: Secondary | ICD-10-CM | POA: Diagnosis not present

## 2020-09-21 DIAGNOSIS — Z7952 Long term (current) use of systemic steroids: Secondary | ICD-10-CM | POA: Insufficient documentation

## 2020-09-21 NOTE — Telephone Encounter (Signed)
Scheduled appts per 2/24 los. Gave pt a print out of AVS.

## 2020-09-21 NOTE — Progress Notes (Signed)
[name] NOTE  Patient Care Team: Seward Carol, MD as PCP - General (Internal Medicine)  CHIEF COMPLAINTS/PURPOSE OF CONSULTATION:  Newly diagnosed breast cancer  HISTORY OF PRESENTING ILLNESS:  Grace Pennington 50 y.o. female is here because of recent diagnosis of right breast cancer.  Patient had a routine screening mammogram in the detected right breast abnormality.  Ultrasound revealed a 0.7 cm mass at 1 o'clock position.  She underwent ultrasound-guided biopsy which revealed a grade 2 invasive ductal carcinoma that was ER/PR positive HER-2 positive with a Ki-67 of 30%.  She was seen by surgery who referred her to [place] to discuss adjuvant treatment options.  I reviewed her records extensively and collaborated the history with the patient.  SUMMARY OF ONCOLOGIC HISTORY: Oncology History  Malignant neoplasm of upper-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, estrogen receptor positive ([place])  09/08/2020 Cancer Staging   Staging form: Breast, AJCC 8th Edition - Clinical stage from 09/08/2020: Stage IA (cT1b, cN0, cM0, G2, ER+, PR+, HER2+) - Signed by Gardenia Phlegm, NP on 09/20/2020 Stage prefix: Initial diagnosis   09/08/2020 Initial Diagnosis   Mammogram detected 0.7 cm mass in the right breast 1 o'clock position, right breast biopsy 1: Grade 2 IDC ER 95% PR 95%, Ki-67 30%, HER-2 positive ratio 2.83      MEDICAL HISTORY:  Past Medical History:  Diagnosis Date  . Hypertension   . Palpitations   . Sleep apnea     SURGICAL HISTORY: Past Surgical History:  Procedure Laterality Date  . TUBAL LIGATION      SOCIAL HISTORY: Social History   Socioeconomic History  . Marital status: Single    Spouse name: Not on file  . Number of children: 2  . Years of education: Not on file  . Highest education level: Not on file  Occupational History  . Occupation: medical records    Employer: [employer]  Tobacco Use  . Smoking status: Former Smoker     Quit date: 07/29/1996    Years since quitting: 24.1  . Smokeless tobacco: Never Used  Vaping Use  . Vaping Use: Never used  Substance and Sexual Activity  . Alcohol use: Yes    Comment: socially  . Drug use: No  . Sexual activity: Yes    Birth control/protection: Surgical  Other Topics Concern  . Not on file  Social History Narrative  . Not on file   Social Determinants of Health   Financial Resource Strain: Not on file  Food Insecurity: Not on file  Transportation Needs: Not on file  Physical Activity: Not on file  Stress: Not on file  Social Connections: Not on file  Intimate Partner Violence: Not on file    FAMILY HISTORY: Family History  Problem Relation Age of Onset  . Heart disease Mother   . Diabetes Mother   . Hyperlipidemia Mother   . Hypertension Mother   . Kidney failure Mother   . Diabetes Other   . Stroke Other   . Hypertension Other   . Kidney failure Other   . Coronary artery disease Other   . Sleep apnea Father   . Diabetes Father   . Hyperlipidemia Father   . Hypertension Father     ALLERGIES:  has No Known Allergies.  MEDICATIONS:  Current Outpatient Medications  Medication Sig Dispense Refill  . ALPRAZolam (XANAX) 0.25 MG tablet 1 tablet    . cephALEXin (KEFLEX) 750 MG capsule Take 1 capsule (750 mg total)  by mouth 3 (three) times daily for 7 days. 21 capsule 0  . citalopram (CELEXA) 10 MG tablet Take 10 mg by mouth daily.  6  . cyclobenzaprine (FLEXERIL) 10 MG tablet Take 1 tablet (10 mg total) by mouth at bedtime as needed and may repeat dose one time if needed for muscle spasms. 10 tablet 0  . fluconazole (DIFLUCAN) 150 MG tablet Take 1 tablet (150 mg total) by mouth daily for 2 doses. Take 1 tablet today and the second in 3 days if symptoms not improved 2 tablet 0  . fluticasone (FLONASE) 50 MCG/ACT nasal spray 2 spray in each nostril    . hydrochlorothiazide (HYDRODIURIL) 25 MG tablet Take 25 mg by mouth daily.    Marland Kitchen loratadine (CLARITIN)  10 MG tablet 1 tablet    . metFORMIN (GLUCOPHAGE) 500 MG tablet Take 1 tablet (500 mg total) by mouth 2 (two) times daily with a meal. 60 tablet 0  . pantoprazole (PROTONIX) 40 MG tablet Take 40 mg by mouth daily.    . predniSONE (DELTASONE) 20 MG tablet Take 1 tablet (20 mg total) by mouth daily with breakfast for 5 days. 5 tablet 0  . ranitidine (ZANTAC) 150 MG capsule Take 150 mg by mouth 2 (two) times daily.    . rosuvastatin (CRESTOR) 10 MG tablet 1 tablet    . spironolactone (ALDACTONE) 50 MG tablet 1 tablet    . valACYclovir (VALTREX) 1000 MG tablet Take by mouth.    . Vitamin D, Ergocalciferol, (DRISDOL) 1.25 MG (50000 UT) CAPS capsule Take 1 capsule (50,000 Units total) by mouth every 7 (seven) days. 4 capsule 0   No current facility-administered medications for this visit.    REVIEW OF SYSTEMS:   Constitutional: Denies fevers, chills or abnormal night sweats  Breast:  Denies any palpable lumps or discharge All other systems were reviewed with the patient and are negative.  PHYSICAL EXAMINATION: ECOG PERFORMANCE STATUS: 1 - Symptomatic but completely ambulatory  Vitals:   09/21/20 1430  BP: 114/78  Pulse: 65  Resp: 19  Temp: 98.4 F (36.9 C)  SpO2: 98%   Filed Weights   09/21/20 1430  Weight: 259 lb 11.2 oz (117.8 kg)       LABORATORY DATA:  I have reviewed the data as listed Lab Results  Component Value Date   WBC 5.9 10/07/2017   HGB 12.6 10/07/2017   HCT 39.4 10/07/2017   MCV 83 10/07/2017   PLT 319 01/03/2017   Lab Results  Component Value Date   NA 143 08/13/2018   K 4.1 08/13/2018   CL 102 08/13/2018   CO2 25 08/13/2018    RADIOGRAPHIC STUDIES: I have personally reviewed the radiological reports and agreed with the findings in the report.  ASSESSMENT AND PLAN:  Malignant neoplasm of upper-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, estrogen receptor positive ([place]) 09/08/2020:Mammogram detected 0.7 cm mass in the right breast 1 o'clock position,  right breast biopsy 1: Grade 2 IDC ER 95% PR 95%, Ki-67 30%, HER-2 positive ratio 2.83 T1b N0 stage Ia  Pathology and radiology counseling: Discussed with the patient, the details of pathology including the type of breast cancer,the clinical staging, the significance of ER, PR and HER-2/neu receptors and the implications for treatment. After reviewing the pathology in detail, we proceeded to discuss the different treatment options between surgery, radiation, chemotherapy, antiestrogen therapies.  Treatment plan: 1.  Breast conserving surgery with sentinel lymph node biopsy 2. adjuvant Taxol Herceptin followed by Herceptin  maintenance for 1 year 3.  Adjuvant radiation 4.  Followed by adjuvant antiestrogen therapy  Chemo counseling: I discussed the pros and cons of Taxol and Herceptin treatment.  We discussed the risk of neuropathy, cytopenias, risk of infection, anemia, LFT abnormalities and Herceptin-related cardiac toxicities as well.  She has 2 children ages 75 and 30. I was able to conference of her older son who was able to ask several questions about her treatment plan. 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  Return to clinic after surgery to discuss final pathology report and confirm the adjuvant treatment plan.   All questions were answered. The patient knows to call the clinic with any problems, questions or concerns.    Harriette Ohara, MD 09/21/20

## 2020-09-21 NOTE — Assessment & Plan Note (Signed)
09/08/2020:Mammogram detected 0.7 cm mass in the right breast 1 o'clock position, right breast biopsy 1: Grade 2 IDC ER 95% PR 95%, Ki-67 30%, HER-2 positive ratio 2.83 T1b N0 stage Ia  Pathology and radiology counseling: Discussed with the patient, the details of pathology including the type of breast cancer,the clinical staging, the significance of ER, PR and HER-2/neu receptors and the implications for treatment. After reviewing the pathology in detail, we proceeded to discuss the different treatment options between surgery, radiation, chemotherapy, antiestrogen therapies.  Treatment plan: 1.  Breast conserving surgery with sentinel lymph node biopsy 2. adjuvant Taxol Herceptin followed by Herceptin maintenance for 1 year 3.  Adjuvant radiation 4.  Followed by adjuvant antiestrogen therapy  Chemo counseling: I discussed the pros and cons of Taxol and Herceptin treatment.  We discussed the risk of neuropathy, cytopenias, risk of infection, anemia, LFT abnormalities and Herceptin-related cardiac toxicities as well.  Return to clinic after surgery to discuss final pathology report and confirm the adjuvant treatment plan.

## 2020-09-22 ENCOUNTER — Telehealth: Payer: Self-pay | Admitting: *Deleted

## 2020-09-22 NOTE — Telephone Encounter (Signed)
Spoke to pt concerning navigation resources and provided contact information. Denies questions or concern regarding dx or treatment care plan. Encourage pt to call with needs. Received verbal understanding.

## 2020-09-25 NOTE — Progress Notes (Signed)
New Breast Cancer Diagnosis: Right Breast Cancer UIQ  Did patient present with symptoms (if so, please note symptoms) or screening mammography?:Screening Mass   Ultrasound 09/07/2020: Irregular hypoechoic mass at 1 o'clock 12 cm from nipple measuring 0.7 x 0.5 x 0.6 cm.  This corresponds well with the mass seen in the right breast at mammography.  No lymphadenopathy seen in the right axilla.   Mammogram: 0.7 cm mass in the upper inner posterior right breast.  Location and Extent of disease :right breast. Located at 1 o'clock position, measured  7 mm in greatest dimension. No Adenopathy was identified.Marland Kitchen  Histology per Pathology Report: grade 2, Invasive Ductal Carcinoma  Receptor Status: ER(positive>95%), PR (positive>95%), Her2-neu (positive), Ki-(30%)  Surgeon and surgical plan, if any: Dr. [name] -Right Breast Lumpectomy with radioactive seed and SLN biopsy. 10/16/2020  Medical oncologist, treatment if any:  Dr. Lindi Adie 09/21/2020 -Treatment plan: 1.  Breast conserving surgery with sentinel lymph node biopsy 2. adjuvant Taxol Herceptin followed by Herceptin maintenance for 1 year 3.  Adjuvant radiation 4.  Followed by adjuvant antiestrogen therapy  Lymphedema issues, if any:  No  Pain issues, if any:  No  SAFETY ISSUES: Prior radiation? No Pacemaker/ICD? No Possible current pregnancy? Postmenopausal Is the patient on methotrexate? No  Current Complaints / other details:

## 2020-09-26 ENCOUNTER — Encounter: Payer: Self-pay | Admitting: Radiation Oncology

## 2020-09-26 ENCOUNTER — Ambulatory Visit
Admission: RE | Admit: 2020-09-26 | Discharge: 2020-09-26 | Disposition: A | Discharge status: Home / Self Care | Payer: No Typology Code available for payment source | Source: Ambulatory Visit | Attending: Radiation Oncology | Admitting: Radiation Oncology

## 2020-09-26 ENCOUNTER — Encounter: Payer: Self-pay | Admitting: Licensed Clinical Social Worker

## 2020-09-26 ENCOUNTER — Other Ambulatory Visit: Payer: Self-pay

## 2020-09-26 VITALS — Ht 66.0 in | Wt 259.0 lb

## 2020-09-26 DIAGNOSIS — C50211 Malignant neoplasm of upper-inner quadrant of right female breast: Secondary | ICD-10-CM

## 2020-09-26 DIAGNOSIS — Z170 Estrogen receptor positive status [ER+]: Secondary | ICD-10-CM

## 2020-09-26 NOTE — Progress Notes (Signed)
[name]  Initial Assessment   Grace Pennington is a 50 y.o. year old female contacted by phone. Clinical Social Work was referred by distress screen for assessment of psychosocial needs.    Distress Screen completed: Yes ONCBCN DISTRESS SCREENING 09/26/2020  Screening Type Initial Screening  Distress experienced in past week (1-10) 5  Emotional problem type Nervousness/Anxiety;Adjusting to illness  Other Contact via phone      Family/Social Information:  . Housing Arrangement: patient lives with son. Two sons, ages 27 & 21 years old . Family members/support persons in your life? Family . Transportation concerns: no  . 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 Income source: Employment . Financial concerns: No o Type of concern: None . Food access concerns: no . Services Currently in place:  n/a  Coping/ Adjustment to diagnosis: . Patient understands treatment plan and what happens next? yes . Concerns about diagnosis and/or treatment: General adjustment to diagnosis . Patient reported stressors: Adjusting to my illness . Current coping skills/ strengths: Average or above average intelligence, Capable of independent living, Communication skills, Motivation for treatment/growth and Supportive family/friends    SUMMARY: Current SDOH Barriers:  . No significant SDOH barriers at this time  Clinical Social Work Clinical Goal(s):  Marland Kitchen No clinical SW goals at this time  Interventions: . Discussed common feeling and emotions when being diagnosed with cancer, and the importance of support during treatment . Informed patient of the support team roles and support services at [facility] . Provided CSW contact information and encouraged patient to call with any questions or concerns   Follow Up Plan: Patient will contact CSW with any support or resource needs Patient verbalizes understanding of plan: Yes    Grace Pennington , LCSW

## 2020-09-26 NOTE — Progress Notes (Signed)
Radiation Oncology         [phone] ________________________________  Initial Outpatient Consultation - Conducted via telephone due to current COVID-19 concerns for limiting patient exposure  I spoke with the patient to conduct this consult visit via telephone to spare the patient unnecessary potential exposure in the healthcare setting during the current COVID-19 pandemic. The patient was notified in advance and was offered a [place] meeting to allow for face to face communication but unfortunately reported that they did not have the appropriate resources/technology to support such a visit and instead preferred to proceed with a telephone consult.   Name: Grace Pennington        MRN: [id number]  Date of Service: 09/26/2020 DOB: May 30, 1971  VB:TYOMAY, Grace Moll, MD  Grace Kussmaul, MD     REFERRING PHYSICIAN: [name], MD   DIAGNOSIS: The encounter diagnosis was Malignant neoplasm of upper-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, estrogen receptor positive ([place]).   HISTORY OF PRESENT ILLNESS: Grace Pennington is a 50 y.o. female withj a new diagnosis of right breast cancer.  The patient was found to have an abnormality in the right breast on screening mammogram which prompted further diagnostic work-up.  Her ultrasound of the right breast showed a lesion in the 1 o'clock position measuring 7 mm, no evidence of adenopathy was identified.  She underwent a right breast biopsy on 09/08/2020 which showed grade 2 invasive ductal carcinoma that was triple positive with a Ki-67 of 30%.  She has met with Dr. Lindi Adie who recommends proceeding with breast conserving surgery which is scheduled for 10/16/2020 and adjuvant chemotherapy.  She is contacted by phone to discuss adjuvant radiotherapy at the appropriate time.   PREVIOUS RADIATION THERAPY: No   PAST MEDICAL HISTORY:  Past Medical History:  Diagnosis Date  . Hypertension   . Palpitations   . Sleep apnea        PAST SURGICAL HISTORY: Past  Surgical History:  Procedure Laterality Date  . TUBAL LIGATION       FAMILY HISTORY:  Family History  Problem Relation Age of Onset  . Heart disease Mother   . Diabetes Mother   . Hyperlipidemia Mother   . Hypertension Mother   . Kidney failure Mother   . Diabetes Other   . Stroke Other   . Hypertension Other   . Kidney failure Other   . Coronary artery disease Other   . Sleep apnea Father   . Diabetes Father   . Hyperlipidemia Father   . Hypertension Father      SOCIAL HISTORY:  reports that she quit smoking about 24 years ago. She has never used smokeless tobacco. She reports current alcohol use. She reports that she does not use drugs. The patient is single and lives in [place]. 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    ALLERGIES: Patient has no known allergies.   MEDICATIONS:  Current Outpatient Medications  Medication Sig Dispense Refill  . ALPRAZolam (XANAX) 0.25 MG tablet Take 0.25 mg by mouth at bedtime as needed for anxiety.    . citalopram (CELEXA) 10 MG tablet Take 10 mg by mouth daily.  6  . fluticasone (FLONASE) 50 MCG/ACT nasal spray 2 spray in each nostril    . hydrochlorothiazide (HYDRODIURIL) 25 MG tablet Take 25 mg by mouth daily.    Marland Kitchen loratadine (CLARITIN) 10 MG tablet 1 tablet    .  Multiple Vitamins-Minerals (MULTIVITAMIN WITH MINERALS) tablet Take 1 tablet by mouth daily.    . pantoprazole (PROTONIX) 40 MG tablet Take 40 mg by mouth daily.    . rosuvastatin (CRESTOR) 10 MG tablet 1 tablet    . spironolactone (ALDACTONE) 50 MG tablet 1 tablet    . valACYclovir (VALTREX) 1000 MG tablet Take by mouth.    . cyclobenzaprine (FLEXERIL) 10 MG tablet Take 1 tablet (10 mg total) by mouth at bedtime as needed and may repeat dose one time if needed for muscle spasms. (Patient not taking: Reported on 09/26/2020) 10 tablet 0   No current facility-administered medications for this  encounter.     REVIEW OF SYSTEMS: On review of systems, the patient reports that she is doing well overall. She denies any concerns with pain or other breast complaints.     PHYSICAL EXAM:  Wt Readings from Last 3 Encounters:  09/26/20 259 lb (117.5 kg)  09/21/20 259 lb 11.2 oz (117.8 kg)  09/24/18 253 lb (114.8 kg)   Unable to assess due to encounter type.   ECOG = 0  0 - Asymptomatic (Fully active, able to carry on all predisease activities without restriction)  1 - Symptomatic but completely ambulatory (Restricted in physically strenuous activity but ambulatory and able to carry out work of a light or sedentary nature. For example, light housework, office work)  2 - Symptomatic, <50% in bed during the day (Ambulatory and capable of all self care but unable to carry out any work activities. Up and about more than 50% of waking hours)  3 - Symptomatic, >50% in bed, but not bedbound (Capable of only limited self-care, confined to bed or chair 50% or more of waking hours)  4 - Bedbound (Completely disabled. Cannot carry on any self-care. Totally confined to bed or chair)  5 - Death   Grace Pennington MM, Creech RH, Tormey DC, et al. [phone]). "Toxicity and response criteria of the [facility]". [place] Oncol. 5 (6): 649-55    LABORATORY DATA:  Lab Results  Component Value Date   WBC 5.9 10/07/2017   HGB 12.6 10/07/2017   HCT 39.4 10/07/2017   MCV 83 10/07/2017   PLT 319 01/03/2017   Lab Results  Component Value Date   NA 143 08/13/2018   K 4.1 08/13/2018   CL 102 08/13/2018   CO2 25 08/13/2018   Lab Results  Component Value Date   ALT 28 08/13/2018   AST 20 08/13/2018   ALKPHOS 77 08/13/2018   BILITOT 0.2 08/13/2018      RADIOGRAPHY: US BREAST LTD UNI RIGHT INC AXILLA  Result Date: 09/07/2020 CLINICAL DATA:  Ultrasound only screening recall for a 0.7 cm irregular mass in the upper inner posterior right breast. EXAM: ULTRASOUND OF THE RIGHT  BREAST COMPARISON:  Previous exams. FINDINGS: Targeted ultrasound of the upper inner right breast was performed. There is an irregular hypoechoic mass at 1 o'clock 12 cm from nipple measuring 0.7 x 0.5 x 0.6 cm. This corresponds well with the mass seen in the right breast at mammography. No lymphadenopathy seen in the right axilla. IMPRESSION: Suspicious 0.7 cm mass in the right breast at the 1 o'clock position. RECOMMENDATION: Recommend ultrasound-guided core biopsy of the mass in the right breast at the 1 o'clock position. I have discussed the findings and recommendations with the patient. If applicable, a reminder letter will be sent to the patient regarding the next appointment. BI-RADS CATEGORY  4: Suspicious. Electronically Signed  By: Grace Pennington M.D.   On: 09/07/2020 14:06   MM CLIP PLACEMENT RIGHT  Result Date: 09/08/2020 CLINICAL DATA:  50 year old female status post ultrasound-guided biopsy of the right breast. EXAM: DIAGNOSTIC RIGHT MAMMOGRAM POST ULTRASOUND BIOPSY COMPARISON:  Previous exam(s). FINDINGS: Mammographic images were obtained following ultrasound guided biopsy of the right breast. The biopsy marking clip is displaced approximately 5 mm anterior and medial to the biopsied mass, likely along the biopsy tract. IMPRESSION: Approximately 5 mm anteromedial displacement of the biopsy marking clip relative to the biopsied mass. Final Assessment: Post Procedure Mammograms for Marker Placement Electronically Signed   By: Grace Pennington M.D.   On: 09/08/2020 13:33   [place] RT BREAST BX W LOC DEV 1ST LESION IMG BX SPEC US GUIDE  Addendum Date: 09/11/2020   ADDENDUM REPORT: 09/11/2020 14:21 ADDENDUM: Pathology revealed GRADE II INVASIVE MAMMARY CARCINOMA of the RIGHT breast, 1 o'clock 12cmfn. This was found to be concordant by Dr. Kristopher Pennington. Pathology results were discussed with the patient by telephone. The patient reported doing well after the biopsy with tenderness at the site. Post  biopsy instructions and care were reviewed and questions were answered. The patient was encouraged to call [facility] for any additional concerns. Surgical consultation has been arranged with Dr. Autumn Messing at [facility] on September 19, 2020. Pathology results reported by Grace Acres RN on 09/11/2020. Electronically Signed   By: Grace Pennington M.D.   On: 09/11/2020 14:21   Result Date: 09/11/2020 CLINICAL DATA:  50 year old female with a suspicious right breast. EXAM: ULTRASOUND GUIDED RIGHT BREAST CORE NEEDLE BIOPSY COMPARISON:  Previous exam(s). PROCEDURE: I met with the patient and we discussed the procedure of ultrasound-guided biopsy, including benefits and alternatives. We discussed the high likelihood of a successful procedure. We discussed the risks of the procedure, including infection, bleeding, tissue injury, clip migration, and inadequate sampling. Informed written consent was given. The usual time-out protocol was performed immediately prior to the procedure. Lesion quadrant: Upper inner quadrant Using sterile technique and 1% Lidocaine as local anesthetic, under direct ultrasound visualization, a 12 gauge spring-loaded device was used to perform biopsy of a mass at the 1 o'clock position using a inferior approach. At the conclusion of the procedure a ribbon shaped tissue marker clip was deployed into the biopsy cavity. Follow up 2 view mammogram was performed and dictated separately. IMPRESSION: Ultrasound guided biopsy of the right breast. No apparent complications. Electronically Signed: By: Grace Pennington M.D. On: 09/08/2020 13:19       IMPRESSION/PLAN: 1. Stage IA, [id number] grade 2 invasive carcinoma of the right breast. Grace Pennington discusses the pathology findings and reviews the nature of right breast disease. The consensus from the breast conference includes breast conservation with lumpectomy with and sentinel node biopsy followed by  Chemotherapy  and antiHER2 therapy. The patient's course would then be followed by external radiotherapy to the breast  to reduce risks of local recurrence followed by antiestrogen therapy. We discussed the risks, benefits, short, and long term effects of radiotherapy, as well as the curative intent, and the patient is interested in proceeding at the appropriate time. Grace Pennington discusses the delivery and logistics of radiotherapy and anticipates a course of 6 1/2 weeks of radiotherapy to the right breast. We will see her back a few weeks after surgery to discuss the simulation process and anticipate we starting radiotherapy about 4-6 weeks after surgery.    Given current concerns for patient exposure  during the COVID-19 pandemic, this encounter was conducted via telephone.  The patient has provided two factor identification and has given verbal consent for this type of encounter and has been advised to only accept a meeting of this type in a secure network environment. The time spent during this encounter was 60 minutes including preparation, discussion, and coordination of the patient's care. The attendants for this meeting include Grace Nicely, RN, Grace Pennington, Grace Pennington  and Grace Pennington and her son Grace Pennington.  During the encounter,  Grace Nicely, RN, Grace Pennington, and Grace Pennington were located at [facility] Radiation Oncology Department.  Grace Pennington was located at home and her son Grace Pennington was also available by phone.    The above documentation reflects my direct findings during this shared patient visit. Please see the separate note by Grace Pennington on this date for the remainder of the patient's plan of care.    Grace Pennington, [facility]    **Disclaimer: This note was dictated with voice recognition software. Similar sounding words can inadvertently be transcribed and this note may contain transcription errors which may not have been corrected upon publication of  note.**

## 2020-09-28 ENCOUNTER — Other Ambulatory Visit: Payer: Self-pay

## 2020-09-28 ENCOUNTER — Ambulatory Visit: Payer: No Typology Code available for payment source | Attending: General Surgery | Admitting: Rehabilitation

## 2020-09-28 ENCOUNTER — Encounter: Payer: Self-pay | Admitting: Rehabilitation

## 2020-09-28 DIAGNOSIS — R293 Abnormal posture: Secondary | ICD-10-CM | POA: Diagnosis not present

## 2020-09-28 NOTE — Therapy (Signed)
[place], [place], [address] Phone: [phone]   Fax:  [phone]  Physical Therapy Evaluation  Patient Details  Name: Grace Pennington MRN: [id number] Date of Birth: 05-22-1971 Referring Provider (PT): Joaquin Courts Date: 09/28/2020   PT End of Session - 09/28/20 0928    Visit Number 1    Number of Visits 2    Date for PT Re-Evaluation 11/13/20    PT Start Time 0900    PT Stop Time 0926    PT Time Calculation (min) 26 min    Activity Tolerance Patient tolerated treatment well    Behavior During Therapy [facility] for tasks assessed/performed           Past Medical History:  Diagnosis Date  . Hypertension   . Palpitations   . Sleep apnea     Past Surgical History:  Procedure Laterality Date  . TUBAL LIGATION      There were no vitals filed for this visit.    Subjective Assessment - 09/28/20 0900    Subjective No issues    Pertinent History Will be have right sided lumpectomy and SLNB on 10/16/20 with Dr. [name] due to ER+ breast cancer with chemotherapy and radiation to follow.  HTN    Currently in Pain? No/denies              [address] [facility] PT Assessment - 09/28/20 0001      Assessment   Medical Diagnosis breast cancer    Referring Provider (PT) [name]    Onset Date/Surgical Date 09/28/20    Hand Dominance Right      Precautions   Precaution Comments active cancer      Restrictions   Weight Bearing Restrictions No      Balance Screen   Has the patient fallen in the past 6 months No    Has the patient had a decrease in activity level because of a fear of falling?  No    Is the patient reluctant to leave their home because of a fear of falling?  No      Home Ec[place] residence    Living Arrangements Children      Prior Function   Level of Independence Independent    Vocation Full time employment    Vocation Requirements [place] medical records  will have 4-6 weeks off for surgery.    Leisure none currently      Cognition   Overall Cognitive Status Within Functional Limits for tasks assessed      Posture/Postural Control   Posture/Postural Control Postural limitations    Postural Limitations Rounded Shoulders;Forward head      ROM / Strength   AROM / PROM / Strength AROM      AROM   AROM Assessment Site Shoulder    Right/Left Shoulder Right;Left    Right Shoulder Extension 85 Degrees    Right Shoulder Flexion 167 Degrees    Right Shoulder ABduction 169 Degrees    Right Shoulder Internal Rotation 70 Degrees    Right Shoulder External Rotation 84 Degrees             LYMPHEDEMA/ONCOLOGY QUESTIONNAIRE - 09/28/20 0001      Lymphedema Assessments   Lymphedema Assessments Upper extremities      Right Upper Extremity Lymphedema   15 cm Proximal to Olecranon Process 43.7 cm    Olecranon Process 33 cm    10 cm Proximal to Ulnar  Styloid Process 28.3 cm    Just Proximal to Ulnar Styloid Process 20.6 cm    Across Hand at [employer] 23.2 cm    At [place] of 2nd Digit 7.5 cm      Left Upper Extremity Lymphedema   15 cm Proximal to Olecranon Process 43 cm    Olecranon Process 33 cm    10 cm Proximal to Ulnar Styloid Process 27.4 cm    Just Proximal to Ulnar Styloid Process 21.2 cm    Across Hand at [employer] 23.5 cm    At [place] of 2nd Digit 7.2 cm           L-DEX FLOWSHEETS - 09/28/20 0900      L-DEX LYMPHEDEMA SCREENING   Measurement Type Unilateral    L-DEX MEASUREMENT EXTREMITY Upper Extremity    POSITION  Standing    DOMINANT SIDE Right    At Risk Side Right    BASELINE SCORE (UNILATERAL) 1                  Objective measurements completed on examination: See above findings.               PT Education - 09/28/20 0927    Education Details post op stretches, walking importance, SOZO and lymphedema    Person(s) Educated Patient    Methods Explanation;Demonstration;Verbal  cues;Handout    Comprehension Verbalized understanding             Patient will follow up at outpatient cancer rehab 3-4 weeks following surgery.  If the patient requires physical therapy at that time, a specific plan will be dictated and sent to the referring physician for approval. The patient was educated today on appropriate basic range of motion exercises to begin post operatively and the importance of attending the After Breast Cancer class following surgery.  Patient was educated today on lymphedema risk reduction practices as it pertains to recommendations that will benefit the patient immediately following surgery.  She verbalized good understanding.    The patient was assessed using the L-Dex machine today to produce a lymphedema index baseline score. The patient will be reassessed on a regular basis (typically every 3 months) to obtain new L-Dex scores. If the score is > 6.5 points away from his/her baseline score indicating onset of subclinical lymphedema, it will be recommended to wear a compression garment for 4 weeks, 12 hours per day and then be reassessed. If the score continues to be > 6.5 points from baseline at reassessment, we will initiate lymphedema treatment. Assessing in this manner has a 95% rate of preventing clinically significant lymphedema.    PT Long Term Goals - 09/28/20 0931      PT LONG TERM GOAL #1   Title Pt will return to baseline AROM    Time [facility] Goals - 09/28/20 0931      Patient will be able to verbalize understanding of pertinent lymphedema risk reduction practices relevant to her diagnosis specifically related to skin care.   Status Achieved      Patient will be able to return demonstrate and/or verbalize understanding of the post-op home exercise program related to regaining shoulder range of motion.   Status Achieved      Patient will be able to verbalize understanding of the importance of  attending the postoperative After Breast Cancer Class  for further lymphedema risk reduction education and therapeutic exercise.   Status Achieved                 Plan - 09/28/20 0929    Clinical Impression Statement Pt presents to PT pre right lumpectomy and SLNB to get information about post op care and to establish SOZO and lymphedema surveillance.    Personal Factors and Comorbidities Fitness    Stability/Clinical Decision Making Stable/Uncomplicated    Clinical Decision Making Low    Rehab Potential Excellent    PT Frequency One time visit    PT Duration 8 weeks    PT Treatment/Interventions Therapeutic exercise;Patient/family education;Manual lymph drainage;ADLs/Self Care Home Management    PT Next Visit Plan post op check, more visits? schedule 3 month SOZO, sign up for ABC if no more viists    PT Home Exercise Plan post op    Consulted and Agree with Plan of Care Patient           Patient will benefit from skilled therapeutic intervention in order to improve the following deficits and impairments:  Postural dysfunction  Visit Diagnosis: Abnormal posture     Problem List Patient Active Problem List   Diagnosis Date Noted  . Malignant neoplasm of upper-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, estrogen receptor positive ([place]) 09/20/2020  . Precordial pain 04/14/2014  . Essential hypertension, benign 08/03/2013  . Family history of ischemic heart disease 08/03/2013    Stark Bray 09/28/2020, 9:32 AM  [place], [place], [address] Phone: [phone]   Fax:  [phone]  Name: KEANDRIA BERROCAL MRN: [id number] Date of Birth: [birth year]

## 2020-09-28 NOTE — Patient Instructions (Signed)

## 2020-10-06 MED FILL — ROSUVASTATIN CALCIUM 10 MG: 10 | 90 days supply | Qty: 90 | Fill #1

## 2020-10-10 NOTE — Progress Notes (Signed)
Surgical Instructions   Your procedure is scheduled on Monday, March 21st.  Report to [facility] Main Entrance "A" at 8:00 A.M., then check in with the Admitting office.  Call this number if you have problems the morning of surgery:  [phone]   If you have any questions prior to your surgery date call [phone]: Open Monday-Friday 8am-4pm   Remember:  Do not eat after midnight the night before your surgery  You may drink clear liquids until 7:00 A.M. the morning of your surgery.   Clear liquids allowed are: Water, Non-Citrus Juices (without pulp), Carbonated Beverages, Clear Tea, Black Coffee Only, and Gatorade    Take these medicines the morning of surgery with A SIP OF WATER  citalopram (CELEXA) pantoprazole (PROTONIX)  rosuvastatin (CRESTOR)   If needed: ALPRAZolam Duanne Moron), fluticasone (FLONASE)/nasal spray, Eye drops, loratadine (CLARITIN)      As of today, STOP taking any Aspirin (unless otherwise instructed by your surgeon) Aleve, Naproxen, Ibuprofen, Motrin, Advil, Goody's, BC's, all herbal medications, fish oil, and all vitamins.                     Special instructions:   [facility]- Preparing For Surgery  Before surgery, you can play an important role. Because skin is not sterile, your skin needs to be as free of germs as possible. You can reduce the number of germs on your skin by washing with CHG (chlorahexidine gluconate) Soap before surgery.  CHG is an antiseptic cleaner which kills germs and bonds with the skin to continue killing germs even after washing.    Oral Hygiene is also important to reduce your risk of infection.  Remember - BRUSH YOUR TEETH THE MORNING OF SURGERY WITH YOUR REGULAR TOOTHPASTE  Please do not use if you have an allergy to CHG or antibacterial soaps. If your skin becomes reddened/irritated stop using the CHG.  Do not shave (including legs and underarms) for at least 48 hours prior to first CHG shower. It is OK to shave your  face.  Please follow these instructions carefully.   1. Shower the NIGHT BEFORE SURGERY and the MORNING OF SURGERY  2. If you chose to wash your hair, wash your hair first as usual with your normal shampoo. After you shampoo, rinse your hair and body thoroughly to remove the shampoo.  3. Wash Face and genitals (private parts) with your normal soap.   4. Use CHG Soap as you would any other liquid soap. You can apply CHG directly to the skin and wash gently with a scrungie or a clean washcloth.   5. Apply the CHG Soap to your body ONLY FROM THE NECK DOWN.  Do not use on open wounds or open sores. Avoid contact with your eyes, ears, mouth and genitals (private parts). Wash Face and genitals (private parts)  with your normal soap.   6. Wash thoroughly, paying special attention to the area where your surgery will be performed.  7. Thoroughly rinse your body with warm water from the neck down.  8. DO NOT shower/wash with your normal soap after using and rinsing off the CHG Soap.  9. Pat yourself dry with a CLEAN TOWEL.  10. Wear CLEAN PAJAMAS to bed the night before surgery  11. Place CLEAN SHEETS on your bed the night before your surgery  12. DO NOT SLEEP WITH PETS.  Day of Surgery:           Remember to brush your teeth WITH YOUR REGULAR  TOOTHPASTE.  Shower with CHG soap Wear Clean/Comfortable clothing the morning of surgery    Do not wear jewelry, make up, or nail polish            Do not wear lotions, powders, perfumes, or deodorant.            Do not shave 48 hours prior to surgery.              Do not bring valuables to the hospital.            [facility] is not responsible for any belongings or valuables.  Do NOT Smoke (Tobacco/Vaping) or drink Alcohol 24 hours prior to your procedure If you use a CPAP at night, you may bring all equipment for your overnight stay.   Contacts, glasses, dentures or bridgework may not be worn into surgery, please bring cases for these  belongings   For patients admitted to the hospital, discharge time will be determined by your treatment team.   Patients discharged the day of surgery will not be allowed to drive home, and someone needs to stay with them for 24 hours.   Please read over the following fact sheets that you were given.

## 2020-10-11 ENCOUNTER — Other Ambulatory Visit: Payer: Self-pay

## 2020-10-11 ENCOUNTER — Encounter (HOSPITAL_COMMUNITY)
Admission: RE | Admit: 2020-10-11 | Discharge: 2020-10-11 | Disposition: A | Discharge status: Home / Self Care | Payer: No Typology Code available for payment source | Source: Ambulatory Visit | Attending: General Surgery | Admitting: General Surgery

## 2020-10-11 ENCOUNTER — Encounter (HOSPITAL_COMMUNITY): Payer: Self-pay

## 2020-10-11 DIAGNOSIS — Z01818 Encounter for other preprocedural examination: Secondary | ICD-10-CM | POA: Insufficient documentation

## 2020-10-11 HISTORY — DX: Anxiety disorder, unspecified: F41.9

## 2020-10-11 HISTORY — DX: Malignant neoplasm of unspecified site of unspecified female breast: C50.919

## 2020-10-11 HISTORY — DX: Depression, unspecified: F32.A

## 2020-10-11 HISTORY — DX: Gastro-esophageal reflux disease without esophagitis: K21.9

## 2020-10-11 LAB — CBC
HCT: 44.5 % (ref 36.0–46.0)
Hemoglobin: 14.5 g/dL (ref 12.0–15.0)
MCH: 29.5 pg (ref 26.0–34.0)
MCHC: 32.6 g/dL (ref 30.0–36.0)
MCV: 90.6 fL (ref 80.0–100.0)
Platelets: 244 10*3/uL (ref 150–400)
RBC: 4.91 MIL/uL (ref 3.87–5.11)
RDW: 14.4 % (ref 11.5–15.5)
WBC: 5.6 10*3/uL (ref 4.0–10.5)
nRBC: 0 % (ref 0.0–0.2)

## 2020-10-11 LAB — BASIC METABOLIC PANEL
Anion gap: 9 (ref 5–15)
BUN: 13 mg/dL (ref 6–20)
CO2: 25 mmol/L (ref 22–32)
Calcium: 9.2 mg/dL (ref 8.9–10.3)
Chloride: 104 mmol/L (ref 98–111)
Creatinine, Ser: 1 mg/dL (ref 0.44–1.00)
GFR, Estimated: >60 mL/min (ref >60)
Glucose, Bld: 122 mg/dL — ABNORMAL HIGH (ref 70–99)
Potassium: 3.7 mmol/L (ref 3.5–5.1)
Sodium: 138 mmol/L (ref 135–145)

## 2020-10-11 NOTE — Progress Notes (Signed)
PCP - Dr. Carney Living  Cardiologist - Denies  Chest x-ray - Denies  EKG - 10/11/20  Stress Test - Denies  ECHO - Denies  Cardiac Cath - Denies  AICD-na PM-na LOOP-na  Sleep Study - Yes- Positive CPAP - Yes- will bring day of surgery  LABS- 10/11/20: CBC, BMP 10/12/20: COVID  ASA- Denies  ERAS- Yes- no drink  HA1C- Denies  Anesthesia- Yes- Seed placement 3/18  Pt denies having chest pain, sob, or fever at this time. All instructions explained to the pt, with a verbal understanding of the material. Pt agrees to go over the instructions while at home for a better understanding. Pt also instructed to self quarantine after being tested for COVID-19. The opportunity to ask questions was provided.   Coronavirus Screening  Have you experienced the following symptoms:  Cough yes/no: No Fever (>100.22F)  yes/no: No Runny nose yes/no: No Sore throat yes/no: No Difficulty breathing/shortness of breath  yes/no: No  Have you or a family member traveled in the last 14 days and where? yes/no: No   If the patient indicates "YES" to the above questions, their PAT will be rescheduled to limit the exposure to others and, the surgeon will be notified. THE PATIENT WILL NEED TO BE ASYMPTOMATIC FOR 14 DAYS.   If the patient is not experiencing any of these symptoms, the PAT nurse will instruct them to NOT bring anyone with them to their appointment since they may have these symptoms or traveled as well.   Please remind your patients and families that hospital visitation restrictions are in effect and the importance of the restrictions.

## 2020-10-12 ENCOUNTER — Other Ambulatory Visit (HOSPITAL_COMMUNITY)
Admission: RE | Admit: 2020-10-12 | Discharge: 2020-10-12 | Disposition: A | Discharge status: Home / Self Care | Payer: No Typology Code available for payment source | Source: Ambulatory Visit | Attending: General Surgery | Admitting: General Surgery

## 2020-10-12 DIAGNOSIS — Z20822 Contact with and (suspected) exposure to covid-19: Secondary | ICD-10-CM | POA: Insufficient documentation

## 2020-10-12 DIAGNOSIS — Z01812 Encounter for preprocedural laboratory examination: Secondary | ICD-10-CM | POA: Insufficient documentation

## 2020-10-12 LAB — SARS CORONAVIRUS 2 (TAT 6-24 HRS): SARS Coronavirus 2: NEGATIVE

## 2020-10-13 ENCOUNTER — Ambulatory Visit
Admission: RE | Admit: 2020-10-13 | Discharge: 2020-10-13 | Disposition: A | Discharge status: Home / Self Care | Payer: No Typology Code available for payment source | Source: Ambulatory Visit | Attending: General Surgery | Admitting: General Surgery

## 2020-10-13 ENCOUNTER — Other Ambulatory Visit: Payer: Self-pay

## 2020-10-13 DIAGNOSIS — C50211 Malignant neoplasm of upper-inner quadrant of right female breast: Secondary | ICD-10-CM

## 2020-10-13 DIAGNOSIS — Z170 Estrogen receptor positive status [ER+]: Secondary | ICD-10-CM

## 2020-10-16 ENCOUNTER — Ambulatory Visit (HOSPITAL_COMMUNITY): Payer: No Typology Code available for payment source

## 2020-10-16 ENCOUNTER — Other Ambulatory Visit (HOSPITAL_COMMUNITY): Payer: Self-pay | Admitting: General Surgery

## 2020-10-16 ENCOUNTER — Ambulatory Visit (HOSPITAL_COMMUNITY): Payer: No Typology Code available for payment source | Admitting: Anesthesiology

## 2020-10-16 ENCOUNTER — Ambulatory Visit (HOSPITAL_COMMUNITY)
Admission: RE | Admit: 2020-10-16 | Discharge: 2020-10-16 | Disposition: A | Discharge status: Home / Self Care | Payer: No Typology Code available for payment source | Attending: General Surgery | Admitting: General Surgery

## 2020-10-16 ENCOUNTER — Ambulatory Visit (HOSPITAL_COMMUNITY): Payer: No Typology Code available for payment source | Admitting: Emergency Medicine

## 2020-10-16 ENCOUNTER — Encounter (HOSPITAL_COMMUNITY)
Admission: RE | Disposition: A | Discharge status: Home / Self Care | Payer: Self-pay | Source: Home / Self Care | Attending: General Surgery

## 2020-10-16 ENCOUNTER — Ambulatory Visit (HOSPITAL_COMMUNITY)
Admission: RE | Admit: 2020-10-16 | Discharge: 2020-10-16 | Disposition: A | Discharge status: Home / Self Care | Payer: No Typology Code available for payment source | Source: Ambulatory Visit | Attending: General Surgery | Admitting: General Surgery

## 2020-10-16 ENCOUNTER — Other Ambulatory Visit: Payer: Self-pay

## 2020-10-16 ENCOUNTER — Ambulatory Visit
Admission: RE | Admit: 2020-10-16 | Discharge: 2020-10-16 | Disposition: A | Discharge status: Home / Self Care | Payer: No Typology Code available for payment source | Source: Ambulatory Visit | Attending: General Surgery | Admitting: General Surgery

## 2020-10-16 ENCOUNTER — Encounter (HOSPITAL_COMMUNITY): Payer: Self-pay | Admitting: General Surgery

## 2020-10-16 DIAGNOSIS — Z170 Estrogen receptor positive status [ER+]: Secondary | ICD-10-CM | POA: Diagnosis not present

## 2020-10-16 DIAGNOSIS — C50211 Malignant neoplasm of upper-inner quadrant of right female breast: Secondary | ICD-10-CM | POA: Insufficient documentation

## 2020-10-16 DIAGNOSIS — Z79899 Other long term (current) drug therapy: Secondary | ICD-10-CM | POA: Diagnosis not present

## 2020-10-16 DIAGNOSIS — Z95828 Presence of other vascular implants and grafts: Secondary | ICD-10-CM

## 2020-10-16 DIAGNOSIS — Z87891 Personal history of nicotine dependence: Secondary | ICD-10-CM | POA: Insufficient documentation

## 2020-10-16 DIAGNOSIS — Z7952 Long term (current) use of systemic steroids: Secondary | ICD-10-CM | POA: Insufficient documentation

## 2020-10-16 DIAGNOSIS — Z419 Encounter for procedure for purposes other than remedying health state, unspecified: Secondary | ICD-10-CM

## 2020-10-16 HISTORY — PX: PORTACATH PLACEMENT: SHX2246

## 2020-10-16 HISTORY — PX: BREAST LUMPECTOMY WITH RADIOACTIVE SEED AND SENTINEL LYMPH NODE BIOPSY: SHX6550

## 2020-10-16 SURGERY — BREAST LUMPECTOMY WITH RADIOACTIVE SEED AND SENTINEL LYMPH NODE BIOPSY
Anesthesia: General | Site: Chest | Laterality: Right

## 2020-10-16 MED ORDER — ORAL CARE MOUTH RINSE: 15.0000 mL | Freq: Once | OROMUCOSAL | Status: AC

## 2020-10-16 MED ORDER — FENTANYL CITRATE (PF) 100 MCG/2ML IJ SOLN: 50.0000 ug | Freq: Once | INTRAMUSCULAR | Status: AC

## 2020-10-16 MED ORDER — LIDOCAINE 2% (20 MG/ML) 5 ML SYRINGE
INTRAMUSCULAR | Status: DC | PRN
  Administered 2020-10-16: 40 mg via INTRAVENOUS

## 2020-10-16 MED ORDER — HEPARIN SOD (PORK) LOCK FLUSH 100 UNIT/ML IV SOLN
INTRAVENOUS | Status: DC | PRN
  Administered 2020-10-16: 500 [IU] via INTRAVENOUS

## 2020-10-16 MED ORDER — KETAMINE HCL 50 MG/5ML IJ SOSY
PREFILLED_SYRINGE | INTRAMUSCULAR | Status: AC
  Filled 2020-10-16: qty 5

## 2020-10-16 MED ORDER — ACETAMINOPHEN 500 MG PO TABS
1000.0000 mg | ORAL_TABLET | ORAL | Status: AC
  Administered 2020-10-16: 1000 mg via ORAL
  Filled 2020-10-16: qty 2

## 2020-10-16 MED ORDER — HYDROCODONE-ACETAMINOPHEN 5-325 MG PO TABS: 1.0000 | ORAL_TABLET | Freq: Four times a day (QID) | ORAL | 0 refills | Status: DC | PRN

## 2020-10-16 MED ORDER — MIDAZOLAM HCL 2 MG/2ML IJ SOLN
INTRAMUSCULAR | Status: DC | PRN
  Administered 2020-10-16: 2 mg via INTRAVENOUS

## 2020-10-16 MED ORDER — BUPIVACAINE-EPINEPHRINE (PF) 0.25% -1:200000 IJ SOLN
INTRAMUSCULAR | Status: AC
  Filled 2020-10-16: qty 30

## 2020-10-16 MED ORDER — DEXAMETHASONE SODIUM PHOSPHATE 10 MG/ML IJ SOLN
INTRAMUSCULAR | Status: AC
  Filled 2020-10-16: qty 1

## 2020-10-16 MED ORDER — CHLORHEXIDINE GLUCONATE CLOTH 2 % EX PADS: 6.0000 | MEDICATED_PAD | Freq: Once | CUTANEOUS | Status: DC

## 2020-10-16 MED ORDER — PHENYLEPHRINE 40 MCG/ML (10ML) SYRINGE FOR IV PUSH (FOR BLOOD PRESSURE SUPPORT)
PREFILLED_SYRINGE | INTRAVENOUS | Status: DC | PRN
  Administered 2020-10-16 (×2): 40 ug via INTRAVENOUS
  Administered 2020-10-16: 80 ug via INTRAVENOUS
  Administered 2020-10-16: 40 ug via INTRAVENOUS
  Administered 2020-10-16 (×2): 80 ug via INTRAVENOUS

## 2020-10-16 MED ORDER — HEPARIN SOD (PORK) LOCK FLUSH 100 UNIT/ML IV SOLN
INTRAVENOUS | Status: AC
  Filled 2020-10-16: qty 5

## 2020-10-16 MED ORDER — BUPIVACAINE-EPINEPHRINE 0.25% -1:200000 IJ SOLN
INTRAMUSCULAR | Status: DC | PRN
  Administered 2020-10-16: 5 mL
  Administered 2020-10-16: 7 mL
  Administered 2020-10-16: 18 mL

## 2020-10-16 MED ORDER — ONDANSETRON HCL 4 MG/2ML IJ SOLN
INTRAMUSCULAR | Status: DC | PRN
  Administered 2020-10-16: 4 mg via INTRAVENOUS

## 2020-10-16 MED ORDER — FENTANYL CITRATE (PF) 250 MCG/5ML IJ SOLN
INTRAMUSCULAR | Status: DC | PRN
  Administered 2020-10-16: 50 ug via INTRAVENOUS
  Administered 2020-10-16 (×2): 25 ug via INTRAVENOUS
  Administered 2020-10-16: 50 ug via INTRAVENOUS

## 2020-10-16 MED ORDER — LACTATED RINGERS IV SOLN: INTRAVENOUS | Status: DC

## 2020-10-16 MED ORDER — FENTANYL CITRATE (PF) 250 MCG/5ML IJ SOLN
INTRAMUSCULAR | Status: AC
  Filled 2020-10-16: qty 5

## 2020-10-16 MED ORDER — ONDANSETRON HCL 4 MG/2ML IJ SOLN
INTRAMUSCULAR | Status: AC
  Filled 2020-10-16: qty 2

## 2020-10-16 MED ORDER — SODIUM CHLORIDE 0.9 % IV SOLN
INTRAVENOUS | Status: AC
  Filled 2020-10-16: qty 1.2

## 2020-10-16 MED ORDER — SODIUM CHLORIDE 0.9 % IV SOLN
INTRAVENOUS | Status: DC | PRN
  Administered 2020-10-16: 500 mL

## 2020-10-16 MED ORDER — MIDAZOLAM HCL 2 MG/2ML IJ SOLN
INTRAMUSCULAR | Status: AC
  Filled 2020-10-16: qty 2

## 2020-10-16 MED ORDER — METHYLENE BLUE 0.5 % INJ SOLN
INTRAVENOUS | Status: AC
  Filled 2020-10-16: qty 10

## 2020-10-16 MED ORDER — KETAMINE HCL 10 MG/ML IJ SOLN
INTRAMUSCULAR | Status: DC | PRN
  Administered 2020-10-16: 15 mg via INTRAVENOUS
  Administered 2020-10-16: 5 mg via INTRAVENOUS
  Administered 2020-10-16: 10 mg via INTRAVENOUS

## 2020-10-16 MED ORDER — PROPOFOL 10 MG/ML IV BOLUS
INTRAVENOUS | Status: AC
  Filled 2020-10-16: qty 20

## 2020-10-16 MED ORDER — CEFAZOLIN SODIUM-DEXTROSE 2-4 GM/100ML-% IV SOLN
2.0000 g | INTRAVENOUS | Status: AC
  Administered 2020-10-16: 2 g via INTRAVENOUS
  Filled 2020-10-16: qty 100

## 2020-10-16 MED ORDER — PROPOFOL 10 MG/ML IV BOLUS
INTRAVENOUS | Status: DC | PRN
  Administered 2020-10-16: 180 mg via INTRAVENOUS

## 2020-10-16 MED ORDER — PHENYLEPHRINE 40 MCG/ML (10ML) SYRINGE FOR IV PUSH (FOR BLOOD PRESSURE SUPPORT)
PREFILLED_SYRINGE | INTRAVENOUS | Status: AC
  Filled 2020-10-16: qty 10

## 2020-10-16 MED ORDER — TECHNETIUM TC 99M TILMANOCEPT KIT
1.0000 | PACK | Freq: Once | INTRAVENOUS | Status: AC | PRN
  Administered 2020-10-16: 1 via INTRADERMAL

## 2020-10-16 MED ORDER — EPHEDRINE 5 MG/ML INJ
INTRAVENOUS | Status: AC
  Filled 2020-10-16: qty 10

## 2020-10-16 MED ORDER — MIDAZOLAM HCL 2 MG/2ML IJ SOLN: 1.0000 mg | Freq: Once | INTRAMUSCULAR | Status: AC

## 2020-10-16 MED ORDER — GABAPENTIN 300 MG PO CAPS
300.0000 mg | ORAL_CAPSULE | ORAL | Status: AC
  Administered 2020-10-16: 300 mg via ORAL
  Filled 2020-10-16: qty 1

## 2020-10-16 MED ORDER — FENTANYL CITRATE (PF) 100 MCG/2ML IJ SOLN
INTRAMUSCULAR | Status: AC
  Administered 2020-10-16: 50 ug via INTRAVENOUS
  Filled 2020-10-16: qty 2

## 2020-10-16 MED ORDER — CHLORHEXIDINE GLUCONATE 0.12 % MT SOLN
15.0000 mL | Freq: Once | OROMUCOSAL | Status: AC
  Administered 2020-10-16: 15 mL via OROMUCOSAL
  Filled 2020-10-16: qty 15

## 2020-10-16 MED ORDER — CELECOXIB 200 MG PO CAPS
200.0000 mg | ORAL_CAPSULE | ORAL | Status: AC
  Administered 2020-10-16: 200 mg via ORAL
  Filled 2020-10-16: qty 1

## 2020-10-16 MED ORDER — LIDOCAINE 2% (20 MG/ML) 5 ML SYRINGE
INTRAMUSCULAR | Status: AC
  Filled 2020-10-16: qty 5

## 2020-10-16 MED ORDER — EPHEDRINE SULFATE-NACL 50-0.9 MG/10ML-% IV SOSY
PREFILLED_SYRINGE | INTRAVENOUS | Status: DC | PRN
  Administered 2020-10-16: 10 mg via INTRAVENOUS

## 2020-10-16 MED ORDER — MIDAZOLAM HCL 2 MG/2ML IJ SOLN
INTRAMUSCULAR | Status: AC
  Administered 2020-10-16: 1 mg via INTRAVENOUS
  Filled 2020-10-16: qty 2

## 2020-10-16 MED ORDER — SODIUM CHLORIDE (PF) 0.9 % IJ SOLN
INTRAMUSCULAR | Status: AC
  Filled 2020-10-16: qty 10

## 2020-10-16 MED ORDER — 0.9 % SODIUM CHLORIDE (POUR BTL) OPTIME
TOPICAL | Status: DC | PRN
  Administered 2020-10-16: 1000 mL

## 2020-10-16 MED FILL — HYDROCODON-APAP 5-325: 5-325 | 2 days supply | Qty: 15 | Fill #0

## 2020-10-16 SURGICAL SUPPLY — 62 items
ADH SKN CLS APL DERMABOND .7 (GAUZE/BANDAGES/DRESSINGS) ×6
APL PRP STRL LF DISP 70% ISPRP (MISCELLANEOUS) ×4
APPLIER CLIP 9.375 MED OPEN (MISCELLANEOUS) ×6
APR CLP MED 9.3 20 MLT OPN (MISCELLANEOUS) ×4
BAG DECANTER FOR FLEXI CONT (MISCELLANEOUS) ×3 IMPLANT
BINDER BREAST 3XL (GAUZE/BANDAGES/DRESSINGS) ×1 IMPLANT
CANISTER SUCT 3000ML PPV (MISCELLANEOUS) ×3 IMPLANT
CHLORAPREP W/TINT 10.5 ML (MISCELLANEOUS) ×3 IMPLANT
CHLORAPREP W/TINT 26 (MISCELLANEOUS) ×4 IMPLANT
CLIP APPLIE 9.375 MED OPEN (MISCELLANEOUS) ×2 IMPLANT
CNTNR URN SCR LID CUP LEK RST (MISCELLANEOUS) ×2 IMPLANT
CONT SPEC 4OZ STRL OR WHT (MISCELLANEOUS) ×3
COVER PROBE W GEL 5X96 (DRAPES) ×3 IMPLANT
COVER SURGICAL LIGHT HANDLE (MISCELLANEOUS) ×3 IMPLANT
COVER TRANSDUCER ULTRASND GEL (DISPOSABLE) ×2 IMPLANT
DECANTER SPIKE VIAL GLASS SM (MISCELLANEOUS) ×3 IMPLANT
DERMABOND ADVANCED (GAUZE/BANDAGES/DRESSINGS) ×3
DERMABOND ADVANCED .7 DNX12 (GAUZE/BANDAGES/DRESSINGS) ×2 IMPLANT
DEVICE DUBIN SPECIMEN MAMMOGRA (MISCELLANEOUS) ×3 IMPLANT
DRAPE C-ARM 42X120 X-RAY (DRAPES) ×3 IMPLANT
DRAPE CHEST BREAST 15X10 FENES (DRAPES) ×3 IMPLANT
DRSG PAD ABDOMINAL 8X10 ST (GAUZE/BANDAGES/DRESSINGS) ×1 IMPLANT
ELECT CAUTERY BLADE 6.4 (BLADE) ×3 IMPLANT
ELECT COATED BLADE 2.86 ST (ELECTRODE) ×3 IMPLANT
ELECT REM PT RETURN 9FT ADLT (ELECTROSURGICAL) ×3
ELECTRODE REM PT RTRN 9FT ADLT (ELECTROSURGICAL) ×2 IMPLANT
GEL ULTRASOUND 20GR AQUASONIC (MISCELLANEOUS) IMPLANT
GLOVE BIO SURGEON STRL SZ7.5 (GLOVE) ×6 IMPLANT
GOWN STRL REUS W/ TWL LRG LVL3 (GOWN DISPOSABLE) ×4 IMPLANT
GOWN STRL REUS W/TWL LRG LVL3 (GOWN DISPOSABLE) ×9
KIT BASIN OR (CUSTOM PROCEDURE TRAY) ×3 IMPLANT
KIT MARKER MARGIN INK (KITS) ×3 IMPLANT
KIT PORT POWER 8FR ISP CVUE (Port) ×1 IMPLANT
KIT TURNOVER KIT B (KITS) ×3 IMPLANT
LIGHT WAVEGUIDE WIDE FLAT (MISCELLANEOUS) IMPLANT
NDL 18GX1X1/2 (RX/OR ONLY) (NEEDLE) IMPLANT
NDL FILTER BLUNT 18X1 1/2 (NEEDLE) IMPLANT
NDL HYPO 25GX1X1/2 BEV (NEEDLE) ×2 IMPLANT
NEEDLE 18GX1X1/2 (RX/OR ONLY) (NEEDLE) IMPLANT
NEEDLE 22X1 1/2 (OR ONLY) (NEEDLE) IMPLANT
NEEDLE FILTER BLUNT 18X 1/2SAF (NEEDLE)
NEEDLE FILTER BLUNT 18X1 1/2 (NEEDLE) IMPLANT
NEEDLE HYPO 25GX1X1/2 BEV (NEEDLE) ×3 IMPLANT
NS IRRIG 1000ML POUR BTL (IV SOLUTION) ×3 IMPLANT
PAD ARMBOARD 7.5X6 YLW CONV (MISCELLANEOUS) ×3 IMPLANT
PENCIL SMOKE EVACUATOR (MISCELLANEOUS) ×1 IMPLANT
POSITIONER HEAD DONUT 9IN (MISCELLANEOUS) ×3 IMPLANT
SHEATH COOK PEEL AWAY SET 9F (SHEATH) IMPLANT
SPONGE LAP 18X18 RF (DISPOSABLE) ×1 IMPLANT
SUT MNCRL AB 4-0 PS2 18 (SUTURE) ×7 IMPLANT
SUT PROLENE 2 0 SH 30 (SUTURE) ×4 IMPLANT
SUT VIC AB 3-0 SH 18 (SUTURE) ×3 IMPLANT
SUT VIC AB 3-0 SH 27 (SUTURE) ×3
SUT VIC AB 3-0 SH 27XBRD (SUTURE) ×2 IMPLANT
SYR 10ML LL (SYRINGE) IMPLANT
SYR 5ML LUER SLIP (SYRINGE) ×3 IMPLANT
SYR CONTROL 10ML LL (SYRINGE) ×2 IMPLANT
TOWEL GREEN STERILE (TOWEL DISPOSABLE) ×3 IMPLANT
TOWEL GREEN STERILE FF (TOWEL DISPOSABLE) ×2 IMPLANT
TRAY LAPAROSCOPIC MC (CUSTOM PROCEDURE TRAY) ×3 IMPLANT
TUBE CONNECTING 12X1/4 (SUCTIONS) ×1 IMPLANT
YANKAUER SUCT BULB TIP NO VENT (SUCTIONS) ×1 IMPLANT

## 2020-10-16 NOTE — Op Note (Signed)
10/16/2020  2:04 PM  PATIENT:  Grace Pennington  50 y.o. female  PRE-OPERATIVE DIAGNOSIS:  RIGHT BREAST CANCER  POST-OPERATIVE DIAGNOSIS:  RIGHT BREAST CANCER  PROCEDURE:  Procedure(s): RIGHT BREAST LUMPECTOMY WITH RADIOACTIVE SEED LOCALIZATION AND DEEP RIGHT AXILLARY SENTINEL LYMPH NODE BIOPSY (Right) INSERTION PORT-A-CATH (Left)  SURGEON:  Surgeon(s) and Role:    * [name], MD - Primary  PHYSICIAN ASSISTANT:   ASSISTANTS: none   ANESTHESIA:   local and general  EBL:  25 mL   BLOOD ADMINISTERED:none  DRAINS: none   LOCAL MEDICATIONS USED:  MARCAINE     SPECIMEN:  Source of Specimen:  right breast tissue and sentinel node x 2  DISPOSITION OF SPECIMEN:  PATHOLOGY  COUNTS:  YES  TOURNIQUET:  * No tourniquets in log *  DICTATION: .Dragon Dictation   After informed consent was obtained the patient was brought to the operating room and placed in the supine position on the operating table.  After adequate induction of general anesthesia was placed between the patient's shoulder blades to extend the shoulder slightly.  The left chest and neck area were then prepped with ChloraPrep, allowed to dry, and draped in usual sterile manner.  An appropriate timeout was performed.  The patient was placed in Trendelenburg position.  The area lateral to the bend of the clavicle was infiltrated with quarter percent Marcaine.  A large bore needle from the Port-A-Cath kit was used to slide beneath the bend of the clavicle heading towards the sternal notch.  I was never able to identify the left subclavian vein.  After several attempts I decided to move to the neck.  Under ultrasound guidance I was able to identify the left internal jugular vein and access this with the needle under direct vision without difficulty.  The wire was fed through the needle using the Seldinger technique without difficulty.  The wire was confirmed in the central venous system using real-time fluoroscopy.  Next a  small incision was made on the chest wall with a 15 blade knife.  The incision was carried through the skin and subcutaneous tissue sharply with the electrocautery.  A subcutaneous pocket was created inferior to the incision by blunt finger dissection.  I was then able to make a small incision at the wire entry site on the neck.  The 2 incisions were connected by blunt dissection.  The tubing was then brought through the tunnel and attached to the reservoir in the pocket.  The length of the tubing was estimated using real-time fluoroscopy and cut to the appropriate length.  Next the sheath and dilator were fed over the wire using Seldinger technique without difficulty.  The dilator and wire were removed from the patient.  The tubing was fed through the sheath as far as it would go and then held in place while the sheath was gently cracked and separated.  The tubing was then observed to be in the distal superior vena cava.  The tubing was then permanently anchored to the reservoir.  The reservoir was anchored in the pocket with two 2-0 Prolene stitches.  The port was then aspirated and it aspirated blood easily.  The port was then flushed initially with a dilute heparin solution and then with a more concentrated heparin solution.  The incision at the neck was then closed with an interrupted 4-0 Monocryl subcuticular stitch.  The subcutaneous tissue was closed over the reservoir with interrupted 3-0 Vicryl stitches.  The skin was closed with a  running 4-0 Monocryl subcuticular stitch.  Dermabond dressings were applied.  The drapes were then removed from the patient and the arms were placed on arm boards.  The right chest, breast, and axillary area were then prepped with ChloraPrep, allowed to dry, and draped in usual sterile manner.  Another appropriate timeout was performed.  The neoprobe was set to technetium and there was a good signal in the right axilla.  This area was infiltrated with quarter percent Marcaine.   A small transversely oriented incision was made overlying the area of radioactivity with a 15 blade knife.  The incision was carried through the skin and subcutaneous tissue sharply with the electrocautery until the deep right axillary space was entered.  Blunt hemostat dissection was carried out under the direction of the neoprobe.  I was able to identify 2 hot lymph nodes.  These were excised sharply with the electrocautery and the surrounding small vessels and lymphatics were controlled with clips.  Ex vivo counts on these nodes ranged from 200 to 2000.  No other hot or palpable nodes were identified in the right axilla.  Hemostasis was achieved using the Bovie electrocautery.  The deep layer of the wound was closed with interrupted 3-0 Vicryl stitches.  The skin was closed with a running 4-0 Monocryl subcuticular stitch.  Attention was then turned to the right breast.  The neoprobe was set to I-125 in the area of radioactivity was readily identified in the upper inner quadrant.  The area around this was infiltrated with quarter percent Marcaine.  A curvilinear incision was made along the upper inner edge of the areola of the right breast.  The incision was carried through the skin and subcutaneous tissue sharply with the electrocautery.  Dissection was carried out between the breast tissue and the subcutaneous fat and skin until the dissection was well beyond the area of the cancer.  A wedge of breast tissue was then excised sharply with the electrocautery around the radioactive seed while checking the area of radioactivity frequently.  Once the specimen was removed it was oriented with the appropriate paint colors.  A specimen radiograph was obtained that showed the clip and seed to be near the center of the specimen.  The specimen was then sent to pathology for further evaluation.  Hemostasis was achieved using the Bovie electrocautery.  The wound was irrigated with saline and infiltrated with more quarter  percent Marcaine.  The cavity was marked with clips.  The wound was then closed with layers of interrupted 3-0 Vicryl stitches.  The skin was closed with interrupted 4-0 Monocryl subcuticular stitches.  Dermabond dressings were applied.  The patient tolerated the procedure well.  At the end of the case all needle sponge and instrument counts were correct.  The patient was then awakened and taken recovery in stable condition.  PLAN OF CARE: Discharge to home after PACU  PATIENT DISPOSITION:  PACU - hemodynamically stable.   Delay start of Pharmacological VTE agent (>24hrs) due to surgical blood loss or risk of bleeding: not applicable

## 2020-10-16 NOTE — H&P (Signed)
Grace Pennington  Location: [facility] Patient #: [id number] DOB: [birth year] Single / Language: Grace Pennington / Race: Black or African American Female   History of Present Illness  The patient is a 50 year old female who presents with breast cancer. We are asked to see the patient in consultation by Dr. polite to evaluate her for a new right breast cancer. The patient is a 50 year old black female who recently went for a routine screening mammogram. At that time she was found to have a 7 mm area of asymmetry in the upper inner quadrant of the right breast. The lymph nodes looked normal. The area was biopsied and came back as a Reid to invasive breast cancer that was ER and PR positive and HER-2 positive with a Ki-67 of 30%. She does have a family history of breast cancer significant in her mother. She is otherwise healthy and does not smoke.   Past Surgical History  No pertinent past surgical history   Diagnostic Studies History Colonoscopy  within last year Mammogram  within last year Pap Smear  1-5 years ago  Allergies  No Known Drug Allergies   Medication History  ALPRAZolam (0.25MG Tablet, Oral) Active. Cephalexin (250MG Capsule, Oral) Active. Citalopram Hydrobromide (10MG Tablet, Oral) Active. Cyclobenzaprine HCl (10MG Tablet, Oral) Active. hydroCHLOROthiazide (25MG Tablet, Oral) Active. Pantoprazole Sodium (40MG Tablet DR, Oral) Active. predniSONE (20MG Tablet, Oral) Active. Rosuvastatin Calcium (10MG Tablet, Oral) Active. Spironolactone (50MG Tablet, Oral) Active. Medications Reconciled  Social History  Alcohol use  Occasional alcohol use. Caffeine use  Coffee. No drug use  Tobacco use  Never smoker.  Family History  Diabetes Mellitus  Father. Hypertension  Father. Prostate Cancer  Father.  Pregnancy / Birth History  Age at menarche  45 years. Age of menopause  <46 Gravida  2 Irregular periods  Length (months) of  breastfeeding  3-6 Maternal age  5-25 Para  2  Other Problems  Breast Cancer  Sleep Apnea     Review of Systems  General Not Present- Appetite Loss, Chills, Fatigue, Fever, Night Sweats, Weight Gain and Weight Loss. Skin Not Present- Change in Wart/Mole, Dryness, Hives, Jaundice, New Lesions, Non-Healing Wounds, Rash and Ulcer. HEENT Not Present- Earache, Hearing Loss, Hoarseness, Nose Bleed, Oral Ulcers, Ringing in the Ears, Seasonal Allergies, Sinus Pain, Sore Throat, Visual Disturbances, Wears glasses/contact lenses and Yellow Eyes. Respiratory Not Present- Bloody sputum, Chronic Cough, Difficulty Breathing, Snoring and Wheezing. Breast Present- Breast Mass. Not Present- Breast Pain, Nipple Discharge and Skin Changes. Cardiovascular Not Present- Chest Pain, Difficulty Breathing Lying Down, Leg Cramps, Palpitations, Rapid Heart Rate, Shortness of Breath and Swelling of Extremities. Gastrointestinal Not Present- Abdominal Pain, Bloating, Bloody Stool, Change in Bowel Habits, Chronic diarrhea, Constipation, Difficulty Swallowing, Excessive gas, Gets full quickly at meals, Hemorrhoids, Indigestion, Nausea, Rectal Pain and Vomiting. Female Genitourinary Not Present- Frequency, Nocturia, Painful Urination, Pelvic Pain and Urgency. Musculoskeletal Not Present- Back Pain, Joint Pain, Joint Stiffness, Muscle Pain, Muscle Weakness and Swelling of Extremities. Neurological Not Present- Decreased Memory, Fainting, Headaches, Numbness, Seizures, Tingling, Tremor, Trouble walking and Weakness. Psychiatric Not Present- Anxiety, Bipolar, Change in Sleep Pattern, Depression, Fearful and Frequent crying. Endocrine Not Present- Cold Intolerance, Excessive Hunger, Hair Changes, Heat Intolerance, Hot flashes and New Diabetes. Hematology Not Present- Blood Thinners, Easy Bruising, Excessive bleeding, Gland problems, HIV and Persistent Infections.  Vitals  Weight: 259 lb Height: 66in Body Surface  Area: 2.23 m Body Mass Index: 41.8 kg/m  Temp.: 96.24F  Pulse: 88 (Regular)  P.OX: 96% (  Room air) BP: 120/70(Sitting, Left Arm, Standard)       Physical Exam General Mental Status-Alert. General Appearance-Consistent with stated age. Hydration-Well hydrated. Voice-Normal.  Head and Neck Head-normocephalic, atraumatic with no lesions or palpable masses. Trachea-midline. Thyroid Gland Characteristics - normal size and consistency.  Eye Eyeball - Bilateral-Extraocular movements intact. Sclera/Conjunctiva - Bilateral-No scleral icterus.  Chest and Lung Exam Chest and lung exam reveals -quiet, even and easy respiratory effort with no use of accessory muscles and on auscultation, normal breath sounds, no adventitious sounds and normal vocal resonance. Inspection Chest Wall - Normal. Back - normal.  Breast Note: There is no palpable mass in either breast. There is no palpable axillary, supraclavicular, or cervical lymphadenopathy.   Cardiovascular Cardiovascular examination reveals -normal heart sounds, regular rate and rhythm with no murmurs and normal pedal pulses bilaterally.  Abdomen Inspection Inspection of the abdomen reveals - No Hernias. Skin - Scar - no surgical scars. Palpation/Percussion Palpation and Percussion of the abdomen reveal - Soft, Non Tender, No Rebound tenderness, No Rigidity (guarding) and No hepatosplenomegaly. Auscultation Auscultation of the abdomen reveals - Bowel sounds normal.  Neurologic Neurologic evaluation reveals -alert and oriented x 3 with no impairment of recent or remote memory. Mental Status-Normal.  Musculoskeletal Normal Exam - Left-Upper Extremity Strength Normal and Lower Extremity Strength Normal. Normal Exam - Right-Upper Extremity Strength Normal and Lower Extremity Strength Normal.  Lymphatic Head & Neck  General Head & Neck Lymphatics: Bilateral - Description -  Normal. Axillary  General Axillary Region: Bilateral - Description - Normal. Tenderness - Non Tender. Femoral & Inguinal  Generalized Femoral & Inguinal Lymphatics: Bilateral - Description - Normal. Tenderness - Non Tender.    Assessment & Plan  MALIGNANT NEOPLASM OF UPPER-INNER QUADRANT OF RIGHT BREAST IN FEMALE, ESTROGEN RECEPTOR POSITIVE (C50.211) Impression: The patient appears to have a 7 mm invasive breast cancer with clinically negative nodes in the upper inner quadrant of the right breast. I have discussed with her in detail the different options for treatment and at this point she favors breast conservation which I feel is a very reasonable way of treating her cancer. She is also a good candidate for sentinel node biopsy. Because she is HER-2 positive she will likely also need chemotherapy and will need a Port-A-Cath. I have discussed with her in detail the risks and benefits of the operation as well as some of the technical aspects including use of a radioactive seed for localization and she understands and wishes to proceed. I will go ahead and refer to medical and radiation oncology as well as to physical therapy for preop lymphedema testing. This patient encounter took 60 minutes today to perform the following: take history, perform exam, review outside records, interpret imaging, counsel the patient on their diagnosis and document encounter, findings & plan in the EHR Current Plans Referred to Oncology, for evaluation and follow up (Oncology). Routine. Referred to Physical Therapy, for evaluation and follow up (Physical Therapy). Routine.

## 2020-10-16 NOTE — Anesthesia Procedure Notes (Addendum)
Anesthesia Regional Block: Pectoralis block   Pre-Anesthetic Checklist: ,, timeout performed, Correct Patient, Correct Site, Correct Laterality, Correct Procedure, Correct Position, site marked, Risks and benefits discussed,  Surgical consent,  Pre-op evaluation,  At surgeon's request and post-op pain management  Laterality: Right  Prep: chloraprep       Needles:  Injection technique: Single-shot  Needle Type: Echogenic Needle     Needle Length: 9cm  Needle Gauge: 21     Additional Needles:   Narrative:  Start time: 10/16/2020 9:05 AM End time: 10/16/2020 9:15 AM Injection made incrementally with aspirations every 5 mL.  Performed by: Personally  Anesthesiologist: Roberts Gaudy, MD  Additional Notes: 30 cc 0.25% Bupivacaine with 1:200 Epi injected

## 2020-10-16 NOTE — Transfer of Care (Signed)
Immediate Anesthesia Transfer of Care Note  Patient: Grace Pennington  Procedure(s) Performed: RIGHT BREAST LUMPECTOMY WITH RADIOACTIVE SEED AND SENTINEL LYMPH NODE BIOPSY (Right Breast) INSERTION PORT-A-CATH (Left Chest)  Patient Location: PACU  Anesthesia Type:General and Regional  Level of Consciousness: drowsy  Airway & Oxygen Therapy: Patient Spontanous Breathing and Patient connected to face mask oxygen  Post-op Assessment: Report given to RN and Post -op Vital signs reviewed and stable  Post vital signs: Reviewed and stable  Last Vitals:  Vitals Value Taken Time  BP 122/76 10/16/20 1415  Temp    Pulse 78 10/16/20 1416  Resp 18 10/16/20 1416  SpO2 100 % 10/16/20 1416  Vitals shown include unvalidated device data.  Last Pain:  Vitals:   10/16/20 0910  TempSrc:   PainSc: 0-No pain         Complications: No complications documented.

## 2020-10-16 NOTE — Anesthesia Procedure Notes (Signed)
Procedure Name: LMA Insertion Date/Time: 10/16/2020 11:28 AM Performed by: Hendricks Limes, CRNA Pre-anesthesia Checklist: Patient identified, Emergency Drugs available, Suction available and Patient being monitored Patient Re-evaluated:Patient Re-evaluated prior to induction Oxygen Delivery Method: Circle system utilized Preoxygenation: Pre-oxygenation with 100% oxygen Induction Type: IV induction Ventilation: Mask ventilation without difficulty LMA: LMA inserted LMA Size: 4.0 Tube type: Oral Number of attempts: 1 Placement Confirmation: positive ETCO2 and breath sounds checked- equal and bilateral Tube secured with: Tape Dental Injury: Teeth and Oropharynx as per pre-operative assessment

## 2020-10-16 NOTE — Interval H&P Note (Signed)
History and Physical Interval Note:  10/16/2020 9:27 AM  Grace Pennington  has presented today for surgery, with the diagnosis of RIGHT BREAST CANCER.  The various methods of treatment have been discussed with the patient and family. After consideration of risks, benefits and other options for treatment, the patient has consented to  Procedure(s) with comments: RIGHT BREAST LUMPECTOMY WITH RADIOACTIVE SEED AND SENTINEL LYMPH NODE BIOPSY (Right) - START TIME OF 10:00 AM FOR 150 MINUTES ROOM 2 INSERTION PORT-A-CATH (N/A) as a surgical intervention.  The patient's history has been reviewed, patient examined, no change in status, stable for surgery.  I have reviewed the patient's chart and labs.  Questions were answered to the patient's satisfaction.     [name]

## 2020-10-16 NOTE — Anesthesia Preprocedure Evaluation (Signed)
Anesthesia Evaluation  Patient identified by MRN, date of birth, ID band Patient awake    Reviewed: Allergy & Precautions, NPO status , Patient's Chart, lab work & pertinent test results  Airway Mallampati: III  TM Distance: >3 FB Neck ROM: Full    Dental  (+) Teeth Intact, Dental Advisory Given   Pulmonary former smoker,    breath sounds clear to auscultation       Cardiovascular hypertension,  Rhythm:Regular Rate:Normal     Neuro/Psych    GI/Hepatic   Endo/Other    Renal/GU      Musculoskeletal   Abdominal   Peds  Hematology   Anesthesia Other Findings   Reproductive/Obstetrics                             Anesthesia Physical Anesthesia Plan  ASA: III  Anesthesia Plan: General   Post-op Pain Management:    Induction: Intravenous  PONV Risk Score and Plan: Ondansetron and Dexamethasone  Airway Management Planned: LMA  Additional Equipment:   Intra-op Plan:   Post-operative Plan:   Informed Consent: I have reviewed the patients History and Physical, chart, labs and discussed the procedure including the risks, benefits and alternatives for the proposed anesthesia with the patient or authorized representative who has indicated his/her understanding and acceptance.     Dental advisory given  Plan Discussed with: CRNA and Anesthesiologist  Anesthesia Plan Comments: (Plan GA with LMA and Pectoralis block  Roberts Gaudy)        Anesthesia Quick Evaluation

## 2020-10-16 NOTE — Anesthesia Postprocedure Evaluation (Signed)
Anesthesia Post Note  Patient: Grace Pennington  Procedure(s) Performed: RIGHT BREAST LUMPECTOMY WITH RADIOACTIVE SEED AND SENTINEL LYMPH NODE BIOPSY (Right Breast) INSERTION PORT-A-CATH (Left Chest)     Patient location during evaluation: PACU Anesthesia Type: General and Regional Level of consciousness: awake and alert Pain management: pain level controlled Vital Signs Assessment: post-procedure vital signs reviewed and stable Respiratory status: spontaneous breathing, nonlabored ventilation, respiratory function stable and patient connected to nasal cannula oxygen Cardiovascular status: blood pressure returned to baseline and stable Postop Assessment: no apparent nausea or vomiting Anesthetic complications: no   No complications documented.  Last Vitals:  Vitals:   10/16/20 1430 10/16/20 1445  BP: 122/82 120/87  Pulse: 87 88  Resp: 15 12  Temp:  36.7 C  SpO2: 96% 98%    Last Pain:  Vitals:   10/16/20 1445  TempSrc:   PainSc: 0-No pain                 Piya Mesch COKER

## 2020-10-17 ENCOUNTER — Encounter (HOSPITAL_COMMUNITY): Payer: Self-pay | Admitting: General Surgery

## 2020-10-18 LAB — SURGICAL PATHOLOGY

## 2020-10-19 ENCOUNTER — Encounter: Payer: Self-pay | Admitting: *Deleted

## 2020-10-19 ENCOUNTER — Other Ambulatory Visit (HOSPITAL_BASED_OUTPATIENT_CLINIC_OR_DEPARTMENT_OTHER): Payer: Self-pay

## 2020-10-20 MED FILL — SPIRONOLACTONE 50 MG TABLET: 50 | 30 days supply | Qty: 60 | Fill #1

## 2020-10-22 NOTE — Progress Notes (Signed)
Patient Care Team: Grace Carol, MD as PCP - General (Internal Medicine)  DIAGNOSIS:    ICD-10-CM   1. Malignant neoplasm of upper-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, estrogen receptor positive ([place])  C50.211    Z17.0     SUMMARY OF ONCOLOGIC HISTORY: Oncology History  Malignant neoplasm of upper-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, estrogen receptor positive ([place])  09/08/2020 Cancer Staging   Staging form: Breast, AJCC 8th Edition - Clinical stage from 09/08/2020: Stage IA (cT1b, cN0, cM0, G2, ER+, PR+, HER2+) - Signed by Grace Phlegm, NP on 09/20/2020 Stage prefix: Initial diagnosis   09/08/2020 Initial Diagnosis   Mammogram detected 0.7 cm mass in the right breast 1 o'clock position, right breast biopsy 1: Grade 2 IDC ER 95% PR 95%, Ki-67 30%, HER-2 positive ratio 2.83   10/16/2020 Surgery   Right lumpectomy Grace Pennington): IDC, 1.0cm, grade 3, with high grade DCIS, clear margins, 4 right axillary lymph nodes negative for carcinoma.     CHIEF COMPLIANT: Follow-up s/p lumpectomy   INTERVAL HISTORY: KYMARI Pennington is a 50 y.o. with above-mentioned history of right breast cancer. She underwent a right lumpectomy with Dr. Marlou Pennington on 10/16/20 for which pathology showed IDC, 1.0cm, grade 3, with high grade DCIS, clear margins, 4 right axillary lymph nodes negative for carcinoma. She presents to the clinic today to review the pathology report and discuss further treatment.   ALLERGIES:  has No Known Allergies.  MEDICATIONS:  Current Outpatient Medications  Medication Sig Dispense Refill  . ALPRAZolam (XANAX) 0.25 MG tablet Take 0.25 mg by mouth daily as needed for anxiety.    . citalopram (CELEXA) 10 MG tablet Take 10 mg by mouth daily.  6  . fluticasone (FLONASE) 50 MCG/ACT nasal spray Place 2 sprays into both nostrils daily as needed for allergies.    . hydrochlorothiazide (HYDRODIURIL) 25 MG tablet Take 25 mg by mouth daily.    Marland Kitchen HYDROcodone-acetaminophen  (NORCO/VICODIN) 5-325 MG tablet Take 1-2 tablets by mouth every 6 (six) hours as needed for moderate pain or severe pain. 15 tablet 0  . hydroxypropyl methylcellulose / hypromellose (ISOPTO TEARS / GONIOVISC) 2.5 % ophthalmic solution Place 1 drop into both eyes 3 (three) times daily as needed (dry/irritated eyes).    Marland Kitchen loratadine (CLARITIN) 10 MG tablet Take 10 mg by mouth daily as needed for allergies.    . Multiple Vitamins-Minerals (MULTIVITAMIN WITH MINERALS) tablet Take 1 tablet by mouth daily.    . pantoprazole (PROTONIX) 40 MG tablet Take 40 mg by mouth daily before breakfast.    . rosuvastatin (CRESTOR) 10 MG tablet Take 10 mg by mouth daily.    Marland Kitchen spironolactone (ALDACTONE) 50 MG tablet Take 50 mg by mouth daily.     No current facility-administered medications for this visit.    PHYSICAL EXAMINATION: ECOG PERFORMANCE STATUS: 1 - Symptomatic but completely ambulatory  Vitals:   10/23/20 0902  BP: 113/83  Pulse: 90  Resp: 18  Temp: (!) 97.3 F (36.3 C)  SpO2: 97%   Filed Weights   10/23/20 0902  Weight: 256 lb (116.1 kg)     LABORATORY DATA:  I have reviewed the data as listed CMP Latest Ref Rng & Units 10/11/2020 08/13/2018 02/26/2018  Glucose 70 - 99 mg/dL 122(H) 96 83  BUN 6 - 20 mg/dL 13 19 17   Creatinine 0.44 - 1.00 mg/dL 1.00 0.95 0.97  Sodium 135 - 145 mmol/L 138 143 142  Potassium 3.5 - 5.1 mmol/L 3.7  4.1 4.0  Chloride 98 - 111 mmol/L 104 102 101  CO2 22 - 32 mmol/L 25 25 24   Calcium 8.9 - 10.3 mg/dL 9.2 9.6 9.6  Total Protein 6.0 - 8.5 g/dL - 7.3 7.3  Total Bilirubin 0.0 - 1.2 mg/dL - 0.2 0.3  Alkaline Phos 39 - 117 IU/L - 77 66  AST 0 - 40 IU/L - 20 17  ALT 0 - 32 IU/L - 28 22    Lab Results  Component Value Date   WBC 5.6 10/11/2020   HGB 14.5 10/11/2020   HCT 44.5 10/11/2020   MCV 90.6 10/11/2020   PLT 244 10/11/2020   NEUTROABS 2.5 10/07/2017    ASSESSMENT & PLAN:  Malignant neoplasm of upper-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, estrogen  receptor positive ([facility]) 09/08/2020:Mammogram detected 0.7 cm mass in the right breast 1 o'clock position, right breast biopsy 1: Grade 2 IDC ER 95% PR 95%, Ki-67 30%, HER-2 positive ratio 2.83 T1b N0 stage Ia  10/16/20: Grade 3 IDC with DCIS 0/4 LN  ER 95% PR 95%, Ki-67 30%, HER-2 positive ratio 2.83  Pathology counseling: I discussed the final pathology report of the patient provided  a copy of this report. I discussed the margins as well as lymph node surgeries. We also discussed the final staging along with previously performed ER/PR and HER-2/neu testing.  Treatment Plan: 1. adjuvant Taxol Herceptin followed by Herceptin maintenance for 1 year plan to start in 3 weeks 2.  Adjuvant radiation 3.  Followed by adjuvant antiestrogen therapy  Chemo counseling: Discussed with the risks and benefits of chemo including hair loss, fatigue, nausea, cardiac ejection fraction changes related to Herceptin, neuropathy risk from Taxol, fatigue, decreased blood counts, LFT changes.  RTC in 3 weeks to start chemo   No orders of the defined types were placed in this encounter.  The patient has a good understanding of the overall plan. she agrees with it. she will call with any problems that may develop before the next visit here.  Total time spent: 30 mins including face to face time and time spent for planning, charting and coordination of care  Grace Eisenmenger, MD, MPH 10/23/2020  I, Grace Pennington, am acting as scribe for Dr. Nicholas Pennington.  I have reviewed the above documentation for accuracy and completeness, and I agree with the above.

## 2020-10-22 NOTE — Assessment & Plan Note (Signed)
09/08/2020:Mammogram detected 0.7 cm mass in the right breast 1 o'clock position, right breast biopsy 1: Grade 2 IDC ER 95% PR 95%, Ki-67 30%, HER-2 positive ratio 2.83 T1b N0 stage Ia  10/16/20: Grade 3 IDC with DCIS 0/4 LN  ER 95% PR 95%, Ki-67 30%, HER-2 positive ratio 2.83  Pathology counseling: I discussed the final pathology report of the patient provided  a copy of this report. I discussed the margins as well as lymph node surgeries. We also discussed the final staging along with previously performed ER/PR and HER-2/neu testing.  Treatment Plan: 1. adjuvant Taxol Herceptin followed by Herceptin maintenance for 1 year 2.  Adjuvant radiation 3.  Followed by adjuvant antiestrogen therapy  RTC in 2 weeks to start chemo

## 2020-10-23 ENCOUNTER — Other Ambulatory Visit: Payer: Self-pay

## 2020-10-23 ENCOUNTER — Inpatient Hospital Stay
Payer: No Typology Code available for payment source | Attending: Hematology and Oncology | Admitting: Hematology and Oncology

## 2020-10-23 ENCOUNTER — Other Ambulatory Visit: Payer: Self-pay | Admitting: Hematology and Oncology

## 2020-10-23 VITALS — BP 113/83 | HR 90 | Temp 97.3°F | Resp 18 | Ht 66.0 in | Wt 256.0 lb

## 2020-10-23 DIAGNOSIS — Z170 Estrogen receptor positive status [ER+]: Secondary | ICD-10-CM

## 2020-10-23 DIAGNOSIS — C50211 Malignant neoplasm of upper-inner quadrant of right female breast: Secondary | ICD-10-CM

## 2020-10-23 DIAGNOSIS — Z79899 Other long term (current) drug therapy: Secondary | ICD-10-CM | POA: Insufficient documentation

## 2020-10-23 DIAGNOSIS — Z923 Personal history of irradiation: Secondary | ICD-10-CM | POA: Insufficient documentation

## 2020-10-23 MED ORDER — LIDOCAINE-PRILOCAINE 2.5-2.5 % EX CREA: TOPICAL_CREAM | CUTANEOUS | 3 refills | Status: DC

## 2020-10-23 MED ORDER — PROCHLORPERAZINE MALEATE 10 MG PO TABS: 10.0000 mg | ORAL_TABLET | Freq: Four times a day (QID) | ORAL | 1 refills | Status: DC | PRN

## 2020-10-23 MED ORDER — ONDANSETRON HCL 8 MG PO TABS: 8.0000 mg | ORAL_TABLET | Freq: Two times a day (BID) | ORAL | 1 refills | Status: DC | PRN

## 2020-10-23 MED FILL — ONDANSETRON HCL 8 MG TABLET: 8 | 15 days supply | Qty: 30 | Fill #0

## 2020-10-23 MED FILL — PROCHLORPERAZINE 10 MG TAB: 10 | 7 days supply | Qty: 30 | Fill #0

## 2020-10-23 MED FILL — LIDOCAINE-PRILOCAINE CREAM: 2.5-2.5 | 30 days supply | Qty: 30 | Fill #0

## 2020-10-23 NOTE — Progress Notes (Signed)
START ON PATHWAY REGIMEN - Breast     Cycle 1: A cycle is 7 days:     Trastuzumab-xxxx      Paclitaxel    Cycles 2 through 12: A cycle is every 7 days:     Trastuzumab-xxxx      Paclitaxel    Cycles 13 through 25: A cycle is every 21 days:     Trastuzumab-xxxx   **Always confirm dose/schedule in your pharmacy ordering system**  Patient Characteristics: Postoperative without Neoadjuvant Therapy (Pathologic Staging), Invasive Disease, Adjuvant Therapy, HER2 Positive, ER Positive, Node Negative, pT1b, pN0/N65m, Chemotherapy Indicated Therapeutic Status: Postoperative without Neoadjuvant Therapy (Pathologic Staging) AJCC Grade: G3 AJCC N Category: pN0 AJCC M Category: cM0 ER Status: Positive (+) AJCC 8 Stage Grouping: IA HER2 Status: Positive (+) Oncotype Dx Recurrence Score: Not Appropriate AJCC T Category: pT1b PR Status: Positive (+) Adjuvant Therapy Status: No Adjuvant Therapy Received Yet or Changing Initial Adjuvant Regimen due to Tolerance Intervention Indicated: Chemotherapy Intent of Therapy: Curative Intent, Discussed with Patient

## 2020-10-24 ENCOUNTER — Encounter: Payer: Self-pay | Admitting: *Deleted

## 2020-10-27 ENCOUNTER — Inpatient Hospital Stay: Payer: No Typology Code available for payment source | Attending: Hematology and Oncology

## 2020-10-27 ENCOUNTER — Other Ambulatory Visit: Payer: Self-pay

## 2020-10-27 DIAGNOSIS — Z5112 Encounter for antineoplastic immunotherapy: Secondary | ICD-10-CM | POA: Insufficient documentation

## 2020-10-27 DIAGNOSIS — Z5111 Encounter for antineoplastic chemotherapy: Secondary | ICD-10-CM | POA: Insufficient documentation

## 2020-10-27 DIAGNOSIS — C50211 Malignant neoplasm of upper-inner quadrant of right female breast: Secondary | ICD-10-CM | POA: Insufficient documentation

## 2020-10-27 DIAGNOSIS — Z79899 Other long term (current) drug therapy: Secondary | ICD-10-CM | POA: Insufficient documentation

## 2020-10-27 DIAGNOSIS — Z170 Estrogen receptor positive status [ER+]: Secondary | ICD-10-CM | POA: Insufficient documentation

## 2020-10-30 ENCOUNTER — Encounter: Payer: Self-pay | Admitting: *Deleted

## 2020-11-06 ENCOUNTER — Other Ambulatory Visit: Payer: Self-pay

## 2020-11-06 ENCOUNTER — Ambulatory Visit (HOSPITAL_COMMUNITY)
Admission: RE | Admit: 2020-11-06 | Discharge: 2020-11-06 | Disposition: A | Discharge status: Home / Self Care | Payer: No Typology Code available for payment source | Source: Ambulatory Visit | Attending: Hematology and Oncology | Admitting: Hematology and Oncology

## 2020-11-06 DIAGNOSIS — Z0189 Encounter for other specified special examinations: Secondary | ICD-10-CM | POA: Diagnosis not present

## 2020-11-06 DIAGNOSIS — C50211 Malignant neoplasm of upper-inner quadrant of right female breast: Secondary | ICD-10-CM | POA: Insufficient documentation

## 2020-11-06 DIAGNOSIS — Z01810 Encounter for preprocedural cardiovascular examination: Secondary | ICD-10-CM | POA: Insufficient documentation

## 2020-11-06 DIAGNOSIS — I10 Essential (primary) hypertension: Secondary | ICD-10-CM | POA: Diagnosis not present

## 2020-11-06 DIAGNOSIS — Z170 Estrogen receptor positive status [ER+]: Secondary | ICD-10-CM

## 2020-11-06 LAB — ECHOCARDIOGRAM COMPLETE
Area-P 1/2: 1.68 cm2
S' Lateral: 2.4 cm

## 2020-11-06 NOTE — Progress Notes (Signed)
  Echocardiogram 2D Echocardiogram has been performed.  Grace Pennington 11/06/2020, 10:09 AM

## 2020-11-07 ENCOUNTER — Ambulatory Visit: Payer: No Typology Code available for payment source | Attending: General Surgery | Admitting: Rehabilitation

## 2020-11-07 ENCOUNTER — Encounter: Payer: Self-pay | Admitting: Rehabilitation

## 2020-11-07 DIAGNOSIS — R293 Abnormal posture: Secondary | ICD-10-CM | POA: Insufficient documentation

## 2020-11-07 DIAGNOSIS — Z483 Aftercare following surgery for neoplasm: Secondary | ICD-10-CM | POA: Insufficient documentation

## 2020-11-07 DIAGNOSIS — M25611 Stiffness of right shoulder, not elsewhere classified: Secondary | ICD-10-CM | POA: Insufficient documentation

## 2020-11-07 DIAGNOSIS — Z9189 Other specified personal risk factors, not elsewhere classified: Secondary | ICD-10-CM | POA: Insufficient documentation

## 2020-11-07 NOTE — Therapy (Signed)
[place], [place], [address] Phone: [phone]   Fax:  [phone]  Physical Therapy Treatment  Patient Details  Name: Grace Pennington MRN: [id number] Date of Birth: 09-13-1970 Referring Provider (PT): Joaquin Courts Date: 11/07/2020   PT End of Session - 11/07/20 0949    Visit Number 2    Number of Visits 6    Date for PT Re-Evaluation 12/19/20    PT Start Time 0906    PT Stop Time 0945    PT Time Calculation (min) 39 min    Activity Tolerance Patient tolerated treatment well    Behavior During Therapy [facility] for tasks assessed/performed           Past Medical History:  Diagnosis Date  . Anxiety   . Breast cancer in female [facility])    Right  . Depression   . GERD (gastroesophageal reflux disease)   . Hypertension   . Palpitations   . Sleep apnea     Past Surgical History:  Procedure Laterality Date  . BREAST LUMPECTOMY WITH RADIOACTIVE SEED AND SENTINEL LYMPH NODE BIOPSY Right 10/16/2020   Procedure: RIGHT BREAST LUMPECTOMY WITH RADIOACTIVE SEED AND SENTINEL LYMPH NODE BIOPSY;  Surgeon: [name], MD;  Location: [place];  Service: General;  Laterality: Right;  . PORTACATH PLACEMENT Left 10/16/2020   Procedure: INSERTION PORT-A-CATH;  Surgeon: [name], MD;  Location: [place];  Service: General;  Laterality: Left;  . TUBAL LIGATION      There were no vitals filed for this visit.   Subjective Assessment - 11/07/20 0905    Subjective Just a little tender outside of the arm. Denies breast or arm.  Chemo starts 11/13/20    Pertinent History Rtlumpectomy and SLNB on 10/16/20 with Dr. [name] 0/4 nodes positive due to ER+ breast cancer with chemotherapy and radiation to follow.  HTN    Currently in Pain? No/denies              [facility] PT Assessment - 11/07/20 0001      Observation/Other Assessments   Observations healing incisions around right upper nipple and in axilla.  Puffiness in  axilla pt reports is normal for her      AROM   Right Shoulder Extension 85 Degrees    Right Shoulder Flexion 167 Degrees   pull in forearm   Right Shoulder ABduction 169 Degrees    Right Shoulder External Rotation 84 Degrees      Palpation   Palpation comment 3-4 cords noted in axilla with arm held out in abduction and horizontal abduction             LYMPHEDEMA/ONCOLOGY QUESTIONNAIRE - 11/07/20 0001      Right Upper Extremity Lymphedema   15 cm Proximal to Olecranon Process 42 cm    Olecranon Process 32.5 cm    10 cm Proximal to Ulnar Styloid Process 27.4 cm    Just Proximal to Ulnar Styloid Process 20.9 cm    Across Hand at [employer] 24 cm    At [place] of 2nd Digit 7.5 cm              Quick Dash - 11/07/20 0001    Open a tight or new jar Mild difficulty    Do heavy household chores (wash walls, wash floors) No difficulty    Carry a shopping bag or briefcase No difficulty    Wash your back Mild difficulty  Use a knife to cut food No difficulty    Recreational activities in which you take some force or impact through your arm, shoulder, or hand (golf, hammering, tennis) Mild difficulty    During the past week, to what extent has your arm, shoulder or hand problem interfered with your normal social activities with family, friends, neighbors, or groups? Slightly    During the past week, to what extent has your arm, shoulder or hand problem limited your work or other regular daily activities Not at all    Arm, shoulder, or hand pain. Mild    Tingling (pins and needles) in your arm, shoulder, or hand Mild    Difficulty Sleeping Mild difficulty    DASH Score 15.91 %                  [facility] Adult PT Treatment/Exercise - 11/07/20 0001      Self-Care   Self-Care Other Self-Care Comments    Other Self-Care Comments  taught pt scar massage with handout.  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 per instruction section today                  PT Education - 11/07/20 0949    Education Details risk reduction ,scar massage, cording, POC, walking importance during chemo    Person(s) Educated Patient    Methods Explanation;Demonstration;Verbal cues;Handout    Comprehension Verbalized understanding               PT Long Term Goals - 11/07/20 0957      PT LONG TERM GOAL #1   Title Pt will return to baseline AROM without pull due to cording    Baseline pt has returned to baseline AROM but has pull with overhead flexion due to cording    Time 6    Period Weeks    Status New      PT LONG TERM GOAL #2   Title Pt will be ind with HEP for continued UE strength and mobility throughout radiation and after    Time 6    Period Weeks    Status New                 Plan - 11/07/20 0950    Clinical Impression Statement Pt returns post Rt lumpectomy and removal of 4 negative lymph nodes with chemotherapy starting next week.  Pt has returned to full AROM but demonstrates pull into the forearm with overhead flexion and has 3-4 cords in the Rt axilla evident with horizontal abduction.  Pt is healing well and has no signs of breast or UE lymphedema.  Pt will benefit from a few sessions of PT at this time to make sure that cording is improving and pt is ind with self stretches.    Personal Factors and Comorbidities Fitness    Examination-Activity Limitations Reach Overhead    Examination-Participation Restrictions Meal Prep;Yard Work    Merchant [employer] officer Stable/Uncomplicated    Designer, jewellery Low    Rehab Potential Excellent    PT Frequency 1x / week    PT Duration 6 weeks    PT Treatment/Interventions Therapeutic exercise;Patient/family education;Manual lymph drainage;ADLs/Self Care Home Management;Manual techniques;Passive range of motion    PT Next Visit Plan schedule 3 month SOZO, MFR and  PROM for Rt cording, review exercises, more visits after  2 if needed    Consulted and Agree with Plan of Care Patient           Patient will benefit from skilled therapeutic intervention in order to improve the following deficits and impairments:  Postural dysfunction,Decreased safety awareness,Decreased range of motion,Impaired flexibility  Visit Diagnosis: Aftercare following surgery for neoplasm  Stiffness of right shoulder, not elsewhere classified  At risk for lymphedema     Problem List Patient Active Problem List   Diagnosis Date Noted  . Malignant neoplasm of upper-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, estrogen receptor positive ([place]) 09/20/2020  . Precordial pain 04/14/2014  . Essential hypertension, benign 08/03/2013  . Family history of ischemic heart disease 08/03/2013    Stark Bray 11/07/2020, 9:59 AM  [place], [place], [address] Phone: [phone]   Fax:  [phone]  Name: Grace Pennington MRN: [id number] Date of Birth: 01/02/1971

## 2020-11-07 NOTE — Patient Instructions (Signed)
Scar Massage  Scar massage is done to improve the mobility of scar, decrease scar tissue from building up, reduce adhesions, and prevent Keloids from forming. Start scar massage after scabs have fallen off by themselves and no open areas. The first few weeks after surgery, it is normal for a scar to appear pink or red and slightly raised. Scars can itch or have areas of numbness. Some scars may be sensitive.   Direct Scar massage: after scar is healed, no opening, no scab 1.  Place pads of two fingers together directly on the scar starting at one end of the scar. Move the fingers up and down across the scar holding 5 seconds one direction.  Then go opposite direction hold 5 seconds.  2. Move over to the next section of the scar and repeat.  Work your way along the entire length of the scar.   3. Next make diagonal movements along the scar holding 5 seconds at one direction. 4. Next movement is side to side. 5. Do not rub fingers over the scar.  Instead keep firm pressure and move scar over the tissue it is on top   Scar Lift and Roll 12 weeks after surgery. 1. Pinch a small amount of the scar between your first two fingers and thumb.  2. Roll the scar between your fingers for 5 to 15 seconds. 3. Move along the scar and repeat until you have massaged the entire length of scar.   Stop the massage and call your doctor if you notice: 1. Increased redness 2. Bleeding from scar 3. Seepage coming from the scar 4. Scar is warmer and has increased pain

## 2020-11-10 NOTE — Progress Notes (Signed)
Pharmacist Chemotherapy Monitoring - Initial Assessment    Anticipated start date: 11/13/20   Regimen:  . Are orders appropriate based on the patient's diagnosis, regimen, and cycle? Yes . Does the plan date match the patient's scheduled date? Yes . Is the sequencing of drugs appropriate? Yes . Are the premedications appropriate for the patient's regimen? Yes . Prior Authorization for treatment is: Approved o If applicable, is the correct biosimilar selected based on the patient's insurance? yes  Organ Function and Labs: Marland Kitchen Are dose adjustments needed based on the patient's renal function, hepatic function, or hematologic function? No . Are appropriate labs ordered prior to the start of patient's treatment? Yes . Other organ system assessment, if indicated: trastuzumab: Echo/ MUGA . The following baseline labs, if indicated, have been ordered: N/A  Dose Assessment: . Are the drug doses appropriate? Yes . Are the following correct: o Drug concentrations Yes o IV fluid compatible with drug Yes o Administration routes Yes o Timing of therapy Yes . If applicable, does the patient have documented access for treatment and/or plans for port-a-cath placement? yes . If applicable, have lifetime cumulative doses been properly documented and assessed? yes Lifetime Dose Tracking  No doses have been documented on this patient for the following tracked chemicals: Doxorubicin, Epirubicin, Idarubicin, Daunorubicin, Mitoxantrone, Bleomycin, Oxaliplatin, Carboplatin, Liposomal Doxorubicin  o   Toxicity Monitoring/Prevention: . The patient has the following take home antiemetics prescribed: Ondansetron and Prochlorperazine . The patient has the following take home medications prescribed: N/A . Medication allergies and previous infusion related reactions, if applicable, have been reviewed and addressed. Yes . The patient's current medication list has been assessed for drug-drug interactions with their  chemotherapy regimen. no significant drug-drug interactions were identified on review.  Order Review: . Are the treatment plan orders signed? Yes . Is the patient scheduled to see a provider prior to their treatment? Yes  I verify that I have reviewed each item in the above checklist and answered each question accordingly.   Kennith Center, Pharm.D., CPP 11/10/2020@12 :58 PM

## 2020-11-13 ENCOUNTER — Other Ambulatory Visit: Payer: Self-pay

## 2020-11-13 ENCOUNTER — Inpatient Hospital Stay: Payer: No Typology Code available for payment source

## 2020-11-13 ENCOUNTER — Encounter: Payer: Self-pay | Admitting: *Deleted

## 2020-11-13 ENCOUNTER — Inpatient Hospital Stay (HOSPITAL_BASED_OUTPATIENT_CLINIC_OR_DEPARTMENT_OTHER): Payer: No Typology Code available for payment source | Admitting: Medical

## 2020-11-13 VITALS — BP 114/76 | HR 79 | Temp 96.2°F | Resp 18 | Ht 66.0 in | Wt 262.7 lb

## 2020-11-13 VITALS — BP 102/68 | HR 89 | Temp 98.5°F | Resp 17

## 2020-11-13 DIAGNOSIS — Z170 Estrogen receptor positive status [ER+]: Secondary | ICD-10-CM

## 2020-11-13 DIAGNOSIS — Z5112 Encounter for antineoplastic immunotherapy: Secondary | ICD-10-CM | POA: Diagnosis present

## 2020-11-13 DIAGNOSIS — Z79899 Other long term (current) drug therapy: Secondary | ICD-10-CM | POA: Diagnosis not present

## 2020-11-13 DIAGNOSIS — C50211 Malignant neoplasm of upper-inner quadrant of right female breast: Secondary | ICD-10-CM | POA: Diagnosis not present

## 2020-11-13 DIAGNOSIS — Z5111 Encounter for antineoplastic chemotherapy: Secondary | ICD-10-CM | POA: Diagnosis present

## 2020-11-13 LAB — CMP (CANCER CENTER ONLY)
ALT: 36 U/L (ref 0–44)
AST: 27 U/L (ref 15–41)
Albumin: 3.9 g/dL (ref 3.5–5.0)
Alkaline Phosphatase: 75 U/L (ref 38–126)
Anion gap: 10 (ref 5–15)
BUN: 14 mg/dL (ref 6–20)
CO2: 26 mmol/L (ref 22–32)
Calcium: 9.6 mg/dL (ref 8.9–10.3)
Chloride: 103 mmol/L (ref 98–111)
Creatinine: 1.01 mg/dL — ABNORMAL HIGH (ref 0.44–1.00)
GFR, Estimated: >60 mL/min (ref >60)
Glucose, Bld: 130 mg/dL — ABNORMAL HIGH (ref 70–99)
Potassium: 3.7 mmol/L (ref 3.5–5.1)
Sodium: 139 mmol/L (ref 135–145)
Total Bilirubin: 0.5 mg/dL (ref 0.3–1.2)
Total Protein: 7.7 g/dL (ref 6.5–8.1)

## 2020-11-13 LAB — CBC WITH DIFFERENTIAL (CANCER CENTER ONLY)
Abs Immature Granulocytes: 0 10*3/uL (ref 0.00–0.07)
Basophils Absolute: 0.1 10*3/uL (ref 0.0–0.1)
Basophils Relative: 1 %
Eosinophils Absolute: 0.3 10*3/uL (ref 0.0–0.5)
Eosinophils Relative: 6 %
HCT: 43.2 % (ref 36.0–46.0)
Hemoglobin: 14.7 g/dL (ref 12.0–15.0)
Immature Granulocytes: 0 %
Lymphocytes Relative: 41 %
Lymphs Abs: 2.3 10*3/uL (ref 0.7–4.0)
MCH: 29.6 pg (ref 26.0–34.0)
MCHC: 34 g/dL (ref 30.0–36.0)
MCV: 87.1 fL (ref 80.0–100.0)
Monocytes Absolute: 0.5 10*3/uL (ref 0.1–1.0)
Monocytes Relative: 9 %
Neutro Abs: 2.4 10*3/uL (ref 1.7–7.7)
Neutrophils Relative %: 43 %
Platelet Count: 237 10*3/uL (ref 150–400)
RBC: 4.96 MIL/uL (ref 3.87–5.11)
RDW: 14.1 % (ref 11.5–15.5)
WBC Count: 5.6 10*3/uL (ref 4.0–10.5)
nRBC: 0 % (ref 0.0–0.2)

## 2020-11-13 MED ORDER — SODIUM CHLORIDE 0.9 % IV SOLN
Freq: Once | INTRAVENOUS | Status: AC
  Filled 2020-11-13: qty 250

## 2020-11-13 MED ORDER — FAMOTIDINE IN NACL 20-0.9 MG/50ML-% IV SOLN
20.0000 mg | Freq: Once | INTRAVENOUS | Status: AC
  Administered 2020-11-13: 20 mg via INTRAVENOUS

## 2020-11-13 MED ORDER — TRASTUZUMAB-DKST CHEMO 150 MG IV SOLR
4.0000 mg/kg | Freq: Once | INTRAVENOUS | Status: AC
  Administered 2020-11-13: 462 mg via INTRAVENOUS
  Filled 2020-11-13: qty 22

## 2020-11-13 MED ORDER — FAMOTIDINE IN NACL 20-0.9 MG/50ML-% IV SOLN
INTRAVENOUS | Status: AC
  Filled 2020-11-13: qty 50

## 2020-11-13 MED ORDER — SODIUM CHLORIDE 0.9% FLUSH
10.0000 mL | INTRAVENOUS | Status: DC | PRN
  Administered 2020-11-13: 10 mL
  Filled 2020-11-13: qty 10

## 2020-11-13 MED ORDER — DEXAMETHASONE SODIUM PHOSPHATE 100 MG/10ML IJ SOLN
10.0000 mg | Freq: Once | INTRAMUSCULAR | Status: AC
  Administered 2020-11-13: 10 mg via INTRAVENOUS
  Filled 2020-11-13: qty 10

## 2020-11-13 MED ORDER — ACETAMINOPHEN 325 MG PO TABS
650.0000 mg | ORAL_TABLET | Freq: Once | ORAL | Status: AC
Start: 2020-11-13 — End: 2020-11-13
  Administered 2020-11-13: 650 mg via ORAL

## 2020-11-13 MED ORDER — DIPHENHYDRAMINE HCL 50 MG/ML IJ SOLN
INTRAMUSCULAR | Status: AC
  Filled 2020-11-13: qty 1

## 2020-11-13 MED ORDER — HEPARIN SOD (PORK) LOCK FLUSH 100 UNIT/ML IV SOLN
500.0000 [IU] | Freq: Once | INTRAVENOUS | Status: AC | PRN
  Administered 2020-11-13: 500 [IU]
  Filled 2020-11-13: qty 5

## 2020-11-13 MED ORDER — ACETAMINOPHEN 325 MG PO TABS
ORAL_TABLET | ORAL | Status: AC
  Filled 2020-11-13: qty 2

## 2020-11-13 MED ORDER — PACLITAXEL CHEMO INJECTION 300 MG/50ML
80.0000 mg/m2 | Freq: Once | INTRAVENOUS | Status: AC
  Administered 2020-11-13: 186 mg via INTRAVENOUS
  Filled 2020-11-13: qty 31

## 2020-11-13 MED ORDER — DIPHENHYDRAMINE HCL 50 MG/ML IJ SOLN
25.0000 mg | Freq: Once | INTRAMUSCULAR | Status: AC
  Administered 2020-11-13: 25 mg via INTRAVENOUS

## 2020-11-13 MED ORDER — SODIUM CHLORIDE 0.9% FLUSH
10.0000 mL | Freq: Once | INTRAVENOUS | Status: AC
  Administered 2020-11-13: 10 mL via INTRAVENOUS
  Filled 2020-11-13: qty 10

## 2020-11-13 NOTE — Patient Instructions (Signed)
[facility] Discharge Instructions for Patients Receiving Chemotherapy  Today you received the following chemotherapy agents: trastuzumab, paclitaxel  To help prevent nausea and vomiting after your treatment, we encourage you to take your nausea medication as prescribed by your physician.    If you develop nausea and vomiting that is not controlled by your nausea medication, call the clinic.   BELOW ARE SYMPTOMS THAT SHOULD BE REPORTED IMMEDIATELY:  *FEVER GREATER THAN 100.5 F  *CHILLS WITH OR WITHOUT FEVER  NAUSEA AND VOMITING THAT IS NOT CONTROLLED WITH YOUR NAUSEA MEDICATION  *UNUSUAL SHORTNESS OF BREATH  *UNUSUAL BRUISING OR BLEEDING  TENDERNESS IN MOUTH AND THROAT WITH OR WITHOUT PRESENCE OF ULCERS  *URINARY PROBLEMS  *BOWEL PROBLEMS  UNUSUAL RASH Items with * indicate a potential emergency and should be followed up as soon as possible.  Feel free to call the clinic should you have any questions or concerns. The clinic phone number is [phone].  Please show the [facility] at check-in to the Emergency Department and triage nurse.  Trastuzumab injection for infusion What is this medicine? TRASTUZUMAB (tras TOO zoo mab) is a monoclonal antibody. It is used to treat breast cancer and stomach cancer. This medicine may be used for other purposes; ask your health care provider or pharmacist if you have questions. COMMON BRAND NAME(S): Herceptin, Galvin Proffer, Trazimera What should I tell my health care provider before I take this medicine? They need to know if you have any of these conditions:  heart disease  heart failure  lung or breathing disease, like asthma  an unusual or allergic reaction to trastuzumab, benzyl alcohol, or other medications, foods, dyes, or preservatives  pregnant or trying to get pregnant  breast-feeding How should I use this medicine? This drug is given as an infusion into a vein. It is  administered in a hospital or clinic by a specially trained health care professional. Talk to your pediatrician regarding the use of this medicine in children. This medicine is not approved for use in children. Overdosage: If you think you have taken too much of this medicine contact a poison control center or emergency room at once. NOTE: This medicine is only for you. Do not share this medicine with others. What if I miss a dose? It is important not to miss a dose. Call your doctor or health care professional if you are unable to keep an appointment. What may interact with this medicine? This medicine may interact with the following medications:  certain types of chemotherapy, such as daunorubicin, doxorubicin, epirubicin, and idarubicin This list may not describe all possible interactions. Give your health care provider a list of all the medicines, herbs, non-prescription drugs, or dietary supplements you use. Also tell them if you smoke, drink alcohol, or use illegal drugs. Some items may interact with your medicine. What should I watch for while using this medicine? Visit your doctor for checks on your progress. Report any side effects. Continue your course of treatment even though you feel ill unless your doctor tells you to stop. Call your doctor or health care professional for advice if you get a fever, chills or sore throat, or other symptoms of a cold or flu. Do not treat yourself. Try to avoid being around people who are sick. You may experience fever, chills and shaking during your first infusion. These effects are usually mild and can be treated with other medicines. Report any side effects during the infusion to your health  care professional. Fever and chills usually do not happen with later infusions. Do not become pregnant while taking this medicine or for 7 months after stopping it. Women should inform their doctor if they wish to become pregnant or think they might be pregnant. Women  of child-bearing potential will need to have a negative pregnancy test before starting this medicine. There is a potential for serious side effects to an unborn child. Talk to your health care professional or pharmacist for more information. Do not breast-feed an infant while taking this medicine or for 7 months after stopping it. Women must use effective birth control with this medicine. What side effects may I notice from receiving this medicine? Side effects that you should report to your doctor or health care professional as soon as possible:  allergic reactions like skin rash, itching or hives, swelling of the face, lips, or tongue  chest pain or palpitations  cough  dizziness  feeling faint or lightheaded, falls  fever  general ill feeling or flu-like symptoms  signs of worsening heart failure like breathing problems; swelling in your legs and feet  unusually weak or tired Side effects that usually do not require medical attention (report to your doctor or health care professional if they continue or are bothersome):  bone pain  changes in taste  diarrhea  joint pain  nausea/vomiting  weight loss This list may not describe all possible side effects. Call your doctor for medical advice about side effects. You may report side effects to FDA at 1-800-FDA-1088. Where should I keep my medicine? This drug is given in a hospital or clinic and will not be stored at home. NOTE: This sheet is a summary. It may not cover all possible information. If you have questions about this medicine, talk to your doctor, pharmacist, or health care provider.  2021 Elsevier/Gold Standard (2016-07-09 14:37:52)  Paclitaxel injection What is this medicine? PACLITAXEL (PAK li TAX el) is a chemotherapy drug. It targets fast dividing cells, like cancer cells, and causes these cells to die. This medicine is used to treat ovarian cancer, breast cancer, lung cancer, Kaposi's sarcoma, and other  cancers. This medicine may be used for other purposes; ask your health care provider or pharmacist if you have questions. COMMON BRAND NAME(S): Onxol, Taxol What should I tell my health care provider before I take this medicine? They need to know if you have any of these conditions:  history of irregular heartbeat  liver disease  low blood counts, like low white cell, platelet, or red cell counts  lung or breathing disease, like asthma  tingling of the fingers or toes, or other nerve disorder  an unusual or allergic reaction to paclitaxel, alcohol, polyoxyethylated castor oil, other chemotherapy, other medicines, foods, dyes, or preservatives  pregnant or trying to get pregnant  breast-feeding How should I use this medicine? This drug is given as an infusion into a vein. It is administered in a hospital or clinic by a specially trained health care professional. Talk to your pediatrician regarding the use of this medicine in children. Special care may be needed. Overdosage: If you think you have taken too much of this medicine contact a poison control center or emergency room at once. NOTE: This medicine is only for you. Do not share this medicine with others. What if I miss a dose? It is important not to miss your dose. Call your doctor or health care professional if you are unable to keep an appointment. What may interact  with this medicine? Do not take this medicine with any of the following medications:  live virus vaccines This medicine may also interact with the following medications:  antiviral medicines for hepatitis, HIV or AIDS  certain antibiotics like erythromycin and clarithromycin  certain medicines for fungal infections like ketoconazole and itraconazole  certain medicines for seizures like carbamazepine, phenobarbital, phenytoin  gemfibrozil  nefazodone  rifampin  St. John's wort This list may not describe all possible interactions. Give your health  care provider a list of all the medicines, herbs, non-prescription drugs, or dietary supplements you use. Also tell them if you smoke, drink alcohol, or use illegal drugs. Some items may interact with your medicine. What should I watch for while using this medicine? Your condition will be monitored carefully while you are receiving this medicine. You will need important blood work done while you are taking this medicine. This medicine can cause serious allergic reactions. To reduce your risk you will need to take other medicine(s) before treatment with this medicine. If you experience allergic reactions like skin rash, itching or hives, swelling of the face, lips, or tongue, tell your doctor or health care professional right away. In some cases, you may be given additional medicines to help with side effects. Follow all directions for their use. This drug may make you feel generally unwell. This is not uncommon, as chemotherapy can affect healthy cells as well as cancer cells. Report any side effects. Continue your course of treatment even though you feel ill unless your doctor tells you to stop. Call your doctor or health care professional for advice if you get a fever, chills or sore throat, or other symptoms of a cold or flu. Do not treat yourself. This drug decreases your body's ability to fight infections. Try to avoid being around people who are sick. This medicine may increase your risk to bruise or bleed. Call your doctor or health care professional if you notice any unusual bleeding. Be careful brushing and flossing your teeth or using a toothpick because you may get an infection or bleed more easily. If you have any dental work done, tell your dentist you are receiving this medicine. Avoid taking products that contain aspirin, acetaminophen, ibuprofen, naproxen, or ketoprofen unless instructed by your doctor. These medicines may hide a fever. Do not become pregnant while taking this medicine.  Women should inform their doctor if they wish to become pregnant or think they might be pregnant. There is a potential for serious side effects to an unborn child. Talk to your health care professional or pharmacist for more information. Do not breast-feed an infant while taking this medicine. Men are advised not to father a child while receiving this medicine. This product may contain alcohol. Ask your pharmacist or healthcare provider if this medicine contains alcohol. Be sure to tell all healthcare providers you are taking this medicine. Certain medicines, like metronidazole and disulfiram, can cause an unpleasant reaction when taken with alcohol. The reaction includes flushing, headache, nausea, vomiting, sweating, and increased thirst. The reaction can last from 30 minutes to several hours. What side effects may I notice from receiving this medicine? Side effects that you should report to your doctor or health care professional as soon as possible:  allergic reactions like skin rash, itching or hives, swelling of the face, lips, or tongue  breathing problems  changes in vision  fast, irregular heartbeat  high or low blood pressure  mouth sores  pain, tingling, numbness in the hands or  feet  signs of decreased platelets or bleeding - bruising, pinpoint red spots on the skin, black, tarry stools, blood in the urine  signs of decreased red blood cells - unusually weak or tired, feeling faint or lightheaded, falls  signs of infection - fever or chills, cough, sore throat, pain or difficulty passing urine  signs and symptoms of liver injury like dark yellow or brown urine; general ill feeling or flu-like symptoms; light-colored stools; loss of appetite; nausea; right upper belly pain; unusually weak or tired; yellowing of the eyes or skin  swelling of the ankles, feet, hands  unusually slow heartbeat Side effects that usually do not require medical attention (report to your doctor or  health care professional if they continue or are bothersome):  diarrhea  hair loss  loss of appetite  muscle or joint pain  nausea, vomiting  pain, redness, or irritation at site where injected  tiredness This list may not describe all possible side effects. Call your doctor for medical advice about side effects. You may report side effects to FDA at 1-800-FDA-1088. Where should I keep my medicine? This drug is given in a hospital or clinic and will not be stored at home. NOTE: This sheet is a summary. It may not cover all possible information. If you have questions about this medicine, talk to your doctor, pharmacist, or health care provider.  2021 Elsevier/Gold Standard (2019-06-16 13:37:23)

## 2020-11-14 ENCOUNTER — Telehealth: Payer: Self-pay

## 2020-11-14 ENCOUNTER — Telehealth: Payer: Self-pay | Admitting: *Deleted

## 2020-11-14 NOTE — Telephone Encounter (Signed)
Called pt to see how she did with her treatment yest.  She reports that she did well. She had slight nausea this am & drank some tea with lemon & ginger & it went away.  She denies any other problems.  She is concerned about going back to work full time & thinks she may have to reduce her hours around chemo.  Transferred to Dr Geralyn Flash nurse & also gave fax # for her to send any paperwork for MD to sign.

## 2020-11-14 NOTE — Telephone Encounter (Signed)
Patient called to request how to file for intermittent FMLA.  RN provided secure fax number.  Pt aware to allow for 7-10 business days.

## 2020-11-14 NOTE — Telephone Encounter (Signed)
-----   Message from Teodoro Spray, RN sent at 11/13/2020  4:12 PM EDT ----- Regarding: Grace Pennington first time chemo followup Dr. Lindi Pennington patient first time ogivri/taxol on 11/13/20. Patient tolerated treatment well. Thank you.

## 2020-11-15 NOTE — Progress Notes (Signed)
[facility] Progress Note   Grace Pennington [id number] 09/19/1970 50 y.o.  Grace Pennington is managed by Dr. Nicholas Lose  Actively treated with chemotherapy/immunotherapy/hormonal therapy: yes  Current therapy: Taxol and Ogivri  Next scheduled appointment with provider: 11/20/2020  Assessment: Plan:    Malignant neoplasm of upper-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, estrogen receptor positive (Grace Pennington)   ER positive malignant neoplasm of the right breast: Grace Pennington presents to clinic today for consideration of cycle 1, day 1 of Taxol and Ogivri.  We will proceed with her treatment today and will have her return for follow-up and ongoing chemotherapy on 11/20/2020.  Please see After Visit Summary for patient specific instructions.  Future Appointments  Date Time Provider [place]  11/20/2020  7:45 AM [facility] [place] None  11/20/2020  8:15 AM Nicholas Lose, MD [facility] None  11/20/2020  9:15 AM [facility] INFUSION [facility] None  11/21/2020  9:00 AM Grace Pennington, PTA [facility] None  11/27/2020  9:45 AM [facility] [place] FLUSH [facility] None  11/27/2020 10:45 AM [facility] INFUSION [facility] None  11/28/2020  9:00 AM Grace Pennington, PTA [facility] None  12/04/2020  9:45 AM [facility] [place] FLUSH [facility] None  12/04/2020 10:15 AM Nicholas Lose, MD [facility] None  12/04/2020 11:15 AM [facility] INFUSION [facility] None  12/11/2020  9:45 AM [facility] [place] FLUSH [facility] None  12/11/2020 10:45 AM [facility] INFUSION [facility] None    No orders of the defined types were placed in this encounter.      Subjective:   Patient ID:  Grace Pennington is Pennington 50 y.o. (DOB 1971-02-03) female.  Chief Complaint: No chief complaint on file.   HPI Grace Pennington  is Pennington 50 y.o. female with Pennington diagnosis of an ER positive malignant neoplasm of the right breast.  She is followed by Dr. Nicholas Lose and presents to clinic today for consideration of  cycle 1, day 1 of Taxol and Ogivri.  She denies any issues of concern today.  She is ready to begin her therapy.  She denies fevers, chills, sweats, nausea, vomiting, constipation, or diarrhea.  She has Zofran and Compazine at home if needed.  Medications: I have reviewed the patient's current medications.  Allergies: No Known Allergies  Past Medical History:  Diagnosis Date  . Anxiety   . Breast cancer in female [facility])    Right  . Depression   . GERD (gastroesophageal reflux disease)   . Hypertension   . Palpitations   . Sleep apnea     Past Surgical History:  Procedure Laterality Date  . BREAST LUMPECTOMY WITH RADIOACTIVE SEED AND SENTINEL LYMPH NODE BIOPSY Right 10/16/2020   Procedure: RIGHT BREAST LUMPECTOMY WITH RADIOACTIVE SEED AND SENTINEL LYMPH NODE BIOPSY;  Surgeon: Grace Kussmaul, MD;  Location: [facility];  Service: General;  Laterality: Right;  . PORTACATH PLACEMENT Left 10/16/2020   Procedure: INSERTION PORT-Pennington-CATH;  Surgeon: Grace Kussmaul, MD;  Location: [facility];  Service: General;  Laterality: Left;  . TUBAL LIGATION      Family History  Problem Relation Age of Onset  . Heart disease Mother   . Diabetes Mother   . Hyperlipidemia Mother   . Hypertension Mother   . Kidney failure Mother   . Diabetes Other   . Stroke Other   . Hypertension Other   . Kidney failure Other   . Coronary artery disease Other   . Sleep apnea Father   . Diabetes Father   .  Hyperlipidemia Father   . Hypertension Father     Social History   Socioeconomic History  . Marital status: Single    Spouse name: Not on file  . Number of children: 2  . Years of education: Not on file  . Highest education level: Not on file  Occupational History  . Occupation: medical records    Employer: [employer]  Tobacco Use  . Smoking status: Former Smoker    Quit date: 07/29/1996    Years since quitting: 24.3  . Smokeless tobacco: Never Used  Vaping Use  . Vaping Use: Never used  Substance and  Sexual Activity  . Alcohol use: Yes    Comment: socially  . Drug use: No  . Sexual activity: Yes    Birth control/protection: Surgical  Other Topics Concern  . Not on file  Social History Narrative  . Not on file   Social Determinants of Health   Financial Resource Strain: Not on file  Food Insecurity: Not on file  Transportation Needs: Not on file  Physical Activity: Not on file  Stress: Not on file  Social Connections: Not on file  Intimate Partner Violence: Not At Risk  . Fear of Current or Ex-Partner: No  . Emotionally Abused: No  . Physically Abused: No  . Sexually Abused: No    Past Medical History, Surgical history, Social history, and Family history were reviewed and updated as appropriate.   Please see review of systems for further details on the patient's review from today.   Review of Systems:  Review of Systems  Constitutional: Negative for appetite change, chills, diaphoresis and fever.  HENT: Negative for trouble swallowing and voice change.   Respiratory: Negative for cough, choking, chest tightness, shortness of breath and wheezing.   Cardiovascular: Negative for chest pain and palpitations.  Gastrointestinal: Negative for abdominal pain, constipation, diarrhea, nausea and vomiting.  Genitourinary: Negative for decreased urine volume.  Musculoskeletal: Negative for back pain and myalgias.  Neurological: Negative for dizziness, light-headedness and headaches.    Objective:   Physical Exam:  BP 114/76 (BP Location: Left Arm, Patient Position: Sitting)   Pulse 79   Temp (!) 96.2 F (35.7 C) (Tympanic)   Resp 18   Ht 5\' 6"  (1.676 m)   Wt 262 lb 11.2 oz (119.2 kg)   LMP 01/13/2018   SpO2 97%   BMI 42.40 kg/m  ECOG: 0  Physical Exam Constitutional:      General: She is not in acute distress.    Appearance: She is not diaphoretic.  HENT:     Head: Normocephalic and atraumatic.  Cardiovascular:     Rate and Rhythm: Normal rate and regular  rhythm.     Heart sounds: Normal heart sounds. No murmur heard. No friction rub. No gallop.   Pulmonary:     Effort: Pulmonary effort is normal. No respiratory distress.     Breath sounds: Normal breath sounds. No wheezing or rales.  Skin:    General: Skin is warm and dry.     Findings: No erythema or rash.  Neurological:     Mental Status: She is alert.     Coordination: Coordination normal.     Gait: Gait normal.  Psychiatric:        Mood and Affect: Mood normal.        Behavior: Behavior normal.        Thought Content: Thought content normal.        Judgment: Judgment normal.  Lab Review:     Component Value Date/Time   NA 139 11/13/2020 1025   NA 143 08/13/2018 0749   K 3.7 11/13/2020 1025   CL 103 11/13/2020 1025   CO2 26 11/13/2020 1025   GLUCOSE 130 (H) 11/13/2020 1025   BUN 14 11/13/2020 1025   BUN 19 08/13/2018 0749   CREATININE 1.01 (H) 11/13/2020 1025   CALCIUM 9.6 11/13/2020 1025   PROT 7.7 11/13/2020 1025   PROT 7.3 08/13/2018 0749   ALBUMIN 3.9 11/13/2020 1025   ALBUMIN 4.2 08/13/2018 0749   AST 27 11/13/2020 1025   ALT 36 11/13/2020 1025   ALKPHOS 75 11/13/2020 1025   BILITOT 0.5 11/13/2020 1025   GFRNONAA >60 11/13/2020 1025   GFRAA 83 08/13/2018 0749       Component Value Date/Time   WBC 5.6 11/13/2020 1025   WBC 5.6 10/11/2020 1013   RBC 4.96 11/13/2020 1025   HGB 14.7 11/13/2020 1025   HGB 12.6 10/07/2017 1200   HCT 43.2 11/13/2020 1025   HCT 39.4 10/07/2017 1200   PLT 237 11/13/2020 1025   MCV 87.1 11/13/2020 1025   MCV 83 10/07/2017 1200   MCH 29.6 11/13/2020 1025   MCHC 34.0 11/13/2020 1025   RDW 14.1 11/13/2020 1025   RDW 16.6 (H) 10/07/2017 1200   LYMPHSABS 2.3 11/13/2020 1025   LYMPHSABS 2.8 10/07/2017 1200   MONOABS 0.5 11/13/2020 1025   EOSABS 0.3 11/13/2020 1025   EOSABS 0.2 10/07/2017 1200   BASOSABS 0.1 11/13/2020 1025   BASOSABS 0.0 10/07/2017 1200   -------------------------------  Imaging from last 24 hours  (if applicable):  Radiology interpretation: MM Breast Surgical Specimen  Result Date: 10/16/2020 CLINICAL DATA:  Evaluate surgical specimen following lumpectomy for RIGHT breast cancer. EXAM: SPECIMEN RADIOGRAPH OF THE RIGHT BREAST COMPARISON:  Previous exam(s). FINDINGS: Status post excision of the RIGHT breast. The radioactive seed and RIBBON biopsy marker clip are present, completely intact, and were marked for pathology. IMPRESSION: Specimen radiograph of the RIGHT breast. Electronically Signed   By: Margarette Canada M.D.   On: 10/16/2020 13:42   DG Chest Port 1 View  Result Date: 10/16/2020 CLINICAL DATA:  Status post port catheter placement EXAM: PORTABLE CHEST 1 VIEW COMPARISON:  01/03/2017 FINDINGS: Interval placement of left neck port catheter. The catheter tubing within the neck is partially excluded at the superior aspect of the image and cannot be fully assessed. The catheter tip is positioned over the superior vena cava. Low volume AP portable examination without acute abnormality of the lungs. IMPRESSION: Interval placement of left neck port catheter. The catheter tubing within the neck is partially excluded at the superior aspect of the image and cannot be fully assessed. The catheter tip is positioned over the superior vena cava. No acute abnormality of the lungs. Electronically Signed   By: Eddie Candle M.D.   On: 10/16/2020 14:56   DG Fluoro Guide CV Line-No Report  Result Date: 10/16/2020 Fluoroscopy was utilized by the requesting physician.  No radiographic interpretation.   ECHOCARDIOGRAM COMPLETE  Result Date: 11/06/2020    ECHOCARDIOGRAM REPORT   Patient Name:   Grace Pennington Date of Exam: 11/06/2020 Medical Rec #:  [id number]      Height:       66.0 in Accession #:    [id number]     Weight:       256.0 lb Date of Birth:  12-Jul-1971      BSA:  2.221 m Patient Age:    79 years       BP:           124/72 mmHg Patient Gender: F              HR:           74 bpm. Exam Location:   Outpatient Procedure: 2D Echo, Cardiac Doppler, Color Doppler and Strain Analysis Indications:    Chemo Z09  History:        Patient has no prior history of Echocardiogram examinations.                 Risk Factors:Hypertension.  Sonographer:    Bernadene Person RDCS Referring Phys: [id number] Nicholas Lose IMPRESSIONS  1. Left ventricular ejection fraction, by estimation, is 60 to 65%. The left ventricle has normal function. The left ventricle has no regional wall motion abnormalities. Left ventricular diastolic parameters were normal. The average left ventricular global longitudinal strain is -24.8 %. The global longitudinal strain is normal.  2. Right ventricular systolic function is normal. The right ventricular size is normal. There is normal pulmonary artery systolic pressure.  3. The mitral valve is normal in structure. No evidence of mitral valve regurgitation. No evidence of mitral stenosis.  4. The aortic valve is tricuspid. Aortic valve regurgitation is not visualized. No aortic stenosis is present.  5. The inferior vena cava is normal in size with greater than 50% respiratory variability, suggesting right atrial pressure of 3 mmHg. Comparison(s): No prior Echocardiogram. Conclusion(s)/Recommendation(s): Normal biventricular function without evidence of hemodynamically significant valvular heart disease. FINDINGS  Left Ventricle: Left ventricular ejection fraction, by estimation, is 60 to 65%. The left ventricle has normal function. The left ventricle has no regional wall motion abnormalities. The average left ventricular global longitudinal strain is -24.8 %. The global longitudinal strain is normal. The left ventricular internal cavity size was normal in size. There is borderline left ventricular hypertrophy. Left ventricular diastolic parameters were normal. Right Ventricle: The right ventricular size is normal. No increase in right ventricular wall thickness. Right ventricular systolic function is normal.  There is normal pulmonary artery systolic pressure. The tricuspid regurgitant velocity is 2.08 m/s, and  with an assumed right atrial pressure of 3 mmHg, the estimated right ventricular systolic pressure is 10.2 mmHg. Left Atrium: Left atrial size was normal in size. Right Atrium: Right atrial size was normal in size. Pericardium: There is no evidence of pericardial effusion. Mitral Valve: The mitral valve is normal in structure. No evidence of mitral valve regurgitation. No evidence of mitral valve stenosis. Tricuspid Valve: The tricuspid valve is normal in structure. Tricuspid valve regurgitation is trivial. No evidence of tricuspid stenosis. Aortic Valve: The aortic valve is tricuspid. Aortic valve regurgitation is not visualized. No aortic stenosis is present. Pulmonic Valve: The pulmonic valve was not well visualized. Pulmonic valve regurgitation is not visualized. Aorta: The aortic root, ascending aorta and aortic arch are all structurally normal, with no evidence of dilitation or obstruction. Venous: The inferior vena cava is normal in size with greater than 50% respiratory variability, suggesting right atrial pressure of 3 mmHg. IAS/Shunts: No atrial level shunt detected by color flow Doppler.  LEFT VENTRICLE PLAX 2D LVIDd:         4.00 cm  Diastology LVIDs:         2.40 cm  LV e' medial:    8.55 cm/s LV PW:         0.90 cm  LV  E/e' medial:  5.4 LV IVS:        0.90 cm  LV e' lateral:   12.30 cm/s LVOT diam:     2.10 cm  LV E/e' lateral: 3.8 LV SV:         67 LV SV Index:   30       2D Longitudinal Strain LVOT Area:     3.46 cm 2D Strain GLS (A2C):   -26.8 %                         2D Strain GLS (A3C):   -23.7 %                         2D Strain GLS (A4C):   -23.9 %                         2D Strain GLS Avg:     -24.8 % RIGHT VENTRICLE RV S prime:     18.60 cm/s TAPSE (M-mode): 1.7 cm LEFT ATRIUM             Index       RIGHT ATRIUM           Index LA diam:        3.00 cm 1.35 cm/m  RA Area:     10.20 cm  LA Vol (A2C):   31.3 ml 14.09 ml/m RA Volume:   19.60 ml  8.82 ml/m LA Vol (A4C):   31.2 ml 14.05 ml/m LA Biplane Vol: 31.3 ml 14.09 ml/m  AORTIC VALVE LVOT Vmax:   99.20 cm/s LVOT Vmean:  68.900 cm/s LVOT VTI:    0.193 m  AORTA Ao Root diam: 3.40 cm Ao Asc diam:  3.10 cm MITRAL VALVE               TRICUSPID VALVE MV Area (PHT): 1.68 cm    TR Peak grad:   17.3 mmHg MV Decel Time: 451 msec    TR Vmax:        208.00 cm/s MV E velocity: 46.50 cm/s MV Pennington velocity: 47.60 cm/s  SHUNTS MV E/Pennington ratio:  0.98        Systemic VTI:  0.19 m                            Systemic Diam: 2.10 cm Buford Dresser MD Electronically signed by Buford Dresser MD Signature Date/Time: 11/06/2020/1:46:24 PM    Final

## 2020-11-19 NOTE — Assessment & Plan Note (Signed)
09/08/2020:Mammogram detected 0.7 cm mass in the right breast 1 o'clock position, right breast biopsy 1: Grade 2 IDC ER 95% PR 95%, Ki-67 30%, HER-2 positive ratio 2.83 T1b N0 stage Ia  10/16/20: Grade 3 IDC with DCIS 0/4 LN  ER 95% PR 95%, Ki-67 30%, HER-2 positive ratio 2.83  Treatment Plan: 1. adjuvant Taxol Herceptin followed by Herceptin maintenance for 1 year plan to start in 3 weeks 2.Adjuvant radiation 3.Followed by adjuvant antiestrogen therapy -------------------------------------------------------------------------------------------------------------------------- Current Treatment: Cycle 2 Taxol Herceptin Chemo Toxicities:  RTC weekly for chemo and q 2 weeks for follow ups

## 2020-11-19 NOTE — Progress Notes (Signed)
Patient Care Team: Seward Carol, MD as PCP - General (Internal Medicine)  DIAGNOSIS:    ICD-10-CM   1. Malignant neoplasm of upper-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, estrogen receptor positive ([place])  C50.211    Z17.0     SUMMARY OF ONCOLOGIC HISTORY: Oncology History  Malignant neoplasm of upper-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, estrogen receptor positive ([place])  09/08/2020 Cancer Staging   Staging form: Breast, AJCC 8th Edition - Clinical stage from 09/08/2020: Stage IA (cT1b, cN0, cM0, G2, ER+, PR+, HER2+) - Signed by Gardenia Phlegm, NP on 09/20/2020 Stage prefix: Initial diagnosis   09/08/2020 Initial Diagnosis   Mammogram detected 0.7 cm mass in the right breast 1 o'clock position, right breast biopsy 1: Grade 2 IDC ER 95% PR 95%, Ki-67 30%, HER-2 positive ratio 2.83   10/16/2020 Surgery   Right lumpectomy [name]): IDC, 1.0cm, grade 3, with high grade DCIS, clear margins, 4 right axillary lymph nodes negative for carcinoma.   10/16/2020 Cancer Staging   Staging form: Breast, AJCC 8th Edition - Pathologic stage from 10/16/2020: Stage IA (pT1b, pN0, cM0, G3, ER+, PR+, HER2+) - Signed by Gardenia Phlegm, NP on 11/01/2020 Histologic grading system: 3 grade system   11/13/2020 -  Chemotherapy    Patient is on Treatment Plan: BREAST PACLITAXEL + TRASTUZUMAB Q7D / TRASTUZUMAB Q21D        CHIEF COMPLIANT: Cycle 2 Taxol Herceptin  INTERVAL HISTORY: Grace Pennington is a 50 y.o. with above-mentioned history of right breast cancer who underwent a right lumpectomy and is currently on adjuvant chemotherapy with Taxol Herceptin. She presents to the clinic today for treatment.  She tolerated first cycle of chemotherapy extremely well.  She did not have any nausea or vomiting.  She did not have any diarrhea or constipation.  She has excellent appetite.  Energy levels are still wonderful.  She denies any fevers or chills.  ALLERGIES:  has No Known  Allergies.  MEDICATIONS:  Current Outpatient Medications  Medication Sig Dispense Refill  . ALPRAZolam (XANAX) 0.25 MG tablet Take 0.25 mg by mouth daily as needed for anxiety.    . ALPRAZolam (XANAX) 0.25 MG tablet TAKE 1 TABLET BY MOUTH ONCE A DAY AS NEEDED 30 tablet 3  . citalopram (CELEXA) 10 MG tablet Take 10 mg by mouth daily.  6  . citalopram (CELEXA) 10 MG tablet TAKE 1 TABLET BY MOUTH ONCE DAILY 90 tablet 3  . citalopram (CELEXA) 10 MG tablet TAKE 1 TABLET BY MOUTH ONCE DAILY 90 tablet 0  . fluticasone (FLONASE) 50 MCG/ACT nasal spray Place 2 sprays into both nostrils daily as needed for allergies.    . hydrochlorothiazide (HYDRODIURIL) 25 MG tablet Take 25 mg by mouth daily.    . hydrochlorothiazide (HYDRODIURIL) 25 MG tablet TAKE 1 TABLET BY MOUTH ONCE DAILY 90 tablet 3  . hydrochlorothiazide (HYDRODIURIL) 25 MG tablet TAKE 1 TABLET BY MOUTH ONCE DAILY 90 tablet 0  . hydroxypropyl methylcellulose / hypromellose (ISOPTO TEARS / GONIOVISC) 2.5 % ophthalmic solution Place 1 drop into both eyes 3 (three) times daily as needed (dry/irritated eyes).    Marland Kitchen lidocaine-prilocaine (EMLA) cream APPLY TO AFFECTED AREA ONCE 30 g 3  . loratadine (CLARITIN) 10 MG tablet Take 10 mg by mouth daily as needed for allergies.    . Multiple Vitamins-Minerals (MULTIVITAMIN WITH MINERALS) tablet Take 1 tablet by mouth daily.    . ondansetron (ZOFRAN) 8 MG tablet TAKE 1 TABLET BY MOUTH 2 TIMES DAILY  AS NEEDED FOR NAUSEA OR VOMITING 30 tablet 1  . pantoprazole (PROTONIX) 40 MG tablet Take 40 mg by mouth daily before breakfast.    . pantoprazole (PROTONIX) 40 MG tablet TAKE 1 TABLET BY MOUTH DAILY 90 tablet 3  . prochlorperazine (COMPAZINE) 10 MG tablet TAKE 1 TABLET BY MOUTH EVERY 6 HOURS AS NEEDED FOR NAUSEA OR VOMITING 30 tablet 1  . rosuvastatin (CRESTOR) 10 MG tablet Take 10 mg by mouth daily.    . rosuvastatin (CRESTOR) 10 MG tablet TAKE 1 TABLET BY MOUTH ONCE A DAY 90 tablet 3  . spironolactone  (ALDACTONE) 50 MG tablet Take 50 mg by mouth daily.    Marland Kitchen spironolactone (ALDACTONE) 50 MG tablet TAKE 1 TABLET BY MOUTH 2 TIMES DAILY 60 tablet 12   No current facility-administered medications for this visit.    PHYSICAL EXAMINATION: ECOG PERFORMANCE STATUS: 0 - Asymptomatic  There were no vitals filed for this visit. There were no vitals filed for this visit.  LABORATORY DATA:  I have reviewed the data as listed CMP Latest Ref Rng & Units 11/13/2020 10/11/2020 08/13/2018  Glucose 70 - 99 mg/dL 130(H) 122(H) 96  BUN 6 - 20 mg/dL 14 13 19   Creatinine 0.44 - 1.00 mg/dL 1.01(H) 1.00 0.95  Sodium 135 - 145 mmol/L 139 138 143  Potassium 3.5 - 5.1 mmol/L 3.7 3.7 4.1  Chloride 98 - 111 mmol/L 103 104 102  CO2 22 - 32 mmol/L 26 25 25   Calcium 8.9 - 10.3 mg/dL 9.6 9.2 9.6  Total Protein 6.5 - 8.1 g/dL 7.7 - 7.3  Total Bilirubin 0.3 - 1.2 mg/dL 0.5 - 0.2  Alkaline Phos 38 - 126 U/L 75 - 77  AST 15 - 41 U/L 27 - 20  ALT 0 - 44 U/L 36 - 28    Lab Results  Component Value Date   WBC 5.6 11/13/2020   HGB 14.7 11/13/2020   HCT 43.2 11/13/2020   MCV 87.1 11/13/2020   PLT 237 11/13/2020   NEUTROABS 2.4 11/13/2020    ASSESSMENT & PLAN:  Malignant neoplasm of upper-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, estrogen receptor positive ([facility]) 09/08/2020:Mammogram detected 0.7 cm mass in the right breast 1 o'clock position, right breast biopsy 1: Grade 2 IDC ER 95% PR 95%, Ki-67 30%, HER-2 positive ratio 2.83 T1b N0 stage Ia  10/16/20: Grade 3 IDC with DCIS 0/4 LN  ER 95% PR 95%, Ki-67 30%, HER-2 positive ratio 2.83  Treatment Plan: 1. adjuvant Taxol Herceptin followed by Herceptin maintenance for 1 year plan to start in 3 weeks 2.Adjuvant radiation 3.Followed by adjuvant antiestrogen therapy -------------------------------------------------------------------------------------------------------------------------- Current Treatment: Cycle 2 Taxol Herceptin Chemo Toxicities: Slight  leukopenia: ANC today is 1.4, okay to proceed with treatment. I discussed with her that there is a possibility that we may lower the dosage of future chemotherapies.  RTC weekly for chemo and q 2 weeks for follow ups   No orders of the defined types were placed in this encounter.  The patient has a good understanding of the overall plan. she agrees with it. she will call with any problems that may develop before the next visit here.  Total time spent: 30 mins including face to face time and time spent for planning, charting and coordination of care  Rulon Eisenmenger, MD, MPH 11/20/2020  I, Molly Dorshimer, am acting as scribe for Dr. Nicholas Lose.  I have reviewed the above documentation for accuracy and completeness, and I agree with the above.

## 2020-11-20 ENCOUNTER — Inpatient Hospital Stay (HOSPITAL_BASED_OUTPATIENT_CLINIC_OR_DEPARTMENT_OTHER): Payer: No Typology Code available for payment source | Admitting: Hematology and Oncology

## 2020-11-20 ENCOUNTER — Other Ambulatory Visit: Payer: Self-pay

## 2020-11-20 ENCOUNTER — Inpatient Hospital Stay: Payer: No Typology Code available for payment source

## 2020-11-20 DIAGNOSIS — Z170 Estrogen receptor positive status [ER+]: Secondary | ICD-10-CM | POA: Diagnosis not present

## 2020-11-20 DIAGNOSIS — Z95828 Presence of other vascular implants and grafts: Secondary | ICD-10-CM

## 2020-11-20 DIAGNOSIS — C50211 Malignant neoplasm of upper-inner quadrant of right female breast: Secondary | ICD-10-CM

## 2020-11-20 DIAGNOSIS — Z5111 Encounter for antineoplastic chemotherapy: Secondary | ICD-10-CM | POA: Diagnosis not present

## 2020-11-20 HISTORY — DX: Presence of other vascular implants and grafts: Z95.828

## 2020-11-20 LAB — CMP (CANCER CENTER ONLY)
ALT: 41 U/L (ref 0–44)
AST: 25 U/L (ref 15–41)
Albumin: 3.9 g/dL (ref 3.5–5.0)
Alkaline Phosphatase: 68 U/L (ref 38–126)
Anion gap: 9 (ref 5–15)
BUN: 20 mg/dL (ref 6–20)
CO2: 27 mmol/L (ref 22–32)
Calcium: 9.4 mg/dL (ref 8.9–10.3)
Chloride: 102 mmol/L (ref 98–111)
Creatinine: 1.04 mg/dL — ABNORMAL HIGH (ref 0.44–1.00)
GFR, Estimated: >60 mL/min (ref >60)
Glucose, Bld: 124 mg/dL — ABNORMAL HIGH (ref 70–99)
Potassium: 3.9 mmol/L (ref 3.5–5.1)
Sodium: 138 mmol/L (ref 135–145)
Total Bilirubin: 0.5 mg/dL (ref 0.3–1.2)
Total Protein: 7.5 g/dL (ref 6.5–8.1)

## 2020-11-20 LAB — CBC WITH DIFFERENTIAL (CANCER CENTER ONLY)
Abs Immature Granulocytes: 0.01 10*3/uL (ref 0.00–0.07)
Basophils Absolute: 0.1 10*3/uL (ref 0.0–0.1)
Basophils Relative: 1 %
Eosinophils Absolute: 0.2 10*3/uL (ref 0.0–0.5)
Eosinophils Relative: 6 %
HCT: 41.1 % (ref 36.0–46.0)
Hemoglobin: 13.8 g/dL (ref 12.0–15.0)
Immature Granulocytes: 0 %
Lymphocytes Relative: 51 %
Lymphs Abs: 1.9 10*3/uL (ref 0.7–4.0)
MCH: 29.7 pg (ref 26.0–34.0)
MCHC: 33.6 g/dL (ref 30.0–36.0)
MCV: 88.6 fL (ref 80.0–100.0)
Monocytes Absolute: 0.2 10*3/uL (ref 0.1–1.0)
Monocytes Relative: 5 %
Neutro Abs: 1.4 10*3/uL — ABNORMAL LOW (ref 1.7–7.7)
Neutrophils Relative %: 37 %
Platelet Count: 240 10*3/uL (ref 150–400)
RBC: 4.64 MIL/uL (ref 3.87–5.11)
RDW: 14.1 % (ref 11.5–15.5)
WBC Count: 3.8 10*3/uL — ABNORMAL LOW (ref 4.0–10.5)
nRBC: 0 % (ref 0.0–0.2)

## 2020-11-20 MED ORDER — ACETAMINOPHEN 325 MG PO TABS
ORAL_TABLET | ORAL | Status: AC
  Filled 2020-11-20: qty 2

## 2020-11-20 MED ORDER — DIPHENHYDRAMINE HCL 50 MG/ML IJ SOLN
25.0000 mg | Freq: Once | INTRAMUSCULAR | Status: AC
  Administered 2020-11-20: 25 mg via INTRAVENOUS

## 2020-11-20 MED ORDER — FAMOTIDINE IN NACL 20-0.9 MG/50ML-% IV SOLN
INTRAVENOUS | Status: AC
  Filled 2020-11-20: qty 50

## 2020-11-20 MED ORDER — SODIUM CHLORIDE 0.9% FLUSH
10.0000 mL | INTRAVENOUS | Status: DC | PRN
  Administered 2020-11-20: 10 mL
  Filled 2020-11-20: qty 10

## 2020-11-20 MED ORDER — SODIUM CHLORIDE 0.9 % IV SOLN
80.0000 mg/m2 | Freq: Once | INTRAVENOUS | Status: AC
  Administered 2020-11-20: 186 mg via INTRAVENOUS
  Filled 2020-11-20: qty 31

## 2020-11-20 MED ORDER — TRASTUZUMAB-DKST CHEMO 150 MG IV SOLR
2.0000 mg/kg | Freq: Once | INTRAVENOUS | Status: AC
  Administered 2020-11-20: 231 mg via INTRAVENOUS
  Filled 2020-11-20: qty 11

## 2020-11-20 MED ORDER — SODIUM CHLORIDE 0.9 % IV SOLN
Freq: Once | INTRAVENOUS | Status: AC
  Filled 2020-11-20: qty 250

## 2020-11-20 MED ORDER — HEPARIN SOD (PORK) LOCK FLUSH 100 UNIT/ML IV SOLN
500.0000 [IU] | Freq: Once | INTRAVENOUS | Status: AC | PRN
  Administered 2020-11-20: 500 [IU]
  Filled 2020-11-20: qty 5

## 2020-11-20 MED ORDER — FAMOTIDINE IN NACL 20-0.9 MG/50ML-% IV SOLN
20.0000 mg | Freq: Once | INTRAVENOUS | Status: AC
  Administered 2020-11-20: 20 mg via INTRAVENOUS

## 2020-11-20 MED ORDER — SODIUM CHLORIDE 0.9% FLUSH
10.0000 mL | Freq: Once | INTRAVENOUS | Status: AC
  Administered 2020-11-20: 10 mL
  Filled 2020-11-20: qty 10

## 2020-11-20 MED ORDER — SODIUM CHLORIDE 0.9 % IV SOLN
10.0000 mg | Freq: Once | INTRAVENOUS | Status: AC
  Administered 2020-11-20: 10 mg via INTRAVENOUS
  Filled 2020-11-20: qty 10

## 2020-11-20 MED ORDER — ACETAMINOPHEN 325 MG PO TABS
650.0000 mg | ORAL_TABLET | Freq: Once | ORAL | Status: AC
  Administered 2020-11-20: 650 mg via ORAL

## 2020-11-20 MED ORDER — DIPHENHYDRAMINE HCL 50 MG/ML IJ SOLN
INTRAMUSCULAR | Status: AC
  Filled 2020-11-20: qty 1

## 2020-11-20 NOTE — Patient Instructions (Signed)
[facility]  Discharge Instructions: Thank you for choosing [facility] to provide your oncology and hematology care.   If you have a lab appointment with the Cancer Center, please go directly to the Cancer Center and check in at the registration area.   Wear comfortable clothing and clothing appropriate for easy access to any Portacath or PICC line.   We strive to give you quality time with your provider. You may need to reschedule your appointment if you arrive late (15 or more minutes).  Arriving late affects you and other patients whose appointments are after yours.  Also, if you miss three or more appointments without notifying the office, you may be dismissed from the clinic at the provider's discretion.      For prescription refill requests, have your pharmacy contact our office and allow 72 hours for refills to be completed.    Today you received the following chemotherapy and/or immunotherapy agents trastuzumab, paclitaxel      To help prevent nausea and vomiting after your treatment, we encourage you to take your nausea medication as directed.  BELOW ARE SYMPTOMS THAT SHOULD BE REPORTED IMMEDIATELY: *FEVER GREATER THAN 100.4 F (38 C) OR HIGHER *CHILLS OR SWEATING *NAUSEA AND VOMITING THAT IS NOT CONTROLLED WITH YOUR NAUSEA MEDICATION *UNUSUAL SHORTNESS OF BREATH *UNUSUAL BRUISING OR BLEEDING *URINARY PROBLEMS (pain or burning when urinating, or frequent urination) *BOWEL PROBLEMS (unusual diarrhea, constipation, pain near the anus) TENDERNESS IN MOUTH AND THROAT WITH OR WITHOUT PRESENCE OF ULCERS (sore throat, sores in mouth, or a toothache) UNUSUAL RASH, SWELLING OR PAIN  UNUSUAL VAGINAL DISCHARGE OR ITCHING   Items with * indicate a potential emergency and should be followed up as soon as possible or go to the Emergency Department if any problems should occur.  Please show the CHEMOTHERAPY ALERT CARD or IMMUNOTHERAPY ALERT CARD at  check-in to the Emergency Department and triage nurse.  Should you have questions after your visit or need to cancel or reschedule your appointment, please contact [facility]  Dept: [phone]  and follow the prompts.  Office hours are 8:00 a.m. to 4:30 p.m. Monday - Friday. Please note that voicemails left after 4:00 p.m. may not be returned until the following business day.  We are closed weekends and major holidays. You have access to a nurse at all times for urgent questions. Please call the main number to the clinic Dept: [phone] and follow the prompts.   For any non-urgent questions, you may also contact your provider using MyChart. We now offer e-Visits for anyone 18 and older to request care online for non-urgent symptoms. For details visit [email or web address].   Also download the MyChart app! Go to the app store, search "MyChart", open the app, select , and log in with your MyChart username and password.  Due to Covid, a mask is required upon entering the hospital/clinic. If you do not have a mask, one will be given to you upon arrival. For doctor visits, patients may have 1 support person aged 18 or older with them. For treatment visits, patients cannot have anyone with them due to current Covid guidelines and our immunocompromised population.   

## 2020-11-21 ENCOUNTER — Ambulatory Visit: Payer: No Typology Code available for payment source

## 2020-11-21 DIAGNOSIS — M25611 Stiffness of right shoulder, not elsewhere classified: Secondary | ICD-10-CM | POA: Diagnosis present

## 2020-11-21 DIAGNOSIS — R293 Abnormal posture: Secondary | ICD-10-CM

## 2020-11-21 DIAGNOSIS — Z483 Aftercare following surgery for neoplasm: Secondary | ICD-10-CM

## 2020-11-21 DIAGNOSIS — Z9189 Other specified personal risk factors, not elsewhere classified: Secondary | ICD-10-CM

## 2020-11-21 NOTE — Therapy (Addendum)
[place], [place], [address] Phone: [phone]   Fax:  [phone]  Physical Therapy Treatment  Patient Details  Name: ERILYN PEARMAN MRN: [id number] Date of Birth: 03/04/1971 Referring Provider (PT): Joaquin Courts Date: 11/21/2020   PT End of Session - 11/21/20 1007    Visit Number 3    Number of Visits 6    Date for PT Re-Evaluation 12/19/20    PT Start Time 0906    PT Stop Time 1004    PT Time Calculation (min) 58 min    Activity Tolerance Patient tolerated treatment well    Behavior During Therapy [facility] for tasks assessed/performed           Past Medical History:  Diagnosis Date  . Anxiety   . Breast cancer in female [facility])    Right  . Depression   . GERD (gastroesophageal reflux disease)   . Hypertension   . Palpitations   . Sleep apnea     Past Surgical History:  Procedure Laterality Date  . BREAST LUMPECTOMY WITH RADIOACTIVE SEED AND SENTINEL LYMPH NODE BIOPSY Right 10/16/2020   Procedure: RIGHT BREAST LUMPECTOMY WITH RADIOACTIVE SEED AND SENTINEL LYMPH NODE BIOPSY;  Surgeon: [name], MD;  Location: [facility];  Service: General;  Laterality: Right;  . PORTACATH PLACEMENT Left 10/16/2020   Procedure: INSERTION PORT-A-CATH;  Surgeon: [name], MD;  Location: [facility];  Service: General;  Laterality: Left;  . TUBAL LIGATION      There were no vitals filed for this visit.   Subjective Assessment - 11/21/20 0906    Subjective I've been doing the exercises Marcene Brawn gave me and massaging my upper arm and I can tell my arm is already feeling better. I don't feel the pulling all the way into my upper arm like I was. I can just still feel the cords in my armpit.    Pertinent History Rtlumpectomy and SLNB on 10/16/20 with Dr. [name] 0/4 nodes positive due to ER+ breast cancer with chemotherapy and radiation to follow.  HTN    Currently in Pain? No/denies                      Manual Therapy   Manual Therapy Myofascial release;Passive ROM    Myofascial Release In Supine to Rt axilla at areas of cording, 1 large pop felt with less fascial restriction noted after   Passive ROM In Supine to Rt shoulder into flexion, abduction and D2 to pts available end motions                            PT Long Term Goals - 11/21/20 1227      PT LONG TERM GOAL #1   Title Pt will return to baseline AROM without pull due to cording    Baseline pt has returned to baseline AROM but has pull with overhead flexion due to cording; goal met as pt does not feel limited by pull of cording after todays session - 11/21/20    Status Achieved      PT LONG TERM GOAL #2   Title Pt will be ind with HEP for continued UE strength and mobility throughout radiation and after    Baseline Pt is independent with HEP for flexibility but did not wish to cont PT so did not get to progress to strengthening - 11/21/20    Status  Partially Met                 Plan - 11/21/20 1008    Clinical Impression Statement Pt returns to physical therapy reporting she has been consistent with her HEP issued at last session. Focused on manual therapy to Rt axillary cording which decreased very well by end of session and 1 large pop felt towards end of session with even more improvement noted of cording after this. At end of session before leaving pt reported not wanting to return for further treatments reporting she feels she is ready to cont independently at home. Pt met ROM goal and partially met HEP goal (did not get to progress to strengthening as pt does not plan on returning).    Personal Factors and Comorbidities Fitness    Examination-Activity Limitations Reach Overhead    Examination-Participation Restrictions Meal Prep;Yard Work    Stability/Clinical Decision Making Stable/Uncomplicated    Rehab Potential Excellent    PT Frequency 1x / week    PT Duration 6 weeks    PT  Treatment/Interventions Therapeutic exercise;Patient/family education;Manual lymph drainage;ADLs/Self Care Home Management;Manual techniques;Passive range of motion    PT Next Visit Plan Cont every 3 month L-Dex screen for up to 2 years after her SLNB which will be around 10/16/21. Pt placed on hold in case she needs to resume physical therapy after radiation.    PT Home Exercise Plan post op    Consulted and Agree with Plan of Care Patient           Patient will benefit from skilled therapeutic intervention in order to improve the following deficits and impairments:  Postural dysfunction,Decreased safety awareness,Decreased range of motion,Impaired flexibility  Visit Diagnosis: Aftercare following surgery for neoplasm  Stiffness of right shoulder, not elsewhere classified  At risk for lymphedema  Abnormal posture     Problem List Patient Active Problem List   Diagnosis Date Noted  . Port-A-Cath in place 11/20/2020  . Malignant neoplasm of upper-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, estrogen receptor positive ([place]) 09/20/2020  . Precordial pain 04/14/2014  . Essential hypertension, benign 08/03/2013  . Family history of ischemic heart disease 08/03/2013    [name], PTA 11/21/2020, 12:40 PM  [place], [place], [address] Phone: [phone]   Fax:  [phone]  Name: SADAKO CEGIELSKI MRN: [id number] Date of Birth: May 23, 1971

## 2020-11-27 ENCOUNTER — Other Ambulatory Visit: Payer: No Typology Code available for payment source

## 2020-11-27 ENCOUNTER — Other Ambulatory Visit: Payer: Self-pay | Admitting: Hematology and Oncology

## 2020-11-27 ENCOUNTER — Ambulatory Visit: Payer: No Typology Code available for payment source | Admitting: Hematology and Oncology

## 2020-11-27 ENCOUNTER — Inpatient Hospital Stay: Payer: No Typology Code available for payment source

## 2020-11-27 ENCOUNTER — Other Ambulatory Visit: Payer: Self-pay

## 2020-11-27 ENCOUNTER — Inpatient Hospital Stay: Payer: No Typology Code available for payment source | Attending: Hematology and Oncology

## 2020-11-27 VITALS — BP 111/75 | HR 71 | Temp 98.5°F | Resp 16

## 2020-11-27 DIAGNOSIS — Z5111 Encounter for antineoplastic chemotherapy: Secondary | ICD-10-CM | POA: Insufficient documentation

## 2020-11-27 DIAGNOSIS — Z170 Estrogen receptor positive status [ER+]: Secondary | ICD-10-CM | POA: Diagnosis not present

## 2020-11-27 DIAGNOSIS — C50211 Malignant neoplasm of upper-inner quadrant of right female breast: Secondary | ICD-10-CM | POA: Insufficient documentation

## 2020-11-27 DIAGNOSIS — Z5112 Encounter for antineoplastic immunotherapy: Secondary | ICD-10-CM | POA: Insufficient documentation

## 2020-11-27 DIAGNOSIS — Z79899 Other long term (current) drug therapy: Secondary | ICD-10-CM | POA: Insufficient documentation

## 2020-11-27 DIAGNOSIS — Z95828 Presence of other vascular implants and grafts: Secondary | ICD-10-CM

## 2020-11-27 LAB — CBC WITH DIFFERENTIAL (CANCER CENTER ONLY)
Abs Immature Granulocytes: 0.01 10*3/uL (ref 0.00–0.07)
Basophils Absolute: 0 10*3/uL (ref 0.0–0.1)
Basophils Relative: 1 %
Eosinophils Absolute: 0.1 10*3/uL (ref 0.0–0.5)
Eosinophils Relative: 3 %
HCT: 41.5 % (ref 36.0–46.0)
Hemoglobin: 13.6 g/dL (ref 12.0–15.0)
Immature Granulocytes: 0 %
Lymphocytes Relative: 54 %
Lymphs Abs: 2 10*3/uL (ref 0.7–4.0)
MCH: 29.1 pg (ref 26.0–34.0)
MCHC: 32.8 g/dL (ref 30.0–36.0)
MCV: 88.7 fL (ref 80.0–100.0)
Monocytes Absolute: 0.2 10*3/uL (ref 0.1–1.0)
Monocytes Relative: 6 %
Neutro Abs: 1.3 10*3/uL — ABNORMAL LOW (ref 1.7–7.7)
Neutrophils Relative %: 36 %
Platelet Count: 272 10*3/uL (ref 150–400)
RBC: 4.68 MIL/uL (ref 3.87–5.11)
RDW: 14.7 % (ref 11.5–15.5)
WBC Count: 3.6 10*3/uL — ABNORMAL LOW (ref 4.0–10.5)
nRBC: 0 % (ref 0.0–0.2)

## 2020-11-27 LAB — CMP (CANCER CENTER ONLY)
ALT: 58 U/L — ABNORMAL HIGH (ref 0–44)
AST: 35 U/L (ref 15–41)
Albumin: 3.9 g/dL (ref 3.5–5.0)
Alkaline Phosphatase: 73 U/L (ref 38–126)
Anion gap: 8 (ref 5–15)
BUN: 15 mg/dL (ref 6–20)
CO2: 29 mmol/L (ref 22–32)
Calcium: 9.4 mg/dL (ref 8.9–10.3)
Chloride: 102 mmol/L (ref 98–111)
Creatinine: 1 mg/dL (ref 0.44–1.00)
GFR, Estimated: >60 mL/min (ref >60)
Glucose, Bld: 116 mg/dL — ABNORMAL HIGH (ref 70–99)
Potassium: 4 mmol/L (ref 3.5–5.1)
Sodium: 139 mmol/L (ref 135–145)
Total Bilirubin: 0.3 mg/dL (ref 0.3–1.2)
Total Protein: 7.6 g/dL (ref 6.5–8.1)

## 2020-11-27 MED ORDER — DIPHENHYDRAMINE HCL 50 MG/ML IJ SOLN
25.0000 mg | Freq: Once | INTRAMUSCULAR | Status: AC
  Administered 2020-11-27: 25 mg via INTRAVENOUS

## 2020-11-27 MED ORDER — SODIUM CHLORIDE 0.9% FLUSH
10.0000 mL | INTRAVENOUS | Status: DC | PRN
  Administered 2020-11-27: 10 mL
  Filled 2020-11-27: qty 10

## 2020-11-27 MED ORDER — ACETAMINOPHEN 325 MG PO TABS
650.0000 mg | ORAL_TABLET | Freq: Once | ORAL | Status: AC
Start: 2020-11-27 — End: 2020-11-27
  Administered 2020-11-27: 650 mg via ORAL

## 2020-11-27 MED ORDER — FAMOTIDINE IN NACL 20-0.9 MG/50ML-% IV SOLN
INTRAVENOUS | Status: AC
  Filled 2020-11-27: qty 50

## 2020-11-27 MED ORDER — SODIUM CHLORIDE 0.9% FLUSH
10.0000 mL | Freq: Once | INTRAVENOUS | Status: AC
  Administered 2020-11-27: 10 mL
  Filled 2020-11-27: qty 10

## 2020-11-27 MED ORDER — FAMOTIDINE IN NACL 20-0.9 MG/50ML-% IV SOLN
20.0000 mg | Freq: Once | INTRAVENOUS | Status: AC
  Administered 2020-11-27: 20 mg via INTRAVENOUS

## 2020-11-27 MED ORDER — ACETAMINOPHEN 325 MG PO TABS
ORAL_TABLET | ORAL | Status: AC
  Filled 2020-11-27: qty 2

## 2020-11-27 MED ORDER — SODIUM CHLORIDE 0.9 % IV SOLN
Freq: Once | INTRAVENOUS | Status: AC
  Filled 2020-11-27: qty 250

## 2020-11-27 MED ORDER — DIPHENHYDRAMINE HCL 50 MG/ML IJ SOLN
INTRAMUSCULAR | Status: AC
  Filled 2020-11-27: qty 1

## 2020-11-27 MED ORDER — PACLITAXEL CHEMO INJECTION 300 MG/50ML
65.0000 mg/m2 | Freq: Once | INTRAVENOUS | Status: AC
Start: 2020-11-27 — End: 2020-11-27
  Administered 2020-11-27: 150 mg via INTRAVENOUS
  Filled 2020-11-27: qty 25

## 2020-11-27 MED ORDER — HEPARIN SOD (PORK) LOCK FLUSH 100 UNIT/ML IV SOLN
500.0000 [IU] | Freq: Once | INTRAVENOUS | Status: AC | PRN
  Administered 2020-11-27: 500 [IU]
  Filled 2020-11-27: qty 5

## 2020-11-27 MED ORDER — SODIUM CHLORIDE 0.9 % IV SOLN
10.0000 mg | Freq: Once | INTRAVENOUS | Status: AC
  Administered 2020-11-27: 10 mg via INTRAVENOUS
  Filled 2020-11-27: qty 10

## 2020-11-27 MED ORDER — TRASTUZUMAB-DKST CHEMO 150 MG IV SOLR
2.0000 mg/kg | Freq: Once | INTRAVENOUS | Status: AC
  Administered 2020-11-27: 231 mg via INTRAVENOUS
  Filled 2020-11-27: qty 11

## 2020-11-27 NOTE — Patient Instructions (Signed)
Implanted Port Insertion, Care After This sheet gives you information about how to care for yourself after your procedure. Your health care provider may also give you more specific instructions. If you have problems or questions, contact your health care provider. What can I expect after the procedure? After the procedure, it is common to have:  Discomfort at the port insertion site.  Bruising on the skin over the port. This should improve over 3-4 days. Follow these instructions at home: Port care  After your port is placed, you will get a manufacturer's information card. The card has information about your port. Keep this card with you at all times.  Take care of the port as told by your health care provider. Ask your health care provider if you or a family member can get training for taking care of the port at home. A home health care nurse may also take care of the port.  Make sure to remember what type of port you have. Incision care  Follow instructions from your health care provider about how to take care of your port insertion site. Make sure you: ? Wash your hands with soap and water before and after you change your bandage (dressing). If soap and water are not available, use hand sanitizer. ? Change your dressing as told by your health care provider. ? Leave stitches (sutures), skin glue, or adhesive strips in place. These skin closures may need to stay in place for 2 weeks or longer. If adhesive strip edges start to loosen and curl up, you may trim the loose edges. Do not remove adhesive strips completely unless your health care provider tells you to do that.  Check your port insertion site every day for signs of infection. Check for: ? Redness, swelling, or pain. ? Fluid or blood. ? Warmth. ? Pus or a bad smell.      Activity  Return to your normal activities as told by your health care provider. Ask your health care provider what activities are safe for you.  Do not  lift anything that is heavier than 10 lb (4.5 kg), or the limit that you are told, until your health care provider says that it is safe. General instructions  Take over-the-counter and prescription medicines only as told by your health care provider.  Do not take baths, swim, or use a hot tub until your health care provider approves. Ask your health care provider if you may take showers. You may only be allowed to take sponge baths.  Do not drive for 24 hours if you were given a sedative during your procedure.  Wear a medical alert bracelet in case of an emergency. This will tell any health care providers that you have a port.  Keep all follow-up visits as told by your health care provider. This is important. Contact a health care provider if:  You cannot flush your port with saline as directed, or you cannot draw blood from the port.  You have a fever or chills.  You have redness, swelling, or pain around your port insertion site.  You have fluid or blood coming from your port insertion site.  Your port insertion site feels warm to the touch.  You have pus or a bad smell coming from the port insertion site. Get help right away if:  You have chest pain or shortness of breath.  You have bleeding from your port that you cannot control. Summary  Take care of the port as told by your   health care provider. Keep the manufacturer's information card with you at all times.  Change your dressing as told by your health care provider.  Contact a health care provider if you have a fever or chills or if you have redness, swelling, or pain around your port insertion site.  Keep all follow-up visits as told by your health care provider. This information is not intended to replace advice given to you by your health care provider. Make sure you discuss any questions you have with your health care provider. Document Revised: 02/10/2018 Document Reviewed: 02/10/2018 Elsevier Patient Education   2021 Elsevier Inc.  

## 2020-11-27 NOTE — Progress Notes (Signed)
Dr. Lindi Adie aware of ANC 1.3. Per Dr. Lindi Adie, ok to treat. Will dose reduce paclitaxel.

## 2020-11-27 NOTE — Patient Instructions (Signed)
[facility]   Discharge Instructions: Thank you for choosing [facility] to provide your oncology and hematology care.   If you have a lab appointment with the Cancer Center, please go directly to the Cancer Center and check in at the registration area.   Wear comfortable clothing and clothing appropriate for easy access to any Portacath or PICC line.   We strive to give you quality time with your provider. You may need to reschedule your appointment if you arrive late (15 or more minutes).  Arriving late affects you and other patients whose appointments are after yours.  Also, if you miss three or more appointments without notifying the office, you may be dismissed from the clinic at the provider's discretion.      For prescription refill requests, have your pharmacy contact our office and allow 72 hours for refills to be completed.    Today you received the following chemotherapy and/or immunotherapy agents: trastuzumab and paclitaxel.      To help prevent nausea and vomiting after your treatment, we encourage you to take your nausea medication as directed.  BELOW ARE SYMPTOMS THAT SHOULD BE REPORTED IMMEDIATELY: *FEVER GREATER THAN 100.4 F (38 C) OR HIGHER *CHILLS OR SWEATING *NAUSEA AND VOMITING THAT IS NOT CONTROLLED WITH YOUR NAUSEA MEDICATION *UNUSUAL SHORTNESS OF BREATH *UNUSUAL BRUISING OR BLEEDING *URINARY PROBLEMS (pain or burning when urinating, or frequent urination) *BOWEL PROBLEMS (unusual diarrhea, constipation, pain near the anus) TENDERNESS IN MOUTH AND THROAT WITH OR WITHOUT PRESENCE OF ULCERS (sore throat, sores in mouth, or a toothache) UNUSUAL RASH, SWELLING OR PAIN  UNUSUAL VAGINAL DISCHARGE OR ITCHING   Items with * indicate a potential emergency and should be followed up as soon as possible or go to the Emergency Department if any problems should occur.  Please show the CHEMOTHERAPY ALERT CARD or IMMUNOTHERAPY ALERT  CARD at check-in to the Emergency Department and triage nurse.  Should you have questions after your visit or need to cancel or reschedule your appointment, please contact [facility]  Dept: [phone]  and follow the prompts.  Office hours are 8:00 a.m. to 4:30 p.m. Monday - Friday. Please note that voicemails left after 4:00 p.m. may not be returned until the following business day.  We are closed weekends and major holidays. You have access to a nurse at all times for urgent questions. Please call the main number to the clinic Dept: [phone] and follow the prompts.   For any non-urgent questions, you may also contact your provider using MyChart. We now offer e-Visits for anyone 18 and older to request care online for non-urgent symptoms. For details visit [email or web address].   Also download the MyChart app! Go to the app store, search "MyChart", open the app, select [facility], and log in with your MyChart username and password.  Due to Covid, a mask is required upon entering the hospital/clinic. If you do not have a mask, one will be given to you upon arrival. For doctor visits, patients may have 1 support person aged 18 or older with them. For treatment visits, patients cannot have anyone with them due to current Covid guidelines and our immunocompromised population.   

## 2020-11-28 ENCOUNTER — Ambulatory Visit: Payer: No Typology Code available for payment source | Attending: General Surgery

## 2020-11-28 DIAGNOSIS — Z483 Aftercare following surgery for neoplasm: Secondary | ICD-10-CM | POA: Insufficient documentation

## 2020-11-28 DIAGNOSIS — M25611 Stiffness of right shoulder, not elsewhere classified: Secondary | ICD-10-CM | POA: Insufficient documentation

## 2020-11-28 DIAGNOSIS — Z9189 Other specified personal risk factors, not elsewhere classified: Secondary | ICD-10-CM | POA: Diagnosis present

## 2020-11-28 DIAGNOSIS — R293 Abnormal posture: Secondary | ICD-10-CM | POA: Insufficient documentation

## 2020-11-28 NOTE — Patient Instructions (Signed)
Over Head Pull: Narrow and Wide Grip   Cancer Rehab [phone]   On back, knees bent, feet flat, band across thighs, elbows straight but relaxed. Pull hands apart (start). Keeping elbows straight, bring arms up and over head, hands toward floor. Keep pull steady on band. Hold momentarily. Return slowly, keeping pull steady, back to start. Then do same with a wider grip on the band (past shoulder width) Repeat _5-10__ times. Band color __yellow____   Side Pull: Double Arm   On back, knees bent, feet flat. Arms perpendicular to body, shoulder level, elbows straight but relaxed. Pull arms out to sides, elbows straight. Resistance band comes across collarbones, hands toward floor. Hold momentarily. Slowly return to starting position. Repeat _5-10__ times. Band color _red____   Sword   On back, knees bent, feet flat, left hand on left hip, right hand above left. Pull right arm DIAGONALLY (hip to shoulder) across chest. Bring right arm along head toward floor. Hold momentarily. Slowly return to starting position. Repeat _5-10__ times. Do with left arm. Band color _red_____   Shoulder Rotation: Double Arm   On back, knees bent, feet flat, elbows tucked at sides, bent 90, hands palms up. Pull hands apart and down toward floor, keeping elbows near sides. Hold momentarily. Slowly return to starting position. Repeat _5-10__ times. Band color __red___

## 2020-11-28 NOTE — Therapy (Signed)
[place], [place], [address] Phone: [phone]   Fax:  [phone]  Physical Therapy Treatment  Patient Details  Name: Grace Pennington MRN: [id number] Date of Birth: 1971-05-10 Referring Provider (PT): Joaquin Courts Date: 11/28/2020   PT End of Session - 11/28/20 1003    Visit Number 4    Number of Visits 6    Date for PT Re-Evaluation 12/19/20    PT Start Time 0908    PT Stop Time 1004    PT Time Calculation (min) 56 min    Activity Tolerance Patient tolerated treatment well    Behavior During Therapy [facility] for tasks assessed/performed           Past Medical History:  Diagnosis Date  . Anxiety   . Breast cancer in female [facility])    Right  . Depression   . GERD (gastroesophageal reflux disease)   . Hypertension   . Palpitations   . Sleep apnea     Past Surgical History:  Procedure Laterality Date  . BREAST LUMPECTOMY WITH RADIOACTIVE SEED AND SENTINEL LYMPH NODE BIOPSY Right 10/16/2020   Procedure: RIGHT BREAST LUMPECTOMY WITH RADIOACTIVE SEED AND SENTINEL LYMPH NODE BIOPSY;  Surgeon: [name], MD;  Location: [facility];  Service: General;  Laterality: Right;  . PORTACATH PLACEMENT Left 10/16/2020   Procedure: INSERTION PORT-A-CATH;  Surgeon: [name], MD;  Location: [facility];  Service: General;  Laterality: Left;  . TUBAL LIGATION      There were no vitals filed for this visit.   Subjective Assessment - 11/28/20 0910    Subjective I do want to be placed on hold after this session, I already had this one still scheduled and wanted to keep it because I noticed some pull in my axilla and elbow after last session from the cording.    Pertinent History Rtlumpectomy and SLNB on 10/16/20 with Dr. [name] 0/4 nodes positive due to ER+ breast cancer with chemotherapy and radiation to follow.  HTN    Patient Stated Goals want to get rid of Lt axilla tightness    Currently in Pain? No/denies                              [facility] Adult PT Treatment/Exercise - 11/28/20 0001      Shoulder Exercises: Supine   Horizontal ABduction Strengthening;Both;5 reps;Theraband    Theraband Level (Shoulder Horizontal ABduction) Level 2 (Red)    Horizontal ABduction Limitations Pt returned therapist demo for each exercise    External Rotation Strengthening;Both;5 reps;Theraband    Theraband Level (Shoulder External Rotation) Level 2 (Red)    Flexion Strengthening;Both;5 reps;Theraband   Narrow and Wide Grip, 5 reps each   Theraband Level (Shoulder Flexion) Level 2 (Red)    Diagonals Strengthening;Right;Left;5 reps;Theraband    Theraband Level (Shoulder Diagonals) Level 2 (Red)      Manual Therapy   Manual Therapy Myofascial release;Passive ROM    Myofascial Release In Supine to Rt axilla at areas of cording, this much improved from last week.    Passive ROM In Supine to Rt shoulder into flexion, abduction and D2 to pts available end motions                  PT Education - 11/28/20 1002    Education Details Supien scapular series with red therband    Person(s) Educated Patient  Methods Explanation;Demonstration;Handout    Comprehension Returned demonstration               PT Long Term Goals - 11/21/20 1227      PT LONG TERM GOAL #1   Title Pt will return to baseline AROM without pull due to cording    Baseline pt has returned to baseline AROM but has pull with overhead flexion due to cording; goal met as pt does not feel limited by pull of cording after todays session - 11/21/20    Status Achieved      PT LONG TERM GOAL #2   Title Pt will be ind with HEP for continued UE strength and mobility throughout radiation and after    Baseline Pt is independent with HEP for flexibility but did not wish to cont PT so did not get to progress to strengthening - 11/21/20    Status Partially Met                 Plan - 11/28/20 1004    Clinical Impression  Statement Pt returns for one final visit before being placed on hold until after radiaiton unless her cording worsens and becomes limiting again and she knows she can call us if that happens. Continued with manual therapy focusing on decreasing palpable cording that is improved since last session. Also progressed HEP to include supine scapular series which pt did well with today.    Personal Factors and Comorbidities Fitness    Examination-Activity Limitations Reach Overhead    Examination-Participation Restrictions Meal Prep;Yard Work    Stability/Clinical Decision Making Stable/Uncomplicated    Rehab Potential Excellent    PT Frequency 1x / week    PT Duration 6 weeks    PT Treatment/Interventions Therapeutic exercise;Patient/family education;Manual lymph drainage;ADLs/Self Care Home Management;Manual techniques;Passive range of motion    PT Next Visit Plan Cont every 3 month L-Dex screen for up to 2 years after her SLNB which will be around 10/16/21. Pt placed on hold in case she needs to resume physical therapy after radiation.    PT Home Exercise Plan post op; supine scapular series    Consulted and Agree with Plan of Care Patient           Patient will benefit from skilled therapeutic intervention in order to improve the following deficits and impairments:  Postural dysfunction,Decreased safety awareness,Decreased range of motion,Impaired flexibility  Visit Diagnosis: Aftercare following surgery for neoplasm  Stiffness of right shoulder, not elsewhere classified  At risk for lymphedema  Abnormal posture     Problem List Patient Active Problem List   Diagnosis Date Noted  . Port-A-Cath in place 11/20/2020  . Malignant neoplasm of upper-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, estrogen receptor positive ([place]) 09/20/2020  . Precordial pain 04/14/2014  . Essential hypertension, benign 08/03/2013  . Family history of ischemic heart disease 08/03/2013    [name], PTA 11/28/2020, 10:06 AM  [place], [place], [address] Phone: [phone]   Fax:  [phone]  Name: Grace Pennington MRN: [id number] Date of Birth: [birth year]

## 2020-12-03 NOTE — Progress Notes (Signed)
Patient Care Team: Seward Carol, MD as PCP - General (Internal Medicine)  DIAGNOSIS:    ICD-10-CM   1. Malignant neoplasm of upper-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, estrogen receptor positive ([place])  C50.211    Z17.0     SUMMARY OF ONCOLOGIC HISTORY: Oncology History  Malignant neoplasm of upper-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, estrogen receptor positive ([place])  09/08/2020 Cancer Staging   Staging form: Breast, AJCC 8th Edition - Clinical stage from 09/08/2020: Stage IA (cT1b, cN0, cM0, G2, ER+, PR+, HER2+) - Signed by Gardenia Phlegm, NP on 09/20/2020 Stage prefix: Initial diagnosis   09/08/2020 Initial Diagnosis   Mammogram detected 0.7 cm mass in the right breast 1 o'clock position, right breast biopsy 1: Grade 2 IDC ER 95% PR 95%, Ki-67 30%, HER-2 positive ratio 2.83   10/16/2020 Surgery   Right lumpectomy [name]): IDC, 1.0cm, grade 3, with high grade DCIS, clear margins, 4 right axillary lymph nodes negative for carcinoma.   10/16/2020 Cancer Staging   Staging form: Breast, AJCC 8th Edition - Pathologic stage from 10/16/2020: Stage IA (pT1b, pN0, cM0, G3, ER+, PR+, HER2+) - Signed by Gardenia Phlegm, NP on 11/01/2020 Histologic grading system: 3 grade system   11/13/2020 -  Chemotherapy    Patient is on Treatment Plan: BREAST PACLITAXEL + TRASTUZUMAB Q7D / TRASTUZUMAB Q21D        CHIEF COMPLIANT: Cycle 4 Taxol Herceptin  INTERVAL HISTORY: Grace Pennington is a 50 y.o. with above-mentioned history of right breast cancer who underwent a right lumpectomy and is currently on adjuvant chemotherapy with Taxol Herceptin. She presents to the clinic today for treatment.   ALLERGIES:  has No Known Allergies.  MEDICATIONS:  Current Outpatient Medications  Medication Sig Dispense Refill  . ALPRAZolam (XANAX) 0.25 MG tablet TAKE 1 TABLET BY MOUTH ONCE A DAY AS NEEDED 30 tablet 3  . citalopram (CELEXA) 10 MG tablet TAKE 1 TABLET BY MOUTH ONCE DAILY 90  tablet 3  . fluticasone (FLONASE) 50 MCG/ACT nasal spray Place 2 sprays into both nostrils daily as needed for allergies.    . hydrochlorothiazide (HYDRODIURIL) 25 MG tablet TAKE 1 TABLET BY MOUTH ONCE DAILY 90 tablet 3  . hydroxypropyl methylcellulose / hypromellose (ISOPTO TEARS / GONIOVISC) 2.5 % ophthalmic solution Place 1 drop into both eyes 3 (three) times daily as needed (dry/irritated eyes).    Marland Kitchen lidocaine-prilocaine (EMLA) cream APPLY TO AFFECTED AREA ONCE 30 g 3  . loratadine (CLARITIN) 10 MG tablet Take 10 mg by mouth daily as needed for allergies.    . Multiple Vitamins-Minerals (MULTIVITAMIN WITH MINERALS) tablet Take 1 tablet by mouth daily.    . ondansetron (ZOFRAN) 8 MG tablet TAKE 1 TABLET BY MOUTH 2 TIMES DAILY AS NEEDED FOR NAUSEA OR VOMITING 30 tablet 1  . pantoprazole (PROTONIX) 40 MG tablet TAKE 1 TABLET BY MOUTH DAILY 90 tablet 3  . prochlorperazine (COMPAZINE) 10 MG tablet TAKE 1 TABLET BY MOUTH EVERY 6 HOURS AS NEEDED FOR NAUSEA OR VOMITING 30 tablet 1  . rosuvastatin (CRESTOR) 10 MG tablet TAKE 1 TABLET BY MOUTH ONCE A DAY 90 tablet 3  . spironolactone (ALDACTONE) 50 MG tablet TAKE 1 TABLET BY MOUTH 2 TIMES DAILY 60 tablet 12   No current facility-administered medications for this visit.    PHYSICAL EXAMINATION: ECOG PERFORMANCE STATUS: 1 - Symptomatic but completely ambulatory  Vitals:   12/04/20 1008  BP: 115/74  Pulse: 69  Resp: 16  Temp: (!) 97.2  F (36.2 C)  SpO2: 99%   Filed Weights   12/04/20 1008  Weight: 258 lb 14.4 oz (117.4 kg)     LABORATORY DATA:  I have reviewed the data as listed CMP Latest Ref Rng & Units 11/27/2020 11/20/2020 11/13/2020  Glucose 70 - 99 mg/dL 116(H) 124(H) 130(H)  BUN 6 - 20 mg/dL 15 20 14   Creatinine 0.44 - 1.00 mg/dL 1.00 1.04(H) 1.01(H)  Sodium 135 - 145 mmol/L 139 138 139  Potassium 3.5 - 5.1 mmol/L 4.0 3.9 3.7  Chloride 98 - 111 mmol/L 102 102 103  CO2 22 - 32 mmol/L 29 27 26   Calcium 8.9 - 10.3 mg/dL 9.4 9.4 9.6   Total Protein 6.5 - 8.1 g/dL 7.6 7.5 7.7  Total Bilirubin 0.3 - 1.2 mg/dL 0.3 0.5 0.5  Alkaline Phos 38 - 126 U/L 73 68 75  AST 15 - 41 U/L 35 25 27  ALT 0 - 44 U/L 58(H) 41 36    Lab Results  Component Value Date   WBC 4.6 12/04/2020   HGB 13.9 12/04/2020   HCT 41.6 12/04/2020   MCV 88.7 12/04/2020   PLT 320 12/04/2020   NEUTROABS 2.0 12/04/2020    ASSESSMENT & PLAN:  Malignant neoplasm of upper-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, estrogen receptor positive ([facility]) 09/08/2020:Mammogram detected 0.7 cm mass in the right breast 1 o'clock position, right breast biopsy 1: Grade 2 IDC ER 95% PR 95%, Ki-67 30%, HER-2 positive ratio 2.83 T1b N0 stage Ia  10/16/20: Grade 3 IDC with DCIS 0/4 LNER 95% PR 95%, Ki-67 30%, HER-2 positive ratio 2.83  Treatment Plan: 1.adjuvant Taxol Herceptin followed by Herceptin maintenance for 1 yearplan to start in 3 weeks 2.Adjuvant radiation 3.Followed by adjuvant antiestrogen therapy -------------------------------------------------------------------------------------------------------------------------- Current Treatment: Cycle 4 Taxol Herceptin Chemo Toxicities:  Denies any adverse effects of chemotherapy.  Denies any nausea or vomiting. Alopecia Slight leukopenia: ANC today is 2. okay to proceed with treatment.  RTC weekly for chemo and q 2 weeks for follow ups  No orders of the defined types were placed in this encounter.  The patient has a good understanding of the overall plan. she agrees with it. she will call with any problems that may develop before the next visit here.  Total time spent: 30 mins including face to face time and time spent for planning, charting and coordination of care  Rulon Eisenmenger, MD, MPH 12/04/2020  I, Cloyde Reams Dorshimer, am acting as scribe for Dr. Nicholas Lose.  I have reviewed the above documentation for accuracy and completeness, and I agree with the above.

## 2020-12-04 ENCOUNTER — Inpatient Hospital Stay (HOSPITAL_BASED_OUTPATIENT_CLINIC_OR_DEPARTMENT_OTHER): Payer: No Typology Code available for payment source | Admitting: Hematology and Oncology

## 2020-12-04 ENCOUNTER — Inpatient Hospital Stay: Payer: No Typology Code available for payment source

## 2020-12-04 ENCOUNTER — Encounter: Payer: Self-pay | Admitting: *Deleted

## 2020-12-04 ENCOUNTER — Other Ambulatory Visit: Payer: Self-pay

## 2020-12-04 ENCOUNTER — Other Ambulatory Visit: Payer: No Typology Code available for payment source

## 2020-12-04 DIAGNOSIS — Z95828 Presence of other vascular implants and grafts: Secondary | ICD-10-CM

## 2020-12-04 DIAGNOSIS — Z5112 Encounter for antineoplastic immunotherapy: Secondary | ICD-10-CM | POA: Diagnosis not present

## 2020-12-04 DIAGNOSIS — Z170 Estrogen receptor positive status [ER+]: Secondary | ICD-10-CM

## 2020-12-04 DIAGNOSIS — C50211 Malignant neoplasm of upper-inner quadrant of right female breast: Secondary | ICD-10-CM | POA: Diagnosis not present

## 2020-12-04 LAB — CBC WITH DIFFERENTIAL (CANCER CENTER ONLY)
Abs Immature Granulocytes: 0.03 10*3/uL (ref 0.00–0.07)
Basophils Absolute: 0 10*3/uL (ref 0.0–0.1)
Basophils Relative: 1 %
Eosinophils Absolute: 0.1 10*3/uL (ref 0.0–0.5)
Eosinophils Relative: 2 %
HCT: 41.6 % (ref 36.0–46.0)
Hemoglobin: 13.9 g/dL (ref 12.0–15.0)
Immature Granulocytes: 1 %
Lymphocytes Relative: 46 %
Lymphs Abs: 2.2 10*3/uL (ref 0.7–4.0)
MCH: 29.6 pg (ref 26.0–34.0)
MCHC: 33.4 g/dL (ref 30.0–36.0)
MCV: 88.7 fL (ref 80.0–100.0)
Monocytes Absolute: 0.3 10*3/uL (ref 0.1–1.0)
Monocytes Relative: 7 %
Neutro Abs: 2 10*3/uL (ref 1.7–7.7)
Neutrophils Relative %: 43 %
Platelet Count: 320 10*3/uL (ref 150–400)
RBC: 4.69 MIL/uL (ref 3.87–5.11)
RDW: 14.8 % (ref 11.5–15.5)
WBC Count: 4.6 10*3/uL (ref 4.0–10.5)
nRBC: 0 % (ref 0.0–0.2)

## 2020-12-04 LAB — CMP (CANCER CENTER ONLY)
ALT: 34 U/L (ref 0–44)
AST: 19 U/L (ref 15–41)
Albumin: 3.9 g/dL (ref 3.5–5.0)
Alkaline Phosphatase: 71 U/L (ref 38–126)
Anion gap: 11 (ref 5–15)
BUN: 13 mg/dL (ref 6–20)
CO2: 26 mmol/L (ref 22–32)
Calcium: 9.6 mg/dL (ref 8.9–10.3)
Chloride: 103 mmol/L (ref 98–111)
Creatinine: 1.08 mg/dL — ABNORMAL HIGH (ref 0.44–1.00)
GFR, Estimated: >60 mL/min (ref >60)
Glucose, Bld: 130 mg/dL — ABNORMAL HIGH (ref 70–99)
Potassium: 3.9 mmol/L (ref 3.5–5.1)
Sodium: 140 mmol/L (ref 135–145)
Total Bilirubin: 0.4 mg/dL (ref 0.3–1.2)
Total Protein: 7.6 g/dL (ref 6.5–8.1)

## 2020-12-04 MED ORDER — DIPHENHYDRAMINE HCL 50 MG/ML IJ SOLN
INTRAMUSCULAR | Status: AC
  Filled 2020-12-04: qty 1

## 2020-12-04 MED ORDER — SODIUM CHLORIDE 0.9% FLUSH
10.0000 mL | INTRAVENOUS | Status: DC | PRN
  Administered 2020-12-04: 10 mL
  Filled 2020-12-04: qty 10

## 2020-12-04 MED ORDER — ACETAMINOPHEN 325 MG PO TABS
ORAL_TABLET | ORAL | Status: AC
  Filled 2020-12-04: qty 2

## 2020-12-04 MED ORDER — SODIUM CHLORIDE 0.9 % IV SOLN
65.0000 mg/m2 | Freq: Once | INTRAVENOUS | Status: AC
  Administered 2020-12-04: 150 mg via INTRAVENOUS
  Filled 2020-12-04: qty 25

## 2020-12-04 MED ORDER — SODIUM CHLORIDE 0.9% FLUSH
10.0000 mL | Freq: Once | INTRAVENOUS | Status: AC
Start: 2020-12-04 — End: 2020-12-04
  Administered 2020-12-04: 10 mL
  Filled 2020-12-04: qty 10

## 2020-12-04 MED ORDER — FAMOTIDINE 20 MG IN NS 100 ML IVPB
20.0000 mg | Freq: Once | INTRAVENOUS | Status: AC
  Administered 2020-12-04: 20 mg via INTRAVENOUS

## 2020-12-04 MED ORDER — HEPARIN SOD (PORK) LOCK FLUSH 100 UNIT/ML IV SOLN
500.0000 [IU] | Freq: Once | INTRAVENOUS | Status: AC | PRN
  Administered 2020-12-04: 500 [IU]
  Filled 2020-12-04: qty 5

## 2020-12-04 MED ORDER — SODIUM CHLORIDE 0.9 % IV SOLN
Freq: Once | INTRAVENOUS | Status: AC
  Filled 2020-12-04: qty 250

## 2020-12-04 MED ORDER — DIPHENHYDRAMINE HCL 50 MG/ML IJ SOLN
25.0000 mg | Freq: Once | INTRAMUSCULAR | Status: AC
  Administered 2020-12-04: 25 mg via INTRAVENOUS

## 2020-12-04 MED ORDER — TRASTUZUMAB-DKST CHEMO 150 MG IV SOLR
2.0000 mg/kg | Freq: Once | INTRAVENOUS | Status: AC
  Administered 2020-12-04: 231 mg via INTRAVENOUS
  Filled 2020-12-04: qty 11

## 2020-12-04 MED ORDER — FAMOTIDINE 20 MG IN NS 100 ML IVPB
INTRAVENOUS | Status: AC
  Filled 2020-12-04: qty 100

## 2020-12-04 MED ORDER — SODIUM CHLORIDE 0.9 % IV SOLN
10.0000 mg | Freq: Once | INTRAVENOUS | Status: AC
  Administered 2020-12-04: 10 mg via INTRAVENOUS
  Filled 2020-12-04: qty 10

## 2020-12-04 MED ORDER — ACETAMINOPHEN 325 MG PO TABS
650.0000 mg | ORAL_TABLET | Freq: Once | ORAL | Status: AC
  Administered 2020-12-04: 650 mg via ORAL

## 2020-12-04 NOTE — Patient Instructions (Signed)
[facility]COL[facility]   Discharge Instructions: Thank you for choosing [facility] to provide your oncology and hematology care.   If you have a lab appointment with the Cancer Center, please go directly to the Cancer Center and check in at the registration area.   Wear comfortable clothing and clothing appropriate for easy access to any Portacath or PICC line.   We strive to give you quality time with your provider. You may need to reschedule your appointment if you arrive late (15 or more minutes).  Arriving late affects you and other patients whose appointments are after yours.  Also, if you miss three or more appointments without notifying the office, you may be dismissed from the clinic at the provider's discretion.      For prescription refill requests, have your pharmacy contact our office and allow 72 hours for refills to be completed.    Today you received the following chemotherapy and/or immunotherapy agents: trastuzumab and paclitaxel.      To help prevent nausea and vomiting after your treatment, we encourage you to take your nausea medication as directed.  BELOW ARE SYMPTOMS THAT SHOULD BE REPORTED IMMEDIATELY: *FEVER GREATER THAN 100.4 F (38 C) OR HIGHER *CHILLS OR SWEATING *NAUSEA AND VOMITING THAT IS NOT CONTROLLED WITH YOUR NAUSEA MEDICATION *UNUSUAL SHORTNESS OF BREATH *UNUSUAL BRUISING OR BLEEDING *URINARY PROBLEMS (pain or burning when urinating, or frequent urination) *BOWEL PROBLEMS (unusual diarrhea, constipation, pain near the anus) TENDERNESS IN MOUTH AND THROAT WITH OR WITHOUT PRESENCE OF ULCERS (sore throat, sores in mouth, or a toothache) UNUSUAL RASH, SWELLING OR PAIN  UNUSUAL VAGINAL DISCHARGE OR ITCHING   Items with * indicate a potential emergency and should be followed up as soon as possible or go to the Emergency Department if any problems should occur.  Please show the CHEMOTHERAPY ALERT CARD or IMMUNOTHERAPY ALERT  CARD at check-in to the Emergency Department and triage nurse.  Should you have questions after your visit or need to cancel or reschedule your appointment, please contact [facility]  Dept: [phone]  and follow the prompts.  Office hours are 8:00 a.m. to 4:30 p.m. Monday - Friday. Please note that voicemails left after 4:00 p.m. may not be returned until the following business day.  We are closed weekends and major holidays. You have access to a nurse at all times for urgent questions. Please call the main number to the clinic Dept: [phone] and follow the prompts.   For any non-urgent questions, you may also contact your provider using MyChart. We now offer e-Visits for anyone 18 and older to request care online for non-urgent symptoms. For details visit [email or web address].   Also download the MyChart app! Go to the app store, search "MyChart", open the app, select [facility], and log in with your MyChart username and password.  Due to Covid, a mask is required upon entering the hospital/clinic. If you do not have a mask, one will be given to you upon arrival. For doctor visits, patients may have 1 support person aged 18 or older with them. For treatment visits, patients cannot have anyone with them due to current Covid guidelines and our immunocompromised population.   

## 2020-12-04 NOTE — Assessment & Plan Note (Signed)
09/08/2020:Mammogram detected 0.7 cm mass in the right breast 1 o'clock position, right breast biopsy 1: Grade 2 IDC ER 95% PR 95%, Ki-67 30%, HER-2 positive ratio 2.83 T1b N0 stage Ia  10/16/20: Grade 3 IDC with DCIS 0/4 LNER 95% PR 95%, Ki-67 30%, HER-2 positive ratio 2.83  Treatment Plan: 1.adjuvant Taxol Herceptin followed by Herceptin maintenance for 1 yearplan to start in 3 weeks 2.Adjuvant radiation 3.Followed by adjuvant antiestrogen therapy -------------------------------------------------------------------------------------------------------------------------- Current Treatment: Cycle 4 Taxol Herceptin Chemo Toxicities: Slight leukopenia: ANC today is 1.4, okay to proceed with treatment. I discussed with her that there is a possibility that we may lower the dosage of future chemotherapies.  RTC weekly for chemo and q 2 weeks for follow ups

## 2020-12-07 ENCOUNTER — Telehealth: Payer: Self-pay | Admitting: *Deleted

## 2020-12-07 NOTE — Telephone Encounter (Signed)
Received call from pt with complaint of cough and nasal congestion x1 day unrelieved with Claritin.  Per MD pt needing to be Covid tested.  Pt educated to go to local Covid pharmacy to be tested or to preform a home test if available.  RN also educated pt on taking OTC Mucinex DM or Sudafed to help alleviate sinus symptoms.  Pt educated to call the office if symptoms do not improve with OTC medication and when Covid test is resulted.  Pt verbalized understanding and appreciative of advice.

## 2020-12-08 ENCOUNTER — Encounter: Payer: Self-pay | Admitting: *Deleted

## 2020-12-08 ENCOUNTER — Telehealth: Payer: Self-pay | Admitting: Hematology and Oncology

## 2020-12-08 NOTE — Telephone Encounter (Signed)
Scheduled appts per 5/13 sch msg. Updated calendar will be printed for pt at next visit per appt notes.

## 2020-12-11 ENCOUNTER — Other Ambulatory Visit (HOSPITAL_COMMUNITY): Payer: Self-pay | Admitting: Gastroenterology

## 2020-12-11 ENCOUNTER — Inpatient Hospital Stay: Payer: No Typology Code available for payment source

## 2020-12-11 ENCOUNTER — Other Ambulatory Visit (HOSPITAL_COMMUNITY): Payer: Self-pay

## 2020-12-11 ENCOUNTER — Other Ambulatory Visit: Payer: No Typology Code available for payment source

## 2020-12-11 ENCOUNTER — Ambulatory Visit: Payer: No Typology Code available for payment source | Admitting: Hematology and Oncology

## 2020-12-11 ENCOUNTER — Other Ambulatory Visit: Payer: Self-pay

## 2020-12-11 VITALS — BP 106/80 | HR 68 | Temp 98.0°F | Resp 18

## 2020-12-11 DIAGNOSIS — C50211 Malignant neoplasm of upper-inner quadrant of right female breast: Secondary | ICD-10-CM

## 2020-12-11 DIAGNOSIS — Z95828 Presence of other vascular implants and grafts: Secondary | ICD-10-CM

## 2020-12-11 DIAGNOSIS — Z170 Estrogen receptor positive status [ER+]: Secondary | ICD-10-CM

## 2020-12-11 DIAGNOSIS — Z5112 Encounter for antineoplastic immunotherapy: Secondary | ICD-10-CM | POA: Diagnosis not present

## 2020-12-11 LAB — CBC WITH DIFFERENTIAL (CANCER CENTER ONLY)
Abs Immature Granulocytes: 0.03 10*3/uL (ref 0.00–0.07)
Basophils Absolute: 0 10*3/uL (ref 0.0–0.1)
Basophils Relative: 1 %
Eosinophils Absolute: 0.1 10*3/uL (ref 0.0–0.5)
Eosinophils Relative: 1 %
HCT: 39.8 % (ref 36.0–46.0)
Hemoglobin: 13 g/dL (ref 12.0–15.0)
Immature Granulocytes: 1 %
Lymphocytes Relative: 46 %
Lymphs Abs: 2.5 10*3/uL (ref 0.7–4.0)
MCH: 29.3 pg (ref 26.0–34.0)
MCHC: 32.7 g/dL (ref 30.0–36.0)
MCV: 89.6 fL (ref 80.0–100.0)
Monocytes Absolute: 0.3 10*3/uL (ref 0.1–1.0)
Monocytes Relative: 6 %
Neutro Abs: 2.4 10*3/uL (ref 1.7–7.7)
Neutrophils Relative %: 45 %
Platelet Count: 296 10*3/uL (ref 150–400)
RBC: 4.44 MIL/uL (ref 3.87–5.11)
RDW: 15.1 % (ref 11.5–15.5)
WBC Count: 5.3 10*3/uL (ref 4.0–10.5)
nRBC: 0 % (ref 0.0–0.2)

## 2020-12-11 LAB — CMP (CANCER CENTER ONLY)
ALT: 35 U/L (ref 0–44)
AST: 22 U/L (ref 15–41)
Albumin: 3.7 g/dL (ref 3.5–5.0)
Alkaline Phosphatase: 64 U/L (ref 38–126)
Anion gap: 6 (ref 5–15)
BUN: 13 mg/dL (ref 6–20)
CO2: 29 mmol/L (ref 22–32)
Calcium: 9.7 mg/dL (ref 8.9–10.3)
Chloride: 103 mmol/L (ref 98–111)
Creatinine: 1 mg/dL (ref 0.44–1.00)
GFR, Estimated: >60 mL/min (ref >60)
Glucose, Bld: 135 mg/dL — ABNORMAL HIGH (ref 70–99)
Potassium: 4.3 mmol/L (ref 3.5–5.1)
Sodium: 138 mmol/L (ref 135–145)
Total Bilirubin: 0.4 mg/dL (ref 0.3–1.2)
Total Protein: 7.3 g/dL (ref 6.5–8.1)

## 2020-12-11 MED ORDER — TRASTUZUMAB-DKST CHEMO 150 MG IV SOLR
2.0000 mg/kg | Freq: Once | INTRAVENOUS | Status: AC
  Administered 2020-12-11: 231 mg via INTRAVENOUS
  Filled 2020-12-11: qty 11

## 2020-12-11 MED ORDER — SODIUM CHLORIDE 0.9% FLUSH
10.0000 mL | INTRAVENOUS | Status: DC | PRN
  Administered 2020-12-11: 10 mL
  Filled 2020-12-11: qty 10

## 2020-12-11 MED ORDER — DIPHENHYDRAMINE HCL 50 MG/ML IJ SOLN
25.0000 mg | Freq: Once | INTRAMUSCULAR | Status: AC
Start: 2020-12-11 — End: 2020-12-11
  Administered 2020-12-11: 25 mg via INTRAVENOUS

## 2020-12-11 MED ORDER — ACETAMINOPHEN 325 MG PO TABS
ORAL_TABLET | ORAL | Status: AC
  Filled 2020-12-11: qty 1

## 2020-12-11 MED ORDER — SODIUM CHLORIDE 0.9 % IV SOLN
Freq: Once | INTRAVENOUS | Status: AC
  Filled 2020-12-11: qty 250

## 2020-12-11 MED ORDER — ACETAMINOPHEN 325 MG PO TABS
650.0000 mg | ORAL_TABLET | Freq: Once | ORAL | Status: AC
  Administered 2020-12-11: 650 mg via ORAL

## 2020-12-11 MED ORDER — SODIUM CHLORIDE 0.9 % IV SOLN
10.0000 mg | Freq: Once | INTRAVENOUS | Status: AC
  Administered 2020-12-11: 10 mg via INTRAVENOUS
  Filled 2020-12-11: qty 10

## 2020-12-11 MED ORDER — HEPARIN SOD (PORK) LOCK FLUSH 100 UNIT/ML IV SOLN
500.0000 [IU] | Freq: Once | INTRAVENOUS | Status: AC | PRN
  Administered 2020-12-11: 500 [IU]
  Filled 2020-12-11: qty 5

## 2020-12-11 MED ORDER — SODIUM CHLORIDE 0.9 % IV SOLN
65.0000 mg/m2 | Freq: Once | INTRAVENOUS | Status: AC
  Administered 2020-12-11: 150 mg via INTRAVENOUS
  Filled 2020-12-11: qty 25

## 2020-12-11 MED ORDER — FAMOTIDINE 20 MG IN NS 100 ML IVPB
INTRAVENOUS | Status: AC
  Filled 2020-12-11: qty 100

## 2020-12-11 MED ORDER — SODIUM CHLORIDE 0.9% FLUSH
10.0000 mL | Freq: Once | INTRAVENOUS | Status: AC
  Administered 2020-12-11: 10 mL
  Filled 2020-12-11: qty 10

## 2020-12-11 MED ORDER — FAMOTIDINE 20 MG IN NS 100 ML IVPB
20.0000 mg | Freq: Once | INTRAVENOUS | Status: AC
  Administered 2020-12-11: 20 mg via INTRAVENOUS

## 2020-12-11 MED ORDER — ACETAMINOPHEN 325 MG PO TABS
ORAL_TABLET | ORAL | Status: AC
  Filled 2020-12-11: qty 2

## 2020-12-11 MED ORDER — DIPHENHYDRAMINE HCL 50 MG/ML IJ SOLN
INTRAMUSCULAR | Status: AC
  Filled 2020-12-11: qty 1

## 2020-12-11 MED FILL — Hydrochlorothiazide Tab 25 MG: ORAL | 90 days supply | Qty: 90 | Fill #0 | Status: AC

## 2020-12-11 NOTE — Patient Instructions (Signed)
[facility]COL[facility]  Discharge Instructions: Thank you for choosing [facility] to provide your oncology and hematology care.   If you have a lab appointment with the [facility], please go directly to the [facility] and check in at the registration area.   Wear comfortable clothing and clothing appropriate for easy access to any Portacath or PICC line.   We strive to give you quality time with your provider. You may need to reschedule your appointment if you arrive late (15 or more minutes).  Arriving late affects you and other patients whose appointments are after yours.  Also, if you miss three or more appointments without notifying the office, you may be dismissed from the clinic at the provider's discretion.      For prescription refill requests, have your pharmacy contact our office and allow 72 hours for refills to be completed.    Today you received the following chemotherapy and/or immunotherapy agents trastuzumab, paclitaxel      To help prevent nausea and vomiting after your treatment, we encourage you to take your nausea medication as directed.  BELOW ARE SYMPTOMS THAT SHOULD BE REPORTED IMMEDIATELY: . *FEVER GREATER THAN 100.4 F (38 C) OR HIGHER . *CHILLS OR SWEATING . *NAUSEA AND VOMITING THAT IS NOT CONTROLLED WITH YOUR NAUSEA MEDICATION . *UNUSUAL SHORTNESS OF BREATH . *UNUSUAL BRUISING OR BLEEDING . *URINARY PROBLEMS (pain or burning when urinating, or frequent urination) . *BOWEL PROBLEMS (unusual diarrhea, constipation, pain near the anus) . TENDERNESS IN MOUTH AND THROAT WITH OR WITHOUT PRESENCE OF ULCERS (sore throat, sores in mouth, or a toothache) . UNUSUAL RASH, SWELLING OR PAIN  . UNUSUAL VAGINAL DISCHARGE OR ITCHING   Items with * indicate a potential emergency and should be followed up as soon as possible or go to the Emergency Department if any problems should occur.  Please show the CHEMOTHERAPY ALERT CARD or  IMMUNOTHERAPY ALERT CARD at check-in to the Emergency Department and triage nurse.  Should you have questions after your visit or need to cancel or reschedule your appointment, please contact [facility]  Dept: [phone]  and follow the prompts.  Office hours are 8:00 a.m. to 4:30 p.m. Monday - Friday. Please note that voicemails left after 4:00 p.m. may not be returned until the following business day.  We are closed weekends and major holidays. You have access to a nurse at all times for urgent questions. Please call the main number to the clinic Dept: [phone] and follow the prompts.   For any non-urgent questions, you may also contact your provider using MyChart. We now offer e-Visits for anyone 78 and older to request care online for non-urgent symptoms. For details visit [email or web address].   Also download the MyChart app! Go to the app store, search "MyChart", open the app, select [facility], and log in with your MyChart username and password.  Due to Covid, a mask is required upon entering the hospital/clinic. If you do not have a mask, one will be given to you upon arrival. For doctor visits, patients may have 1 support person aged 73 or older with them. For treatment visits, patients cannot have anyone with them due to current Covid guidelines and our immunocompromised population.

## 2020-12-12 ENCOUNTER — Other Ambulatory Visit (HOSPITAL_COMMUNITY): Payer: Self-pay

## 2020-12-13 ENCOUNTER — Encounter: Payer: Self-pay | Admitting: *Deleted

## 2020-12-13 ENCOUNTER — Other Ambulatory Visit (HOSPITAL_COMMUNITY): Payer: Self-pay

## 2020-12-13 ENCOUNTER — Other Ambulatory Visit (HOSPITAL_COMMUNITY): Payer: Self-pay | Admitting: Gastroenterology

## 2020-12-13 NOTE — Progress Notes (Signed)
RN successfully faxed [employer] completed disability paperwork to [phone].

## 2020-12-14 ENCOUNTER — Other Ambulatory Visit (HOSPITAL_COMMUNITY): Payer: Self-pay

## 2020-12-14 MED ORDER — PANTOPRAZOLE SODIUM 40 MG PO TBEC
1.0000 | DELAYED_RELEASE_TABLET | Freq: Every day | ORAL | 1 refills | Status: DC
  Filled 2020-12-14: qty 90, 90d supply, fill #0
  Filled 2021-03-21: qty 90, 90d supply, fill #1

## 2020-12-17 MED FILL — Citalopram Hydrobromide Tab 10 MG (Base Equiv): ORAL | 90 days supply | Qty: 90 | Fill #0 | Status: AC

## 2020-12-17 MED FILL — Rosuvastatin Calcium Tab 10 MG: ORAL | 90 days supply | Qty: 90 | Fill #0 | Status: AC

## 2020-12-17 MED FILL — Spironolactone Tab 50 MG: ORAL | 30 days supply | Qty: 60 | Fill #0 | Status: AC

## 2020-12-18 ENCOUNTER — Other Ambulatory Visit (HOSPITAL_COMMUNITY): Payer: Self-pay

## 2020-12-18 ENCOUNTER — Telehealth: Payer: Self-pay | Admitting: *Deleted

## 2020-12-18 NOTE — Telephone Encounter (Signed)
Connected with MICHIE MOLNAR [phone]) regarding [employer] FMLA claim [id number] to determine type of leave.  "Don't worry about this form.  Currently on continuous leave through June 6th but will still receive chemotherapy.  I think I'll need a day to receive treatment, a day to recuperate or recover for an intermittent leave working three days per week.  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  Hands cramp up a lot; get up ay night to walk because my feet wake me from sleep.  No trouble walking."  Requested clarification of above.  There is a difference of "reduced work schedule" and "intermittent leave".  [employer] and leadership must be called every day needed for appointments per [employer] protocol.  Benefits differ for 24 work hours compared to 40-hours.  Radiation and Medical Oncology may be able to make scheduling accommodations as requested for a specific appointment time to coordinate Herceptin targeted therapy and radiotherapy treatments with work schedule and drive time from [place].  Expect tiredness, flu-like symptoms, skin changes with these therapies     *Holding form (Tracking entry number 3125) for decision of type of leave.

## 2020-12-19 NOTE — Progress Notes (Signed)
Patient Care Team: Seward Carol, MD as PCP - General (Internal Medicine)  DIAGNOSIS:    ICD-10-CM   1. Malignant neoplasm of upper-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, estrogen receptor positive ([place])  C50.211    Z17.0     SUMMARY OF ONCOLOGIC HISTORY: Oncology History  Malignant neoplasm of upper-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, estrogen receptor positive ([place])  09/08/2020 Cancer Staging   Staging form: Breast, AJCC 8th Edition - Clinical stage from 09/08/2020: Stage IA (cT1b, cN0, cM0, G2, ER+, PR+, HER2+) - Signed by Gardenia Phlegm, NP on 09/20/2020 Stage prefix: Initial diagnosis   09/08/2020 Initial Diagnosis   Mammogram detected 0.7 cm mass in the right breast 1 o'clock position, right breast biopsy 1: Grade 2 IDC ER 95% PR 95%, Ki-67 30%, HER-2 positive ratio 2.83   10/16/2020 Surgery   Right lumpectomy [name]): IDC, 1.0cm, grade 3, with high grade DCIS, clear margins, 4 right axillary lymph nodes negative for carcinoma.   10/16/2020 Cancer Staging   Staging form: Breast, AJCC 8th Edition - Pathologic stage from 10/16/2020: Stage IA (pT1b, pN0, cM0, G3, ER+, PR+, HER2+) - Signed by Gardenia Phlegm, NP on 11/01/2020 Histologic grading system: 3 grade system   11/13/2020 -  Chemotherapy    Patient is on Treatment Plan: BREAST PACLITAXEL + TRASTUZUMAB Q7D / TRASTUZUMAB Q21D        CHIEF COMPLIANT: Cycle 6 Taxol Herceptin  INTERVAL HISTORY: Grace Pennington is a 50 y.o. with above-mentioned history of right breast cancerwhounderwent a right lumpectomyand is currently on adjuvant chemotherapy with Taxol Herceptin.She presents to the clinic todayfor treatment.  She had complete hair loss after the last cycle of chemo.  Did not have any fatigue or nausea or vomiting.  Bowels are moving well.  ALLERGIES:  has No Known Allergies.  MEDICATIONS:  Current Outpatient Medications  Medication Sig Dispense Refill  . ALPRAZolam (XANAX) 0.25 MG tablet  TAKE 1 TABLET BY MOUTH ONCE A DAY AS NEEDED 30 tablet 3  . citalopram (CELEXA) 10 MG tablet TAKE 1 TABLET BY MOUTH ONCE DAILY 90 tablet 3  . fluticasone (FLONASE) 50 MCG/ACT nasal spray Place 2 sprays into both nostrils daily as needed for allergies.    . hydrochlorothiazide (HYDRODIURIL) 25 MG tablet TAKE 1 TABLET BY MOUTH ONCE DAILY 90 tablet 3  . hydroxypropyl methylcellulose / hypromellose (ISOPTO TEARS / GONIOVISC) 2.5 % ophthalmic solution Place 1 drop into both eyes 3 (three) times daily as needed (dry/irritated eyes).    Marland Kitchen lidocaine-prilocaine (EMLA) cream APPLY TO AFFECTED AREA ONCE 30 g 3  . loratadine (CLARITIN) 10 MG tablet Take 10 mg by mouth daily as needed for allergies.    . Multiple Vitamins-Minerals (MULTIVITAMIN WITH MINERALS) tablet Take 1 tablet by mouth daily.    . ondansetron (ZOFRAN) 8 MG tablet TAKE 1 TABLET BY MOUTH 2 TIMES DAILY AS NEEDED FOR NAUSEA OR VOMITING 30 tablet 1  . pantoprazole (PROTONIX) 40 MG tablet TAKE 1 TABLET BY MOUTH DAILY 90 tablet 3  . pantoprazole (PROTONIX) 40 MG tablet TAKE 1 TABLET BY MOUTH DAILY 90 tablet 1  . prochlorperazine (COMPAZINE) 10 MG tablet TAKE 1 TABLET BY MOUTH EVERY 6 HOURS AS NEEDED FOR NAUSEA OR VOMITING 30 tablet 1  . rosuvastatin (CRESTOR) 10 MG tablet TAKE 1 TABLET BY MOUTH ONCE A DAY 90 tablet 3  . spironolactone (ALDACTONE) 50 MG tablet TAKE 1 TABLET BY MOUTH 2 TIMES DAILY 60 tablet 12   No current facility-administered  medications for this visit.   Facility-Administered Medications Ordered in Other Visits  Medication Dose Route Frequency Provider Last Rate Last Admin  . PACLitaxel (TAXOL) 150 mg in sodium chloride 0.9 % 250 mL chemo infusion (</= 63m/m2)  65 mg/m2 (Treatment Plan Recorded) Intravenous Once GNicholas Lose MD 275 mL/hr at 12/20/20 1401 150 mg at 12/20/20 1401    PHYSICAL EXAMINATION: ECOG PERFORMANCE STATUS: 1 - Symptomatic but completely ambulatory  Vitals:   12/20/20 1120  BP: 111/86  Pulse: 88   Resp: 16  Temp: (!) 97.3 F (36.3 C)  SpO2: 96%   Filed Weights   12/20/20 1120  Weight: 262 lb 12.8 oz (119.2 kg)    LABORATORY DATA:  I have reviewed the data as listed CMP Latest Ref Rng & Units 12/20/2020 12/11/2020 12/04/2020  Glucose 70 - 99 mg/dL 183(H) 135(H) 130(H)  BUN 6 - 20 mg/dL 14 13 13   Creatinine 0.44 - 1.00 mg/dL 0.96 1.00 1.08(H)  Sodium 135 - 145 mmol/L 139 138 140  Potassium 3.5 - 5.1 mmol/L 4.1 4.3 3.9  Chloride 98 - 111 mmol/L 103 103 103  CO2 22 - 32 mmol/L 27 29 26   Calcium 8.9 - 10.3 mg/dL 9.5 9.7 9.6  Total Protein 6.5 - 8.1 g/dL 7.3 7.3 7.6  Total Bilirubin 0.3 - 1.2 mg/dL 0.3 0.4 0.4  Alkaline Phos 38 - 126 U/L 78 64 71  AST 15 - 41 U/L 30 22 19   ALT 0 - 44 U/L 59(H) 35 34    Lab Results  Component Value Date   WBC 5.7 12/20/2020   HGB 13.2 12/20/2020   HCT 39.6 12/20/2020   MCV 88.2 12/20/2020   PLT 258 12/20/2020   NEUTROABS 2.8 12/20/2020    ASSESSMENT & PLAN:  Malignant neoplasm of upper-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, [place] positive [place] 09/08/[place]:Mammogram detected 0.7 cm mass in the right breast 1 o'clock position, right breast biopsy 1: Grade 2 IDC ER 95% PR 95%, Ki-67 30%, HER-2 positive ratio 2.83 T1b N0 stage Ia  10/16/20: Grade 3 IDC with DCIS 0/4 LNER 95% PR 95%, Ki-67 30%, HER-2 positive ratio 2.83  Treatment Plan: 1.adjuvant Taxol Herceptin followed by Herceptin maintenance for 1 yearplan to start in 3 weeks 2.Adjuvant radiation 3.Followed by adjuvant antiestrogen therapy -------------------------------------------------------------------------------------------------------------------------- Current Treatment: Cycle 6 Taxol Herceptin Chemo Toxicities:  Denies any adverse effects of chemotherapy.  Denies any nausea or vomiting. Alopecia Monitoring closely for peripheral neuropathy. RTC weekly for chemo and q 2 weeks for follow ups  No orders of the defined types were placed in this  encounter.  The patient has a good understanding of the overall plan. she agrees with it. she will call with any problems that may develop before the next visit here.  Total time spent: 30 mins including face to face time and time spent for planning, charting and coordination of care  VRulon Eisenmenger MD, MPH 12/20/2020  I, Molly Dorshimer, am acting as scribe for Dr. VNicholas Lose  I have reviewed the above documentation for accuracy and completeness, and I agree with the above.

## 2020-12-20 ENCOUNTER — Inpatient Hospital Stay: Payer: No Typology Code available for payment source

## 2020-12-20 ENCOUNTER — Other Ambulatory Visit: Payer: Self-pay

## 2020-12-20 ENCOUNTER — Inpatient Hospital Stay (HOSPITAL_BASED_OUTPATIENT_CLINIC_OR_DEPARTMENT_OTHER): Payer: No Typology Code available for payment source | Admitting: Hematology and Oncology

## 2020-12-20 DIAGNOSIS — C50211 Malignant neoplasm of upper-inner quadrant of right female breast: Secondary | ICD-10-CM

## 2020-12-20 DIAGNOSIS — Z170 Estrogen receptor positive status [ER+]: Secondary | ICD-10-CM | POA: Diagnosis not present

## 2020-12-20 DIAGNOSIS — Z95828 Presence of other vascular implants and grafts: Secondary | ICD-10-CM

## 2020-12-20 DIAGNOSIS — Z5112 Encounter for antineoplastic immunotherapy: Secondary | ICD-10-CM | POA: Diagnosis not present

## 2020-12-20 LAB — CMP (CANCER CENTER ONLY)
ALT: 59 U/L — ABNORMAL HIGH (ref 0–44)
AST: 30 U/L (ref 15–41)
Albumin: 3.7 g/dL (ref 3.5–5.0)
Alkaline Phosphatase: 78 U/L (ref 38–126)
Anion gap: 9 (ref 5–15)
BUN: 14 mg/dL (ref 6–20)
CO2: 27 mmol/L (ref 22–32)
Calcium: 9.5 mg/dL (ref 8.9–10.3)
Chloride: 103 mmol/L (ref 98–111)
Creatinine: 0.96 mg/dL (ref 0.44–1.00)
GFR, Estimated: >60 mL/min (ref >60)
Glucose, Bld: 183 mg/dL — ABNORMAL HIGH (ref 70–99)
Potassium: 4.1 mmol/L (ref 3.5–5.1)
Sodium: 139 mmol/L (ref 135–145)
Total Bilirubin: 0.3 mg/dL (ref 0.3–1.2)
Total Protein: 7.3 g/dL (ref 6.5–8.1)

## 2020-12-20 LAB — CBC WITH DIFFERENTIAL (CANCER CENTER ONLY)
Abs Immature Granulocytes: 0.04 10*3/uL (ref 0.00–0.07)
Basophils Absolute: 0 10*3/uL (ref 0.0–0.1)
Basophils Relative: 1 %
Eosinophils Absolute: 0.1 10*3/uL (ref 0.0–0.5)
Eosinophils Relative: 2 %
HCT: 39.6 % (ref 36.0–46.0)
Hemoglobin: 13.2 g/dL (ref 12.0–15.0)
Immature Granulocytes: 1 %
Lymphocytes Relative: 36 %
Lymphs Abs: 2.1 10*3/uL (ref 0.7–4.0)
MCH: 29.4 pg (ref 26.0–34.0)
MCHC: 33.3 g/dL (ref 30.0–36.0)
MCV: 88.2 fL (ref 80.0–100.0)
Monocytes Absolute: 0.6 10*3/uL (ref 0.1–1.0)
Monocytes Relative: 11 %
Neutro Abs: 2.8 10*3/uL (ref 1.7–7.7)
Neutrophils Relative %: 49 %
Platelet Count: 258 10*3/uL (ref 150–400)
RBC: 4.49 MIL/uL (ref 3.87–5.11)
RDW: 15.2 % (ref 11.5–15.5)
WBC Count: 5.7 10*3/uL (ref 4.0–10.5)
nRBC: 0 % (ref 0.0–0.2)

## 2020-12-20 MED ORDER — DIPHENHYDRAMINE HCL 50 MG/ML IJ SOLN
25.0000 mg | Freq: Once | INTRAMUSCULAR | Status: AC
  Administered 2020-12-20: 25 mg via INTRAVENOUS

## 2020-12-20 MED ORDER — SODIUM CHLORIDE 0.9 % IV SOLN
10.0000 mg | Freq: Once | INTRAVENOUS | Status: AC
  Administered 2020-12-20: 10 mg via INTRAVENOUS
  Filled 2020-12-20: qty 10

## 2020-12-20 MED ORDER — ACETAMINOPHEN 325 MG PO TABS
650.0000 mg | ORAL_TABLET | Freq: Once | ORAL | Status: AC
  Administered 2020-12-20: 650 mg via ORAL

## 2020-12-20 MED ORDER — TRASTUZUMAB-DKST CHEMO 150 MG IV SOLR
2.0000 mg/kg | Freq: Once | INTRAVENOUS | Status: AC
  Administered 2020-12-20: 231 mg via INTRAVENOUS
  Filled 2020-12-20: qty 11

## 2020-12-20 MED ORDER — SODIUM CHLORIDE 0.9% FLUSH
10.0000 mL | Freq: Once | INTRAVENOUS | Status: AC
  Administered 2020-12-20: 10 mL
  Filled 2020-12-20: qty 10

## 2020-12-20 MED ORDER — PACLITAXEL CHEMO INJECTION 300 MG/50ML
65.0000 mg/m2 | Freq: Once | INTRAVENOUS | Status: AC
  Administered 2020-12-20: 150 mg via INTRAVENOUS
  Filled 2020-12-20: qty 25

## 2020-12-20 MED ORDER — DIPHENHYDRAMINE HCL 50 MG/ML IJ SOLN
INTRAMUSCULAR | Status: AC
  Filled 2020-12-20: qty 1

## 2020-12-20 MED ORDER — SODIUM CHLORIDE 0.9 % IV SOLN
Freq: Once | INTRAVENOUS | Status: AC
Start: 2020-12-20 — End: 2020-12-20
  Filled 2020-12-20: qty 250

## 2020-12-20 MED ORDER — FAMOTIDINE 20 MG IN NS 100 ML IVPB
INTRAVENOUS | Status: AC
  Filled 2020-12-20: qty 100

## 2020-12-20 MED ORDER — DENOSUMAB 120 MG/1.7ML ~~LOC~~ SOLN
SUBCUTANEOUS | Status: AC
  Filled 2020-12-20: qty 1.7

## 2020-12-20 MED ORDER — FAMOTIDINE 20 MG IN NS 100 ML IVPB
20.0000 mg | Freq: Once | INTRAVENOUS | Status: AC
  Administered 2020-12-20: 20 mg via INTRAVENOUS

## 2020-12-20 MED ORDER — ACETAMINOPHEN 325 MG PO TABS
ORAL_TABLET | ORAL | Status: AC
  Filled 2020-12-20: qty 2

## 2020-12-20 NOTE — Assessment & Plan Note (Signed)
09/08/2020:Mammogram detected 0.7 cm mass in the right breast 1 o'clock position, right breast biopsy 1: Grade 2 IDC ER 95% PR 95%, Ki-67 30%, HER-2 positive ratio 2.83 T1b N0 stage Ia  10/16/20: Grade 3 IDC with DCIS 0/4 LNER 95% PR 95%, Ki-67 30%, HER-2 positive ratio 2.83  Treatment Plan: 1.adjuvant Taxol Herceptin followed by Herceptin maintenance for 1 yearplan to start in 3 weeks 2.Adjuvant radiation 3.Followed by adjuvant antiestrogen therapy -------------------------------------------------------------------------------------------------------------------------- Current Treatment: Cycle 6 Taxol Herceptin Chemo Toxicities:  Denies any adverse effects of chemotherapy.  Denies any nausea or vomiting. Alopecia Slight leukopenia: ANC today is 2. okay to proceed with treatment.  RTC weekly for chemo and q 2 weeks for follow ups

## 2020-12-26 NOTE — Telephone Encounter (Signed)
"  Grace Pennington [phone]) calling to let you know to send FMLA form.  Discussed with Dr. Lindi Adie who advised me to work 8:30 am - 2:00 pm, five days a week except for scheduled appointment days.  Monday, January 08, 2021 is the start date of reduced work schedule with intermittent leave for appointments."  Completed form for provider review and signature.

## 2020-12-27 ENCOUNTER — Other Ambulatory Visit (HOSPITAL_COMMUNITY): Payer: Self-pay

## 2020-12-27 MED ORDER — METFORMIN HCL 1000 MG PO TABS
ORAL_TABLET | ORAL | 3 refills | Status: DC
  Filled 2020-12-27: qty 180, 90d supply, fill #0
  Filled 2021-05-04: qty 180, 90d supply, fill #1

## 2020-12-29 ENCOUNTER — Other Ambulatory Visit (HOSPITAL_COMMUNITY): Payer: Self-pay

## 2020-12-29 ENCOUNTER — Other Ambulatory Visit: Payer: Self-pay

## 2020-12-29 ENCOUNTER — Inpatient Hospital Stay: Payer: No Typology Code available for payment source

## 2020-12-29 ENCOUNTER — Inpatient Hospital Stay: Payer: No Typology Code available for payment source | Attending: Hematology and Oncology

## 2020-12-29 VITALS — BP 105/80 | HR 86 | Temp 98.4°F | Resp 18

## 2020-12-29 DIAGNOSIS — Z5112 Encounter for antineoplastic immunotherapy: Secondary | ICD-10-CM | POA: Insufficient documentation

## 2020-12-29 DIAGNOSIS — I10 Essential (primary) hypertension: Secondary | ICD-10-CM | POA: Diagnosis not present

## 2020-12-29 DIAGNOSIS — Z5111 Encounter for antineoplastic chemotherapy: Secondary | ICD-10-CM | POA: Insufficient documentation

## 2020-12-29 DIAGNOSIS — Z170 Estrogen receptor positive status [ER+]: Secondary | ICD-10-CM

## 2020-12-29 DIAGNOSIS — Z79899 Other long term (current) drug therapy: Secondary | ICD-10-CM | POA: Diagnosis not present

## 2020-12-29 DIAGNOSIS — Z95828 Presence of other vascular implants and grafts: Secondary | ICD-10-CM

## 2020-12-29 DIAGNOSIS — C50211 Malignant neoplasm of upper-inner quadrant of right female breast: Secondary | ICD-10-CM | POA: Diagnosis present

## 2020-12-29 LAB — CMP (CANCER CENTER ONLY)
ALT: 29 U/L (ref 0–44)
AST: 23 U/L (ref 15–41)
Albumin: 4.1 g/dL (ref 3.5–5.0)
Alkaline Phosphatase: 69 U/L (ref 38–126)
Anion gap: 12 (ref 5–15)
BUN: 18 mg/dL (ref 6–20)
CO2: 23 mmol/L (ref 22–32)
Calcium: 10.1 mg/dL (ref 8.9–10.3)
Chloride: 104 mmol/L (ref 98–111)
Creatinine: 1.21 mg/dL — ABNORMAL HIGH (ref 0.44–1.00)
GFR, Estimated: 55 mL/min — ABNORMAL LOW (ref >60)
Glucose, Bld: 149 mg/dL — ABNORMAL HIGH (ref 70–99)
Potassium: 4.1 mmol/L (ref 3.5–5.1)
Sodium: 139 mmol/L (ref 135–145)
Total Bilirubin: 0.3 mg/dL (ref 0.3–1.2)
Total Protein: 7.7 g/dL (ref 6.5–8.1)

## 2020-12-29 LAB — CBC WITH DIFFERENTIAL (CANCER CENTER ONLY)
Abs Immature Granulocytes: 0.06 10*3/uL (ref 0.00–0.07)
Basophils Absolute: 0 10*3/uL (ref 0.0–0.1)
Basophils Relative: 0 %
Eosinophils Absolute: 0.1 10*3/uL (ref 0.0–0.5)
Eosinophils Relative: 2 %
HCT: 41.3 % (ref 36.0–46.0)
Hemoglobin: 14 g/dL (ref 12.0–15.0)
Immature Granulocytes: 1 %
Lymphocytes Relative: 34 %
Lymphs Abs: 2.3 10*3/uL (ref 0.7–4.0)
MCH: 29.8 pg (ref 26.0–34.0)
MCHC: 33.9 g/dL (ref 30.0–36.0)
MCV: 87.9 fL (ref 80.0–100.0)
Monocytes Absolute: 0.8 10*3/uL (ref 0.1–1.0)
Monocytes Relative: 11 %
Neutro Abs: 3.5 10*3/uL (ref 1.7–7.7)
Neutrophils Relative %: 52 %
Platelet Count: 311 10*3/uL (ref 150–400)
RBC: 4.7 MIL/uL (ref 3.87–5.11)
RDW: 15.1 % (ref 11.5–15.5)
WBC Count: 6.8 10*3/uL (ref 4.0–10.5)
nRBC: 0.4 % — ABNORMAL HIGH (ref 0.0–0.2)

## 2020-12-29 MED ORDER — FAMOTIDINE 20 MG IN NS 100 ML IVPB
20.0000 mg | Freq: Once | INTRAVENOUS | Status: AC
  Administered 2020-12-29: 20 mg via INTRAVENOUS

## 2020-12-29 MED ORDER — TRASTUZUMAB-DKST CHEMO 150 MG IV SOLR
2.0000 mg/kg | Freq: Once | INTRAVENOUS | Status: AC
  Administered 2020-12-29: 231 mg via INTRAVENOUS
  Filled 2020-12-29: qty 11

## 2020-12-29 MED ORDER — SODIUM CHLORIDE 0.9 % IV SOLN
10.0000 mg | Freq: Once | INTRAVENOUS | Status: AC
  Administered 2020-12-29: 10 mg via INTRAVENOUS
  Filled 2020-12-29: qty 10

## 2020-12-29 MED ORDER — DIPHENHYDRAMINE HCL 50 MG/ML IJ SOLN
INTRAMUSCULAR | Status: AC
  Filled 2020-12-29: qty 1

## 2020-12-29 MED ORDER — HEPARIN SOD (PORK) LOCK FLUSH 100 UNIT/ML IV SOLN
500.0000 [IU] | Freq: Once | INTRAVENOUS | Status: AC | PRN
  Administered 2020-12-29: 500 [IU]
  Filled 2020-12-29: qty 5

## 2020-12-29 MED ORDER — ACETAMINOPHEN 325 MG PO TABS
650.0000 mg | ORAL_TABLET | Freq: Once | ORAL | Status: AC
Start: 2020-12-29 — End: 2020-12-29
  Administered 2020-12-29: 650 mg via ORAL

## 2020-12-29 MED ORDER — SODIUM CHLORIDE 0.9 % IV SOLN
10.0000 mg | Freq: Once | INTRAVENOUS | Status: DC
  Filled 2020-12-29: qty 1

## 2020-12-29 MED ORDER — SODIUM CHLORIDE 0.9% FLUSH
10.0000 mL | Freq: Once | INTRAVENOUS | Status: AC
Start: 2020-12-29 — End: 2020-12-29
  Administered 2020-12-29: 10 mL
  Filled 2020-12-29: qty 10

## 2020-12-29 MED ORDER — DIPHENHYDRAMINE HCL 50 MG/ML IJ SOLN
25.0000 mg | Freq: Once | INTRAMUSCULAR | Status: AC
  Administered 2020-12-29: 25 mg via INTRAVENOUS

## 2020-12-29 MED ORDER — SODIUM CHLORIDE 0.9 % IV SOLN
Freq: Once | INTRAVENOUS | Status: AC
Start: 2020-12-29 — End: 2020-12-29
  Filled 2020-12-29: qty 250

## 2020-12-29 MED ORDER — FAMOTIDINE 20 MG IN NS 100 ML IVPB
INTRAVENOUS | Status: AC
  Filled 2020-12-29: qty 100

## 2020-12-29 MED ORDER — PACLITAXEL CHEMO INJECTION 300 MG/50ML
65.0000 mg/m2 | Freq: Once | INTRAVENOUS | Status: AC
  Administered 2020-12-29: 150 mg via INTRAVENOUS
  Filled 2020-12-29: qty 25

## 2020-12-29 MED ORDER — SODIUM CHLORIDE 0.9% FLUSH
10.0000 mL | INTRAVENOUS | Status: DC | PRN
  Administered 2020-12-29: 10 mL
  Filled 2020-12-29: qty 10

## 2020-12-29 MED ORDER — ACETAMINOPHEN 325 MG PO TABS
ORAL_TABLET | ORAL | Status: AC
  Filled 2020-12-29: qty 2

## 2020-12-29 NOTE — Patient Instructions (Signed)
[facility]  Discharge Instructions: Thank you for choosing [facility] to provide your oncology and hematology care.   If you have a lab appointment with the [facility], please go directly to the [facility] and check in at the registration area.   Wear comfortable clothing and clothing appropriate for easy access to any Portacath or PICC line.   We strive to give you quality time with your provider. You may need to reschedule your appointment if you arrive late (15 or more minutes).  Arriving late affects you and other patients whose appointments are after yours.  Also, if you miss three or more appointments without notifying the office, you may be dismissed from the clinic at the provider's discretion.      For prescription refill requests, have your pharmacy contact our office and allow 72 hours for refills to be completed.    Today you received the following chemotherapy and/or immunotherapy agents Ogivri and Taxol   To help prevent nausea and vomiting after your treatment, we encourage you to take your nausea medication as directed.  BELOW ARE SYMPTOMS THAT SHOULD BE REPORTED IMMEDIATELY: . *FEVER GREATER THAN 100.4 F (38 C) OR HIGHER . *CHILLS OR SWEATING . *NAUSEA AND VOMITING THAT IS NOT CONTROLLED WITH YOUR NAUSEA MEDICATION . *UNUSUAL SHORTNESS OF BREATH . *UNUSUAL BRUISING OR BLEEDING . *URINARY PROBLEMS (pain or burning when urinating, or frequent urination) . *BOWEL PROBLEMS (unusual diarrhea, constipation, pain near the anus) . TENDERNESS IN MOUTH AND THROAT WITH OR WITHOUT PRESENCE OF ULCERS (sore throat, sores in mouth, or a toothache) . UNUSUAL RASH, SWELLING OR PAIN  . UNUSUAL VAGINAL DISCHARGE OR ITCHING   Items with * indicate a potential emergency and should be followed up as soon as possible or go to the Emergency Department if any problems should occur.  Please show the CHEMOTHERAPY ALERT CARD or IMMUNOTHERAPY  ALERT CARD at check-in to the Emergency Department and triage nurse.  Should you have questions after your visit or need to cancel or reschedule your appointment, please contact [facility]  Dept: [phone]  and follow the prompts.  Office hours are 8:00 a.m. to 4:30 p.m. Monday - Friday. Please note that voicemails left after 4:00 p.m. may not be returned until the following business day.  We are closed weekends and major holidays. You have access to a nurse at all times for urgent questions. Please call the main number to the clinic Dept: [phone] and follow the prompts.   For any non-urgent questions, you may also contact your provider using MyChart. We now offer e-Visits for anyone 56 and older to request care online for non-urgent symptoms. For details visit [email or web address].   Also download the MyChart app! Go to the app store, search "MyChart", open the app, select [facility], and log in with your MyChart username and password.  Due to Covid, a mask is required upon entering the hospital/clinic. If you do not have a mask, one will be given to you upon arrival. For doctor visits, patients may have 1 support person aged 29 or older with them. For treatment visits, patients cannot have anyone with them due to current Covid guidelines and our immunocompromised population.

## 2021-01-04 NOTE — Progress Notes (Signed)
Patient Care Team: Seward Carol, MD as PCP - General (Internal Medicine)  DIAGNOSIS:    ICD-10-CM   1. Malignant neoplasm of upper-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, estrogen receptor positive ([facility])  C50.211    Z17.0       SUMMARY OF ONCOLOGIC HISTORY: Oncology History  Malignant neoplasm of upper-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, estrogen receptor positive ([facility])  09/08/2020 Cancer Staging   Staging form: Breast, AJCC 8th Edition - Clinical stage from 09/08/2020: Stage IA (cT1b, cN0, cM0, G2, ER+, PR+, HER2+) - Signed by Gardenia Phlegm, NP on 09/20/2020  Stage prefix: Initial diagnosis    09/08/2020 Initial Diagnosis   Mammogram detected 0.7 cm mass in the right breast 1 o'clock position, right breast biopsy 1: Grade 2 IDC ER 95% PR 95%, Ki-67 30%, HER-2 positive ratio 2.83   10/16/2020 Surgery   Right lumpectomy [name]): IDC, 1.0cm, grade 3, with high grade DCIS, clear margins, 4 right axillary lymph nodes negative for carcinoma.   10/16/2020 Cancer Staging   Staging form: Breast, AJCC 8th Edition - Pathologic stage from 10/16/2020: Stage IA (pT1b, pN0, cM0, G3, ER+, PR+, HER2+) - Signed by Gardenia Phlegm, NP on 11/01/2020  Histologic grading system: 3 grade system    11/13/2020 -  Chemotherapy    Patient is on Treatment Plan: BREAST PACLITAXEL + TRASTUZUMAB Q7D / TRASTUZUMAB Q21D         CHIEF COMPLIANT: Cycle 8 Taxol Herceptin  INTERVAL HISTORY: [facility] is a 50 y.o. with above-mentioned history of  right breast cancer who underwent a right lumpectomy and is currently on adjuvant chemotherapy with Taxol Herceptin. She presents to the clinic today for treatment.  Overall she is tolerating her treatment extremely well without any problems or concerns but denies any nausea or vomiting.  Denies any neuropathy.  ALLERGIES:  has No Known Allergies.  MEDICATIONS:  Current Outpatient Medications  Medication Sig Dispense Refill    ALPRAZolam (XANAX) 0.25 MG tablet TAKE 1 TABLET BY MOUTH ONCE A DAY AS NEEDED 30 tablet 3   citalopram (CELEXA) 10 MG tablet TAKE 1 TABLET BY MOUTH ONCE DAILY 90 tablet 3   fluticasone (FLONASE) 50 MCG/ACT nasal spray Place 2 sprays into both nostrils daily as needed for allergies.     hydrochlorothiazide (HYDRODIURIL) 25 MG tablet TAKE 1 TABLET BY MOUTH ONCE DAILY 90 tablet 3   hydroxypropyl methylcellulose / hypromellose (ISOPTO TEARS / GONIOVISC) 2.5 % ophthalmic solution Place 1 drop into both eyes 3 (three) times daily as needed (dry/irritated eyes).     lidocaine-prilocaine (EMLA) cream APPLY TO AFFECTED AREA ONCE 30 g 3   loratadine (CLARITIN) 10 MG tablet Take 10 mg by mouth daily as needed for allergies.     metFORMIN (GLUCOPHAGE) 1000 MG tablet Take 1/2 tablet by mouth twice a day wth food for 2 weeks then increase to 1 tablet twice a day 180 tablet 3   Multiple Vitamins-Minerals (MULTIVITAMIN WITH MINERALS) tablet Take 1 tablet by mouth daily.     ondansetron (ZOFRAN) 8 MG tablet TAKE 1 TABLET BY MOUTH 2 TIMES DAILY AS NEEDED FOR NAUSEA OR VOMITING 30 tablet 1   pantoprazole (PROTONIX) 40 MG tablet TAKE 1 TABLET BY MOUTH DAILY 90 tablet 1   prochlorperazine (COMPAZINE) 10 MG tablet TAKE 1 TABLET BY MOUTH EVERY 6 HOURS AS NEEDED FOR NAUSEA OR VOMITING 30 tablet 1   rosuvastatin (CRESTOR) 10 MG tablet TAKE 1 TABLET BY MOUTH ONCE A DAY 90  tablet 3   spironolactone (ALDACTONE) 50 MG tablet TAKE 1 TABLET BY MOUTH 2 TIMES DAILY 60 tablet 12   No current facility-administered medications for this visit.    PHYSICAL EXAMINATION: ECOG PERFORMANCE STATUS: 0 - Asymptomatic  Vitals:   01/05/21 0814  BP: 106/83  Pulse: 88  Resp: 19  Temp: 97.7 F (36.5 C)  SpO2: 97%   Filed Weights   01/05/21 0814  Weight: 239 lb 12.8 oz (108.8 kg)    LABORATORY DATA:  I have reviewed the data as listed CMP Latest Ref Rng & Units 01/05/2021 12/29/2020 12/20/2020  Glucose 70 - 99 mg/dL 152(H) 149(H)  183(H)  BUN 6 - 20 mg/dL 15 18 14   Creatinine 0.44 - 1.00 mg/dL 1.04(H) 1.21(H) 0.96  Sodium 135 - 145 mmol/L 139 139 139  Potassium 3.5 - 5.1 mmol/L 4.0 4.1 4.1  Chloride 98 - 111 mmol/L 107 104 103  CO2 22 - 32 mmol/L 23 23 27   Calcium 8.9 - 10.3 mg/dL 9.6 10.1 9.5  Total Protein 6.5 - 8.1 g/dL 7.3 7.7 7.3  Total Bilirubin 0.3 - 1.2 mg/dL 0.5 0.3 0.3  Alkaline Phos 38 - 126 U/L 61 69 78  AST 15 - 41 U/L 21 23 30   ALT 0 - 44 U/L 27 29 59(H)    Lab Results  Component Value Date   WBC 6.4 01/05/2021   HGB 13.0 01/05/2021   HCT 38.0 01/05/2021   MCV 87.6 01/05/2021   PLT 279 01/05/2021   NEUTROABS 3.4 01/05/2021    ASSESSMENT & PLAN:  Malignant neoplasm of upper-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, estrogen receptor positive ([facility]) 09/08/2020:Mammogram detected 0.7 cm mass in the right breast 1 o'clock position, right breast biopsy 1: Grade 2 IDC ER 95% PR 95%, Ki-67 30%, HER-2 positive ratio 2.83 T1b N0 stage Ia   10/16/20: Grade 3 IDC with DCIS 0/4 LN  ER 95% PR 95%, Ki-67 30%, HER-2 positive ratio 2.83   Treatment Plan: 1. adjuvant Taxol Herceptin followed by Herceptin maintenance for 1 year plan to start in 3 weeks 2.  Adjuvant radiation 3.  Followed by adjuvant antiestrogen therapy -------------------------------------------------------------------------------------------------------------------------- Current Treatment: Cycle 8 Taxol Herceptin Chemo Toxicities:  Denies any adverse effects of chemotherapy.  Denies any nausea or vomiting. Alopecia Monitoring closely for peripheral neuropathy. RTC weekly for chemo and q 2 weeks for follow ups    No orders of the defined types were placed in this encounter.  The patient has a good understanding of the overall plan. she agrees with it. she will call with any problems that may develop before the next visit here.  Total time spent: 30 mins including face to face time and time spent for planning, charting and  coordination of care  Rulon Eisenmenger, MD, MPH 01/05/2021  I, Molly Dorshimer, am acting as scribe for Dr. Nicholas Lose.  I have reviewed the above documentation for accuracy and completeness, and I agree with the above.

## 2021-01-04 NOTE — Assessment & Plan Note (Signed)
09/08/2020:Mammogram detected 0.7 cm mass in the right breast 1 o'clock position, right breast biopsy 1: Grade 2 IDC ER 95% PR 95%, Ki-67 30%, HER-2 positive ratio 2.83 T1b N0 stage Ia  10/16/20: Grade 3 IDC with DCIS 0/4 LNER 95% PR 95%, Ki-67 30%, HER-2 positive ratio 2.83  Treatment Plan: 1.adjuvant Taxol Herceptin followed by Herceptin maintenance for 1 yearplan to start in 3 weeks 2.Adjuvant radiation 3.Followed by adjuvant antiestrogen therapy -------------------------------------------------------------------------------------------------------------------------- Current Treatment: Cycle8Taxol Herceptin Chemo Toxicities: Denies any adverse effects of chemotherapy. Denies any nausea or vomiting. Alopecia Monitoring closely for peripheral neuropathy. RTC weekly for chemo and q 2 weeks for follow ups

## 2021-01-05 ENCOUNTER — Encounter: Payer: Self-pay | Admitting: Hematology and Oncology

## 2021-01-05 ENCOUNTER — Other Ambulatory Visit: Payer: Self-pay

## 2021-01-05 ENCOUNTER — Inpatient Hospital Stay: Payer: No Typology Code available for payment source

## 2021-01-05 ENCOUNTER — Ambulatory Visit: Payer: No Typology Code available for payment source

## 2021-01-05 ENCOUNTER — Inpatient Hospital Stay (HOSPITAL_BASED_OUTPATIENT_CLINIC_OR_DEPARTMENT_OTHER): Payer: No Typology Code available for payment source | Admitting: Hematology and Oncology

## 2021-01-05 DIAGNOSIS — Z170 Estrogen receptor positive status [ER+]: Secondary | ICD-10-CM

## 2021-01-05 DIAGNOSIS — C50211 Malignant neoplasm of upper-inner quadrant of right female breast: Secondary | ICD-10-CM

## 2021-01-05 DIAGNOSIS — Z95828 Presence of other vascular implants and grafts: Secondary | ICD-10-CM

## 2021-01-05 DIAGNOSIS — Z5112 Encounter for antineoplastic immunotherapy: Secondary | ICD-10-CM | POA: Diagnosis not present

## 2021-01-05 LAB — CMP (CANCER CENTER ONLY)
ALT: 27 U/L (ref 0–44)
AST: 21 U/L (ref 15–41)
Albumin: 3.8 g/dL (ref 3.5–5.0)
Alkaline Phosphatase: 61 U/L (ref 38–126)
Anion gap: 9 (ref 5–15)
BUN: 15 mg/dL (ref 6–20)
CO2: 23 mmol/L (ref 22–32)
Calcium: 9.6 mg/dL (ref 8.9–10.3)
Chloride: 107 mmol/L (ref 98–111)
Creatinine: 1.04 mg/dL — ABNORMAL HIGH (ref 0.44–1.00)
GFR, Estimated: >60 mL/min (ref >60)
Glucose, Bld: 152 mg/dL — ABNORMAL HIGH (ref 70–99)
Potassium: 4 mmol/L (ref 3.5–5.1)
Sodium: 139 mmol/L (ref 135–145)
Total Bilirubin: 0.5 mg/dL (ref 0.3–1.2)
Total Protein: 7.3 g/dL (ref 6.5–8.1)

## 2021-01-05 LAB — CBC WITH DIFFERENTIAL (CANCER CENTER ONLY)
Abs Immature Granulocytes: 0.03 10*3/uL (ref 0.00–0.07)
Basophils Absolute: 0.1 10*3/uL (ref 0.0–0.1)
Basophils Relative: 1 %
Eosinophils Absolute: 0.2 10*3/uL (ref 0.0–0.5)
Eosinophils Relative: 3 %
HCT: 38 % (ref 36.0–46.0)
Hemoglobin: 13 g/dL (ref 12.0–15.0)
Immature Granulocytes: 1 %
Lymphocytes Relative: 34 %
Lymphs Abs: 2.2 10*3/uL (ref 0.7–4.0)
MCH: 30 pg (ref 26.0–34.0)
MCHC: 34.2 g/dL (ref 30.0–36.0)
MCV: 87.6 fL (ref 80.0–100.0)
Monocytes Absolute: 0.5 10*3/uL (ref 0.1–1.0)
Monocytes Relative: 8 %
Neutro Abs: 3.4 10*3/uL (ref 1.7–7.7)
Neutrophils Relative %: 53 %
Platelet Count: 279 10*3/uL (ref 150–400)
RBC: 4.34 MIL/uL (ref 3.87–5.11)
RDW: 15.7 % — ABNORMAL HIGH (ref 11.5–15.5)
WBC Count: 6.4 10*3/uL (ref 4.0–10.5)
nRBC: 0.3 % — ABNORMAL HIGH (ref 0.0–0.2)

## 2021-01-05 MED ORDER — DIPHENHYDRAMINE HCL 50 MG/ML IJ SOLN
25.0000 mg | Freq: Once | INTRAMUSCULAR | Status: AC
  Administered 2021-01-05: 25 mg via INTRAVENOUS

## 2021-01-05 MED ORDER — HEPARIN SOD (PORK) LOCK FLUSH 100 UNIT/ML IV SOLN
500.0000 [IU] | Freq: Once | INTRAVENOUS | Status: AC | PRN
  Administered 2021-01-05: 500 [IU]
  Filled 2021-01-05: qty 5

## 2021-01-05 MED ORDER — SODIUM CHLORIDE 0.9 % IV SOLN
65.0000 mg/m2 | Freq: Once | INTRAVENOUS | Status: AC
  Administered 2021-01-05: 150 mg via INTRAVENOUS
  Filled 2021-01-05: qty 25

## 2021-01-05 MED ORDER — DIPHENHYDRAMINE HCL 50 MG/ML IJ SOLN
INTRAMUSCULAR | Status: AC
  Filled 2021-01-05: qty 1

## 2021-01-05 MED ORDER — FAMOTIDINE 20 MG IN NS 100 ML IVPB
INTRAVENOUS | Status: AC
  Filled 2021-01-05: qty 100

## 2021-01-05 MED ORDER — ACETAMINOPHEN 325 MG PO TABS
ORAL_TABLET | ORAL | Status: AC
  Filled 2021-01-05: qty 2

## 2021-01-05 MED ORDER — SODIUM CHLORIDE 0.9 % IV SOLN
Freq: Once | INTRAVENOUS | Status: AC
  Filled 2021-01-05: qty 250

## 2021-01-05 MED ORDER — ACETAMINOPHEN 325 MG PO TABS
650.0000 mg | ORAL_TABLET | Freq: Once | ORAL | Status: AC
Start: 2021-01-05 — End: 2021-01-05
  Administered 2021-01-05: 650 mg via ORAL

## 2021-01-05 MED ORDER — SODIUM CHLORIDE 0.9% FLUSH
10.0000 mL | INTRAVENOUS | Status: DC | PRN
  Administered 2021-01-05: 10 mL
  Filled 2021-01-05: qty 10

## 2021-01-05 MED ORDER — SODIUM CHLORIDE 0.9 % IV SOLN
10.0000 mg | Freq: Once | INTRAVENOUS | Status: AC
  Administered 2021-01-05: 10 mg via INTRAVENOUS
  Filled 2021-01-05: qty 10

## 2021-01-05 MED ORDER — SODIUM CHLORIDE 0.9% FLUSH
10.0000 mL | Freq: Once | INTRAVENOUS | Status: AC
  Administered 2021-01-05: 10 mL
  Filled 2021-01-05: qty 10

## 2021-01-05 MED ORDER — TRASTUZUMAB-DKST CHEMO 150 MG IV SOLR
2.0000 mg/kg | Freq: Once | INTRAVENOUS | Status: AC
  Administered 2021-01-05: 231 mg via INTRAVENOUS
  Filled 2021-01-05: qty 11

## 2021-01-05 MED ORDER — FAMOTIDINE 20 MG IN NS 100 ML IVPB
20.0000 mg | Freq: Once | INTRAVENOUS | Status: AC
Start: 2021-01-05 — End: 2021-01-05
  Administered 2021-01-05: 20 mg via INTRAVENOUS

## 2021-01-05 NOTE — Patient Instructions (Signed)
[facility]COLOG[facility]  Discharge Instructions: Thank you for choosing [facility] to provide your oncology and hematology care.   If you have a lab appointment with the [facility], please go directly to the [facility] and check in at the registration area.   Wear comfortable clothing and clothing appropriate for easy access to any Portacath or PICC line.   We strive to give you quality time with your provider. You may need to reschedule your appointment if you arrive late (15 or more minutes).  Arriving late affects you and other patients whose appointments are after yours.  Also, if you miss three or more appointments without notifying the office, you may be dismissed from the clinic at the provider's discretion.      For prescription refill requests, have your pharmacy contact our office and allow 72 hours for refills to be completed.    Today you received the following chemotherapy and/or immunotherapy agents herceptin/taxol      To help prevent nausea and vomiting after your treatment, we encourage you to take your nausea medication as directed.  BELOW ARE SYMPTOMS THAT SHOULD BE REPORTED IMMEDIATELY: *FEVER GREATER THAN 100.4 F (38 C) OR HIGHER *CHILLS OR SWEATING *NAUSEA AND VOMITING THAT IS NOT CONTROLLED WITH YOUR NAUSEA MEDICATION *UNUSUAL SHORTNESS OF BREATH *UNUSUAL BRUISING OR BLEEDING *URINARY PROBLEMS (pain or burning when urinating, or frequent urination) *BOWEL PROBLEMS (unusual diarrhea, constipation, pain near the anus) TENDERNESS IN MOUTH AND THROAT WITH OR WITHOUT PRESENCE OF ULCERS (sore throat, sores in mouth, or a toothache) UNUSUAL RASH, SWELLING OR PAIN  UNUSUAL VAGINAL DISCHARGE OR ITCHING   Items with * indicate a potential emergency and should be followed up as soon as possible or go to the Emergency Department if any problems should occur.  Please show the CHEMOTHERAPY ALERT CARD or IMMUNOTHERAPY ALERT CARD at  check-in to the Emergency Department and triage nurse.  Should you have questions after your visit or need to cancel or reschedule your appointment, please contact [facility]  Dept: [phone]  and follow the prompts.  Office hours are 8:00 a.m. to 4:30 p.m. Monday - Friday. Please note that voicemails left after 4:00 p.m. may not be returned until the following business day.  We are closed weekends and major holidays. You have access to a nurse at all times for urgent questions. Please call the main number to the clinic Dept: [phone] and follow the prompts.   For any non-urgent questions, you may also contact your provider using MyChart. We now offer e-Visits for anyone 32 and older to request care online for non-urgent symptoms. For details visit [email or web address].   Also download the MyChart app! Go to the app store, search "MyChart", open the app, select [facility], and log in with your MyChart username and password.  Due to Covid, a mask is required upon entering the hospital/clinic. If you do not have a mask, one will be given to you upon arrival. For doctor visits, patients may have 1 support person aged 23 or older with them. For treatment visits, patients cannot have anyone with them due to current Covid guidelines and our immunocompromised population.

## 2021-01-12 ENCOUNTER — Other Ambulatory Visit: Payer: Self-pay

## 2021-01-12 ENCOUNTER — Inpatient Hospital Stay: Payer: No Typology Code available for payment source

## 2021-01-12 VITALS — BP 101/69 | HR 82 | Temp 98.5°F | Resp 16 | Wt 259.0 lb

## 2021-01-12 DIAGNOSIS — C50211 Malignant neoplasm of upper-inner quadrant of right female breast: Secondary | ICD-10-CM

## 2021-01-12 DIAGNOSIS — Z170 Estrogen receptor positive status [ER+]: Secondary | ICD-10-CM

## 2021-01-12 DIAGNOSIS — Z5112 Encounter for antineoplastic immunotherapy: Secondary | ICD-10-CM | POA: Diagnosis not present

## 2021-01-12 DIAGNOSIS — Z95828 Presence of other vascular implants and grafts: Secondary | ICD-10-CM

## 2021-01-12 LAB — CMP (CANCER CENTER ONLY)
ALT: 28 U/L (ref 0–44)
AST: 21 U/L (ref 15–41)
Albumin: 3.8 g/dL (ref 3.5–5.0)
Alkaline Phosphatase: 45 U/L (ref 38–126)
Anion gap: 6 (ref 5–15)
BUN: 13 mg/dL (ref 6–20)
CO2: 25 mmol/L (ref 22–32)
Calcium: 8.5 mg/dL — ABNORMAL LOW (ref 8.9–10.3)
Chloride: 107 mmol/L (ref 98–111)
Creatinine: 1.05 mg/dL — ABNORMAL HIGH (ref 0.44–1.00)
GFR, Estimated: >60 mL/min (ref >60)
Glucose, Bld: 112 mg/dL — ABNORMAL HIGH (ref 70–99)
Potassium: 3.6 mmol/L (ref 3.5–5.1)
Sodium: 138 mmol/L (ref 135–145)
Total Bilirubin: 0.4 mg/dL (ref 0.3–1.2)
Total Protein: 6.7 g/dL (ref 6.5–8.1)

## 2021-01-12 LAB — CBC WITH DIFFERENTIAL (CANCER CENTER ONLY)
Abs Immature Granulocytes: 0.03 10*3/uL (ref 0.00–0.07)
Basophils Absolute: 0 10*3/uL (ref 0.0–0.1)
Basophils Relative: 1 %
Eosinophils Absolute: 0.1 10*3/uL (ref 0.0–0.5)
Eosinophils Relative: 2 %
HCT: 39.1 % (ref 36.0–46.0)
Hemoglobin: 13.4 g/dL (ref 12.0–15.0)
Immature Granulocytes: 1 %
Lymphocytes Relative: 37 %
Lymphs Abs: 1.9 10*3/uL (ref 0.7–4.0)
MCH: 30 pg (ref 26.0–34.0)
MCHC: 34.3 g/dL (ref 30.0–36.0)
MCV: 87.7 fL (ref 80.0–100.0)
Monocytes Absolute: 0.4 10*3/uL (ref 0.1–1.0)
Monocytes Relative: 8 %
Neutro Abs: 2.7 10*3/uL (ref 1.7–7.7)
Neutrophils Relative %: 51 %
Platelet Count: 287 10*3/uL (ref 150–400)
RBC: 4.46 MIL/uL (ref 3.87–5.11)
RDW: 16 % — ABNORMAL HIGH (ref 11.5–15.5)
WBC Count: 5.2 10*3/uL (ref 4.0–10.5)
nRBC: 0 % (ref 0.0–0.2)

## 2021-01-12 MED ORDER — FAMOTIDINE 20 MG IN NS 100 ML IVPB
20.0000 mg | Freq: Once | INTRAVENOUS | Status: AC
  Administered 2021-01-12: 20 mg via INTRAVENOUS

## 2021-01-12 MED ORDER — ACETAMINOPHEN 325 MG PO TABS
ORAL_TABLET | ORAL | Status: AC
  Filled 2021-01-12: qty 2

## 2021-01-12 MED ORDER — SODIUM CHLORIDE 0.9% FLUSH
10.0000 mL | INTRAVENOUS | Status: DC | PRN
  Administered 2021-01-12: 10 mL
  Filled 2021-01-12: qty 10

## 2021-01-12 MED ORDER — SODIUM CHLORIDE 0.9 % IV SOLN
Freq: Once | INTRAVENOUS | Status: AC
  Filled 2021-01-12: qty 250

## 2021-01-12 MED ORDER — DIPHENHYDRAMINE HCL 50 MG/ML IJ SOLN
25.0000 mg | Freq: Once | INTRAMUSCULAR | Status: AC
  Administered 2021-01-12: 25 mg via INTRAVENOUS

## 2021-01-12 MED ORDER — SODIUM CHLORIDE 0.9% FLUSH
10.0000 mL | Freq: Once | INTRAVENOUS | Status: AC
  Administered 2021-01-12: 10 mL
  Filled 2021-01-12: qty 10

## 2021-01-12 MED ORDER — TRASTUZUMAB-DKST CHEMO 150 MG IV SOLR
2.0000 mg/kg | Freq: Once | INTRAVENOUS | Status: AC
  Administered 2021-01-12: 231 mg via INTRAVENOUS
  Filled 2021-01-12: qty 11

## 2021-01-12 MED ORDER — DEXAMETHASONE SODIUM PHOSPHATE 100 MG/10ML IJ SOLN
10.0000 mg | Freq: Once | INTRAMUSCULAR | Status: AC
  Administered 2021-01-12: 10 mg via INTRAVENOUS
  Filled 2021-01-12: qty 10

## 2021-01-12 MED ORDER — ACETAMINOPHEN 325 MG PO TABS
650.0000 mg | ORAL_TABLET | Freq: Once | ORAL | Status: AC
  Administered 2021-01-12: 650 mg via ORAL

## 2021-01-12 MED ORDER — FAMOTIDINE 20 MG IN NS 100 ML IVPB
INTRAVENOUS | Status: AC
  Filled 2021-01-12: qty 100

## 2021-01-12 MED ORDER — HEPARIN SOD (PORK) LOCK FLUSH 100 UNIT/ML IV SOLN
500.0000 [IU] | Freq: Once | INTRAVENOUS | Status: AC | PRN
  Administered 2021-01-12: 500 [IU]
  Filled 2021-01-12: qty 5

## 2021-01-12 MED ORDER — DIPHENHYDRAMINE HCL 50 MG/ML IJ SOLN
INTRAMUSCULAR | Status: AC
  Filled 2021-01-12: qty 1

## 2021-01-12 MED ORDER — SODIUM CHLORIDE 0.9 % IV SOLN
65.0000 mg/m2 | Freq: Once | INTRAVENOUS | Status: AC
  Administered 2021-01-12: 150 mg via INTRAVENOUS
  Filled 2021-01-12: qty 25

## 2021-01-12 NOTE — Patient Instructions (Signed)
[facility] ONCOLOGY  Discharge Instructions: Thank you for choosing [facility] to provide your oncology and hematology care.   If you have a lab appointment with the [facility], please go directly to the [facility] and check in at the registration area.   Wear comfortable clothing and clothing appropriate for easy access to any Portacath or PICC line.   We strive to give you quality time with your provider. You may need to reschedule your appointment if you arrive late (15 or more minutes).  Arriving late affects you and other patients whose appointments are after yours.  Also, if you miss three or more appointments without notifying the office, you may be dismissed from the clinic at the provider's discretion.      For prescription refill requests, have your pharmacy contact our office and allow 72 hours for refills to be completed.    Today you received the following chemotherapy and/or immunotherapy agents herceptin/taxol      To help prevent nausea and vomiting after your treatment, we encourage you to take your nausea medication as directed.  BELOW ARE SYMPTOMS THAT SHOULD BE REPORTED IMMEDIATELY: *FEVER GREATER THAN 100.4 F (38 C) OR HIGHER *CHILLS OR SWEATING *NAUSEA AND VOMITING THAT IS NOT CONTROLLED WITH YOUR NAUSEA MEDICATION *UNUSUAL SHORTNESS OF BREATH *UNUSUAL BRUISING OR BLEEDING *URINARY PROBLEMS (pain or burning when urinating, or frequent urination) *BOWEL PROBLEMS (unusual diarrhea, constipation, pain near the anus) TENDERNESS IN MOUTH AND THROAT WITH OR WITHOUT PRESENCE OF ULCERS (sore throat, sores in mouth, or a toothache) UNUSUAL RASH, SWELLING OR PAIN  UNUSUAL VAGINAL DISCHARGE OR ITCHING   Items with * indicate a potential emergency and should be followed up as soon as possible or go to the Emergency Department if any problems should occur.  Please show the CHEMOTHERAPY ALERT CARD or IMMUNOTHERAPY ALERT CARD at  check-in to the Emergency Department and triage nurse.  Should you have questions after your visit or need to cancel or reschedule your appointment, please contact [facility]  Dept: [phone]  and follow the prompts.  Office hours are 8:00 a.m. to 4:30 p.m. Monday - Friday. Please note that voicemails left after 4:00 p.m. may not be returned until the following business day.  We are closed weekends and major holidays. You have access to a nurse at all times for urgent questions. Please call the main number to the clinic Dept: [phone] and follow the prompts.   For any non-urgent questions, you may also contact your provider using MyChart. We now offer e-Visits for anyone 70 and older to request care online for non-urgent symptoms. For details visit [email or web address].   Also download the MyChart app! Go to the app store, search "MyChart", open the app, select [facility], and log in with your MyChart username and password.  Due to Covid, a mask is required upon entering the hospital/clinic. If you do not have a mask, one will be given to you upon arrival. For doctor visits, patients may have 1 support person aged 5 or older with them. For treatment visits, patients cannot have anyone with them due to current Covid guidelines and our immunocompromised population.

## 2021-01-18 ENCOUNTER — Encounter: Payer: Self-pay | Admitting: *Deleted

## 2021-01-18 DIAGNOSIS — Z170 Estrogen receptor positive status [ER+]: Secondary | ICD-10-CM

## 2021-01-18 DIAGNOSIS — C50211 Malignant neoplasm of upper-inner quadrant of right female breast: Secondary | ICD-10-CM

## 2021-01-19 ENCOUNTER — Other Ambulatory Visit: Payer: Self-pay | Admitting: *Deleted

## 2021-01-19 ENCOUNTER — Telehealth: Payer: Self-pay | Admitting: *Deleted

## 2021-01-19 ENCOUNTER — Ambulatory Visit: Payer: No Typology Code available for payment source

## 2021-01-19 ENCOUNTER — Telehealth: Payer: Self-pay | Admitting: Adult Health

## 2021-01-19 ENCOUNTER — Inpatient Hospital Stay (HOSPITAL_BASED_OUTPATIENT_CLINIC_OR_DEPARTMENT_OTHER): Payer: No Typology Code available for payment source | Admitting: Adult Health

## 2021-01-19 ENCOUNTER — Encounter: Payer: Self-pay | Admitting: Adult Health

## 2021-01-19 ENCOUNTER — Inpatient Hospital Stay: Payer: No Typology Code available for payment source

## 2021-01-19 ENCOUNTER — Other Ambulatory Visit: Payer: Self-pay

## 2021-01-19 ENCOUNTER — Telehealth: Payer: Self-pay

## 2021-01-19 VITALS — BP 112/80 | HR 100 | Temp 97.8°F | Resp 18 | Ht 66.0 in | Wt 258.0 lb

## 2021-01-19 DIAGNOSIS — Z170 Estrogen receptor positive status [ER+]: Secondary | ICD-10-CM | POA: Diagnosis not present

## 2021-01-19 DIAGNOSIS — R350 Frequency of micturition: Secondary | ICD-10-CM

## 2021-01-19 DIAGNOSIS — Z5112 Encounter for antineoplastic immunotherapy: Secondary | ICD-10-CM | POA: Diagnosis not present

## 2021-01-19 DIAGNOSIS — C50211 Malignant neoplasm of upper-inner quadrant of right female breast: Secondary | ICD-10-CM | POA: Diagnosis not present

## 2021-01-19 LAB — CBC WITH DIFFERENTIAL (CANCER CENTER ONLY)
Abs Immature Granulocytes: 0.03 10*3/uL (ref 0.00–0.07)
Basophils Absolute: 0 10*3/uL (ref 0.0–0.1)
Basophils Relative: 1 %
Eosinophils Absolute: 0.1 10*3/uL (ref 0.0–0.5)
Eosinophils Relative: 1 %
HCT: 38.1 % (ref 36.0–46.0)
Hemoglobin: 13.1 g/dL (ref 12.0–15.0)
Immature Granulocytes: 1 %
Lymphocytes Relative: 34 %
Lymphs Abs: 2 10*3/uL (ref 0.7–4.0)
MCH: 30.8 pg (ref 26.0–34.0)
MCHC: 34.4 g/dL (ref 30.0–36.0)
MCV: 89.4 fL (ref 80.0–100.0)
Monocytes Absolute: 0.6 10*3/uL (ref 0.1–1.0)
Monocytes Relative: 10 %
Neutro Abs: 3.2 10*3/uL (ref 1.7–7.7)
Neutrophils Relative %: 53 %
Platelet Count: 267 10*3/uL (ref 150–400)
RBC: 4.26 MIL/uL (ref 3.87–5.11)
RDW: 16.5 % — ABNORMAL HIGH (ref 11.5–15.5)
WBC Count: 5.9 10*3/uL (ref 4.0–10.5)
nRBC: 0.3 % — ABNORMAL HIGH (ref 0.0–0.2)

## 2021-01-19 LAB — CMP (CANCER CENTER ONLY)
ALT: 29 U/L (ref 0–44)
AST: 18 U/L (ref 15–41)
Albumin: 3.7 g/dL (ref 3.5–5.0)
Alkaline Phosphatase: 59 U/L (ref 38–126)
Anion gap: 9 (ref 5–15)
BUN: 18 mg/dL (ref 6–20)
CO2: 23 mmol/L (ref 22–32)
Calcium: 9.4 mg/dL (ref 8.9–10.3)
Chloride: 108 mmol/L (ref 98–111)
Creatinine: 0.93 mg/dL (ref 0.44–1.00)
GFR, Estimated: >60 mL/min (ref >60)
Glucose, Bld: 121 mg/dL — ABNORMAL HIGH (ref 70–99)
Potassium: 4.2 mmol/L (ref 3.5–5.1)
Sodium: 140 mmol/L (ref 135–145)
Total Bilirubin: 0.3 mg/dL (ref 0.3–1.2)
Total Protein: 7.3 g/dL (ref 6.5–8.1)

## 2021-01-19 LAB — URINALYSIS, COMPLETE (UACMP) WITH MICROSCOPIC
Bacteria, UA: NONE SEEN
Bilirubin Urine: NEGATIVE
Glucose, UA: NEGATIVE mg/dL
Hgb urine dipstick: NEGATIVE
Ketones, ur: 5 mg/dL — AB
Leukocytes,Ua: NEGATIVE
Nitrite: NEGATIVE
Protein, ur: NEGATIVE mg/dL
Specific Gravity, Urine: 1.029 (ref 1.005–1.030)
pH: 5 (ref 5.0–8.0)

## 2021-01-19 MED ORDER — HEPARIN SOD (PORK) LOCK FLUSH 100 UNIT/ML IV SOLN
500.0000 [IU] | Freq: Once | INTRAVENOUS | Status: AC | PRN
  Administered 2021-01-19: 500 [IU]
  Filled 2021-01-19: qty 5

## 2021-01-19 MED ORDER — SODIUM CHLORIDE 0.9% FLUSH
10.0000 mL | INTRAVENOUS | Status: DC | PRN
  Administered 2021-01-19: 10 mL
  Filled 2021-01-19: qty 10

## 2021-01-19 MED ORDER — SODIUM CHLORIDE 0.9 % IV SOLN
65.0000 mg/m2 | Freq: Once | INTRAVENOUS | Status: AC
  Administered 2021-01-19: 150 mg via INTRAVENOUS
  Filled 2021-01-19: qty 25

## 2021-01-19 MED ORDER — DIPHENHYDRAMINE HCL 50 MG/ML IJ SOLN
INTRAMUSCULAR | Status: AC
  Filled 2021-01-19: qty 1

## 2021-01-19 MED ORDER — FAMOTIDINE 20 MG IN NS 100 ML IVPB
20.0000 mg | Freq: Once | INTRAVENOUS | Status: AC
  Administered 2021-01-19: 20 mg via INTRAVENOUS

## 2021-01-19 MED ORDER — DIPHENHYDRAMINE HCL 50 MG/ML IJ SOLN
25.0000 mg | Freq: Once | INTRAMUSCULAR | Status: AC
  Administered 2021-01-19: 25 mg via INTRAVENOUS

## 2021-01-19 MED ORDER — TRASTUZUMAB-DKST CHEMO 150 MG IV SOLR
2.0000 mg/kg | Freq: Once | INTRAVENOUS | Status: AC
  Administered 2021-01-19: 231 mg via INTRAVENOUS
  Filled 2021-01-19: qty 11

## 2021-01-19 MED ORDER — SODIUM CHLORIDE 0.9 % IV SOLN
10.0000 mg | Freq: Once | INTRAVENOUS | Status: AC
  Administered 2021-01-19: 10 mg via INTRAVENOUS
  Filled 2021-01-19: qty 10

## 2021-01-19 MED ORDER — SODIUM CHLORIDE 0.9 % IV SOLN
Freq: Once | INTRAVENOUS | Status: AC
Start: 2021-01-19 — End: 2021-01-19
  Filled 2021-01-19: qty 250

## 2021-01-19 MED ORDER — ACETAMINOPHEN 325 MG PO TABS
650.0000 mg | ORAL_TABLET | Freq: Once | ORAL | Status: AC
  Administered 2021-01-19: 650 mg via ORAL

## 2021-01-19 MED ORDER — FAMOTIDINE 20 MG IN NS 100 ML IVPB
INTRAVENOUS | Status: AC
  Filled 2021-01-19: qty 100

## 2021-01-19 MED ORDER — ACETAMINOPHEN 325 MG PO TABS
ORAL_TABLET | ORAL | Status: AC
  Filled 2021-01-19: qty 2

## 2021-01-19 NOTE — Telephone Encounter (Signed)
RN attempt x1 to contact pt regarding UA results being negative for bacteria per Wilber Bihari, NP.  No answer, LVM to return call to the office.

## 2021-01-19 NOTE — Progress Notes (Signed)
[facility] Follow up:    Grace Carol, MD [address] [place] [address]   DIAGNOSIS: Cancer Staging Malignant neoplasm of upper-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, estrogen receptor positive ([place]) Staging form: Breast, AJCC 8th Edition - Clinical stage from 09/08/2020: Stage IA (cT1b, cN0, cM0, G2, ER+, PR+, HER2+) - Signed by Gardenia Phlegm, NP on 09/20/2020 Stage prefix: Initial diagnosis - Pathologic stage from 10/16/2020: Stage IA (pT1b, pN0, cM0, G3, ER+, PR+, HER2+) - Signed by Gardenia Phlegm, NP on 11/01/2020 Histologic grading system: 3 grade system   SUMMARY OF ONCOLOGIC HISTORY: Oncology History  Malignant neoplasm of upper-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, estrogen receptor positive ([place])  09/08/2020 Cancer Staging   Staging form: Breast, AJCC 8th Edition - Clinical stage from 09/08/2020: Stage IA (cT1b, cN0, cM0, G2, ER+, PR+, HER2+) - Signed by Gardenia Phlegm, NP on 09/20/2020  Stage prefix: Initial diagnosis    09/08/2020 Initial Diagnosis   Mammogram detected 0.7 cm mass in the right breast 1 o'clock position, right breast biopsy 1: Grade 2 IDC ER 95% PR 95%, Ki-67 30%, HER-2 positive ratio 2.83   10/16/2020 Surgery   Right lumpectomy Grace Pennington): IDC, 1.0cm, grade 3, with high grade DCIS, clear margins, 4 right axillary lymph nodes negative for carcinoma.   10/16/2020 Cancer Staging   Staging form: Breast, AJCC 8th Edition - Pathologic stage from 10/16/2020: Stage IA (pT1b, pN0, cM0, G3, ER+, PR+, HER2+) - Signed by Gardenia Phlegm, NP on 11/01/2020  Histologic grading system: 3 grade system    11/13/2020 -  Chemotherapy    Patient is on Treatment Plan: BREAST PACLITAXEL + TRASTUZUMAB Q7D / TRASTUZUMAB Q21D         CURRENT THERAPY:  Taxol/Herceptin  INTERVAL HISTORY: Grace Pennington 50 y.o. female returns for evaluation prior to receiving cycle 10 of 12 Taxol/herceptin that  she is receiving weekly.  She says she is tolerating this well.  She has some mild fatigue.  She notes over the past couple of days she will urinate and have an odd bladder fullness/tightness afterward.  She denies hematuria, dysuria, and notes she has never had a UTI previously.  She denies peripheral neuropathy.    Grace Pennington is doing well otherwise.  She is remaining active with walking, and continues to work from home in [place] for [employer].     Patient Active Problem List   Diagnosis Date Noted   Port-A-Cath in place 11/20/2020   Malignant neoplasm of upper-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, estrogen receptor positive ([place]) 09/20/2020   Precordial pain 04/14/2014   Essential hypertension, benign 08/03/2013   Family history of ischemic heart disease 08/03/2013    has No Known Allergies.  MEDICAL HISTORY: Past Medical History:  Diagnosis Date   Anxiety    Breast cancer in female [facility])    Right   Depression    GERD (gastroesophageal reflux disease)    Hypertension    Palpitations    Sleep apnea     SURGICAL HISTORY: Past Surgical History:  Procedure Laterality Date   BREAST LUMPECTOMY WITH RADIOACTIVE SEED AND SENTINEL LYMPH NODE BIOPSY Right 10/16/2020   Procedure: RIGHT BREAST LUMPECTOMY WITH RADIOACTIVE SEED AND SENTINEL LYMPH NODE BIOPSY;  Surgeon: Grace Kussmaul, MD;  Location: [facility];  Service: General;  Laterality: Right;   PORTACATH PLACEMENT Left 10/16/2020   Procedure: INSERTION PORT-A-CATH;  Surgeon: Grace Kussmaul, MD;  Location: [facility];  Service: General;  Laterality: Left;   TUBAL LIGATION      SOCIAL HISTORY: Social History   Socioeconomic History   Marital status: Single    Spouse name: Not on file   Number of children: 2   Years of education: Not on file   Highest education level: Not on file  Occupational History   Occupation: medical records    Employer: [employer]  Tobacco Use   Smoking status: Former    Pack years: 0.00    Types:  Cigarettes    Quit date: 07/29/1996    Years since quitting: 24.4   Smokeless tobacco: Never  Vaping Use   Vaping Use: Never used  Substance and Sexual Activity   Alcohol use: Yes    Comment: socially   Drug use: No   Sexual activity: Yes    Birth control/protection: Surgical  Other Topics Concern   Not on file  Social History Narrative   Not on file   Social Determinants of Health   Financial Resource Strain: Not on file  Food Insecurity: Not on file  Transportation Needs: Not on file  Physical Activity: Not on file  Stress: Not on file  Social Connections: Not on file  Intimate Partner Violence: Not At Risk   Fear of Current or Ex-Partner: No   Emotionally Abused: No   Physically Abused: No   Sexually Abused: No    FAMILY HISTORY: Family History  Problem Relation Age of Onset   Heart disease Mother    Diabetes Mother    Hyperlipidemia Mother    Hypertension Mother    Kidney failure Mother    Diabetes Other    Stroke Other    Hypertension Other    Kidney failure Other    Coronary artery disease Other    Sleep apnea Father    Diabetes Father    Hyperlipidemia Father    Hypertension Father     Review of Systems  Constitutional:  Positive for fatigue. Negative for appetite change, chills, fever and unexpected weight change.  HENT:   Negative for hearing loss, lump/mass and trouble swallowing.   Eyes:  Negative for eye problems and icterus.  Respiratory:  Negative for chest tightness, cough and shortness of breath.   Cardiovascular:  Negative for chest pain, leg swelling and palpitations.  Gastrointestinal:  Negative for abdominal distention, abdominal pain, constipation, diarrhea, nausea and vomiting.  Endocrine: Negative for hot flashes.  Genitourinary:  Positive for frequency. Negative for difficulty urinating and vaginal discharge.   Musculoskeletal:  Negative for arthralgias.  Skin:  Negative for itching and rash.  Neurological:  Negative for dizziness,  extremity weakness, headaches and numbness.  Hematological:  Negative for adenopathy. Does not bruise/bleed easily.  Psychiatric/Behavioral:  Negative for depression. The patient is not nervous/anxious.      PHYSICAL EXAMINATION  ECOG PERFORMANCE STATUS: 1 - Symptomatic but completely ambulatory  Vitals:   01/19/21 0816  BP: 112/80  Pulse: 100  Resp: 18  Temp: 97.8 F (36.6 C)  SpO2: 98%    Physical Exam Constitutional:      General: She is not in acute distress.    Appearance: Normal appearance. She is not toxic-appearing.  HENT:     Head: Normocephalic and atraumatic.  Eyes:     General: No scleral icterus. Cardiovascular:     Rate and Rhythm: Normal rate and regular rhythm.     Pulses: Normal pulses.     Heart sounds: Normal heart sounds.  Pulmonary:  Effort: Pulmonary effort is normal.     Breath sounds: Normal breath sounds.  Abdominal:     General: Abdomen is flat. Bowel sounds are normal. There is no distension.     Palpations: Abdomen is soft.     Tenderness: There is no abdominal tenderness.  Musculoskeletal:        General: No swelling.     Cervical back: Neck supple.  Lymphadenopathy:     Cervical: No cervical adenopathy.  Skin:    General: Skin is warm and dry.     Findings: No rash.  Neurological:     General: No focal deficit present.     Mental Status: She is alert and oriented to person, place, and time.  Psychiatric:        Mood and Affect: Mood normal.        Behavior: Behavior normal.    LABORATORY DATA:  CBC    Component Value Date/Time   WBC 5.9 01/19/2021 0803   WBC 5.6 10/11/2020 1013   RBC 4.26 01/19/2021 0803   HGB 13.1 01/19/2021 0803   HGB 12.6 10/07/2017 1200   HCT 38.1 01/19/2021 0803   HCT 39.4 10/07/2017 1200   PLT 267 01/19/2021 0803   MCV 89.4 01/19/2021 0803   MCV 83 10/07/2017 1200   MCH 30.8 01/19/2021 0803   MCHC 34.4 01/19/2021 0803   RDW 16.5 (H) 01/19/2021 0803   RDW 16.6 (H) 10/07/2017 1200    LYMPHSABS 2.0 01/19/2021 0803   LYMPHSABS 2.8 10/07/2017 1200   MONOABS 0.6 01/19/2021 0803   EOSABS 0.1 01/19/2021 0803   EOSABS 0.2 10/07/2017 1200   BASOSABS 0.0 01/19/2021 0803   BASOSABS 0.0 10/07/2017 1200    CMP     Component Value Date/Time   NA 140 01/19/2021 0803   NA 143 08/13/2018 0749   K 4.2 01/19/2021 0803   CL 108 01/19/2021 0803   CO2 23 01/19/2021 0803   GLUCOSE 121 (H) 01/19/2021 0803   BUN 18 01/19/2021 0803   BUN 19 08/13/2018 0749   CREATININE 0.93 01/19/2021 0803   CALCIUM 9.4 01/19/2021 0803   PROT 7.3 01/19/2021 0803   PROT 7.3 08/13/2018 0749   ALBUMIN 3.7 01/19/2021 0803   ALBUMIN 4.2 08/13/2018 0749   AST 18 01/19/2021 0803   ALT 29 01/19/2021 0803   ALKPHOS 59 01/19/2021 0803   BILITOT 0.3 01/19/2021 0803   GFRNONAA >60 01/19/2021 0803   GFRAA 83 08/13/2018 0749        ASSESSMENT and THERAPY PLAN:   Malignant neoplasm of upper-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, estrogen receptor positive ([place]) 09/08/2020:Mammogram detected 0.7 cm mass in the right breast 1 o'clock position, right breast biopsy 1: Grade 2 IDC ER 95% PR 95%, Ki-67 30%, HER-2 positive ratio 2.83 T1b N0 stage Ia   10/16/20: Grade 3 IDC with DCIS 0/4 LN  ER 95% PR 95%, Ki-67 30%, HER-2 positive ratio 2.83   Treatment Plan: 1. adjuvant Taxol Herceptin followed by Herceptin maintenance for 1 year plan to start in 3 weeks 2.  Adjuvant radiation 3.  Followed by adjuvant antiestrogen therapy -------------------------------------------------------------------------------------------------------------------------- Current Treatment: Cycle 10 Taxol Herceptin Chemo Toxicities:  1. Fatigue: continuing to exercise which is beneficial 2. Urinary discomfort: will get urinalysis and urine culture today We reviewed her health maintenance today and there are a couple of things that are outstanding.  If she ever wishes to have our help with receiving Pneumovax, or HCV/HIV screening I  let her know  we are happy to help.  S Monitoring closely for peripheral neuropathy. RTC weekly for chemo and q 2 weeks for follow ups   Orders Placed This Encounter  Procedures   Culture, Urine    Standing Status:   Future    Number of Occurrences:   1    Standing Expiration Date:   01/19/2022   Urinalysis, Complete w Microscopic    Standing Status:   Future    Number of Occurrences:   1    Standing Expiration Date:   01/19/2022   ECHOCARDIOGRAM COMPLETE    Standing Status:   Future    Standing Expiration Date:   01/19/2022    Order Specific Question:   Where should this test be performed    Answer:   [place]    Order Specific Question:   Perflutren DEFINITY (image enhancing agent) should be administered unless hypersensitivity or allergy exist    Answer:   Administer Perflutren    Order Specific Question:   Reason for exam-Echo    Answer:   Chemo  Z09   She knows to call for any questions that may arise between now and her next appointment.  We are happy to see her sooner if needed.  Total encounter time: 20 minutes in chart review, lab review, face-to-face visit time, order entry, and documentation of the encounter.  Wilber Bihari, NP 01/19/21 9:56 AM Medical Oncology and Hematology [facility] [place], [place] [address] Tel. [phone]    Fax. [phone]  *Total Encounter Time as defined by the Centers for Medicare and Medicaid Services includes, in addition to the face-to-face time of a patient visit (documented in the note above) non-face-to-face time: obtaining and reviewing outside history, ordering and reviewing medications, tests or procedures, care coordination (communications with other health care professionals or caregivers) and documentation in the medical record.

## 2021-01-19 NOTE — Telephone Encounter (Signed)
Spoke with Disability Representative at [employer] regarding forms received for Patient. Current Claim  [phone]# [id number]) requires only medical records to be sent at this time. Request forwarded to HIM with signed release. Current claim is for short term disability and will be good through February 01 2021. The [employer] stated that Claim # [id number] has been cancelled at this time but will be reopened after short term disability is complete. Attending Provider Statement for this claim forwarded to Provider for signature and The [employer] Representative stated that this updated form would be sufficient to begin long term disability in July if needed. The [employer] representative was notified that a new Release of Information Form would need to be signed by Patient at this time as current form expired on 0610/2022. Notified Patient of call to the insurance company and that a new release would need to be signed for records needed after 01/05/2021.

## 2021-01-19 NOTE — Assessment & Plan Note (Addendum)
09/08/2020:Mammogram detected 0.7 cm mass in the right breast 1 o'clock position, right breast biopsy 1: Grade 2 IDC ER 95% PR 95%, Ki-67 30%, HER-2 positive ratio 2.83 T1b N0 stage Ia  10/16/20: Grade 3 IDC with DCIS 0/4 LNER 95% PR 95%, Ki-67 30%, HER-2 positive ratio 2.83  Treatment Plan: 1.adjuvant Taxol Herceptin followed by Herceptin maintenance for 1 yearplan to start in 3 weeks 2.Adjuvant radiation 3.Followed by adjuvant antiestrogen therapy -------------------------------------------------------------------------------------------------------------------------- Current Treatment: Cycle10Taxol Herceptin Chemo Toxicities: 1. Fatigue: continuing to exercise which is beneficial 2. Urinary discomfort: will get urinalysis and urine culture today We reviewed her health maintenance today and there are a couple of things that are outstanding.  If she ever wishes to have our help with receiving Pneumovax, or HCV/HIV screening I let her know we are happy to help.  S Monitoring closely for peripheral neuropathy. RTC weekly for chemo and q 2 weeks for follow ups

## 2021-01-19 NOTE — Telephone Encounter (Signed)
Scheduled appointment per 06/24 los. Patient will receive updated calender.

## 2021-01-19 NOTE — Patient Instructions (Signed)
[facility]   Discharge Instructions: Thank you for choosing [facility] to provide your oncology and hematology care.   If you have a lab appointment with the [facility], please go directly to the [facility] and check in at the registration area.   Wear comfortable clothing and clothing appropriate for easy access to any Portacath or PICC line.   We strive to give you quality time with your provider. You may need to reschedule your appointment if you arrive late (15 or more minutes).  Arriving late affects you and other patients whose appointments are after yours.  Also, if you miss three or more appointments without notifying the office, you may be dismissed from the clinic at the provider's discretion.      For prescription refill requests, have your pharmacy contact our office and allow 72 hours for refills to be completed.    Today you received the following chemotherapy and/or immunotherapy agents: Trastuzumab (Herceptin) and Paclitaxel (Taxol)      To help prevent nausea and vomiting after your treatment, we encourage you to take your nausea medication as directed.  BELOW ARE SYMPTOMS THAT SHOULD BE REPORTED IMMEDIATELY: *FEVER GREATER THAN 100.4 F (38 C) OR HIGHER *CHILLS OR SWEATING *NAUSEA AND VOMITING THAT IS NOT CONTROLLED WITH YOUR NAUSEA MEDICATION *UNUSUAL SHORTNESS OF BREATH *UNUSUAL BRUISING OR BLEEDING *URINARY PROBLEMS (pain or burning when urinating, or frequent urination) *BOWEL PROBLEMS (unusual diarrhea, constipation, pain near the anus) TENDERNESS IN MOUTH AND THROAT WITH OR WITHOUT PRESENCE OF ULCERS (sore throat, sores in mouth, or a toothache) UNUSUAL RASH, SWELLING OR PAIN  UNUSUAL VAGINAL DISCHARGE OR ITCHING   Items with * indicate a potential emergency and should be followed up as soon as possible or go to the Emergency Department if any problems should occur.  Please show the CHEMOTHERAPY ALERT CARD or  IMMUNOTHERAPY ALERT CARD at check-in to the Emergency Department and triage nurse.  Should you have questions after your visit or need to cancel or reschedule your appointment, please contact [facility]  Dept: [phone]  and follow the prompts.  Office hours are 8:00 a.m. to 4:30 p.m. Monday - Friday. Please note that voicemails left after 4:00 p.m. may not be returned until the following business day.  We are closed weekends and major holidays. You have access to a nurse at all times for urgent questions. Please call the main number to the clinic Dept: [phone] and follow the prompts.   For any non-urgent questions, you may also contact your provider using MyChart. We now offer e-Visits for anyone 88 and older to request care online for non-urgent symptoms. For details visit [email or web address].   Also download the MyChart app! Go to the app store, search "MyChart", open the app, select [facility], and log in with your MyChart username and password.  Due to Covid, a mask is required upon entering the hospital/clinic. If you do not have a mask, one will be given to you upon arrival. For doctor visits, patients may have 1 support person aged 73 or older with them. For treatment visits, patients cannot have anyone with them due to current Covid guidelines and our immunocompromised population.

## 2021-01-19 NOTE — Patient Instructions (Signed)
Implanted Port Home Guide An implanted port is a device that is placed under the skin. It is usually placed in the chest. The device can be used to give IV medicine, to take blood, or for dialysis. You may have an implanted port if: You need IV medicine that would be irritating to the small veins in your hands or arms. You need IV medicines, such as antibiotics, for a long period of time. You need IV nutrition for a long period of time. You need dialysis. When you have a port, your health care provider can choose to use the port instead of veins in your arms for these procedures. You may have fewer limitations when using a port than you would if you used other types of long-term IVs, and you will likely be able to return to normal activities afteryour incision heals. An implanted port has two main parts: Reservoir. The reservoir is the part where a needle is inserted to give medicines or draw blood. The reservoir is round. After it is placed, it appears as a small, raised area under your skin. Catheter. The catheter is a thin, flexible tube that connects the reservoir to a vein. Medicine that is inserted into the reservoir goes into the catheter and then into the vein. How is my port accessed? To access your port: A numbing cream may be placed on the skin over the port site. Your health care provider will put on a mask and sterile gloves. The skin over your port will be cleaned carefully with a germ-killing soap and allowed to dry. Your health care provider will gently pinch the port and insert a needle into it. Your health care provider will check for a blood return to make sure the port is in the vein and is not clogged. If your port needs to remain accessed to get medicine continuously (constant infusion), your health care provider will place a clear bandage (dressing) over the needle site. The dressing and needle will need to be changed every week, or as told by your health care provider. What  is flushing? Flushing helps keep the port from getting clogged. Follow instructions from your health care provider about how and when to flush the port. Ports are usually flushed with saline solution or a medicine called heparin. The need for flushing will depend on how the port is used: If the port is only used from time to time to give medicines or draw blood, the port may need to be flushed: Before and after medicines have been given. Before and after blood has been drawn. As part of routine maintenance. Flushing may be recommended every 4-6 weeks. If a constant infusion is running, the port may not need to be flushed. Throw away any syringes in a disposal container that is meant for sharp items (sharps container). You can buy a sharps container from a pharmacy, or you can make one by using an empty hard plastic bottle with a cover. How long will my port stay implanted? The port can stay in for as long as your health care provider thinks it is needed. When it is time for the port to come out, a surgery will be done to remove it. The surgery will be similar to the procedure that was done to putthe port in. Follow these instructions at home:  Flush your port as told by your health care provider. If you need an infusion over several days, follow instructions from your health care provider about how to take   care of your port site. Make sure you: Wash your hands with soap and water before you change your dressing. If soap and water are not available, use alcohol-based hand sanitizer. Change your dressing as told by your health care provider. Place any used dressings or infusion bags into a plastic bag. Throw that bag in the trash. Keep the dressing that covers the needle clean and dry. Do not get it wet. Do not use scissors or sharp objects near the tube. Keep the tube clamped, unless it is being used. Check your port site every day for signs of infection. Check for: Redness, swelling, or  pain. Fluid or blood. Pus or a bad smell. Protect the skin around the port site. Avoid wearing bra straps that rub or irritate the site. Protect the skin around your port from seat belts. Place a soft pad over your chest if needed. Bathe or shower as told by your health care provider. The site may get wet as long as you are not actively receiving an infusion. Return to your normal activities as told by your health care provider. Ask your health care provider what activities are safe for you. Carry a medical alert card or wear a medical alert bracelet at all times. This will let health care providers know that you have an implanted port in case of an emergency. Get help right away if: You have redness, swelling, or pain at the port site. You have fluid or blood coming from your port site. You have pus or a bad smell coming from the port site. You have a fever. Summary Implanted ports are usually placed in the chest for long-term IV access. Follow instructions from your health care provider about flushing the port and changing bandages (dressings). Take care of the area around your port by avoiding clothing that puts pressure on the area, and by watching for signs of infection. Protect the skin around your port from seat belts. Place a soft pad over your chest if needed. Get help right away if you have a fever or you have redness, swelling, pain, drainage, or a bad smell at the port site. This information is not intended to replace advice given to you by your health care provider. Make sure you discuss any questions you have with your healthcare provider. Document Revised: 11/29/2019 Document Reviewed: 11/29/2019 Elsevier Patient Education  2022 Elsevier Inc.  

## 2021-01-20 LAB — URINE CULTURE: Culture: <10000 — AB

## 2021-01-23 NOTE — Progress Notes (Signed)
New Breast Cancer Diagnosis: Right Breast  10/16/2020    SAFETY ISSUES: Prior radiation? No Pacemaker/ICD? No Possible current pregnancy? Postmenopausal Is the patient on methotrexate?   Current Complaints / other details:

## 2021-01-24 ENCOUNTER — Ambulatory Visit
Admission: RE | Admit: 2021-01-24 | Discharge: 2021-01-24 | Disposition: A | Discharge status: Home / Self Care | Payer: No Typology Code available for payment source | Source: Ambulatory Visit | Attending: Radiation Oncology | Admitting: Radiation Oncology

## 2021-01-24 ENCOUNTER — Encounter: Payer: Self-pay | Admitting: Radiation Oncology

## 2021-01-24 ENCOUNTER — Other Ambulatory Visit: Payer: Self-pay

## 2021-01-24 VITALS — Ht 66.0 in | Wt 258.0 lb

## 2021-01-24 DIAGNOSIS — Z170 Estrogen receptor positive status [ER+]: Secondary | ICD-10-CM

## 2021-01-24 DIAGNOSIS — C50211 Malignant neoplasm of upper-inner quadrant of right female breast: Secondary | ICD-10-CM

## 2021-01-24 NOTE — Progress Notes (Signed)
Radiation Oncology         [phone] ________________________________   Name: Grace Pennington        MRN: [id number]  Date of Service: 01/24/2021 DOB: 1971/03/13  [id number], Jori Moll, MD  Nicholas Lose, MD     REFERRING PHYSICIAN: Nicholas Lose, MD   DIAGNOSIS: The encounter diagnosis was Malignant neoplasm of upper-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, estrogen receptor positive ([place]).   HISTORY OF PRESENT ILLNESS: Grace Pennington is a 50 y.o. female with a right breast cancer. The patient was found to have an abnormality in the right breast on screening mammogram which prompted further diagnostic work-up.  Her ultrasound of the right breast showed a lesion in the 1 o'clock position measuring 7 mm, no evidence of adenopathy was identified.  She underwent a right breast biopsy on 09/08/2020 which showed grade 2 invasive ductal carcinoma that was triple positive with a Ki-67 of 30%.    Since her last visit she underwent a right lumpectomy with sentinel node biopsy on 10/16/2020 revealed a grade 3 invasive ductal carcinoma measuring 1 cm with associated high-grade DCIS, her resection margins were negative for carcinoma, 4 sampled lymph nodes were negative for metastatic disease.  She had a Port-A-Cath placed as well and proceeded with systemic chemotherapy and targeted anti-HER2 therapy beginning on 11/13/2020.  Her last cycle was on 01/19/2021.  She is contacted today to coordinate adjuvant radiotherapy while she continues anti-HER2 therapy.   PREVIOUS RADIATION THERAPY: No   PAST MEDICAL HISTORY:  Past Medical History:  Diagnosis Date   Anxiety    Breast cancer in female [facility])    Right   Depression    GERD (gastroesophageal reflux disease)    Hypertension    Palpitations    Sleep apnea        PAST SURGICAL HISTORY: Past Surgical History:  Procedure Laterality Date   BREAST LUMPECTOMY WITH RADIOACTIVE SEED AND SENTINEL LYMPH NODE BIOPSY Right 10/16/2020   Procedure: RIGHT  BREAST LUMPECTOMY WITH RADIOACTIVE SEED AND SENTINEL LYMPH NODE BIOPSY;  Surgeon: [name], MD;  Location: [facility];  Service: General;  Laterality: Right;   PORTACATH PLACEMENT Left 10/16/2020   Procedure: INSERTION PORT-A-CATH;  Surgeon: [name], MD;  Location: [facility] OR;  Service: General;  Laterality: Left;   TUBAL LIGATION       FAMILY HISTORY:  Family History  Problem Relation Age of Onset   Heart disease Mother    Diabetes Mother    Hyperlipidemia Mother    Hypertension Mother    Kidney failure Mother    Diabetes Other    Stroke Other    Hypertension Other    Kidney failure Other    Coronary artery disease Other    Sleep apnea Father    Diabetes Father    Hyperlipidemia Father    Hypertension Father      SOCIAL HISTORY:  reports that she quit smoking about 24 years ago. She has never used smokeless tobacco. She reports current alcohol use. She reports that she does not use drugs. The patient is single and lives in [place]. 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    ALLERGIES: Patient has no known allergies.   MEDICATIONS:  Current Outpatient Medications  Medication Sig Dispense Refill   citalopram (CELEXA) 10 MG tablet TAKE 1 TABLET BY MOUTH ONCE DAILY 90 tablet 3   fluticasone (FLONASE) 50 MCG/ACT nasal  spray Place 2 sprays into both nostrils daily as needed for allergies.     hydrochlorothiazide (HYDRODIURIL) 25 MG tablet TAKE 1 TABLET BY MOUTH ONCE DAILY 90 tablet 3   hydroxypropyl methylcellulose / hypromellose (ISOPTO TEARS / GONIOVISC) 2.5 % ophthalmic solution Place 1 drop into both eyes 3 (three) times daily as needed (dry/irritated eyes).     lidocaine-prilocaine (EMLA) cream APPLY TO AFFECTED AREA ONCE 30 g 3   loratadine (CLARITIN) 10 MG tablet Take 10 mg by mouth daily as needed for allergies.     metFORMIN (GLUCOPHAGE) 1000 MG tablet Take 1/2 tablet by mouth twice a day  wth food for 2 weeks then increase to 1 tablet twice a day 180 tablet 3   Multiple Vitamins-Minerals (MULTIVITAMIN WITH MINERALS) tablet Take 1 tablet by mouth daily.     ondansetron (ZOFRAN) 8 MG tablet TAKE 1 TABLET BY MOUTH 2 TIMES DAILY AS NEEDED FOR NAUSEA OR VOMITING 30 tablet 1   pantoprazole (PROTONIX) 40 MG tablet TAKE 1 TABLET BY MOUTH DAILY 90 tablet 1   prochlorperazine (COMPAZINE) 10 MG tablet TAKE 1 TABLET BY MOUTH EVERY 6 HOURS AS NEEDED FOR NAUSEA OR VOMITING 30 tablet 1   rosuvastatin (CRESTOR) 10 MG tablet TAKE 1 TABLET BY MOUTH ONCE A DAY 90 tablet 3   spironolactone (ALDACTONE) 50 MG tablet TAKE 1 TABLET BY MOUTH 2 TIMES DAILY 60 tablet 12   No current facility-administered medications for this encounter.     REVIEW OF SYSTEMS: On review of systems, the patient reports that she is doing well overall. She feels that she did really well with chemotherapy. She did loose her hair but is hopeful to see it grow back soon. No other specific breast complaints are verbalized.     PHYSICAL EXAM:  Wt Readings from Last 3 Encounters:  01/19/21 258 lb (117 kg)  01/12/21 259 lb (117.5 kg)  01/05/21 239 lb 12.8 oz (108.8 kg)   Unable to assess due to encounter type.   ECOG = 0  0 - Asymptomatic (Fully active, able to carry on all predisease activities without restriction)  1 - Symptomatic but completely ambulatory (Restricted in physically strenuous activity but ambulatory and able to carry out work of a light or sedentary nature. For example, light housework, office work)  2 - Symptomatic, <50% in bed during the day (Ambulatory and capable of all self care but unable to carry out any work activities. Up and about more than 50% of waking hours)  3 - Symptomatic, >50% in bed, but not bedbound (Capable of only limited self-care, confined to bed or chair 50% or more of waking hours)  4 - Bedbound (Completely disabled. Cannot carry on any self-care. Totally confined to bed or  chair)  5 - Death   Eustace Pen MM, Creech RH, Tormey DC, et al. [phone]). "Toxicity and response criteria of the [facility] Group". [place] Oncol. 5 (6): 649-55    LABORATORY DATA:  Lab Results  Component Value Date   WBC 5.9 01/19/2021   HGB 13.1 01/19/2021   HCT 38.1 01/19/2021   MCV 89.4 01/19/2021   PLT 267 01/19/2021   Lab Results  Component Value Date   NA 140 01/19/2021   K 4.2 01/19/2021   CL 108 01/19/2021   CO2 23 01/19/2021   Lab Results  Component Value Date   ALT 29 01/19/2021   AST 18 01/19/2021   ALKPHOS 59 01/19/2021   BILITOT 0.3 01/19/2021  RADIOGRAPHY: No results found.      IMPRESSION/PLAN: 1. Stage IA, pT1bN0M0 grade 3 invasive carcinoma of the right breast. Dr. Lisbeth Renshaw has reviewed her final pathology findings and her course since. She has completed chemotherapy and in the coming weeks would be ready to pursue external radiotherapy to the breast  to reduce risks of local recurrence followed by antiestrogen therapy. She will continue [place] targeted antiHER2 therapy until next spring. She is aware this will continue with therapy. We discussed the risks, benefits, short, and long term effects of radiotherapy, as well as the curative intent, and the patient is interested in proceeding. Dr. Lisbeth Renshaw recommends 6 1/2 weeks of radiotherapy to the right breast. She will come in for simulation on 02/08/21 at which time she will sign written consent to proceed. 2. Contraceptive Counseling. She has had tubal ligation and does not need pregnancy testing prior to proceeding with treatment.    This encounter was provided by telemedicine platform MyChart but converted to telephone due to connectivity.  The patient has provided two factor identification and has given verbal consent for this type of encounter and has been advised to only accept a meeting of this type in a secure network environment. The time spent during this encounter was 45 minutes  including preparation, discussion, and coordination of the patient's care. The attendants for this meeting include Blenda Nicely, RN,  Hayden Pedro  and Reino Kent.  During the encounter,  Blenda Nicely, RN,   and Hayden Pedro were located at [facility] Radiation Oncology Department.  KYRRA PRADA was located at home.      Carola Rhine, [facility]    **Disclaimer: This note was dictated with voice recognition software. Similar sounding words can inadvertently be transcribed and this note may contain transcription errors which may not have been corrected upon publication of note.**

## 2021-01-26 ENCOUNTER — Inpatient Hospital Stay: Payer: No Typology Code available for payment source

## 2021-01-26 ENCOUNTER — Other Ambulatory Visit: Payer: Self-pay

## 2021-01-26 VITALS — BP 105/88 | HR 80 | Temp 98.1°F | Resp 18 | Wt 257.8 lb

## 2021-01-26 DIAGNOSIS — Z95828 Presence of other vascular implants and grafts: Secondary | ICD-10-CM

## 2021-01-26 DIAGNOSIS — C50211 Malignant neoplasm of upper-inner quadrant of right female breast: Secondary | ICD-10-CM | POA: Insufficient documentation

## 2021-01-26 DIAGNOSIS — G4730 Sleep apnea, unspecified: Secondary | ICD-10-CM | POA: Insufficient documentation

## 2021-01-26 DIAGNOSIS — Z5112 Encounter for antineoplastic immunotherapy: Secondary | ICD-10-CM | POA: Insufficient documentation

## 2021-01-26 DIAGNOSIS — Z5111 Encounter for antineoplastic chemotherapy: Secondary | ICD-10-CM | POA: Insufficient documentation

## 2021-01-26 DIAGNOSIS — Z79899 Other long term (current) drug therapy: Secondary | ICD-10-CM | POA: Insufficient documentation

## 2021-01-26 DIAGNOSIS — I10 Essential (primary) hypertension: Secondary | ICD-10-CM | POA: Insufficient documentation

## 2021-01-26 DIAGNOSIS — Z170 Estrogen receptor positive status [ER+]: Secondary | ICD-10-CM

## 2021-01-26 DIAGNOSIS — Z510 Encounter for antineoplastic radiation therapy: Secondary | ICD-10-CM | POA: Diagnosis present

## 2021-01-26 LAB — CBC WITH DIFFERENTIAL (CANCER CENTER ONLY)
Abs Immature Granulocytes: 0.02 10*3/uL (ref 0.00–0.07)
Basophils Absolute: 0 10*3/uL (ref 0.0–0.1)
Basophils Relative: 0 %
Eosinophils Absolute: 0.1 10*3/uL (ref 0.0–0.5)
Eosinophils Relative: 1 %
HCT: 38.9 % (ref 36.0–46.0)
Hemoglobin: 13.1 g/dL (ref 12.0–15.0)
Immature Granulocytes: 0 %
Lymphocytes Relative: 40 %
Lymphs Abs: 2 10*3/uL (ref 0.7–4.0)
MCH: 30.4 pg (ref 26.0–34.0)
MCHC: 33.7 g/dL (ref 30.0–36.0)
MCV: 90.3 fL (ref 80.0–100.0)
Monocytes Absolute: 0.5 10*3/uL (ref 0.1–1.0)
Monocytes Relative: 10 %
Neutro Abs: 2.4 10*3/uL (ref 1.7–7.7)
Neutrophils Relative %: 49 %
Platelet Count: 291 10*3/uL (ref 150–400)
RBC: 4.31 MIL/uL (ref 3.87–5.11)
RDW: 16.8 % — ABNORMAL HIGH (ref 11.5–15.5)
WBC Count: 4.9 10*3/uL (ref 4.0–10.5)
nRBC: 0 % (ref 0.0–0.2)

## 2021-01-26 LAB — CMP (CANCER CENTER ONLY)
ALT: 26 U/L (ref 0–44)
AST: 19 U/L (ref 15–41)
Albumin: 3.9 g/dL (ref 3.5–5.0)
Alkaline Phosphatase: 48 U/L (ref 38–126)
Anion gap: 8 (ref 5–15)
BUN: 17 mg/dL (ref 6–20)
CO2: 26 mmol/L (ref 22–32)
Calcium: 9.4 mg/dL (ref 8.9–10.3)
Chloride: 104 mmol/L (ref 98–111)
Creatinine: 1.05 mg/dL — ABNORMAL HIGH (ref 0.44–1.00)
GFR, Estimated: >60 mL/min (ref >60)
Glucose, Bld: 113 mg/dL — ABNORMAL HIGH (ref 70–99)
Potassium: 3.6 mmol/L (ref 3.5–5.1)
Sodium: 138 mmol/L (ref 135–145)
Total Bilirubin: 0.3 mg/dL (ref 0.3–1.2)
Total Protein: 7.1 g/dL (ref 6.5–8.1)

## 2021-01-26 MED ORDER — DIPHENHYDRAMINE HCL 50 MG/ML IJ SOLN
25.0000 mg | Freq: Once | INTRAMUSCULAR | Status: AC
  Administered 2021-01-26: 25 mg via INTRAVENOUS

## 2021-01-26 MED ORDER — DIPHENHYDRAMINE HCL 50 MG/ML IJ SOLN
INTRAMUSCULAR | Status: AC
  Filled 2021-01-26: qty 1

## 2021-01-26 MED ORDER — ACETAMINOPHEN 325 MG PO TABS
ORAL_TABLET | ORAL | Status: AC
  Filled 2021-01-26: qty 2

## 2021-01-26 MED ORDER — SODIUM CHLORIDE 0.9% FLUSH
10.0000 mL | INTRAVENOUS | Status: DC | PRN
  Administered 2021-01-26: 10 mL
  Filled 2021-01-26: qty 10

## 2021-01-26 MED ORDER — HEPARIN SOD (PORK) LOCK FLUSH 100 UNIT/ML IV SOLN
500.0000 [IU] | Freq: Once | INTRAVENOUS | Status: AC | PRN
Start: 2021-01-26 — End: 2021-01-26
  Administered 2021-01-26: 500 [IU]
  Filled 2021-01-26: qty 5

## 2021-01-26 MED ORDER — SODIUM CHLORIDE 0.9% FLUSH
10.0000 mL | Freq: Once | INTRAVENOUS | Status: AC
Start: 2021-01-26 — End: 2021-01-26
  Administered 2021-01-26: 10 mL
  Filled 2021-01-26: qty 10

## 2021-01-26 MED ORDER — FAMOTIDINE 20 MG IN NS 100 ML IVPB
20.0000 mg | Freq: Once | INTRAVENOUS | Status: AC
  Administered 2021-01-26: 20 mg via INTRAVENOUS

## 2021-01-26 MED ORDER — ACETAMINOPHEN 325 MG PO TABS
650.0000 mg | ORAL_TABLET | Freq: Once | ORAL | Status: AC
  Administered 2021-01-26: 650 mg via ORAL

## 2021-01-26 MED ORDER — SODIUM CHLORIDE 0.9 % IV SOLN
10.0000 mg | Freq: Once | INTRAVENOUS | Status: AC
  Administered 2021-01-26: 10 mg via INTRAVENOUS
  Filled 2021-01-26: qty 10

## 2021-01-26 MED ORDER — SODIUM CHLORIDE 0.9 % IV SOLN
Freq: Once | INTRAVENOUS | Status: AC
  Filled 2021-01-26: qty 250

## 2021-01-26 MED ORDER — SODIUM CHLORIDE 0.9 % IV SOLN
65.0000 mg/m2 | Freq: Once | INTRAVENOUS | Status: AC
  Administered 2021-01-26: 150 mg via INTRAVENOUS
  Filled 2021-01-26: qty 25

## 2021-01-26 MED ORDER — TRASTUZUMAB-DKST CHEMO 150 MG IV SOLR
2.0000 mg/kg | Freq: Once | INTRAVENOUS | Status: AC
  Administered 2021-01-26: 231 mg via INTRAVENOUS
  Filled 2021-01-26: qty 11

## 2021-01-26 NOTE — Patient Instructions (Signed)
[facility]   Discharge Instructions: Thank you for choosing [facility] to provide your oncology and hematology care.   If you have a lab appointment with the [facility], please go directly to the [facility] and check in at the registration area.   Wear comfortable clothing and clothing appropriate for easy access to any Portacath or PICC line.   We strive to give you quality time with your provider. You may need to reschedule your appointment if you arrive late (15 or more minutes).  Arriving late affects you and other patients whose appointments are after yours.  Also, if you miss three or more appointments without notifying the office, you may be dismissed from the clinic at the provider's discretion.      For prescription refill requests, have your pharmacy contact our office and allow 72 hours for refills to be completed.    Today you received the following chemotherapy and/or immunotherapy agents: Trastuzumab (Herceptin) and Paclitaxel (Taxol)      To help prevent nausea and vomiting after your treatment, we encourage you to take your nausea medication as directed.  BELOW ARE SYMPTOMS THAT SHOULD BE REPORTED IMMEDIATELY: *FEVER GREATER THAN 100.4 F (38 C) OR HIGHER *CHILLS OR SWEATING *NAUSEA AND VOMITING THAT IS NOT CONTROLLED WITH YOUR NAUSEA MEDICATION *UNUSUAL SHORTNESS OF BREATH *UNUSUAL BRUISING OR BLEEDING *URINARY PROBLEMS (pain or burning when urinating, or frequent urination) *BOWEL PROBLEMS (unusual diarrhea, constipation, pain near the anus) TENDERNESS IN MOUTH AND THROAT WITH OR WITHOUT PRESENCE OF ULCERS (sore throat, sores in mouth, or a toothache) UNUSUAL RASH, SWELLING OR PAIN  UNUSUAL VAGINAL DISCHARGE OR ITCHING   Items with * indicate a potential emergency and should be followed up as soon as possible or go to the Emergency Department if any problems should occur.  Please show the CHEMOTHERAPY ALERT CARD or  IMMUNOTHERAPY ALERT CARD at check-in to the Emergency Department and triage nurse.  Should you have questions after your visit or need to cancel or reschedule your appointment, please contact [facility]  Dept: [phone]  and follow the prompts.  Office hours are 8:00 a.m. to 4:30 p.m. Monday - Friday. Please note that voicemails left after 4:00 p.m. may not be returned until the following business day.  We are closed weekends and major holidays. You have access to a nurse at all times for urgent questions. Please call the main number to the clinic Dept: [phone] and follow the prompts.   For any non-urgent questions, you may also contact your provider using MyChart. We now offer e-Visits for anyone 88 and older to request care online for non-urgent symptoms. For details visit [email or web address].   Also download the MyChart app! Go to the app store, search "MyChart", open the app, select [facility], and log in with your MyChart username and password.  Due to Covid, a mask is required upon entering the hospital/clinic. If you do not have a mask, one will be given to you upon arrival. For doctor visits, patients may have 1 support person aged 73 or older with them. For treatment visits, patients cannot have anyone with them due to current Covid guidelines and our immunocompromised population.

## 2021-01-30 ENCOUNTER — Ambulatory Visit: Payer: No Typology Code available for payment source | Admitting: Radiation Oncology

## 2021-02-01 NOTE — Progress Notes (Signed)
Patient Care Team: Seward Carol, MD as PCP - General (Internal Medicine) Nicholas Lose, MD as Consulting Physician (Hematology and Oncology) [name], MD as Consulting Physician (General Surgery) Kyung Rudd, MD as Consulting Physician (Radiation Oncology) Armandina Stammer, DO as Consulting Physician (Obstetrics and Gynecology)  DIAGNOSIS:    ICD-10-CM   1. Malignant neoplasm of upper-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, estrogen receptor positive ([place])  C50.211    Z17.0       SUMMARY OF ONCOLOGIC HISTORY: Oncology History  Malignant neoplasm of upper-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, estrogen receptor positive ([place])  09/08/2020 Cancer Staging   Staging form: Breast, AJCC 8th Edition - Clinical stage from 09/08/2020: Stage IA (cT1b, cN0, cM0, G2, ER+, PR+, HER2+) - Signed by Gardenia Phlegm, NP on 09/20/2020  Stage prefix: Initial diagnosis    09/08/2020 Initial Diagnosis   Mammogram detected 0.7 cm mass in the right breast 1 o'clock position, right breast biopsy 1: Grade 2 IDC ER 95% PR 95%, Ki-67 30%, HER-2 positive ratio 2.83   10/16/2020 Surgery   Right lumpectomy [name]): IDC, 1.0cm, grade 3, with high grade DCIS, clear margins, 4 right axillary lymph nodes negative for carcinoma.   10/16/2020 Cancer Staging   Staging form: Breast, AJCC 8th Edition - Pathologic stage from 10/16/2020: Stage IA (pT1b, pN0, cM0, G3, ER+, PR+, HER2+) - Signed by Gardenia Phlegm, NP on 11/01/2020  Histologic grading system: 3 grade system    11/13/2020 -  Chemotherapy    Patient is on Treatment Plan: BREAST PACLITAXEL + TRASTUZUMAB Q7D / TRASTUZUMAB Q21D         CHIEF COMPLIANT: Cycle 12 Taxol Herceptin  INTERVAL HISTORY: Grace Pennington is a 50 y.o. with above-mentioned history of right breast cancer who underwent a right lumpectomy and is currently on adjuvant chemotherapy with Taxol Herceptin. She presents to the clinic today for treatment.  Today's  last cycle of her chemotherapy.  Continues to have mild to moderate fatigue.  Does not have any nausea or vomiting denies neuropathy.  ALLERGIES:  has No Known Allergies.  MEDICATIONS:  Current Outpatient Medications  Medication Sig Dispense Refill   citalopram (CELEXA) 10 MG tablet TAKE 1 TABLET BY MOUTH ONCE DAILY 90 tablet 3   fluticasone (FLONASE) 50 MCG/ACT nasal spray Place 2 sprays into both nostrils daily as needed for allergies.     hydrochlorothiazide (HYDRODIURIL) 25 MG tablet TAKE 1 TABLET BY MOUTH ONCE DAILY 90 tablet 3   hydroxypropyl methylcellulose / hypromellose (ISOPTO TEARS / GONIOVISC) 2.5 % ophthalmic solution Place 1 drop into both eyes 3 (three) times daily as needed (dry/irritated eyes).     lidocaine-prilocaine (EMLA) cream APPLY TO AFFECTED AREA ONCE 30 g 3   loratadine (CLARITIN) 10 MG tablet Take 10 mg by mouth daily as needed for allergies.     metFORMIN (GLUCOPHAGE) 1000 MG tablet Take 1/2 tablet by mouth twice a day wth food for 2 weeks then increase to 1 tablet twice a day 180 tablet 3   Multiple Vitamins-Minerals (MULTIVITAMIN WITH MINERALS) tablet Take 1 tablet by mouth daily.     ondansetron (ZOFRAN) 8 MG tablet TAKE 1 TABLET BY MOUTH 2 TIMES DAILY AS NEEDED FOR NAUSEA OR VOMITING 30 tablet 1   pantoprazole (PROTONIX) 40 MG tablet TAKE 1 TABLET BY MOUTH DAILY 90 tablet 1   prochlorperazine (COMPAZINE) 10 MG tablet TAKE 1 TABLET BY MOUTH EVERY 6 HOURS AS NEEDED FOR NAUSEA OR VOMITING 30 tablet 1  rosuvastatin (CRESTOR) 10 MG tablet TAKE 1 TABLET BY MOUTH ONCE A DAY 90 tablet 3   spironolactone (ALDACTONE) 50 MG tablet TAKE 1 TABLET BY MOUTH 2 TIMES DAILY 60 tablet 12   No current facility-administered medications for this visit.    PHYSICAL EXAMINATION: ECOG PERFORMANCE STATUS: 1 - Symptomatic but completely ambulatory  Vitals:   02/02/21 0801  BP: 120/83  Pulse: 88  Resp: 18  Temp: 97.8 F (36.6 C)  SpO2: 99%   Filed Weights   02/02/21 0801   Weight: 257 lb (116.6 kg)    LABORATORY DATA:  I have reviewed the data as listed CMP Latest Ref Rng & Units 01/26/2021 01/19/2021 01/12/2021  Glucose 70 - 99 mg/dL 113(H) 121(H) 112(H)  BUN 6 - 20 mg/dL 17 18 13   Creatinine 0.44 - 1.00 mg/dL 1.05(H) 0.93 1.05(H)  Sodium 135 - 145 mmol/L 138 140 138  Potassium 3.5 - 5.1 mmol/L 3.6 4.2 3.6  Chloride 98 - 111 mmol/L 104 108 107  CO2 22 - 32 mmol/L 26 23 25   Calcium 8.9 - 10.3 mg/dL 9.4 9.4 8.5(L)  Total Protein 6.5 - 8.1 g/dL 7.1 7.3 6.7  Total Bilirubin 0.3 - 1.2 mg/dL 0.3 0.3 0.4  Alkaline Phos 38 - 126 U/L 48 59 45  AST 15 - 41 U/L 19 18 21   ALT 0 - 44 U/L 26 29 28     Lab Results  Component Value Date   WBC 4.9 01/26/2021   HGB 13.1 01/26/2021   HCT 38.9 01/26/2021   MCV 90.3 01/26/2021   PLT 291 01/26/2021   NEUTROABS 2.4 01/26/2021    ASSESSMENT & PLAN:  Malignant neoplasm of upper-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, estrogen receptor positive ([facility]) 09/08/2020:Mammogram detected 0.7 cm mass in the right breast 1 o'clock position, right breast biopsy 1: Grade 2 IDC ER 95% PR 95%, Ki-67 30%, HER-2 positive ratio 2.83 T1b N0 stage Ia   10/16/20: Grade 3 IDC with DCIS 0/4 LN  ER 95% PR 95%, Ki-67 30%, HER-2 positive ratio 2.83   Treatment Plan: 1. adjuvant Taxol Herceptin followed by Herceptin maintenance for 1 year plan to start in 3 weeks 2.  Adjuvant radiation 3.  Followed by adjuvant antiestrogen therapy -------------------------------------------------------------------------------------------------------------------------- Current Treatment: Cycle 12 Taxol Herceptin Chemo Toxicities:  1. Fatigue: Mild to moderate 2. Urinary discomfort: Resolved No neuropathy This concludes chemotherapy. She will then proceed for adjuvant radiation.  Return to clinic every 3 weeks for Herceptin every 6 weeks to follow-up with [place]    No orders of the defined types were placed in this encounter.  The patient has a good  understanding of the overall plan. she agrees with it. she will call with any problems that may develop before the next visit here.  Total time spent: 30 mins including face to face time and time spent for planning, charting and coordination of care  Rulon Eisenmenger, MD, MPH 02/02/2021  I, Thana Ates, am acting as scribe for Dr. Nicholas Lose.  I have reviewed the above documentation for accuracy and completeness, and I agree with the above.

## 2021-02-01 NOTE — Assessment & Plan Note (Signed)
09/08/2020:Mammogram detected 0.7 cm mass in the right breast 1 o'clock position, right breast biopsy 1: Grade 2 IDC ER 95% PR 95%, Ki-67 30%, HER-2 positive ratio 2.83 T1b N0 stage Ia  10/16/20: Grade 3 IDC with DCIS 0/4 LNER 95% PR 95%, Ki-67 30%, HER-2 positive ratio 2.83  Treatment Plan: 1.adjuvant Taxol Herceptin followed by Herceptin maintenance for 1 yearplan to start in 3 weeks 2.Adjuvant radiation 3.Followed by adjuvant antiestrogen therapy -------------------------------------------------------------------------------------------------------------------------- Current Treatment: Cycle12Taxol Herceptin Chemo Toxicities: 1. Fatigue: continuing to exercise which is beneficial 2. Urinary discomfort: will get urinalysis and urine culture today Monitoring closely for peripheral neuropathy. This concludes chemotherapy. She will then proceed for adjuvant radiation.  Return to clinic after radiation is complete.

## 2021-02-02 ENCOUNTER — Inpatient Hospital Stay: Payer: No Typology Code available for payment source

## 2021-02-02 ENCOUNTER — Encounter: Payer: Self-pay | Admitting: *Deleted

## 2021-02-02 ENCOUNTER — Other Ambulatory Visit: Payer: Self-pay

## 2021-02-02 ENCOUNTER — Inpatient Hospital Stay (HOSPITAL_BASED_OUTPATIENT_CLINIC_OR_DEPARTMENT_OTHER): Payer: No Typology Code available for payment source | Admitting: Hematology and Oncology

## 2021-02-02 DIAGNOSIS — C50211 Malignant neoplasm of upper-inner quadrant of right female breast: Secondary | ICD-10-CM

## 2021-02-02 DIAGNOSIS — Z170 Estrogen receptor positive status [ER+]: Secondary | ICD-10-CM

## 2021-02-02 DIAGNOSIS — Z510 Encounter for antineoplastic radiation therapy: Secondary | ICD-10-CM | POA: Diagnosis not present

## 2021-02-02 DIAGNOSIS — Z95828 Presence of other vascular implants and grafts: Secondary | ICD-10-CM

## 2021-02-02 LAB — CBC WITH DIFFERENTIAL (CANCER CENTER ONLY)
Abs Immature Granulocytes: 0.03 10*3/uL (ref 0.00–0.07)
Basophils Absolute: 0 10*3/uL (ref 0.0–0.1)
Basophils Relative: 0 %
Eosinophils Absolute: 0.1 10*3/uL (ref 0.0–0.5)
Eosinophils Relative: 1 %
HCT: 39.7 % (ref 36.0–46.0)
Hemoglobin: 13.4 g/dL (ref 12.0–15.0)
Immature Granulocytes: 1 %
Lymphocytes Relative: 27 %
Lymphs Abs: 1.6 10*3/uL (ref 0.7–4.0)
MCH: 30 pg (ref 26.0–34.0)
MCHC: 33.8 g/dL (ref 30.0–36.0)
MCV: 89 fL (ref 80.0–100.0)
Monocytes Absolute: 0.5 10*3/uL (ref 0.1–1.0)
Monocytes Relative: 9 %
Neutro Abs: 3.7 10*3/uL (ref 1.7–7.7)
Neutrophils Relative %: 62 %
Platelet Count: 318 10*3/uL (ref 150–400)
RBC: 4.46 MIL/uL (ref 3.87–5.11)
RDW: 16.4 % — ABNORMAL HIGH (ref 11.5–15.5)
WBC Count: 5.9 10*3/uL (ref 4.0–10.5)
nRBC: 0 % (ref 0.0–0.2)

## 2021-02-02 LAB — CMP (CANCER CENTER ONLY)
ALT: 26 U/L (ref 0–44)
AST: 17 U/L (ref 15–41)
Albumin: 3.9 g/dL (ref 3.5–5.0)
Alkaline Phosphatase: 56 U/L (ref 38–126)
Anion gap: 9 (ref 5–15)
BUN: 18 mg/dL (ref 6–20)
CO2: 27 mmol/L (ref 22–32)
Calcium: 9.9 mg/dL (ref 8.9–10.3)
Chloride: 103 mmol/L (ref 98–111)
Creatinine: 1.01 mg/dL — ABNORMAL HIGH (ref 0.44–1.00)
GFR, Estimated: >60 mL/min (ref >60)
Glucose, Bld: 118 mg/dL — ABNORMAL HIGH (ref 70–99)
Potassium: 3.5 mmol/L (ref 3.5–5.1)
Sodium: 139 mmol/L (ref 135–145)
Total Bilirubin: 0.4 mg/dL (ref 0.3–1.2)
Total Protein: 7.4 g/dL (ref 6.5–8.1)

## 2021-02-02 MED ORDER — SODIUM CHLORIDE 0.9 % IV SOLN
65.0000 mg/m2 | Freq: Once | INTRAVENOUS | Status: AC
  Administered 2021-02-02: 150 mg via INTRAVENOUS
  Filled 2021-02-02: qty 25

## 2021-02-02 MED ORDER — HEPARIN SOD (PORK) LOCK FLUSH 100 UNIT/ML IV SOLN
500.0000 [IU] | Freq: Once | INTRAVENOUS | Status: AC | PRN
  Administered 2021-02-02: 500 [IU]
  Filled 2021-02-02: qty 5

## 2021-02-02 MED ORDER — TRASTUZUMAB-DKST CHEMO 150 MG IV SOLR
6.0000 mg/kg | Freq: Once | INTRAVENOUS | Status: AC
  Administered 2021-02-02: 693 mg via INTRAVENOUS
  Filled 2021-02-02: qty 33

## 2021-02-02 MED ORDER — DEXAMETHASONE SODIUM PHOSPHATE 100 MG/10ML IJ SOLN
10.0000 mg | Freq: Once | INTRAMUSCULAR | Status: AC
  Administered 2021-02-02: 10 mg via INTRAVENOUS
  Filled 2021-02-02: qty 10

## 2021-02-02 MED ORDER — FAMOTIDINE 20 MG IN NS 100 ML IVPB
20.0000 mg | Freq: Once | INTRAVENOUS | Status: AC
  Administered 2021-02-02: 20 mg via INTRAVENOUS

## 2021-02-02 MED ORDER — DIPHENHYDRAMINE HCL 50 MG/ML IJ SOLN
INTRAMUSCULAR | Status: AC
  Filled 2021-02-02: qty 1

## 2021-02-02 MED ORDER — SODIUM CHLORIDE 0.9 % IV SOLN
Freq: Once | INTRAVENOUS | Status: AC
  Filled 2021-02-02: qty 250

## 2021-02-02 MED ORDER — SODIUM CHLORIDE 0.9% FLUSH
10.0000 mL | Freq: Once | INTRAVENOUS | Status: AC
Start: 2021-02-02 — End: 2021-02-02
  Administered 2021-02-02: 10 mL
  Filled 2021-02-02: qty 10

## 2021-02-02 MED ORDER — FAMOTIDINE 20 MG IN NS 100 ML IVPB
INTRAVENOUS | Status: AC
  Filled 2021-02-02: qty 100

## 2021-02-02 MED ORDER — DIPHENHYDRAMINE HCL 50 MG/ML IJ SOLN
25.0000 mg | Freq: Once | INTRAMUSCULAR | Status: AC
Start: 2021-02-02 — End: 2021-02-02
  Administered 2021-02-02: 25 mg via INTRAVENOUS

## 2021-02-02 MED ORDER — ACETAMINOPHEN 325 MG PO TABS
650.0000 mg | ORAL_TABLET | Freq: Once | ORAL | Status: AC
Start: 2021-02-02 — End: 2021-02-02
  Administered 2021-02-02: 650 mg via ORAL

## 2021-02-02 MED ORDER — SODIUM CHLORIDE 0.9% FLUSH
10.0000 mL | INTRAVENOUS | Status: DC | PRN
  Administered 2021-02-02: 10 mL
  Filled 2021-02-02: qty 10

## 2021-02-02 MED ORDER — ACETAMINOPHEN 325 MG PO TABS
ORAL_TABLET | ORAL | Status: AC
  Filled 2021-02-02: qty 2

## 2021-02-02 NOTE — Progress Notes (Signed)
Increase trastuzumab dose to 6mg /kg today

## 2021-02-02 NOTE — Patient Instructions (Signed)
[facility]  Discharge Instructions: Thank you for choosing [facility] to provide your oncology and hematology care.   If you have a lab appointment with the [facility], please go directly to the [facility] and check in at the registration area.   Wear comfortable clothing and clothing appropriate for easy access to any Portacath or PICC line.   We strive to give you quality time with your provider. You may need to reschedule your appointment if you arrive late (15 or more minutes).  Arriving late affects you and other patients whose appointments are after yours.  Also, if you miss three or more appointments without notifying the office, you may be dismissed from the clinic at the provider's discretion.      For prescription refill requests, have your pharmacy contact our office and allow 72 hours for refills to be completed.    Today you received the following chemotherapy and/or immunotherapy agents :Taxol, Herceptin     To help prevent nausea and vomiting after your treatment, we encourage you to take your nausea medication as directed.  BELOW ARE SYMPTOMS THAT SHOULD BE REPORTED IMMEDIATELY: *FEVER GREATER THAN 100.4 F (38 C) OR HIGHER *CHILLS OR SWEATING *NAUSEA AND VOMITING THAT IS NOT CONTROLLED WITH YOUR NAUSEA MEDICATION *UNUSUAL SHORTNESS OF BREATH *UNUSUAL BRUISING OR BLEEDING *URINARY PROBLEMS (pain or burning when urinating, or frequent urination) *BOWEL PROBLEMS (unusual diarrhea, constipation, pain near the anus) TENDERNESS IN MOUTH AND THROAT WITH OR WITHOUT PRESENCE OF ULCERS (sore throat, sores in mouth, or a toothache) UNUSUAL RASH, SWELLING OR PAIN  UNUSUAL VAGINAL DISCHARGE OR ITCHING   Items with * indicate a potential emergency and should be followed up as soon as possible or go to the Emergency Department if any problems should occur.  Please show the CHEMOTHERAPY ALERT CARD or IMMUNOTHERAPY ALERT CARD at  check-in to the Emergency Department and triage nurse.  Should you have questions after your visit or need to cancel or reschedule your appointment, please contact [facility]  Dept: [phone]  and follow the prompts.  Office hours are 8:00 a.m. to 4:30 p.m. Monday - Friday. Please note that voicemails left after 4:00 p.m. may not be returned until the following business day.  We are closed weekends and major holidays. You have access to a nurse at all times for urgent questions. Please call the main number to the clinic Dept: [phone] and follow the prompts.   For any non-urgent questions, you may also contact your provider using MyChart. We now offer e-Visits for anyone 73 and older to request care online for non-urgent symptoms. For details visit [email or web address].   Also download the MyChart app! Go to the app store, search "MyChart", open the app, select [facility], and log in with your MyChart username and password.  Due to Covid, a mask is required upon entering the hospital/clinic. If you do not have a mask, one will be given to you upon arrival. For doctor visits, patients may have 1 support person aged 50 or older with them. For treatment visits, patients cannot have anyone with them due to current Covid guidelines and our immunocompromised population.

## 2021-02-08 ENCOUNTER — Ambulatory Visit
Admission: RE | Admit: 2021-02-08 | Discharge: 2021-02-08 | Disposition: A | Discharge status: Home / Self Care | Payer: No Typology Code available for payment source | Source: Ambulatory Visit | Attending: Radiation Oncology | Admitting: Radiation Oncology

## 2021-02-08 ENCOUNTER — Telehealth: Payer: Self-pay | Admitting: Adult Health

## 2021-02-08 ENCOUNTER — Other Ambulatory Visit: Payer: Self-pay

## 2021-02-08 ENCOUNTER — Ambulatory Visit (HOSPITAL_COMMUNITY)
Admission: RE | Admit: 2021-02-08 | Discharge: 2021-02-08 | Disposition: A | Discharge status: Home / Self Care | Payer: No Typology Code available for payment source | Source: Ambulatory Visit | Attending: Adult Health | Admitting: Adult Health

## 2021-02-08 DIAGNOSIS — Z09 Encounter for follow-up examination after completed treatment for conditions other than malignant neoplasm: Secondary | ICD-10-CM | POA: Diagnosis not present

## 2021-02-08 DIAGNOSIS — Z170 Estrogen receptor positive status [ER+]: Secondary | ICD-10-CM | POA: Diagnosis not present

## 2021-02-08 DIAGNOSIS — Z79899 Other long term (current) drug therapy: Secondary | ICD-10-CM | POA: Insufficient documentation

## 2021-02-08 DIAGNOSIS — Z01810 Encounter for preprocedural cardiovascular examination: Secondary | ICD-10-CM | POA: Diagnosis present

## 2021-02-08 DIAGNOSIS — C50211 Malignant neoplasm of upper-inner quadrant of right female breast: Secondary | ICD-10-CM | POA: Insufficient documentation

## 2021-02-08 DIAGNOSIS — I10 Essential (primary) hypertension: Secondary | ICD-10-CM | POA: Insufficient documentation

## 2021-02-08 DIAGNOSIS — Z510 Encounter for antineoplastic radiation therapy: Secondary | ICD-10-CM | POA: Insufficient documentation

## 2021-02-08 DIAGNOSIS — Z0189 Encounter for other specified special examinations: Secondary | ICD-10-CM

## 2021-02-08 DIAGNOSIS — G4730 Sleep apnea, unspecified: Secondary | ICD-10-CM | POA: Insufficient documentation

## 2021-02-08 DIAGNOSIS — Z5111 Encounter for antineoplastic chemotherapy: Secondary | ICD-10-CM | POA: Insufficient documentation

## 2021-02-08 LAB — ECHOCARDIOGRAM COMPLETE
Area-P 1/2: 3.02 cm2
Single Plane A4C EF: 52 %

## 2021-02-08 NOTE — Progress Notes (Signed)
  Echocardiogram 2D Echocardiogram has been performed.  Grace Pennington 02/08/2021, 10:50 AM

## 2021-02-08 NOTE — Telephone Encounter (Signed)
Received Grace Pennington's echo results which show low normal EF of 50-55%, and abnormal septal motion.  I attempted to call her to review the results with her and I left a message on her voice mail asking her to call me back.    I sent a message to Dr. Aundra Dubin and Kevan Rosebush to see if they could see her prior to her 7/29 Herceptin appointment and evaluate the echo in more detail and provide some clarity on her cardiac function and status.    Wilber Bihari, NP

## 2021-02-13 DIAGNOSIS — Z510 Encounter for antineoplastic radiation therapy: Secondary | ICD-10-CM | POA: Diagnosis not present

## 2021-02-14 ENCOUNTER — Encounter: Payer: Self-pay | Admitting: *Deleted

## 2021-02-14 ENCOUNTER — Encounter: Payer: Self-pay | Admitting: Adult Health

## 2021-02-15 ENCOUNTER — Other Ambulatory Visit (HOSPITAL_COMMUNITY): Payer: Self-pay

## 2021-02-15 ENCOUNTER — Ambulatory Visit
Admission: RE | Admit: 2021-02-15 | Discharge: 2021-02-15 | Disposition: A | Discharge status: Home / Self Care | Payer: No Typology Code available for payment source | Source: Ambulatory Visit | Attending: Radiation Oncology | Admitting: Radiation Oncology

## 2021-02-15 ENCOUNTER — Other Ambulatory Visit: Payer: Self-pay

## 2021-02-15 DIAGNOSIS — Z510 Encounter for antineoplastic radiation therapy: Secondary | ICD-10-CM | POA: Diagnosis not present

## 2021-02-15 MED FILL — Spironolactone Tab 50 MG: ORAL | 30 days supply | Qty: 60 | Fill #1 | Status: AC

## 2021-02-15 MED FILL — Rosuvastatin Calcium Tab 10 MG: ORAL | 90 days supply | Qty: 90 | Fill #1 | Status: CN

## 2021-02-15 NOTE — Progress Notes (Signed)
Pt here for patient teaching.  Pt given Radiation and You booklet, skin care instructions, Alra deodorant, and Radiaplex gel.  Reviewed areas of pertinence such as fatigue, hair loss, skin changes, breast tenderness, and breast swelling . Pt able to give teach back of to pat skin and use unscented/gentle soap,apply Radiaplex bid, avoid applying anything to skin within 4 hours of treatment, avoid wearing an under wire bra, and to use an electric razor if they must shave. Pt verbalizes understanding of information given and will contact nursing with any questions or concerns.     [email or web address]  Jourdyn Ferrin M. Amaiah Cristiano RN, BSN       

## 2021-02-16 ENCOUNTER — Ambulatory Visit
Admission: RE | Admit: 2021-02-16 | Discharge: 2021-02-16 | Disposition: A | Discharge status: Home / Self Care | Payer: No Typology Code available for payment source | Source: Ambulatory Visit | Attending: Radiation Oncology | Admitting: Radiation Oncology

## 2021-02-16 DIAGNOSIS — Z170 Estrogen receptor positive status [ER+]: Secondary | ICD-10-CM

## 2021-02-16 DIAGNOSIS — Z510 Encounter for antineoplastic radiation therapy: Secondary | ICD-10-CM | POA: Diagnosis not present

## 2021-02-16 DIAGNOSIS — C50211 Malignant neoplasm of upper-inner quadrant of right female breast: Secondary | ICD-10-CM

## 2021-02-16 MED ORDER — ALRA NON-METALLIC DEODORANT (RAD-ONC)
1.0000 | Freq: Once | TOPICAL | Status: AC
Start: 2021-02-16 — End: 2021-02-16
  Administered 2021-02-16: 1 via TOPICAL

## 2021-02-16 MED ORDER — RADIAPLEXRX EX GEL: Freq: Once | CUTANEOUS | Status: AC

## 2021-02-19 ENCOUNTER — Ambulatory Visit
Admission: RE | Admit: 2021-02-19 | Discharge: 2021-02-19 | Disposition: A | Discharge status: Home / Self Care | Payer: No Typology Code available for payment source | Source: Ambulatory Visit | Attending: Radiation Oncology | Admitting: Radiation Oncology

## 2021-02-19 ENCOUNTER — Ambulatory Visit: Payer: No Typology Code available for payment source | Attending: General Surgery

## 2021-02-19 ENCOUNTER — Other Ambulatory Visit: Payer: Self-pay

## 2021-02-19 DIAGNOSIS — Z510 Encounter for antineoplastic radiation therapy: Secondary | ICD-10-CM | POA: Diagnosis not present

## 2021-02-20 ENCOUNTER — Ambulatory Visit
Admission: RE | Admit: 2021-02-20 | Discharge: 2021-02-20 | Disposition: A | Discharge status: Home / Self Care | Payer: No Typology Code available for payment source | Source: Ambulatory Visit | Attending: Radiation Oncology | Admitting: Radiation Oncology

## 2021-02-20 DIAGNOSIS — Z510 Encounter for antineoplastic radiation therapy: Secondary | ICD-10-CM | POA: Diagnosis not present

## 2021-02-21 ENCOUNTER — Ambulatory Visit: Payer: No Typology Code available for payment source

## 2021-02-22 ENCOUNTER — Ambulatory Visit
Admission: RE | Admit: 2021-02-22 | Discharge: 2021-02-22 | Disposition: A | Discharge status: Home / Self Care | Payer: No Typology Code available for payment source | Source: Ambulatory Visit | Attending: Radiation Oncology | Admitting: Radiation Oncology

## 2021-02-22 ENCOUNTER — Other Ambulatory Visit: Payer: Self-pay

## 2021-02-22 DIAGNOSIS — Z510 Encounter for antineoplastic radiation therapy: Secondary | ICD-10-CM | POA: Diagnosis not present

## 2021-02-23 ENCOUNTER — Inpatient Hospital Stay: Payer: No Typology Code available for payment source

## 2021-02-23 ENCOUNTER — Ambulatory Visit
Admission: RE | Admit: 2021-02-23 | Discharge: 2021-02-23 | Disposition: A | Discharge status: Home / Self Care | Payer: No Typology Code available for payment source | Source: Ambulatory Visit | Attending: Radiation Oncology | Admitting: Radiation Oncology

## 2021-02-23 ENCOUNTER — Inpatient Hospital Stay (HOSPITAL_BASED_OUTPATIENT_CLINIC_OR_DEPARTMENT_OTHER): Payer: No Typology Code available for payment source | Admitting: Adult Health

## 2021-02-23 ENCOUNTER — Encounter: Payer: Self-pay | Admitting: Adult Health

## 2021-02-23 VITALS — BP 100/84 | HR 88 | Temp 97.3°F | Resp 18 | Ht 66.0 in | Wt 261.0 lb

## 2021-02-23 DIAGNOSIS — Z170 Estrogen receptor positive status [ER+]: Secondary | ICD-10-CM

## 2021-02-23 DIAGNOSIS — Z510 Encounter for antineoplastic radiation therapy: Secondary | ICD-10-CM | POA: Diagnosis not present

## 2021-02-23 DIAGNOSIS — C50211 Malignant neoplasm of upper-inner quadrant of right female breast: Secondary | ICD-10-CM | POA: Diagnosis not present

## 2021-02-23 DIAGNOSIS — Z95828 Presence of other vascular implants and grafts: Secondary | ICD-10-CM

## 2021-02-23 LAB — CMP (CANCER CENTER ONLY)
ALT: 31 U/L (ref 0–44)
AST: 26 U/L (ref 15–41)
Albumin: 3.9 g/dL (ref 3.5–5.0)
Alkaline Phosphatase: 68 U/L (ref 38–126)
Anion gap: 9 (ref 5–15)
BUN: 13 mg/dL (ref 6–20)
CO2: 26 mmol/L (ref 22–32)
Calcium: 9.9 mg/dL (ref 8.9–10.3)
Chloride: 105 mmol/L (ref 98–111)
Creatinine: 1.07 mg/dL — ABNORMAL HIGH (ref 0.44–1.00)
GFR, Estimated: >60 mL/min (ref >60)
Glucose, Bld: 124 mg/dL — ABNORMAL HIGH (ref 70–99)
Potassium: 4.2 mmol/L (ref 3.5–5.1)
Sodium: 140 mmol/L (ref 135–145)
Total Bilirubin: 0.4 mg/dL (ref 0.3–1.2)
Total Protein: 7.4 g/dL (ref 6.5–8.1)

## 2021-02-23 LAB — CBC WITH DIFFERENTIAL (CANCER CENTER ONLY)
Abs Immature Granulocytes: 0.02 10*3/uL (ref 0.00–0.07)
Basophils Absolute: 0 10*3/uL (ref 0.0–0.1)
Basophils Relative: 1 %
Eosinophils Absolute: 0.2 10*3/uL (ref 0.0–0.5)
Eosinophils Relative: 3 %
HCT: 40.5 % (ref 36.0–46.0)
Hemoglobin: 13.6 g/dL (ref 12.0–15.0)
Immature Granulocytes: 0 %
Lymphocytes Relative: 27 %
Lymphs Abs: 1.8 10*3/uL (ref 0.7–4.0)
MCH: 30.7 pg (ref 26.0–34.0)
MCHC: 33.6 g/dL (ref 30.0–36.0)
MCV: 91.4 fL (ref 80.0–100.0)
Monocytes Absolute: 0.7 10*3/uL (ref 0.1–1.0)
Monocytes Relative: 11 %
Neutro Abs: 3.8 10*3/uL (ref 1.7–7.7)
Neutrophils Relative %: 58 %
Platelet Count: 307 10*3/uL (ref 150–400)
RBC: 4.43 MIL/uL (ref 3.87–5.11)
RDW: 15.9 % — ABNORMAL HIGH (ref 11.5–15.5)
WBC Count: 6.6 10*3/uL (ref 4.0–10.5)
nRBC: 0 % (ref 0.0–0.2)

## 2021-02-23 MED ORDER — DIPHENHYDRAMINE HCL 25 MG PO CAPS
50.0000 mg | ORAL_CAPSULE | Freq: Once | ORAL | Status: AC
  Administered 2021-02-23: 50 mg via ORAL

## 2021-02-23 MED ORDER — SODIUM CHLORIDE 0.9% FLUSH
10.0000 mL | INTRAVENOUS | Status: DC | PRN
  Administered 2021-02-23: 10 mL
  Filled 2021-02-23: qty 10

## 2021-02-23 MED ORDER — ACETAMINOPHEN 325 MG PO TABS
ORAL_TABLET | ORAL | Status: AC
  Filled 2021-02-23: qty 2

## 2021-02-23 MED ORDER — DIPHENHYDRAMINE HCL 25 MG PO CAPS
ORAL_CAPSULE | ORAL | Status: AC
  Filled 2021-02-23: qty 2

## 2021-02-23 MED ORDER — SODIUM CHLORIDE 0.9% FLUSH
10.0000 mL | Freq: Once | INTRAVENOUS | Status: AC
  Administered 2021-02-23: 10 mL
  Filled 2021-02-23: qty 10

## 2021-02-23 MED ORDER — TRASTUZUMAB-DKST CHEMO 150 MG IV SOLR
6.0000 mg/kg | Freq: Once | INTRAVENOUS | Status: AC
  Administered 2021-02-23: 693 mg via INTRAVENOUS
  Filled 2021-02-23: qty 33

## 2021-02-23 MED ORDER — HEPARIN SOD (PORK) LOCK FLUSH 100 UNIT/ML IV SOLN
500.0000 [IU] | Freq: Once | INTRAVENOUS | Status: AC | PRN
  Administered 2021-02-23: 500 [IU]
  Filled 2021-02-23: qty 5

## 2021-02-23 MED ORDER — SODIUM CHLORIDE 0.9 % IV SOLN
Freq: Once | INTRAVENOUS | Status: AC
  Filled 2021-02-23: qty 250

## 2021-02-23 MED ORDER — ACETAMINOPHEN 325 MG PO TABS
650.0000 mg | ORAL_TABLET | Freq: Once | ORAL | Status: AC
  Administered 2021-02-23: 650 mg via ORAL

## 2021-02-23 NOTE — Patient Instructions (Signed)
[facility]  Discharge Instructions: Thank you for choosing [facility] to provide your oncology and hematology care.   If you have a lab appointment with the Cancer Center, please go directly to the Cancer Center and check in at the registration area.   Wear comfortable clothing and clothing appropriate for easy access to any Portacath or PICC line.   We strive to give you quality time with your provider. You may need to reschedule your appointment if you arrive late (15 or more minutes).  Arriving late affects you and other patients whose appointments are after yours.  Also, if you miss three or more appointments without notifying the office, you may be dismissed from the clinic at the provider's discretion.      For prescription refill requests, have your pharmacy contact our office and allow 72 hours for refills to be completed.    Today you received the following chemotherapy and/or immunotherapy agents : Herceptin     To help prevent nausea and vomiting after your treatment, we encourage you to take your nausea medication as directed.  BELOW ARE SYMPTOMS THAT SHOULD BE REPORTED IMMEDIATELY: *FEVER GREATER THAN 100.4 F (38 C) OR HIGHER *CHILLS OR SWEATING *NAUSEA AND VOMITING THAT IS NOT CONTROLLED WITH YOUR NAUSEA MEDICATION *UNUSUAL SHORTNESS OF BREATH *UNUSUAL BRUISING OR BLEEDING *URINARY PROBLEMS (pain or burning when urinating, or frequent urination) *BOWEL PROBLEMS (unusual diarrhea, constipation, pain near the anus) TENDERNESS IN MOUTH AND THROAT WITH OR WITHOUT PRESENCE OF ULCERS (sore throat, sores in mouth, or a toothache) UNUSUAL RASH, SWELLING OR PAIN  UNUSUAL VAGINAL DISCHARGE OR ITCHING   Items with * indicate a potential emergency and should be followed up as soon as possible or go to the Emergency Department if any problems should occur.  Please show the CHEMOTHERAPY ALERT CARD or IMMUNOTHERAPY ALERT CARD at check-in to  the Emergency Department and triage nurse.  Should you have questions after your visit or need to cancel or reschedule your appointment, please contact [facility]  Dept: [phone]  and follow the prompts.  Office hours are 8:00 a.m. to 4:30 p.m. Monday - Friday. Please note that voicemails left after 4:00 p.m. may not be returned until the following business day.  We are closed weekends and major holidays. You have access to a nurse at all times for urgent questions. Please call the main number to the clinic Dept: [phone] and follow the prompts.   For any non-urgent questions, you may also contact your provider using MyChart. We now offer e-Visits for anyone 18 and older to request care online for non-urgent symptoms. For details visit [email or web address].   Also download the MyChart app! Go to the app store, search "MyChart", open the app, select [facility], and log in with your MyChart username and password.  Due to Covid, a mask is required upon entering the hospital/clinic. If you do not have a mask, one will be given to you upon arrival. For doctor visits, patients may have 1 support person aged 18 or older with them. For treatment visits, patients cannot have anyone with them due to current Covid guidelines and our immunocompromised population.   

## 2021-02-23 NOTE — Progress Notes (Signed)
[facility] Follow up:    Seward Carol, MD [address] [place] [address]   DIAGNOSIS: Cancer Staging Malignant neoplasm of upper-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, estrogen receptor positive ([place]) Staging form: Breast, AJCC 8th Edition - Clinical stage from 09/08/2020: Stage IA (cT1b, cN0, cM0, G2, ER+, PR+, HER2+) - Signed by Gardenia Phlegm, NP on 09/20/2020 Stage prefix: Initial diagnosis - Pathologic stage from 10/16/2020: Stage IA (pT1b, pN0, cM0, G3, ER+, PR+, HER2+) - Signed by Gardenia Phlegm, NP on 11/01/2020 Histologic grading system: 3 grade system   SUMMARY OF ONCOLOGIC HISTORY: Oncology History  Malignant neoplasm of upper-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, estrogen receptor positive ([place])  09/08/2020 Cancer Staging   Staging form: Breast, AJCC 8th Edition - Clinical stage from 09/08/2020: Stage IA (cT1b, cN0, cM0, G2, ER+, PR+, HER2+) - Signed by Gardenia Phlegm, NP on 09/20/2020  Stage prefix: Initial diagnosis    09/08/2020 Initial Diagnosis   Mammogram detected 0.7 cm mass in the right breast 1 o'clock position, right breast biopsy 1: Grade 2 IDC ER 95% PR 95%, Ki-67 30%, HER-2 positive ratio 2.83   10/16/2020 Surgery   Right lumpectomy [name]): IDC, 1.0cm, grade 3, with high grade DCIS, clear margins, 4 right axillary lymph nodes negative for carcinoma.   10/16/2020 Cancer Staging   Staging form: Breast, AJCC 8th Edition - Pathologic stage from 10/16/2020: Stage IA (pT1b, pN0, cM0, G3, ER+, PR+, HER2+) - Signed by Gardenia Phlegm, NP on 11/01/2020  Histologic grading system: 3 grade system    11/13/2020 -  Chemotherapy    Patient is on Treatment Plan: BREAST PACLITAXEL + TRASTUZUMAB Q7D / TRASTUZUMAB Q21D         CURRENT THERAPY: Herceptin; adjuvant radiation therapy  INTERVAL HISTORY: ZAMIYAH RESENDES 50 y.o. female returns for evaluation prior to receiving Hercpetin.  She  is tolerating the treatment well.  She started radiation and is feeling slightly tired, but has no skin breakdown at this point.  She notes that once her chemotherapy had finished she noted some mild peripheral neuropathy.  There are no motor deficits and it is a mild sensory neuropathy.   Her most recent EF was 50-55% on 02/08/2021.  This showed a mild increase in her GLS.  She has no weight gain, DOE, orthopnea, chest pain/palpitations or other signs of heart failure.  She is mildly fatigued considering her previous treatments, but nothing significant that she has noted.    Patient Active Problem List   Diagnosis Date Noted   Port-A-Cath in place 11/20/2020   Malignant neoplasm of upper-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, estrogen receptor positive ([place]) 09/20/2020   Precordial pain 04/14/2014   Essential hypertension, benign 08/03/2013   Family history of ischemic heart disease 08/03/2013    has No Known Allergies.  MEDICAL HISTORY: Past Medical History:  Diagnosis Date   Anxiety    Breast cancer in female [facility])    Right   Depression    GERD (gastroesophageal reflux disease)    Hypertension    Palpitations    Sleep apnea     SURGICAL HISTORY: Past Surgical History:  Procedure Laterality Date   BREAST LUMPECTOMY WITH RADIOACTIVE SEED AND SENTINEL LYMPH NODE BIOPSY Right 10/16/2020   Procedure: RIGHT BREAST LUMPECTOMY WITH RADIOACTIVE SEED AND SENTINEL LYMPH NODE BIOPSY;  Surgeon: [name], MD;  Location: [facility];  Service: General;  Laterality: Right;   [place]  Left 10/16/2020   Procedure: INSERTION PORT-A-CATH;  Surgeon: [name], MD;  Location: [facility] OR;  Service: General;  Laterality: Left;   TUBAL LIGATION      SOCIAL HISTORY: Social History   Socioeconomic History   Marital status: Single    Spouse name: Not on file   Number of children: 2   Years of education: Not on file   Highest education level: Not on file  Occupational History    Occupation: medical records    Employer: [employer]  Tobacco Use   Smoking status: Former    Types: Cigarettes    Quit date: 07/29/1996    Years since quitting: 24.5   Smokeless tobacco: Never  Vaping Use   Vaping Use: Never used  Substance and Sexual Activity   Alcohol use: Yes    Comment: socially   Drug use: No   Sexual activity: Yes    Birth control/protection: Surgical  Other Topics Concern   Not on file  Social History Narrative   Not on file   Social Determinants of Health   Financial Resource Strain: Not on file  Food Insecurity: Not on file  Transportation Needs: Not on file  Physical Activity: Not on file  Stress: Not on file  Social Connections: Not on file  Intimate Partner Violence: Not At Risk   Fear of Current or Ex-Partner: No   Emotionally Abused: No   Physically Abused: No   Sexually Abused: No    FAMILY HISTORY: Family History  Problem Relation Age of Onset   Heart disease Mother    Diabetes Mother    Hyperlipidemia Mother    Hypertension Mother    Kidney failure Mother    Diabetes Other    Stroke Other    Hypertension Other    Kidney failure Other    Coronary artery disease Other    Sleep apnea Father    Diabetes Father    Hyperlipidemia Father    Hypertension Father     Review of Systems  Constitutional:  Positive for fatigue. Negative for appetite change, chills, fever and unexpected weight change.  HENT:   Negative for hearing loss, lump/mass and trouble swallowing.   Eyes:  Negative for eye problems and icterus.  Respiratory:  Negative for chest tightness, cough and shortness of breath.   Cardiovascular:  Negative for chest pain, leg swelling and palpitations.  Gastrointestinal:  Negative for abdominal distention, abdominal pain, constipation, diarrhea, nausea and vomiting.  Endocrine: Negative for hot flashes.  Genitourinary:  Negative for difficulty urinating.   Musculoskeletal:  Negative for arthralgias.  Skin:  Negative for  itching and rash.  Neurological:  Negative for dizziness, extremity weakness, headaches and numbness.  Hematological:  Negative for adenopathy. Does not bruise/bleed easily.  Psychiatric/Behavioral:  Negative for depression. The patient is not nervous/anxious.      PHYSICAL EXAMINATION  ECOG PERFORMANCE STATUS: 1 - Symptomatic but completely ambulatory  Vitals:   02/23/21 1236  BP: 100/84  Pulse: 88  Resp: 18  Temp: (!) 97.3 F (36.3 C)  SpO2: 100%    Physical Exam Constitutional:      General: She is not in acute distress.    Appearance: Normal appearance. She is not toxic-appearing.  HENT:     Head: Normocephalic and atraumatic.  Eyes:     General: No scleral icterus. Cardiovascular:     Rate and Rhythm: Normal rate and regular rhythm.     Pulses: Normal pulses.     Heart sounds:  Normal heart sounds.  Pulmonary:     Effort: Pulmonary effort is normal.     Breath sounds: Normal breath sounds.  Abdominal:     General: Abdomen is flat. Bowel sounds are normal. There is no distension.     Palpations: Abdomen is soft.     Tenderness: There is no abdominal tenderness.  Musculoskeletal:        General: No swelling.     Cervical back: Neck supple.  Lymphadenopathy:     Cervical: No cervical adenopathy.  Skin:    General: Skin is warm and dry.     Findings: No rash.  Neurological:     General: No focal deficit present.     Mental Status: She is alert.  Psychiatric:        Mood and Affect: Mood normal.        Behavior: Behavior normal.    LABORATORY DATA:  CBC    Component Value Date/Time   WBC 6.6 02/23/2021 1216   WBC 5.6 10/11/2020 1013   RBC 4.43 02/23/2021 1216   HGB 13.6 02/23/2021 1216   HGB 12.6 10/07/2017 1200   HCT 40.5 02/23/2021 1216   HCT 39.4 10/07/2017 1200   PLT 307 02/23/2021 1216   MCV 91.4 02/23/2021 1216   MCV 83 10/07/2017 1200   MCH 30.7 02/23/2021 1216   MCHC 33.6 02/23/2021 1216   RDW 15.9 (H) 02/23/2021 1216   RDW 16.6 (H)  10/07/2017 1200   LYMPHSABS 1.8 02/23/2021 1216   LYMPHSABS 2.8 10/07/2017 1200   MONOABS 0.7 02/23/2021 1216   EOSABS 0.2 02/23/2021 1216   EOSABS 0.2 10/07/2017 1200   BASOSABS 0.0 02/23/2021 1216   BASOSABS 0.0 10/07/2017 1200    CMP     Component Value Date/Time   NA 140 02/23/2021 1216   NA 143 08/13/2018 0749   K 4.2 02/23/2021 1216   CL 105 02/23/2021 1216   CO2 26 02/23/2021 1216   GLUCOSE 124 (H) 02/23/2021 1216   BUN 13 02/23/2021 1216   BUN 19 08/13/2018 0749   CREATININE 1.07 (H) 02/23/2021 1216   CALCIUM 9.9 02/23/2021 1216   PROT 7.4 02/23/2021 1216   PROT 7.3 08/13/2018 0749   ALBUMIN 3.9 02/23/2021 1216   ALBUMIN 4.2 08/13/2018 0749   AST 26 02/23/2021 1216   ALT 31 02/23/2021 1216   ALKPHOS 68 02/23/2021 1216   BILITOT 0.4 02/23/2021 1216   GFRNONAA >60 02/23/2021 1216   GFRAA 83 08/13/2018 0749      ASSESSMENT and THERAPY PLAN:   Malignant neoplasm of upper-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, estrogen receptor positive ([facility]) 09/08/2020:Mammogram detected 0.7 cm mass in the right breast 1 o'clock position, right breast biopsy 1: Grade 2 IDC ER 95% PR 95%, Ki-67 30%, HER-2 positive ratio 2.83 T1b N0 stage Ia   10/16/20: Grade 3 IDC with DCIS 0/4 LN  ER 95% PR 95%, Ki-67 30%, HER-2 positive ratio 2.83   Treatment Plan: 1. adjuvant Taxol Herceptin followed by Herceptin maintenance for 1 year plan to start in 3 weeks 2.  Adjuvant radiation 3.  Followed by adjuvant antiestrogen therapy -------------------------------------------------------------------------------------------------------------------------- Current Treatment: Herceptin; adjvuant radiation therapy Chemo Toxicities:  1. Fatigue: continuing to exercise which is beneficial 2. Mild peripheral neuropathy: this didn't present until after chemotherapy completion.  Monitoring for now.   3. ? Mild decrease in EF and increase in GLS.  I placed a referral to the CHF clinic.  She has no signs  or symptoms of  heart failure.  I asked that Dr. Haroldine Laws or Dr. Aundra Dubin get her in prior to her next treatment.   We reviewed her upcoming antiestrogen therapy choices.  She will return after radiation is complete to discuss these further.   She will return every three weeks for Herceptin, and she is due to start antiestrogen therapy soon.     Orders Placed This Encounter  Procedures   AMB referral to CHF clinic    Referral Priority:   Urgent    Referral Type:   Consultation    Number of Visits Requested:   1    All questions were answered. The patient knows to call the clinic with any problems, questions or concerns. We can certainly see the patient much sooner if necessary.  Total encounter time: 20 minutes in face to face visit time, chart review, lab review, order entry, care coordination and documentation of the encounter.   Wilber Bihari, NP 02/25/21 12:25 PM Medical Oncology and Hematology [facility] [place], [place] [address] Tel. [phone]    Fax. [phone]  *Total Encounter Time as defined by the Centers for Medicare and Medicaid Services includes, in addition to the face-to-face time of a patient visit (documented in the note above) non-face-to-face time: obtaining and reviewing outside history, ordering and reviewing medications, tests or procedures, care coordination (communications with other health care professionals or caregivers) and documentation in the medical record.

## 2021-02-23 NOTE — Assessment & Plan Note (Addendum)
09/08/2020:Mammogram detected 0.7 cm mass in the right breast 1 o'clock position, right breast biopsy 1: Grade 2 IDC ER 95% PR 95%, Ki-67 30%, HER-2 positive ratio 2.83 T1b N0 stage Ia  10/16/20: Grade 3 IDC with DCIS 0/4 LNER 95% PR 95%, Ki-67 30%, HER-2 positive ratio 2.83  Treatment Plan: 1.adjuvant Taxol Herceptin followed by Herceptin maintenance for 1 yearplan to start in 3 weeks 2.Adjuvant radiation 3.Followed by adjuvant antiestrogen therapy -------------------------------------------------------------------------------------------------------------------------- Current Treatment: Herceptin; adjvuant radiation therapy Chemo Toxicities: 1. Fatigue: continuing to exercise which is beneficial 2. Mild peripheral neuropathy: this didn't present until after chemotherapy completion.  Monitoring for now.   3. ? Mild decrease in EF and increase in GLS.  I placed a referral to the CHF clinic.  She has no signs or symptoms of heart failure.  I asked that Dr. Haroldine Laws or Dr. Aundra Dubin get her in prior to her next treatment.   We reviewed her upcoming antiestrogen therapy choices.  She will return after radiation is complete to discuss these further.   She will return every three weeks for Herceptin, and she is due to start antiestrogen therapy soon.

## 2021-02-25 ENCOUNTER — Encounter: Payer: Self-pay | Admitting: Hematology and Oncology

## 2021-02-26 ENCOUNTER — Other Ambulatory Visit: Payer: Self-pay

## 2021-02-26 ENCOUNTER — Ambulatory Visit
Admission: RE | Admit: 2021-02-26 | Discharge: 2021-02-26 | Disposition: A | Discharge status: Home / Self Care | Payer: No Typology Code available for payment source | Source: Ambulatory Visit | Attending: Radiation Oncology | Admitting: Radiation Oncology

## 2021-02-26 DIAGNOSIS — C50211 Malignant neoplasm of upper-inner quadrant of right female breast: Secondary | ICD-10-CM | POA: Insufficient documentation

## 2021-02-26 DIAGNOSIS — Z510 Encounter for antineoplastic radiation therapy: Secondary | ICD-10-CM | POA: Insufficient documentation

## 2021-02-26 DIAGNOSIS — Z170 Estrogen receptor positive status [ER+]: Secondary | ICD-10-CM | POA: Insufficient documentation

## 2021-02-26 DIAGNOSIS — Z5111 Encounter for antineoplastic chemotherapy: Secondary | ICD-10-CM | POA: Diagnosis present

## 2021-02-27 ENCOUNTER — Ambulatory Visit
Admission: RE | Admit: 2021-02-27 | Discharge: 2021-02-27 | Disposition: A | Discharge status: Home / Self Care | Payer: No Typology Code available for payment source | Source: Ambulatory Visit | Attending: Radiation Oncology | Admitting: Radiation Oncology

## 2021-02-27 DIAGNOSIS — Z5111 Encounter for antineoplastic chemotherapy: Secondary | ICD-10-CM | POA: Diagnosis not present

## 2021-02-28 ENCOUNTER — Other Ambulatory Visit: Payer: Self-pay

## 2021-02-28 ENCOUNTER — Ambulatory Visit
Admission: RE | Admit: 2021-02-28 | Discharge: 2021-02-28 | Disposition: A | Discharge status: Home / Self Care | Payer: No Typology Code available for payment source | Source: Ambulatory Visit | Attending: Radiation Oncology | Admitting: Radiation Oncology

## 2021-02-28 ENCOUNTER — Telehealth: Payer: Self-pay | Admitting: *Deleted

## 2021-02-28 DIAGNOSIS — Z5111 Encounter for antineoplastic chemotherapy: Secondary | ICD-10-CM | POA: Diagnosis not present

## 2021-02-28 NOTE — Telephone Encounter (Signed)
"  Grace Pennington.  I work for [employer].  [employer] just called me saying I need a work accommodation because my FMLA has expired.  Is there a fax number [employer] can send ADA form to office?  I am not able to print what was sent to me."  Provided [phone] fax number for this nurse to assist.

## 2021-03-01 ENCOUNTER — Ambulatory Visit
Admission: RE | Admit: 2021-03-01 | Discharge: 2021-03-01 | Disposition: A | Discharge status: Home / Self Care | Payer: No Typology Code available for payment source | Source: Ambulatory Visit | Attending: Radiation Oncology | Admitting: Radiation Oncology

## 2021-03-01 DIAGNOSIS — Z5111 Encounter for antineoplastic chemotherapy: Secondary | ICD-10-CM | POA: Diagnosis not present

## 2021-03-02 ENCOUNTER — Other Ambulatory Visit: Payer: Self-pay

## 2021-03-02 ENCOUNTER — Ambulatory Visit
Admission: RE | Admit: 2021-03-02 | Discharge: 2021-03-02 | Disposition: A | Discharge status: Home / Self Care | Payer: No Typology Code available for payment source | Source: Ambulatory Visit | Attending: Radiation Oncology | Admitting: Radiation Oncology

## 2021-03-02 DIAGNOSIS — Z5111 Encounter for antineoplastic chemotherapy: Secondary | ICD-10-CM | POA: Diagnosis not present

## 2021-03-05 ENCOUNTER — Other Ambulatory Visit (HOSPITAL_COMMUNITY): Payer: Self-pay

## 2021-03-05 ENCOUNTER — Other Ambulatory Visit: Payer: Self-pay

## 2021-03-05 ENCOUNTER — Ambulatory Visit (HOSPITAL_BASED_OUTPATIENT_CLINIC_OR_DEPARTMENT_OTHER)
Admission: RE | Admit: 2021-03-05 | Discharge: 2021-03-05 | Disposition: A | Discharge status: Home / Self Care | Payer: No Typology Code available for payment source | Source: Ambulatory Visit | Attending: Internal Medicine | Admitting: Internal Medicine

## 2021-03-05 ENCOUNTER — Encounter (HOSPITAL_COMMUNITY): Payer: Self-pay | Admitting: Internal Medicine

## 2021-03-05 ENCOUNTER — Ambulatory Visit
Admission: RE | Admit: 2021-03-05 | Discharge: 2021-03-05 | Disposition: A | Discharge status: Home / Self Care | Payer: No Typology Code available for payment source | Source: Ambulatory Visit | Attending: Radiation Oncology | Admitting: Radiation Oncology

## 2021-03-05 VITALS — BP 110/78 | HR 83 | Wt 260.6 lb

## 2021-03-05 DIAGNOSIS — C50211 Malignant neoplasm of upper-inner quadrant of right female breast: Secondary | ICD-10-CM

## 2021-03-05 DIAGNOSIS — Z01810 Encounter for preprocedural cardiovascular examination: Secondary | ICD-10-CM | POA: Insufficient documentation

## 2021-03-05 DIAGNOSIS — R0683 Snoring: Secondary | ICD-10-CM

## 2021-03-05 DIAGNOSIS — Z170 Estrogen receptor positive status [ER+]: Secondary | ICD-10-CM

## 2021-03-05 DIAGNOSIS — Z5111 Encounter for antineoplastic chemotherapy: Secondary | ICD-10-CM | POA: Diagnosis not present

## 2021-03-05 MED ORDER — LOSARTAN POTASSIUM 25 MG PO TABS
25.0000 mg | ORAL_TABLET | Freq: Every day | ORAL | 2 refills | Status: DC
  Filled 2021-03-05: qty 90, 90d supply, fill #0
  Filled 2021-05-29: qty 90, 90d supply, fill #1

## 2021-03-05 NOTE — Progress Notes (Signed)
CARDIO-ONCOLOGY CLINIC CONSULT NOTE  Referring Physician:Gudena, Vinay Primary Care:Polite,  Jori Moll Primary Cardiologist:Jermanie Minshall, Quillian Quince  HPI:  Grace Pennington is 50 y.o. female with HTN, Depression, Anxiety,  Breast Cancer referred by Dr. Nicholas Lose for enrollment into the Cardio-Oncology program.  09/08/2020: Stage IA (cT1b, cN0, cM0, G2, ER+, PR+, HER2+).  10/16/20 right lumpectomy high grade DCIS, clear margins, 4 right axillary lymph nodes negative for carcinoma.  Started on BREAST PACLITAXEL + TRASTUZUMAB Q7D / TRASTUZUMAB Q21D which she recently finished. She will start on Herceptin maintenance for 1 year plan to start 02/2021.  Adjuvant radiation started 01/2021.  Also will be started on antiestrogen therapy.    10/2020 ECHO EF 60-65%, GLS -24.8% 01/2021 ECHO EF 50-55% abnormal septal motion, GLS -18.9% partially brought down by apical 3C view, ? poor visualization of lateral wall.  Maybe subtle decrease in EF/increase in GLS but not clinically significant  Has had some mild increase of dyspnea which started after initial chemotherapy with paclitaxel regimen.  Usually happened for the first few weeks after treatments. She currently feels back to her baseline. Walked 4 miles Saturday night with no issues.  No chest pain, dyspnea PND.     Review of Systems: [y] = yes, [ ]  = no   General: Weight gain [ ] ; Weight loss [ ] ; Anorexia [ ] ; Fatigue [ ] ; Fever [ ] ; Chills [ ] ; Weakness [ ]   Cardiac: Chest pain/pressure [ ] ; Resting SOB [ ] ; Exertional SOB [ ] ; Orthopnea [ ] ; Pedal Edema [ ] ; Palpitations [ ] ; Syncope [ ] ; Presyncope [ ] ; Paroxysmal nocturnal dyspnea[ ]   Pulmonary: Cough [ ] ; Wheezing[ ] ; Hemoptysis[ ] ; Sputum [ ] ; Snoring [ ]   GI: Vomiting[ ] ; Dysphagia[ ] ; Melena[ ] ; Hematochezia [ ] ; Heartburn[ ] ; Abdominal pain [ ] ; Constipation [ ] ; Diarrhea [ ] ; BRBPR [ ]   GU: Hematuria[ ] ; Dysuria [ ] ; Nocturia[ ]   Vascular: Pain in legs with walking [ ] ; Pain in feet with lying flat [ ] ;  Non-healing sores [ ] ; Stroke [ ] ; TIA [ ] ; Slurred speech [ ] ;  Neuro: Headaches[ ] ; Vertigo[ ] ; Seizures[ ] ; Paresthesias[ ] ;Blurred vision [ ] ; Diplopia [ ] ; Vision changes [ ]   Ortho/Skin: Arthritis [ ] ; Joint pain [ ] ; Muscle pain [ ] ; Joint swelling [ ] ; Back Pain [ ] ; Rash [ ]   Psych: Depression[ ] ; Anxiety[ ]   Heme: Bleeding problems [ ] ; Clotting disorders [ ] ; Anemia [ ]   Endocrine: Diabetes [ ] ; Thyroid dysfunction[ ]    Past Medical History:  Diagnosis Date   Anxiety    Breast cancer in female [facility])    Right   Depression    GERD (gastroesophageal reflux disease)    Hypertension    Palpitations    Sleep apnea     Current Outpatient Medications  Medication Sig Dispense Refill   citalopram (CELEXA) 10 MG tablet TAKE 1 TABLET BY MOUTH ONCE DAILY 90 tablet 3   fluticasone (FLONASE) 50 MCG/ACT nasal spray Place 2 sprays into both nostrils daily as needed for allergies.     hydrochlorothiazide (HYDRODIURIL) 25 MG tablet TAKE 1 TABLET BY MOUTH ONCE DAILY 90 tablet 3   hydroxypropyl methylcellulose / hypromellose (ISOPTO TEARS / GONIOVISC) 2.5 % ophthalmic solution Place 1 drop into both eyes 3 (three) times daily as needed (dry/irritated eyes).     lidocaine-prilocaine (EMLA) cream APPLY TO AFFECTED AREA ONCE 30 g 3   loratadine (CLARITIN) 10 MG tablet Take 10 mg by mouth daily as needed for  allergies.     losartan (COZAAR) 25 MG tablet Take 1 tablet (25 mg total) by mouth daily. 90 tablet 2   metFORMIN (GLUCOPHAGE) 1000 MG tablet Take 1,000 mg by mouth 2 (two) times daily with a meal.     Multiple Vitamins-Minerals (MULTIVITAMIN WITH MINERALS) tablet Take 1 tablet by mouth daily.     pantoprazole (PROTONIX) 40 MG tablet TAKE 1 TABLET BY MOUTH DAILY 90 tablet 1   rosuvastatin (CRESTOR) 10 MG tablet TAKE 1 TABLET BY MOUTH ONCE A DAY 90 tablet 3   spironolactone (ALDACTONE) 50 MG tablet TAKE 1 TABLET BY MOUTH 2 TIMES DAILY 60 tablet 12   No current facility-administered  medications for this encounter.    No Known Allergies    Social History   Socioeconomic History   Marital status: Single    Spouse name: Not on file   Number of children: 2   Years of education: Not on file   Highest education level: Not on file  Occupational History   Occupation: medical records    Employer: [employer]  Tobacco Use   Smoking status: Former    Types: Cigarettes    Quit date: 07/29/1996    Years since quitting: 24.6   Smokeless tobacco: Never  Vaping Use   Vaping Use: Never used  Substance and Sexual Activity   Alcohol use: Yes    Comment: socially   Drug use: No   Sexual activity: Yes    Birth control/protection: Surgical  Other Topics Concern   Not on file  Social History Narrative   Not on file   Social Determinants of Health   Financial Resource Strain: Not on file  Food Insecurity: Not on file  Transportation Needs: Not on file  Physical Activity: Not on file  Stress: Not on file  Social Connections: Not on file  Intimate Partner Violence: Not At Risk   Fear of Current or Ex-Partner: No   Emotionally Abused: No   Physically Abused: No   Sexually Abused: No      Family History  Problem Relation Age of Onset   Heart disease Mother    Diabetes Mother    Hyperlipidemia Mother    Hypertension Mother    Kidney failure Mother    Diabetes Other    Stroke Other    Hypertension Other    Kidney failure Other    Coronary artery disease Other    Sleep apnea Father    Diabetes Father    Hyperlipidemia Father    Hypertension Father     Vitals:   03/05/21 1037  BP: 110/78  Pulse: 83  SpO2: 97%  Weight: 118.2 kg (260 lb 9.6 oz)    PHYSICAL EXAM: General:  Well appearing. No respiratory difficulty HEENT: normal Neck: supple. no JVD. Carotids 2+ bilat; no bruits. No lymphadenopathy or thryomegaly appreciated. Cor: PMI nondisplaced. Regular rate & rhythm. No rubs, gallops or murmurs. Lungs: clear Abdomen: soft, nontender,  nondistended. No hepatosplenomegaly. No bruits or masses. Good bowel sounds. Extremities: no cyanosis, clubbing, rash, edema Neuro: alert & oriented x 3, cranial nerves grossly intact. moves all 4 extremities w/o difficulty. Affect pleasant.   ASSESSMENT & PLAN: 1. Stage IA (cT1b, cN0, cM0, G2, ER+, PR+, HER2+) R Breast Cancer -Maybe subtle decrease in EF/increase in GLS but not clinically significant -Start Losartan 62m daily -Sleep Study -Continue herceptin, we will have her in for more frequent ECHO monitoring move to Q2 months adjust frequency as needed  WGuadlupe Spanish MD  Patient seen and examined with the above-signed Advanced Practice Provider and/or Housestaff. I personally reviewed laboratory data, imaging studies and relevant notes. I independently examined the patient and formulated the important aspects of the plan. I have edited the note to reflect any of my changes or salient points. I have personally discussed the plan with the patient and/or family.  Feels well. On Herceptin for recently diagnosed breast CA.   General:  Well appearing. No resp difficulty HEENT: normal Neck: supple. no JVD. Carotids 2+ bilat; no bruits. No lymphadenopathy or thryomegaly appreciated. Cor: PMI nondisplaced. Regular rate & rhythm. No rubs, gallops or murmurs. Lungs: clear Abdomen: obese soft, nontender, nondistended. No hepatosplenomegaly. No bruits or masses. Good bowel sounds. Extremities: no cyanosis, clubbing, rash, edema Neuro: alert & orientedx3, cranial nerves grossly intact. moves all 4 extremities w/o difficulty. Affect pleasant  Echos reviewed personally. Over fairly similar, that said, on the most recent echo, her EF may be down very slightly in the setting of septal dyssynergy. I do not think it is convincing enough to hold Herceptin at this point.   Will continue Herceptin. Start losartan 25 daily. Repeat echo in 2 months (after 2 more rounds Herceptin). With snoring and am  fatigue, will check home sleep study to evaluate for OSA.   Glori Bickers, MD  2:58 PM

## 2021-03-05 NOTE — Patient Instructions (Addendum)
START Losartan 25 mg (1 tablet) daily  Your physician recommends that you schedule a follow-up appointment [id number] September 6th 2022 at  8:am for Echocardiogram and to follow a appointment with Dr. Haroldine Laws at 9:00 am  You also will be schedule for a [place] Sleep Study this study requires insurance approval, you will be called to make the appointment   milAt the [facility], you and your health needs are our priority. As part of our continuing mission to provide you with exceptional heart care, we have created designated Provider Care Teams. These Care Teams include your primary Cardiologist (physician) and Advanced Practice Providers (APPs- Physician Assistants and Nurse Practitioners) who all work together to provide you with the care you need, when you need it.   You may see any of the following providers on your designated Care Team at your next follow up: Dr Glori Bickers Dr Loralie Champagne Dr Patrice Paradise, NP Lyda Jester, [place] Ginnie Smart Audry Riles, PharmD   Please be sure to bring in all your medications bottles to every appointment.    If you have any questions or concerns before your next appointment please send [place] a message through [place] or call our office at [phone].    TO LEAVE A MESSAGE FOR THE NURSE SELECT OPTION 2, PLEASE LEAVE A MESSAGE INCLUDING: YOUR NAME DATE OF BIRTH CALL BACK NUMBER REASON FOR CALL**this is important as we prioritize the call backs  YOU WILL RECEIVE A CALL BACK THE SAME DAY AS LONG AS YOU CALL BEFORE 4:00 PM

## 2021-03-06 ENCOUNTER — Ambulatory Visit
Admission: RE | Admit: 2021-03-06 | Discharge: 2021-03-06 | Disposition: A | Discharge status: Home / Self Care | Payer: No Typology Code available for payment source | Source: Ambulatory Visit | Attending: Radiation Oncology | Admitting: Radiation Oncology

## 2021-03-06 DIAGNOSIS — Z5111 Encounter for antineoplastic chemotherapy: Secondary | ICD-10-CM | POA: Diagnosis not present

## 2021-03-07 ENCOUNTER — Telehealth (HOSPITAL_COMMUNITY): Payer: Self-pay | Admitting: *Deleted

## 2021-03-07 ENCOUNTER — Ambulatory Visit
Admission: RE | Admit: 2021-03-07 | Discharge: 2021-03-07 | Disposition: A | Discharge status: Home / Self Care | Payer: No Typology Code available for payment source | Source: Ambulatory Visit | Attending: Radiation Oncology | Admitting: Radiation Oncology

## 2021-03-07 ENCOUNTER — Other Ambulatory Visit: Payer: Self-pay

## 2021-03-07 DIAGNOSIS — Z5111 Encounter for antineoplastic chemotherapy: Secondary | ICD-10-CM | POA: Diagnosis not present

## 2021-03-07 NOTE — Telephone Encounter (Signed)
Pre cert for itamar faxed to [facility] [phone]

## 2021-03-08 ENCOUNTER — Ambulatory Visit
Admission: RE | Admit: 2021-03-08 | Discharge: 2021-03-08 | Disposition: A | Discharge status: Home / Self Care | Payer: No Typology Code available for payment source | Source: Ambulatory Visit | Attending: Radiation Oncology | Admitting: Radiation Oncology

## 2021-03-08 ENCOUNTER — Other Ambulatory Visit (HOSPITAL_COMMUNITY): Payer: Self-pay

## 2021-03-08 ENCOUNTER — Encounter: Payer: Self-pay | Admitting: Hematology and Oncology

## 2021-03-08 DIAGNOSIS — Z5111 Encounter for antineoplastic chemotherapy: Secondary | ICD-10-CM | POA: Diagnosis not present

## 2021-03-08 MED FILL — Hydrochlorothiazide Tab 25 MG: ORAL | 90 days supply | Qty: 90 | Fill #1 | Status: AC

## 2021-03-09 ENCOUNTER — Other Ambulatory Visit: Payer: Self-pay

## 2021-03-09 ENCOUNTER — Other Ambulatory Visit (HOSPITAL_COMMUNITY): Payer: Self-pay

## 2021-03-09 ENCOUNTER — Ambulatory Visit
Admission: RE | Admit: 2021-03-09 | Discharge: 2021-03-09 | Disposition: A | Discharge status: Home / Self Care | Payer: No Typology Code available for payment source | Source: Ambulatory Visit | Attending: Radiation Oncology | Admitting: Radiation Oncology

## 2021-03-09 DIAGNOSIS — Z5111 Encounter for antineoplastic chemotherapy: Secondary | ICD-10-CM | POA: Diagnosis not present

## 2021-03-10 ENCOUNTER — Other Ambulatory Visit (HOSPITAL_COMMUNITY): Payer: Self-pay

## 2021-03-12 ENCOUNTER — Ambulatory Visit
Admission: RE | Admit: 2021-03-12 | Discharge: 2021-03-12 | Disposition: A | Discharge status: Home / Self Care | Payer: No Typology Code available for payment source | Source: Ambulatory Visit | Attending: Radiation Oncology | Admitting: Radiation Oncology

## 2021-03-12 ENCOUNTER — Other Ambulatory Visit (HOSPITAL_COMMUNITY): Payer: Self-pay

## 2021-03-12 ENCOUNTER — Other Ambulatory Visit: Payer: Self-pay

## 2021-03-12 DIAGNOSIS — Z5111 Encounter for antineoplastic chemotherapy: Secondary | ICD-10-CM | POA: Diagnosis not present

## 2021-03-12 MED ORDER — ALPRAZOLAM 0.25 MG PO TABS
ORAL_TABLET | ORAL | 3 refills | Status: AC
  Filled 2021-03-12: qty 30, 30d supply, fill #0

## 2021-03-13 ENCOUNTER — Ambulatory Visit
Admission: RE | Admit: 2021-03-13 | Discharge: 2021-03-13 | Disposition: A | Discharge status: Home / Self Care | Payer: No Typology Code available for payment source | Source: Ambulatory Visit | Attending: Radiation Oncology | Admitting: Radiation Oncology

## 2021-03-13 DIAGNOSIS — Z5111 Encounter for antineoplastic chemotherapy: Secondary | ICD-10-CM | POA: Diagnosis not present

## 2021-03-14 ENCOUNTER — Ambulatory Visit
Admission: RE | Admit: 2021-03-14 | Discharge: 2021-03-14 | Disposition: A | Discharge status: Home / Self Care | Payer: No Typology Code available for payment source | Source: Ambulatory Visit | Attending: Radiation Oncology | Admitting: Radiation Oncology

## 2021-03-14 ENCOUNTER — Other Ambulatory Visit: Payer: Self-pay

## 2021-03-14 DIAGNOSIS — Z5111 Encounter for antineoplastic chemotherapy: Secondary | ICD-10-CM | POA: Diagnosis not present

## 2021-03-15 ENCOUNTER — Ambulatory Visit
Admission: RE | Admit: 2021-03-15 | Discharge: 2021-03-15 | Disposition: A | Discharge status: Home / Self Care | Payer: No Typology Code available for payment source | Source: Ambulatory Visit | Attending: Radiation Oncology | Admitting: Radiation Oncology

## 2021-03-15 DIAGNOSIS — Z5111 Encounter for antineoplastic chemotherapy: Secondary | ICD-10-CM | POA: Diagnosis not present

## 2021-03-16 ENCOUNTER — Other Ambulatory Visit: Payer: Self-pay

## 2021-03-16 ENCOUNTER — Ambulatory Visit
Admission: RE | Admit: 2021-03-16 | Discharge: 2021-03-16 | Disposition: A | Discharge status: Home / Self Care | Payer: No Typology Code available for payment source | Source: Ambulatory Visit | Attending: Radiation Oncology | Admitting: Radiation Oncology

## 2021-03-16 ENCOUNTER — Other Ambulatory Visit: Payer: No Typology Code available for payment source

## 2021-03-16 ENCOUNTER — Inpatient Hospital Stay: Payer: No Typology Code available for payment source | Attending: Hematology and Oncology

## 2021-03-16 VITALS — BP 111/80 | HR 81 | Temp 98.6°F | Resp 18 | Ht 66.0 in | Wt 259.0 lb

## 2021-03-16 DIAGNOSIS — Z170 Estrogen receptor positive status [ER+]: Secondary | ICD-10-CM | POA: Insufficient documentation

## 2021-03-16 DIAGNOSIS — Z5111 Encounter for antineoplastic chemotherapy: Secondary | ICD-10-CM | POA: Diagnosis not present

## 2021-03-16 DIAGNOSIS — C50211 Malignant neoplasm of upper-inner quadrant of right female breast: Secondary | ICD-10-CM | POA: Insufficient documentation

## 2021-03-16 DIAGNOSIS — Z510 Encounter for antineoplastic radiation therapy: Secondary | ICD-10-CM | POA: Insufficient documentation

## 2021-03-16 MED ORDER — TRASTUZUMAB-DKST CHEMO 150 MG IV SOLR
6.0000 mg/kg | Freq: Once | INTRAVENOUS | Status: AC
  Administered 2021-03-16: 693 mg via INTRAVENOUS
  Filled 2021-03-16: qty 33

## 2021-03-16 MED ORDER — ACETAMINOPHEN 325 MG PO TABS
ORAL_TABLET | ORAL | Status: AC
  Filled 2021-03-16: qty 2

## 2021-03-16 MED ORDER — ACETAMINOPHEN 325 MG PO TABS
ORAL_TABLET | ORAL | Status: AC
  Filled 2021-03-16: qty 1

## 2021-03-16 MED ORDER — DIPHENHYDRAMINE HCL 25 MG PO CAPS
50.0000 mg | ORAL_CAPSULE | Freq: Once | ORAL | Status: AC
  Administered 2021-03-16: 50 mg via ORAL

## 2021-03-16 MED ORDER — SODIUM CHLORIDE 0.9 % IV SOLN: Freq: Once | INTRAVENOUS | Status: AC

## 2021-03-16 MED ORDER — HEPARIN SOD (PORK) LOCK FLUSH 100 UNIT/ML IV SOLN
500.0000 [IU] | Freq: Once | INTRAVENOUS | Status: AC | PRN
  Administered 2021-03-16: 500 [IU]

## 2021-03-16 MED ORDER — SODIUM CHLORIDE 0.9% FLUSH
10.0000 mL | INTRAVENOUS | Status: DC | PRN
  Administered 2021-03-16: 10 mL

## 2021-03-16 MED ORDER — DIPHENHYDRAMINE HCL 25 MG PO CAPS
ORAL_CAPSULE | ORAL | Status: AC
  Filled 2021-03-16: qty 2

## 2021-03-16 MED ORDER — ACETAMINOPHEN 325 MG PO TABS
650.0000 mg | ORAL_TABLET | Freq: Once | ORAL | Status: AC
  Administered 2021-03-16: 650 mg via ORAL

## 2021-03-16 NOTE — Patient Instructions (Signed)
[facility] ONCOLOGY  Discharge Instructions: Thank you for choosing [facility] to provide your oncology and hematology care.   If you have a lab appointment with the [facility], please go directly to the [facility] and check in at the registration area.   Wear comfortable clothing and clothing appropriate for easy access to any Portacath or PICC line.   We strive to give you quality time with your provider. You may need to reschedule your appointment if you arrive late (15 or more minutes).  Arriving late affects you and other patients whose appointments are after yours.  Also, if you miss three or more appointments without notifying the office, you may be dismissed from the clinic at the provider's discretion.      For prescription refill requests, have your pharmacy contact our office and allow 72 hours for refills to be completed.    Today you received the following chemotherapy and/or immunotherapy agents Trastuzumab-dkst (Ogivri)      To help prevent nausea and vomiting after your treatment, we encourage you to take your nausea medication as directed.  BELOW ARE SYMPTOMS THAT SHOULD BE REPORTED IMMEDIATELY: *FEVER GREATER THAN 100.4 F (38 C) OR HIGHER *CHILLS OR SWEATING *NAUSEA AND VOMITING THAT IS NOT CONTROLLED WITH YOUR NAUSEA MEDICATION *UNUSUAL SHORTNESS OF BREATH *UNUSUAL BRUISING OR BLEEDING *URINARY PROBLEMS (pain or burning when urinating, or frequent urination) *BOWEL PROBLEMS (unusual diarrhea, constipation, pain near the anus) TENDERNESS IN MOUTH AND THROAT WITH OR WITHOUT PRESENCE OF ULCERS (sore throat, sores in mouth, or a toothache) UNUSUAL RASH, SWELLING OR PAIN  UNUSUAL VAGINAL DISCHARGE OR ITCHING   Items with * indicate a potential emergency and should be followed up as soon as possible or go to the Emergency Department if any problems should occur.  Please show the CHEMOTHERAPY ALERT CARD or IMMUNOTHERAPY ALERT CARD  at check-in to the Emergency Department and triage nurse.  Should you have questions after your visit or need to cancel or reschedule your appointment, please contact [facility]  Dept: [phone]  and follow the prompts.  Office hours are 8:00 a.m. to 4:30 p.m. Monday - Friday. Please note that voicemails left after 4:00 p.m. may not be returned until the following business day.  We are closed weekends and major holidays. You have access to a nurse at all times for urgent questions. Please call the main number to the clinic Dept: [phone] and follow the prompts.   For any non-urgent questions, you may also contact your provider using MyChart. We now offer e-Visits for anyone 67 and older to request care online for non-urgent symptoms. For details visit [email or web address].   Also download the MyChart app! Go to the app store, search "MyChart", open the app, select [facility], and log in with your MyChart username and password.  Due to Covid, a mask is required upon entering the hospital/clinic. If you do not have a mask, one will be given to you upon arrival. For doctor visits, patients may have 1 support person aged 48 or older with them. For treatment visits, patients cannot have anyone with them due to current Covid guidelines and our immunocompromised population.

## 2021-03-19 ENCOUNTER — Ambulatory Visit: Payer: No Typology Code available for payment source | Attending: General Surgery

## 2021-03-19 ENCOUNTER — Other Ambulatory Visit: Payer: Self-pay

## 2021-03-19 ENCOUNTER — Ambulatory Visit
Admission: RE | Admit: 2021-03-19 | Discharge: 2021-03-19 | Disposition: A | Discharge status: Home / Self Care | Payer: No Typology Code available for payment source | Source: Ambulatory Visit | Attending: Radiation Oncology | Admitting: Radiation Oncology

## 2021-03-19 VITALS — Wt 257.5 lb

## 2021-03-19 DIAGNOSIS — Z483 Aftercare following surgery for neoplasm: Secondary | ICD-10-CM | POA: Insufficient documentation

## 2021-03-19 DIAGNOSIS — Z5111 Encounter for antineoplastic chemotherapy: Secondary | ICD-10-CM | POA: Diagnosis not present

## 2021-03-19 NOTE — Therapy (Signed)
[place]ace [place], [place], [address] Phone: [phone]   Fax:  [phone]  Physical Therapy Treatment  Patient Details  Name: Grace Pennington MRN: [id number] Date of Birth: [birth year] Referring Provider (PT): Joaquin Courts Date: 03/19/2021   PT End of Session - 03/19/21 1613     Visit Number 4   # unchanged due to screen only   PT Start Time 1962    PT Stop Time 1614    PT Time Calculation (min) 20 min    Activity Tolerance Patient tolerated treatment well    Behavior During Therapy WFL for tasks assessed/performed             Past Medical History:  Diagnosis Date   Anxiety    Breast cancer in female [facility])    Right   Depression    GERD (gastroesophageal reflux disease)    Hypertension    Palpitations    Sleep apnea     Past Surgical History:  Procedure Laterality Date   BREAST LUMPECTOMY WITH RADIOACTIVE SEED AND SENTINEL LYMPH NODE BIOPSY Right 10/16/2020   Procedure: RIGHT BREAST LUMPECTOMY WITH RADIOACTIVE SEED AND SENTINEL LYMPH NODE BIOPSY;  Surgeon: [name], MD;  Location: [facility];  Service: General;  Laterality: Right;   PORTACATH PLACEMENT Left 10/16/2020   Procedure: INSERTION PORT-A-CATH;  Surgeon: [name], MD;  Location: [facility];  Service: General;  Laterality: Left;   TUBAL LIGATION      Vitals:   03/19/21 1556  Weight: 257 lb 8 oz (116.8 kg)     Subjective Assessment - 03/19/21 1557     Subjective Pt returns for her 3 month L-Dex screen.    Pertinent History Rtlumpectomy and SLNB on 10/16/20 with Dr. [name] 0/4 nodes positive due to ER+ breast cancer with chemotherapy and radiation to follow.  HTN                    L-DEX FLOWSHEETS - 03/19/21 1500       L-DEX LYMPHEDEMA SCREENING   Measurement Type Unilateral    L-DEX MEASUREMENT EXTREMITY Upper Extremity    POSITION  Standing    DOMINANT SIDE Right    At Risk Side Right    BASELINE SCORE  (UNILATERAL) 1    L-DEX SCORE (UNILATERAL) 10.3    VALUE CHANGE (UNILAT) 9.3                                    PT Long Term Goals - 11/21/20 1227       PT LONG TERM GOAL #1   Title Pt will return to baseline AROM without pull due to cording    Baseline pt has returned to baseline AROM but has pull with overhead flexion due to cording; goal met as pt does not feel limited by pull of cording after todays session - 11/21/20    Status Achieved      PT LONG TERM GOAL #2   Title Pt will be ind with HEP for continued UE strength and mobility throughout radiation and after    Baseline Pt is independent with HEP for flexibility but did not wish to cont PT so did not get to progress to strengthening - 11/21/20    Status Partially Met                   Plan -  03/19/21 1614     Clinical Impression Statement Pt returns for her 3 month L-Dex screen. Her change from baseline of 9.3 is not WNLs indicating subclinical lymphedema, so pt was measured for a compresison sleeve today and was issued a size VI, color black Mediven HArmony. This fit reasonably well however does not have the silicone dots at the upper most aspect of the sleeve and due to the shape of her arm, this was rolling down. So will have her get measured for another compression sleeve with silcone dots at [facility] at her earliest convienence. She was educated to wear this 10 hrs/day for next month and scheudled her to return for SOZO screen at that time. Pt verbalizes good understanding of all, and also demonstrates being mindful of fixing sleeve when this rolls down her upper arm.    PT Next Visit Plan Pt to return for a SOZO screen in 1 month for reassess of subclinical lymphedema indicated by screen today.    Consulted and Agree with Plan of Care Patient             Patient will benefit from skilled therapeutic intervention in order to improve the following deficits and impairments:      Visit Diagnosis: Aftercare following surgery for neoplasm     Problem List Patient Active Problem List   Diagnosis Date Noted   Port-A-Cath in place 11/20/2020   Malignant neoplasm of upper-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, estrogen receptor positive ([facility]) 09/20/2020   Precordial pain 04/14/2014   Essential hypertension, benign 08/03/2013   Family history of ischemic heart disease 08/03/2013    [name], PTA 03/19/2021, 4:19 PM  [place], [place], [address] Phone: [phone]   Fax:  [phone]  Name: Grace Pennington MRN: [id number] Date of Birth: 07/07/1971

## 2021-03-20 ENCOUNTER — Ambulatory Visit: Payer: No Typology Code available for payment source | Admitting: Dietician

## 2021-03-20 ENCOUNTER — Ambulatory Visit
Admission: RE | Admit: 2021-03-20 | Discharge: 2021-03-20 | Disposition: A | Discharge status: Home / Self Care | Payer: No Typology Code available for payment source | Source: Ambulatory Visit | Attending: Radiation Oncology | Admitting: Radiation Oncology

## 2021-03-20 DIAGNOSIS — Z5111 Encounter for antineoplastic chemotherapy: Secondary | ICD-10-CM | POA: Diagnosis not present

## 2021-03-21 ENCOUNTER — Ambulatory Visit
Admission: RE | Admit: 2021-03-21 | Discharge: 2021-03-21 | Disposition: A | Discharge status: Home / Self Care | Payer: No Typology Code available for payment source | Source: Ambulatory Visit | Attending: Radiation Oncology | Admitting: Radiation Oncology

## 2021-03-21 ENCOUNTER — Other Ambulatory Visit: Payer: Self-pay

## 2021-03-21 ENCOUNTER — Other Ambulatory Visit (HOSPITAL_COMMUNITY): Payer: Self-pay

## 2021-03-21 DIAGNOSIS — Z5111 Encounter for antineoplastic chemotherapy: Secondary | ICD-10-CM | POA: Diagnosis not present

## 2021-03-21 MED FILL — Citalopram Hydrobromide Tab 10 MG (Base Equiv): ORAL | 90 days supply | Qty: 90 | Fill #1 | Status: AC

## 2021-03-22 ENCOUNTER — Encounter: Payer: Self-pay | Admitting: Radiation Oncology

## 2021-03-22 ENCOUNTER — Ambulatory Visit
Admission: RE | Admit: 2021-03-22 | Discharge: 2021-03-22 | Disposition: A | Discharge status: Home / Self Care | Payer: No Typology Code available for payment source | Source: Ambulatory Visit | Attending: Radiation Oncology | Admitting: Radiation Oncology

## 2021-03-22 DIAGNOSIS — Z5111 Encounter for antineoplastic chemotherapy: Secondary | ICD-10-CM | POA: Diagnosis not present

## 2021-03-23 ENCOUNTER — Ambulatory Visit
Admission: RE | Admit: 2021-03-23 | Discharge: 2021-03-23 | Disposition: A | Discharge status: Home / Self Care | Payer: No Typology Code available for payment source | Source: Ambulatory Visit | Attending: Radiation Oncology | Admitting: Radiation Oncology

## 2021-03-23 ENCOUNTER — Other Ambulatory Visit: Payer: Self-pay

## 2021-03-23 ENCOUNTER — Ambulatory Visit: Payer: No Typology Code available for payment source | Admitting: Radiation Oncology

## 2021-03-23 DIAGNOSIS — Z5111 Encounter for antineoplastic chemotherapy: Secondary | ICD-10-CM | POA: Diagnosis not present

## 2021-03-26 ENCOUNTER — Ambulatory Visit
Admission: RE | Admit: 2021-03-26 | Discharge: 2021-03-26 | Disposition: A | Discharge status: Home / Self Care | Payer: No Typology Code available for payment source | Source: Ambulatory Visit | Attending: Radiation Oncology | Admitting: Radiation Oncology

## 2021-03-26 ENCOUNTER — Other Ambulatory Visit: Payer: Self-pay

## 2021-03-26 DIAGNOSIS — Z5111 Encounter for antineoplastic chemotherapy: Secondary | ICD-10-CM | POA: Diagnosis not present

## 2021-03-27 ENCOUNTER — Ambulatory Visit
Admission: RE | Admit: 2021-03-27 | Discharge: 2021-03-27 | Disposition: A | Discharge status: Home / Self Care | Payer: No Typology Code available for payment source | Source: Ambulatory Visit | Attending: Radiation Oncology | Admitting: Radiation Oncology

## 2021-03-27 ENCOUNTER — Ambulatory Visit: Payer: No Typology Code available for payment source

## 2021-03-27 DIAGNOSIS — Z5111 Encounter for antineoplastic chemotherapy: Secondary | ICD-10-CM | POA: Diagnosis not present

## 2021-03-28 ENCOUNTER — Ambulatory Visit: Payer: No Typology Code available for payment source

## 2021-03-28 ENCOUNTER — Ambulatory Visit
Admission: RE | Admit: 2021-03-28 | Discharge: 2021-03-28 | Disposition: A | Discharge status: Home / Self Care | Payer: No Typology Code available for payment source | Source: Ambulatory Visit | Attending: Radiation Oncology | Admitting: Radiation Oncology

## 2021-03-28 ENCOUNTER — Other Ambulatory Visit: Payer: Self-pay

## 2021-03-28 DIAGNOSIS — Z5111 Encounter for antineoplastic chemotherapy: Secondary | ICD-10-CM | POA: Diagnosis not present

## 2021-03-29 ENCOUNTER — Ambulatory Visit
Admission: RE | Admit: 2021-03-29 | Discharge: 2021-03-29 | Disposition: A | Discharge status: Home / Self Care | Payer: No Typology Code available for payment source | Source: Ambulatory Visit | Attending: Radiation Oncology | Admitting: Radiation Oncology

## 2021-03-29 DIAGNOSIS — Z170 Estrogen receptor positive status [ER+]: Secondary | ICD-10-CM | POA: Diagnosis present

## 2021-03-29 DIAGNOSIS — C50211 Malignant neoplasm of upper-inner quadrant of right female breast: Secondary | ICD-10-CM | POA: Insufficient documentation

## 2021-03-29 MED ORDER — SILVER SULFADIAZINE 1 % EX CREA: TOPICAL_CREAM | Freq: Two times a day (BID) | CUTANEOUS | Status: DC

## 2021-03-30 ENCOUNTER — Telehealth (HOSPITAL_COMMUNITY): Payer: Self-pay | Admitting: Internal Medicine

## 2021-03-30 ENCOUNTER — Other Ambulatory Visit: Payer: Self-pay

## 2021-03-30 ENCOUNTER — Ambulatory Visit
Admission: RE | Admit: 2021-03-30 | Discharge: 2021-03-30 | Disposition: A | Discharge status: Home / Self Care | Payer: No Typology Code available for payment source | Source: Ambulatory Visit | Attending: Radiation Oncology | Admitting: Radiation Oncology

## 2021-03-30 DIAGNOSIS — C50211 Malignant neoplasm of upper-inner quadrant of right female breast: Secondary | ICD-10-CM | POA: Diagnosis not present

## 2021-03-30 NOTE — Telephone Encounter (Signed)
Pt called to f/u w/sleep study, please advise

## 2021-03-30 NOTE — Telephone Encounter (Signed)
Auth still in clinical review

## 2021-04-03 ENCOUNTER — Ambulatory Visit (HOSPITAL_BASED_OUTPATIENT_CLINIC_OR_DEPARTMENT_OTHER)
Admission: RE | Admit: 2021-04-03 | Discharge: 2021-04-03 | Disposition: A | Discharge status: Home / Self Care | Payer: No Typology Code available for payment source | Source: Ambulatory Visit | Attending: Internal Medicine | Admitting: Internal Medicine

## 2021-04-03 ENCOUNTER — Encounter: Payer: Self-pay | Admitting: *Deleted

## 2021-04-03 ENCOUNTER — Ambulatory Visit: Payer: No Typology Code available for payment source

## 2021-04-03 ENCOUNTER — Ambulatory Visit (HOSPITAL_COMMUNITY)
Admission: RE | Admit: 2021-04-03 | Discharge: 2021-04-03 | Disposition: A | Discharge status: Home / Self Care | Payer: No Typology Code available for payment source | Source: Ambulatory Visit | Attending: Internal Medicine | Admitting: Internal Medicine

## 2021-04-03 ENCOUNTER — Other Ambulatory Visit: Payer: Self-pay

## 2021-04-03 ENCOUNTER — Other Ambulatory Visit (HOSPITAL_COMMUNITY): Payer: Self-pay

## 2021-04-03 ENCOUNTER — Telehealth: Payer: Self-pay | Admitting: Hematology and Oncology

## 2021-04-03 ENCOUNTER — Ambulatory Visit
Admission: RE | Admit: 2021-04-03 | Discharge: 2021-04-03 | Disposition: A | Discharge status: Home / Self Care | Payer: No Typology Code available for payment source | Source: Ambulatory Visit | Attending: Radiation Oncology | Admitting: Radiation Oncology

## 2021-04-03 VITALS — BP 112/78 | HR 77 | Wt 258.4 lb

## 2021-04-03 DIAGNOSIS — Z79899 Other long term (current) drug therapy: Secondary | ICD-10-CM | POA: Diagnosis not present

## 2021-04-03 DIAGNOSIS — Z0189 Encounter for other specified special examinations: Secondary | ICD-10-CM | POA: Diagnosis not present

## 2021-04-03 DIAGNOSIS — T451X5A Adverse effect of antineoplastic and immunosuppressive drugs, initial encounter: Secondary | ICD-10-CM | POA: Diagnosis not present

## 2021-04-03 DIAGNOSIS — I10 Essential (primary) hypertension: Secondary | ICD-10-CM | POA: Insufficient documentation

## 2021-04-03 DIAGNOSIS — Z87891 Personal history of nicotine dependence: Secondary | ICD-10-CM | POA: Diagnosis not present

## 2021-04-03 DIAGNOSIS — Z170 Estrogen receptor positive status [ER+]: Secondary | ICD-10-CM | POA: Diagnosis not present

## 2021-04-03 DIAGNOSIS — C50211 Malignant neoplasm of upper-inner quadrant of right female breast: Secondary | ICD-10-CM | POA: Diagnosis not present

## 2021-04-03 DIAGNOSIS — G4730 Sleep apnea, unspecified: Secondary | ICD-10-CM | POA: Diagnosis not present

## 2021-04-03 DIAGNOSIS — I427 Cardiomyopathy due to drug and external agent: Secondary | ICD-10-CM

## 2021-04-03 LAB — ECHOCARDIOGRAM COMPLETE
AR max vel: 3.22 cm2
AV Area VTI: 3.01 cm2
AV Area mean vel: 3.31 cm2
AV Mean grad: 2 mmHg
AV Peak grad: 3.8 mmHg
Ao pk vel: 0.97 m/s
Area-P 1/2: 3.85 cm2
Calc EF: 44.3 %
S' Lateral: 2.8 cm
Single Plane A2C EF: 45.1 %
Single Plane A4C EF: 43.2 %

## 2021-04-03 MED ORDER — CARVEDILOL 3.125 MG PO TABS
3.1250 mg | ORAL_TABLET | Freq: Two times a day (BID) | ORAL | 3 refills | Status: DC
  Filled 2021-04-03: qty 60, 30d supply, fill #0
  Filled 2021-05-29: qty 60, 30d supply, fill #1
  Filled 2021-07-29: qty 60, 30d supply, fill #2
  Filled 2021-09-29: qty 60, 30d supply, fill #3

## 2021-04-03 NOTE — Progress Notes (Signed)
  Echocardiogram 2D Echocardiogram has been performed.  Grace Pennington F 04/03/2021, 9:13 AM

## 2021-04-03 NOTE — Progress Notes (Signed)
Per MD request pt 04/06/21 apt needing to be pushed out by 6 weeks per Dr. Clayborne Dana request.  High priority scheduling message sent.

## 2021-04-03 NOTE — Patient Instructions (Signed)
Start Carvedilol 3.125 mg Twice daily   Your physician recommends that you schedule a follow-up appointment in: 6 weeks with an echocardiogram  If you have any questions or concerns before your next appointment please send [place] a message through [facility] or call our office at [phone].    TO LEAVE A MESSAGE FOR THE NURSE SELECT OPTION 2, PLEASE LEAVE A MESSAGE INCLUDING: YOUR NAME DATE OF BIRTH CALL BACK NUMBER REASON FOR CALL**this is important as we prioritize the call backs  YOU WILL RECEIVE A CALL BACK THE SAME DAY AS LONG AS YOU CALL BEFORE 4:00 PM  At the [facility], you and your health needs are our priority. As part of our continuing mission to provide you with exceptional heart care, we have created designated Provider Care Teams. These Care Teams include your primary Cardiologist (physician) and Advanced Practice Providers (APPs- Physician Assistants and Nurse Practitioners) who all work together to provide you with the care you need, when you need it.   You may see any of the following providers on your designated Care Team at your next follow up: Dr Glori Bickers Dr Loralie Champagne Dr Patrice Paradise, NP Lyda Jester, [place] Ginnie Smart Audry Riles, PharmD   Please be sure to bring in all your medications bottles to every appointment.

## 2021-04-03 NOTE — Progress Notes (Signed)
CARDIO-ONCOLOGY CLINIC NOTE  Referring Physician:Gudena, Vinay Primary Care:Polite,  Jori Moll Primary Cardiologist:Maya Scholer, Quillian Quince  HPI:  Grace Pennington is 50 y.o. female with HTN, Depression, Anxiety,  Breast Cancer referred by Dr. Nicholas Lose for enrollment into the Cardio-Oncology program.  09/08/2020: Stage IA (cT1b, cN0, cM0, G2, ER+, PR+, HER2+).  10/16/20 right lumpectomy high grade DCIS, clear margins, 4 right axillary lymph nodes negative for carcinoma.  Started on BREAST PACLITAXEL + TRASTUZUMAB Q7D / TRASTUZUMAB Q21D which she recently finished. She will start on Herceptin maintenance for 1 year plan to start 02/2021.  Adjuvant radiation started 01/2021.  Also will be started on antiestrogen therapy.    10/2020 ECHO EF 60-65%, GLS -24.8% 01/2021 ECHO EF 50-55% abnormal septal motion, GLS -18.9% partially brought down by apical 3C view, ? poor visualization of lateral wall.  Maybe subtle decrease in EF/increase in GLS but not clinically significant 04/03/2021 ECHO ECHO EF 45%, GLS ~ -13% mild worsening in EF   Last visit doing well, some mild dyspnea just after treatments but still able to walk 4 miles.  Given subtle decrease in EF/increase in GLS on last echo and the fact she was doing so well clinically we opted to continue herceptin therapy and start patient on losartan 56m daily and ordered a sleep study which is still pending.  Here today for FU.  Feeling well since last visit.  She has completed 1 more herceptin treatment 03/16/21 since last visit.  Still undergoing radiation has two more treatments today and tomorrow.  Working from home now and school has restarted so she is less active but still trying to walk about 30 minutes 3-4 times per week.  Denies any dyspnea on exertion, chest pain, orthopnea. Does well walking up and down stairs.       Past Medical History:  Diagnosis Date   Anxiety    Breast cancer in female ([facility]    Right   Depression    GERD (gastroesophageal reflux  disease)    Hypertension    Palpitations    Sleep apnea     Current Outpatient Medications  Medication Sig Dispense Refill   ALPRAZolam (XANAX) 0.25 MG tablet Take 1 tablet by mouth once a day if needed 30 tablet 3   citalopram (CELEXA) 10 MG tablet TAKE 1 TABLET BY MOUTH ONCE DAILY 90 tablet 3   fluticasone (FLONASE) 50 MCG/ACT nasal spray Place 2 sprays into both nostrils daily as needed for allergies.     hydrochlorothiazide (HYDRODIURIL) 25 MG tablet TAKE 1 TABLET BY MOUTH ONCE DAILY 90 tablet 3   hydroxypropyl methylcellulose / hypromellose (ISOPTO TEARS / GONIOVISC) 2.5 % ophthalmic solution Place 1 drop into both eyes 3 (three) times daily as needed (dry/irritated eyes).     lidocaine-prilocaine (EMLA) cream APPLY TO AFFECTED AREA ONCE 30 g 3   loratadine (CLARITIN) 10 MG tablet Take 10 mg by mouth daily as needed for allergies.     losartan (COZAAR) 25 MG tablet Take 1 tablet (25 mg total) by mouth daily. 90 tablet 2   metFORMIN (GLUCOPHAGE) 1000 MG tablet Take 1,000 mg by mouth 2 (two) times daily with a meal.     Multiple Vitamins-Minerals (MULTIVITAMIN WITH MINERALS) tablet Take 1 tablet by mouth daily.     pantoprazole (PROTONIX) 40 MG tablet TAKE 1 TABLET BY MOUTH DAILY 90 tablet 1   rosuvastatin (CRESTOR) 10 MG tablet TAKE 1 TABLET BY MOUTH ONCE A DAY 90 tablet 3   spironolactone (ALDACTONE) 50 MG tablet Take  50 mg by mouth daily.     No current facility-administered medications for this encounter.    No Known Allergies    Social History   Socioeconomic History   Marital status: Single    Spouse name: Not on file   Number of children: 2   Years of education: Not on file   Highest education level: Not on file  Occupational History   Occupation: medical records    Employer: [employer]  Tobacco Use   Smoking status: Former    Types: Cigarettes    Quit date: 07/29/1996    Years since quitting: 24.6   Smokeless tobacco: Never  Vaping Use   Vaping Use: Never used   Substance and Sexual Activity   Alcohol use: Yes    Comment: socially   Drug use: No   Sexual activity: Yes    Birth control/protection: Surgical  Other Topics Concern   Not on file  Social History Narrative   Not on file   Social Determinants of Health   Financial Resource Strain: Not on file  Food Insecurity: Not on file  Transportation Needs: Not on file  Physical Activity: Not on file  Stress: Not on file  Social Connections: Not on file  Intimate Partner Violence: Not At Risk   Fear of Current or Ex-Partner: No   Emotionally Abused: No   Physically Abused: No   Sexually Abused: No      Family History  Problem Relation Age of Onset   Heart disease Mother    Diabetes Mother    Hyperlipidemia Mother    Hypertension Mother    Kidney failure Mother    Diabetes Other    Stroke Other    Hypertension Other    Kidney failure Other    Coronary artery disease Other    Sleep apnea Father    Diabetes Father    Hyperlipidemia Father    Hypertension Father     Vitals:   04/03/21 0909  BP: 112/78  Pulse: 77  SpO2: 98%  Weight: 117.2 kg (258 lb 6 oz)    PHYSICAL EXAM: General:  Well appearing. No respiratory difficulty HEENT: normal Neck: supple. no JVD. Carotids 2+ bilat; no bruits. No lymphadenopathy or thryomegaly appreciated. Cor: PMI nondisplaced. Regular rate & rhythm. No rubs, gallops or murmurs. Lungs: clear Abdomen: soft, nontender, nondistended. No hepatosplenomegaly. No bruits or masses. Good bowel sounds. Extremities: no cyanosis, clubbing, rash, edema Neuro: alert & oriented x 3, cranial nerves grossly intact. moves all 4 extremities w/o difficulty. Affect pleasant.   ASSESSMENT & PLAN: 1. 08/2020 diagnosed with Stage IA (cT1b, cN0, cM0, G2, ER+, PR+, HER2+) R Breast Cancer -10/2020 ECHO EF 60-65%, GLS -24.8% -01/2021 ECHO EF 50-55% abnormal septal motion, GLS -18.9% partially brought down by apical 3C view, ? poor visualization of lateral wall.   Maybe subtle decrease in EF/increase in GLS but not clinically significant -04/03/2021 ECHO EF 45%, GLS ~ -13% mild worsening in EF  -02/2021 reviwed 01/2021 ECHO Maybe subtle decrease in EF/increase in GLS but not clinically significant, continued herceptin added losartan 25 daily -Continue Losartan 56m daily, (chronically taking spiro 50 daily prescribed by OBGYN) -Add carvedilol 3.125 BID -Sleep Study still pending -Stop Herceptin (has rec'd about 6 months of maintenance therapy) and repeat ECHO in 6 weeks, assess need and appropriatness of continuing herceptin at that time. Discussed with Dr. GRock Nephew MD  9:38 AM   Patient seen and examined with the above-signed Advanced Practice  Provider and/or Housestaff. I personally reviewed laboratory data, imaging studies and relevant notes. I independently examined the patient and formulated the important aspects of the plan. I have edited the note to reflect any of my changes or salient points. I have personally discussed the plan with the patient and/or family.  Feels fine. Denies SOB, orthopnea, PND or edema. Continues on Herceptin.  Echo today EF 45-50%. (Reduced from previous)  General:  Well appearing. No resp difficulty HEENT: normal Neck: supple. no JVD. Carotids 2+ bilat; no bruits. No lymphadenopathy or thryomegaly appreciated. Cor: PMI nondisplaced. Regular rate & rhythm. No rubs, gallops or murmurs. L port Lungs: clear Abdomen: obese soft, nontender, nondistended. No hepatosplenomegaly. No bruits or masses. Good bowel sounds. Extremities: no cyanosis, clubbing, rash, edema Neuro: alert & orientedx3, cranial nerves grossly intact. moves all 4 extremities w/o difficulty. Affect pleasant  EF continues to decline slowly. This is almost certainly Herceptin cardiotoxicity. Will hold Herceptin for 6 weeks. Add carvedilol. Continue Losartan. Repeat echo in 6 weeks to reassess. D/w Dr. Lindi Adie.  Glori Bickers, MD  11:54  PM

## 2021-04-03 NOTE — Telephone Encounter (Signed)
R/s appts per 9/6 sch msg. Pt aware.

## 2021-04-04 ENCOUNTER — Ambulatory Visit
Admission: RE | Admit: 2021-04-04 | Discharge: 2021-04-04 | Disposition: A | Discharge status: Home / Self Care | Payer: No Typology Code available for payment source | Source: Ambulatory Visit | Attending: Radiation Oncology | Admitting: Radiation Oncology

## 2021-04-04 ENCOUNTER — Encounter: Payer: Self-pay | Admitting: Radiation Oncology

## 2021-04-04 ENCOUNTER — Telehealth: Payer: Self-pay | Admitting: Hematology and Oncology

## 2021-04-04 ENCOUNTER — Other Ambulatory Visit (HOSPITAL_COMMUNITY): Payer: Self-pay

## 2021-04-04 DIAGNOSIS — C50211 Malignant neoplasm of upper-inner quadrant of right female breast: Secondary | ICD-10-CM | POA: Diagnosis not present

## 2021-04-04 MED FILL — Rosuvastatin Calcium Tab 10 MG: ORAL | 90 days supply | Qty: 90 | Fill #1 | Status: AC

## 2021-04-04 NOTE — Telephone Encounter (Signed)
Scheduled per sch msg. Called and spoke with patient. Confirmed appts  

## 2021-04-05 ENCOUNTER — Encounter: Payer: Self-pay | Admitting: *Deleted

## 2021-04-06 ENCOUNTER — Inpatient Hospital Stay: Payer: No Typology Code available for payment source

## 2021-04-06 ENCOUNTER — Inpatient Hospital Stay: Payer: No Typology Code available for payment source | Admitting: Hematology and Oncology

## 2021-04-09 ENCOUNTER — Encounter: Payer: Self-pay | Admitting: Hematology and Oncology

## 2021-04-09 NOTE — Progress Notes (Signed)
                                                                                                                                                             Patient Name: Grace Pennington MRN: [id number] DOB: July 15, 1971 Referring Physician: POLITE RONALD (Profile Not Attached) Date of Service: 04/04/2021 [facility], [place]                                                        End Of Treatment Note  Diagnoses: C50.211-Malignant neoplasm of upper-inner quadrant of right female breast  Cancer Staging: Stage IA, pT1bN0M0 grade 3 invasive carcinoma of the right breast.  Intent: Curative  Radiation Treatment Dates: 02/15/2021 through 04/04/2021 Site Technique Total Dose (Gy) Dose per Fx (Gy) Completed Fx Beam Energies  Breast, Right: Breast_Rt 3D 50.4/50.4 1.8 28/28 10X  Breast, Right: Breast_Rt_Bst 3D 10/10 2 5/5 6X, 10X   Narrative: The patient tolerated radiation therapy relatively well. She developed fatigue and anticipated skin changes in the treatment field.   Plan: The patient will receive a call in about one month from the radiation oncology department. She will continue follow up with Dr. Lindi Adie as well.    ________________________________________________    Carola Rhine, [facility]

## 2021-04-12 ENCOUNTER — Encounter: Payer: Self-pay | Admitting: Hematology and Oncology

## 2021-04-12 NOTE — Progress Notes (Signed)
  Radiation Oncology         [phone] ________________________________  Name: Grace Pennington MRN: [id number]  Date: 03/22/2021  DOB: [birth year]  SIMULATION NOTE   NARRATIVE:  The patient underwent simulation today for ongoing radiation therapy.  The existing CT study set was employed for the purpose of virtual treatment planning.  The target and avoidance structures were reviewed and modified as necessary.  Treatment planning then occurred.  The radiation boost prescription was entered and confirmed.  A total of 3 complex treatment devices were fabricated in the form of multi-leaf collimators to shape radiation around the targets while maximally excluding nearby normal structures. I have requested : Isodose Plan.    PLAN:  This modified radiation beam arrangement is intended to continue the current radiation dose to an additional 10 Gy in 5 fractions for a total cumulative dose of 60.4 Gy.    ------------------------------------------------  Jodelle Gross, MD, PhD

## 2021-04-16 ENCOUNTER — Ambulatory Visit: Payer: No Typology Code available for payment source | Attending: General Surgery

## 2021-04-16 ENCOUNTER — Other Ambulatory Visit: Payer: Self-pay | Admitting: *Deleted

## 2021-04-16 ENCOUNTER — Other Ambulatory Visit: Payer: Self-pay

## 2021-04-16 DIAGNOSIS — R293 Abnormal posture: Secondary | ICD-10-CM | POA: Insufficient documentation

## 2021-04-16 DIAGNOSIS — Z170 Estrogen receptor positive status [ER+]: Secondary | ICD-10-CM

## 2021-04-16 DIAGNOSIS — Z483 Aftercare following surgery for neoplasm: Secondary | ICD-10-CM | POA: Insufficient documentation

## 2021-04-16 DIAGNOSIS — I890 Lymphedema, not elsewhere classified: Secondary | ICD-10-CM | POA: Insufficient documentation

## 2021-04-16 DIAGNOSIS — C50211 Malignant neoplasm of upper-inner quadrant of right female breast: Secondary | ICD-10-CM

## 2021-04-16 NOTE — Therapy (Signed)
[place], [place], [address] Phone: [phone]   Fax:  [phone]  Physical Therapy Treatment  Patient Details  Name: CERENITY GOSHORN MRN: [id number] Date of Birth: [birth year] Referring Provider (PT): Joaquin Courts Date: 04/16/2021   PT End of Session - 04/16/21 1232     Visit Number 4    PT Start Time 1200    PT Stop Time 1115    PT Time Calculation (min) 15 min    Activity Tolerance Patient tolerated treatment well    Behavior During Therapy WFL for tasks assessed/performed             Past Medical History:  Diagnosis Date   Anxiety    Breast cancer in female [facility])    Right   Depression    GERD (gastroesophageal reflux disease)    Hypertension    Palpitations    Sleep apnea     Past Surgical History:  Procedure Laterality Date   BREAST LUMPECTOMY WITH RADIOACTIVE SEED AND SENTINEL LYMPH NODE BIOPSY Right 10/16/2020   Procedure: RIGHT BREAST LUMPECTOMY WITH RADIOACTIVE SEED AND SENTINEL LYMPH NODE BIOPSY;  Surgeon: [name], MD;  Location: [facility];  Service: General;  Laterality: Right;   PORTACATH PLACEMENT Left 10/16/2020   Procedure: INSERTION PORT-A-CATH;  Surgeon: [name], MD;  Location: [place] [facility];  Service: General;  Laterality: Left;   TUBAL LIGATION      There were no vitals filed for this visit.   Subjective Assessment - 04/16/21 1231     Subjective Pt here for f/iu after elevated SOZO screen score. She has visible edema in right arm.    Pertinent History Rtlumpectomy and SLNB on 10/16/20 with Dr. [name] 0/4 nodes positive due to ER+ breast cancer with chemotherapy and radiation to follow.  HTN                    L-DEX FLOWSHEETS - 04/16/21 1200       L-DEX LYMPHEDEMA SCREENING   Measurement Type Unilateral    L-DEX MEASUREMENT EXTREMITY Upper Extremity    POSITION  Standing    DOMINANT SIDE Right    At Risk Side Right    BASELINE SCORE  (UNILATERAL) 1    L-DEX SCORE (UNILATERAL) 11    VALUE CHANGE (UNILAT) 10                                     PT Long Term Goals - 11/21/20 1227       PT LONG TERM GOAL #1   Title Pt will return to baseline AROM without pull due to cording    Baseline pt has returned to baseline AROM but has pull with overhead flexion due to cording; goal met as pt does not feel limited by pull of cording after todays session - 11/21/20    Status Achieved      PT LONG TERM GOAL #2   Title Pt will be ind with HEP for continued UE strength and mobility throughout radiation and after    Baseline Pt is independent with HEP for flexibility but did not wish to cont PT so did not get to progress to strengthening - 11/21/20    Status Partially Met                   Plan - 04/16/21 1232  Clinical Impression Statement Pt with persistent edema and a 10 point change on SOZO from baseline. Will begin treatment with an evaluation 04/17/2021.    PT Next Visit Plan Eval for lymphedema             Patient will benefit from skilled therapeutic intervention in order to improve the following deficits and impairments:     Visit Diagnosis: Aftercare following surgery for neoplasm  Lymphedema, not elsewhere classified     Problem List Patient Active Problem List   Diagnosis Date Noted   Port-A-Cath in place 11/20/2020   Malignant neoplasm of upper-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, estrogen receptor positive ([place]) 09/20/2020   Precordial pain 04/14/2014   Essential hypertension, benign 08/03/2013   Family history of ischemic heart disease 08/03/2013   Annia Friendly, PT 04/16/21 12:33 PM   [place], [place], [address] Phone: [phone]   Fax:  [phone]  Name: SAPHIRA LAHMANN MRN: [id number] Date of Birth: [birth year]

## 2021-04-17 ENCOUNTER — Ambulatory Visit: Payer: No Typology Code available for payment source | Admitting: Physical Therapy

## 2021-04-17 ENCOUNTER — Encounter: Payer: Self-pay | Admitting: Physical Therapy

## 2021-04-17 DIAGNOSIS — Z483 Aftercare following surgery for neoplasm: Secondary | ICD-10-CM | POA: Diagnosis present

## 2021-04-17 DIAGNOSIS — I890 Lymphedema, not elsewhere classified: Secondary | ICD-10-CM | POA: Diagnosis present

## 2021-04-17 DIAGNOSIS — R293 Abnormal posture: Secondary | ICD-10-CM | POA: Diagnosis present

## 2021-04-17 NOTE — Therapy (Signed)
[place], [place], [address] Phone: [phone]   Fax:  [phone]  Physical Therapy Evaluation  Patient Details  Name: Grace Pennington MRN: [id number] Date of Birth: May 28, 1971 Referring Provider (PT): Lindi Adie   Encounter Date: 04/17/2021   PT End of Session - 04/17/21 0900     Visit Number 1    Number of Visits 9    Date for PT Re-Evaluation 05/15/21    PT Start Time 0806    PT Stop Time 7253    PT Time Calculation (min) 51 min    Activity Tolerance Patient tolerated treatment well    Behavior During Therapy [facility] for tasks assessed/performed             Past Medical History:  Diagnosis Date   Anxiety    Breast cancer in female [facility])    Right   Depression    GERD (gastroesophageal reflux disease)    Hypertension    Palpitations    Sleep apnea     Past Surgical History:  Procedure Laterality Date   BREAST LUMPECTOMY WITH RADIOACTIVE SEED AND SENTINEL LYMPH NODE BIOPSY Right 10/16/2020   Procedure: RIGHT BREAST LUMPECTOMY WITH RADIOACTIVE SEED AND SENTINEL LYMPH NODE BIOPSY;  Surgeon: [name], MD;  Location: [place];  Service: General;  Laterality: Right;   PORTACATH PLACEMENT Left 10/16/2020   Procedure: INSERTION PORT-A-CATH;  Surgeon: [name], MD;  Location: [place];  Service: General;  Laterality: Left;   TUBAL LIGATION      There were no vitals filed for this visit.    Subjective Assessment - 04/17/21 0808     Subjective I started noticing some swelling around my 1st SOZO. I got measured at [facility] but I still have not gotten my sleeve.    Pertinent History Rtlumpectomy and SLNB on 10/16/20 with Dr. [name] 0/4 nodes positive due to ER+ breast cancer with chemotherapy and radiation, completed radiation on 9/7,  HTN    Patient Stated Goals to get the swelling down    Currently in Pain? No/denies    Multiple Pain Sites No                [facility] PT Assessment -  04/17/21 0001       Assessment   Medical Diagnosis breast cancer    Referring Provider (PT) Gudena    Onset Date/Surgical Date 10/16/20    Hand Dominance Right    Prior Therapy PT for ROM following lumpectomy      Precautions   Precautions Other (comment)    Precaution Comments at risk of lymphedema      Restrictions   Weight Bearing Restrictions No      Balance Screen   Has the patient fallen in the past 6 months No    Has the patient had a decrease in activity level because of a fear of falling?  No    Is the patient reluctant to leave their home because of a fear of falling?  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    Leisure pt walks at least 30 minutes a day  Cognition   Overall Cognitive Status Within Functional Limits for tasks assessed      Posture/Postural Control   Posture/Postural Control Postural limitations    Postural Limitations Rounded Shoulders;Forward head      AROM   Overall AROM Comments both shoulders WFL    Right Shoulder Extension --    Right Shoulder Flexion --    Right Shoulder ABduction --    Right Shoulder Internal Rotation --    Right Shoulder External Rotation --               LYMPHEDEMA/ONCOLOGY QUESTIONNAIRE - 04/17/21 0001       Type   Cancer Type right breast cancer      Surgeries   Lumpectomy Date 10/16/20    Sentinel Lymph Node Biopsy Date 10/16/20    Number Lymph Nodes Removed 4      Date Lymphedema/Swelling Started   Date 03/17/21 (P)       Treatment   Active Chemotherapy Treatment Yes (P)     Past Chemotherapy Treatment No (P)     Active Radiation Treatment No (P)     Past Radiation Treatment Yes (P)       What other symptoms do you have   Are  you Having Heaviness or Tightness Yes (P)     Are you having Pain No (P)     Are you having pitting edema No (P)     Is it Hard or Difficult finding clothes that fit No (P)     Do you have infections No (P)     Is there Decreased scar mobility No (P)       Right Upper Extremity Lymphedema   15 cm Proximal to Olecranon Process 43 cm (P)     Olecranon Process 34 cm (P)     15 cm Proximal to Ulnar Styloid Process 33.2 cm (P)     10 cm Proximal to Ulnar Styloid Process 29 cm (P)     Just Proximal to Ulnar Styloid Process 21.8 cm (P)     Across Hand at [employer] 23.2 cm (P)     At [place] of 2nd Digit 7.5 cm (P)       Left Upper Extremity Lymphedema   15 cm Proximal to Olecranon Process 43.2 cm (P)     Olecranon Process 32.1 cm (P)     15 cm Proximal to Ulnar Styloid Process 30.5 cm (P)     10 cm Proximal to Ulnar Styloid Process 27 cm (P)     Just Proximal to Ulnar Styloid Process 22.1 cm (P)     Across Hand at [employer] 22.7 cm (P)     At [place] of 2nd Digit 7.3 cm (P)                      Objective measurements completed on examination: See above findings.       [place] Adult PT Treatment/Exercise - 04/17/21 0001       Manual Therapy   Manual Therapy Manual Lymphatic Drainage (MLD)    Manual Lymphatic Drainage (MLD) in supine: short neck, 5 diaphragmatic breaths, left axillary nodes and establishment of interaxillary pathway, right inguinal nodes and establishment of axillo inguinal pathway, R UE working proximal to distal then retracing all steps while educating pt in basic principles of manual lymphatic drainage and proper sequence  PT Long Term Goals - 04/17/21 1000       PT LONG TERM GOAL #1   Title Pt will be independent in self MLD for long term management of lymphedema.    Time 4    Period Weeks    Status New    Target Date 05/15/21      PT LONG TERM GOAL #2   Title Pt will obtain appropriate compression  garments for long term management of lymphedema.    Time 4    Period Weeks    Status New    Target Date 05/15/21      PT LONG TERM GOAL #3   Title Pt will demonstrate a 1.5 cm decrease in edema 15 cm proximal to ulnar styloid process to decrease risk of cellulitis.    Baseline 33.2 cm    Time 4    Period Weeks    Status New    Target Date 05/15/21                    Plan - 04/17/21 0900     Clinical Impression Statement Pt presents to PT today with lymphedema of RUE. She underwent a lumpectomy with SLNB (0/4) as well as chemo and radiation. She began developing swelling in mid to late August and it was picked up on her SOZO which was elevated. She was measured for a custom sleeve since an off the shelf did not fit properly and still has not received her custom sleeve so she has not had a sleeve that fit correctly. Pt's ldex score was taken again yesterday and was still elevated so evaluated pt today for lymphedema. Pt has increased edema especially at upper forearm. She would benefit from skilled PT services to learn how to independently manage her lymphedema and decrease swelling.    Stability/Clinical Decision Making Stable/Uncomplicated    Clinical Decision Making Low    Rehab Potential Good    PT Frequency 2x / week    PT Duration 4 weeks    PT Treatment/Interventions Therapeutic exercise;Patient/family education;Manual lymph drainage;ADLs/Self Care Home Management;Manual techniques;Passive range of motion;Compression bandaging;Taping;Vasopneumatic Device    PT Next Visit Plan instruct in self MLD and issue handout, give lymphedema risk reduction handout, see if pt got a hold of a special place, re do sozo in 4 weeks    Consulted and Agree with Plan of Care Patient             Patient will benefit from skilled therapeutic intervention in order to improve the following deficits and impairments:  Increased edema, Decreased knowledge of precautions, Decreased knowledge of  use of DME, Postural dysfunction  Visit Diagnosis: Lymphedema, not elsewhere classified  Abnormal posture     Problem List Patient Active Problem List   Diagnosis Date Noted   Port-A-Cath in place 11/20/2020   Malignant neoplasm of upper-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, estrogen receptor positive ([place]) 09/20/2020   Precordial pain 04/14/2014   Essential hypertension, benign 08/03/2013   Family history of ischemic heart disease 08/03/2013    Manus Gunning, PT 04/17/2021, 10:03 AM  [place], [place], [address] Phone: [phone]   Fax:  [phone]  Name: ROCKELLE HEUERMAN MRN: [id number] Date of Birth: 04-24-1971  Manus Gunning, PT 04/17/21 10:03 AM

## 2021-04-19 ENCOUNTER — Other Ambulatory Visit: Payer: Self-pay

## 2021-04-19 ENCOUNTER — Ambulatory Visit: Payer: No Typology Code available for payment source | Admitting: Physical Therapy

## 2021-04-19 ENCOUNTER — Encounter: Payer: Self-pay | Admitting: Physical Therapy

## 2021-04-19 DIAGNOSIS — Z483 Aftercare following surgery for neoplasm: Secondary | ICD-10-CM | POA: Diagnosis not present

## 2021-04-19 DIAGNOSIS — I890 Lymphedema, not elsewhere classified: Secondary | ICD-10-CM

## 2021-04-19 DIAGNOSIS — R293 Abnormal posture: Secondary | ICD-10-CM

## 2021-04-19 NOTE — Patient Instructions (Signed)

## 2021-04-19 NOTE — Therapy (Signed)
[place], [place], [address] Phone: [phone]   Fax:  [phone]  Physical Therapy Treatment  Patient Details  Name: Grace Pennington MRN: [id number] Date of Birth: 08-Feb-1971 Referring Provider (PT): Lindi Adie   Encounter Date: 04/19/2021   PT End of Session - 04/19/21 1350     Visit Number 2    Number of Visits 9    Date for PT Re-Evaluation 05/15/21    PT Start Time 1303    PT Stop Time 1350   pt needed to leave early for a meeting   PT Time Calculation (min) 47 min    Activity Tolerance Patient tolerated treatment well    Behavior During Therapy WFL for tasks assessed/performed             Past Medical History:  Diagnosis Date   Anxiety    Breast cancer in female [facility])    Right   Depression    GERD (gastroesophageal reflux disease)    Hypertension    Palpitations    Sleep apnea     Past Surgical History:  Procedure Laterality Date   BREAST LUMPECTOMY WITH RADIOACTIVE SEED AND SENTINEL LYMPH NODE BIOPSY Right 10/16/2020   Procedure: RIGHT BREAST LUMPECTOMY WITH RADIOACTIVE SEED AND SENTINEL LYMPH NODE BIOPSY;  Surgeon: [name], MD;  Location: [place];  Service: General;  Laterality: Right;   PORTACATH PLACEMENT Left 10/16/2020   Procedure: INSERTION PORT-A-CATH;  Surgeon: [name], MD;  Location: [place];  Service: General;  Laterality: Left;   TUBAL LIGATION      There were no vitals filed for this visit.   Subjective Assessment - 04/19/21 1304     Subjective I need to leave about 15 min early today.    Pertinent History Rtlumpectomy and SLNB on 10/16/20 with Dr. [name] 0/4 nodes positive due to ER+ breast cancer with chemotherapy and radiation, completed radiation on 9/7,  HTN    Patient Stated Goals to get the swelling down    Currently in Pain? No/denies    Multiple Pain Sites No                               [facility] Adult PT Treatment/Exercise -  04/19/21 0001       Manual Therapy   Manual Lymphatic Drainage (MLD) in supine: short neck, 5 diaphragmatic breaths, left axillary nodes and establishment of interaxillary pathway, right inguinal nodes and establishment of axillo inguinal pathway, R UE working proximal to distal then retracing all steps while having pt demonstrate entire sequence and giving verbal and tactile cues as needed for proper skin stretch, pressure and sequence then therapist completed end of sequence                          PT Long Term Goals - 04/17/21 1000       PT LONG TERM GOAL #1   Title Pt will be independent in self MLD for long term management of lymphedema.    Time 4    Period Weeks    Status New    Target Date 05/15/21      PT LONG TERM GOAL #2   Title Pt will obtain appropriate compression garments for long term management of lymphedema.    Time 4    Period Weeks    Status New    Target Date 05/15/21  PT LONG TERM GOAL #3   Title Pt will demonstrate a 1.5 cm decrease in edema 15 cm proximal to ulnar styloid process to decrease risk of cellulitis.    Baseline 33.2 cm    Time 4    Period Weeks    Status New    Target Date 05/15/21                   Plan - 04/19/21 1352     Clinical Impression Statement Continued with MLD today. Issued handout to pt and instructed her in self MLD technique and had her return demonstrate. Pt only needed occasional cueing for correct pressure to use with skin stretch. Pt educated pt begin self MLD at home daily.    PT Frequency 2x / week    PT Duration 4 weeks    PT Treatment/Interventions Therapeutic exercise;Patient/family education;Manual lymph drainage;ADLs/Self Care Home Management;Manual techniques;Passive range of motion;Compression bandaging;Taping;Vasopneumatic Device    PT Next Visit Plan assess indep with self MLD, give lymphedema risk reduction handout, see if pt got a hold of a special place, re do sozo in 4 weeks     PT Home Exercise Plan post op; supine scapular series, self MLD    Consulted and Agree with Plan of Care Patient             Patient will benefit from skilled therapeutic intervention in order to improve the following deficits and impairments:  Increased edema, Decreased knowledge of precautions, Decreased knowledge of use of DME, Postural dysfunction  Visit Diagnosis: Lymphedema, not elsewhere classified  Abnormal posture     Problem List Patient Active Problem List   Diagnosis Date Noted   Port-A-Cath in place 11/20/2020   Malignant neoplasm of upper-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, estrogen receptor positive ([place]) 09/20/2020   Precordial pain 04/14/2014   Essential hypertension, benign 08/03/2013   Family history of ischemic heart disease 08/03/2013    Manus Gunning, PT 04/19/2021, 1:55 PM  [place], [place], [address] Phone: [phone]   Fax:  [phone]  Name: Grace Pennington MRN: [id number] Date of Birth: 12-Mar-1971  Manus Gunning, PT 04/19/21 1:56 PM

## 2021-04-26 ENCOUNTER — Other Ambulatory Visit: Payer: Self-pay | Admitting: *Deleted

## 2021-04-26 DIAGNOSIS — Z170 Estrogen receptor positive status [ER+]: Secondary | ICD-10-CM

## 2021-04-26 DIAGNOSIS — C50211 Malignant neoplasm of upper-inner quadrant of right female breast: Secondary | ICD-10-CM

## 2021-04-26 NOTE — Progress Notes (Signed)
Per MD referral successfully placed to Dr. Haroldine Laws for evaluation of decreased EF.  MD has suggested that tx be held at this time until pt is able to f/u with Dr. Haroldine Laws.  Referral message sent to HIM team.

## 2021-04-27 ENCOUNTER — Inpatient Hospital Stay: Payer: No Typology Code available for payment source

## 2021-04-27 ENCOUNTER — Inpatient Hospital Stay: Payer: No Typology Code available for payment source | Admitting: Hematology and Oncology

## 2021-05-02 ENCOUNTER — Ambulatory Visit: Payer: No Typology Code available for payment source | Admitting: Registered"

## 2021-05-02 ENCOUNTER — Encounter: Payer: Self-pay | Admitting: Physical Therapy

## 2021-05-02 ENCOUNTER — Other Ambulatory Visit: Payer: Self-pay | Admitting: Hematology and Oncology

## 2021-05-02 ENCOUNTER — Ambulatory Visit: Payer: No Typology Code available for payment source | Attending: General Surgery | Admitting: Physical Therapy

## 2021-05-02 ENCOUNTER — Other Ambulatory Visit: Payer: Self-pay

## 2021-05-02 DIAGNOSIS — I890 Lymphedema, not elsewhere classified: Secondary | ICD-10-CM | POA: Insufficient documentation

## 2021-05-02 DIAGNOSIS — R293 Abnormal posture: Secondary | ICD-10-CM | POA: Diagnosis present

## 2021-05-02 NOTE — Therapy (Signed)
[place] [facility] [place], [place], [address] Phone:     Fax:     Physical Therapy Treatment  Patient Details  Name: Grace Pennington MRN: [id number] Date of Birth: 02/09/1971 Referring Provider (PT): Lindi Adie   Encounter Date: 05/02/2021   PT End of Session - 05/02/21 0857     Visit Number 3    Number of Visits 9    Date for PT Re-Evaluation 05/15/21    PT Start Time 0809    PT Stop Time 3419    PT Time Calculation (min) 45 min    Activity Tolerance Patient tolerated treatment well    Behavior During Therapy [facility] for tasks assessed/performed             Past Medical History:  Diagnosis Date   Anxiety    Breast cancer in female [facility])    Right   Depression    GERD (gastroesophageal reflux disease)    Hypertension    Palpitations    Sleep apnea     Past Surgical History:  Procedure Laterality Date   BREAST LUMPECTOMY WITH RADIOACTIVE SEED AND SENTINEL LYMPH NODE BIOPSY Right 10/16/2020   Procedure: RIGHT BREAST LUMPECTOMY WITH RADIOACTIVE SEED AND SENTINEL LYMPH NODE BIOPSY;  Surgeon: [name], MD;  Location: [place];  Service: General;  Laterality: Right;   PORTACATH PLACEMENT Left 10/16/2020   Procedure: INSERTION PORT-A-CATH;  Surgeon: [name], MD;  Location: [place];  Service: General;  Laterality: Left;   TUBAL LIGATION      There were no vitals filed for this visit.   Subjective Assessment - 05/02/21 0811     Subjective I still have not gotten my sleeve.    Pertinent History Rtlumpectomy and SLNB on 10/16/20 with Dr. [name] 0/4 nodes positive due to ER+ breast cancer with chemotherapy and radiation, completed radiation on 9/7,  HTN    Patient Stated Goals to get the swelling down    Currently in Pain? No/denies    Multiple Pain Sites No                               [facility] Adult PT Treatment/Exercise - 05/02/21 0001       Manual Therapy   Manual Lymphatic  Drainage (MLD) in supine: short neck, 5 diaphragmatic breaths, left axillary nodes and establishment of interaxillary pathway, right inguinal nodes and establishment of axillo inguinal pathway, R UE working proximal to distal then retracing all steps                          PT Long Term Goals - 04/17/21 1000       PT LONG TERM GOAL #1   Title Pt will be independent in self MLD for long term management of lymphedema.    Time 4    Period Weeks    Status New    Target Date 05/15/21      PT LONG TERM GOAL #2   Title Pt will obtain appropriate compression garments for long term management of lymphedema.    Time 4    Period Weeks    Status New    Target Date 05/15/21      PT LONG TERM GOAL #3   Title Pt will demonstrate a 1.5 cm decrease in edema 15 cm proximal to ulnar styloid process to decrease risk  of cellulitis.    Baseline 33.2 cm    Time 4    Period Weeks    Status New    Target Date 05/15/21                   Plan - 05/02/21 0858     Clinical Impression Statement Pt still has not heard back from DME supplier regarding her compression sleeve. She has left several unreturned voicemails. She is going to go in person today to check on the status. If it is still going to be a while before she gets her sleeve will instruct pt in compression bandaging technique at next session. Continued with MLD to RUE today.    PT Frequency 2x / week    PT Duration 4 weeks    PT Treatment/Interventions Therapeutic exercise;Patient/family education;Manual lymph drainage;ADLs/Self Care Home Management;Manual techniques;Passive range of motion;Compression bandaging;Taping;Vasopneumatic Device    PT Next Visit Plan instruct in compression bandaging if pt has to wait a while for a sleeve, assess indep with self MLD, give lymphedema risk reduction handout, see if pt got a hold of a special place, re do sozo in 4 weeks    PT Home Exercise Plan post op; supine scapular series,  self MLD    Consulted and Agree with Plan of Care Patient             Patient will benefit from skilled therapeutic intervention in order to improve the following deficits and impairments:  Increased edema, Decreased knowledge of precautions, Decreased knowledge of use of DME, Postural dysfunction  Visit Diagnosis: Lymphedema, not elsewhere classified  Abnormal posture     Problem List Patient Active Problem List   Diagnosis Date Noted   Port-A-Cath in place 11/20/2020   Malignant neoplasm of upper-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, estrogen receptor positive ([place]) 09/20/2020   Precordial pain 04/14/2014   Essential hypertension, benign 08/03/2013   Family history of ischemic heart disease 08/03/2013    Manus Gunning, PT 05/02/2021, 9:01 AM  [place] @ [place], [place], [address] Phone:     Fax:     Name: Grace Pennington MRN: [id number] Date of Birth: November 23, 1970   Manus Gunning, PT 05/02/21 9:01 AM

## 2021-05-04 ENCOUNTER — Other Ambulatory Visit (HOSPITAL_COMMUNITY): Payer: Self-pay

## 2021-05-07 ENCOUNTER — Ambulatory Visit
Admission: RE | Admit: 2021-05-07 | Discharge: 2021-05-07 | Disposition: A | Discharge status: Home / Self Care | Payer: No Typology Code available for payment source | Source: Ambulatory Visit | Attending: Hematology and Oncology | Admitting: Hematology and Oncology

## 2021-05-07 DIAGNOSIS — C50211 Malignant neoplasm of upper-inner quadrant of right female breast: Secondary | ICD-10-CM

## 2021-05-07 NOTE — Progress Notes (Signed)
  Radiation Oncology         [phone] ________________________________  Name: Grace Pennington MRN: [id number]  Date of Service: 05/07/2021  DOB: 17-Sep-1970  Post Treatment Telephone Note  Diagnosis:   Stage IA, pT1bN0M0 grade 3 invasive carcinoma of the right breast.  Interval Since Last Radiation:  5 weeks   02/15/2021 through 04/04/2021 Site Technique Total Dose (Gy) Dose per Fx (Gy) Completed Fx Beam Energies  Breast, Right: Breast_Rt 3D 50.4/50.4 1.8 28/28 10X  Breast, Right: Breast_Rt_Bst 3D 10/10 2 5/5 6X, 10X    Narrative:  The patient was contacted today for routine follow-up. During treatment she did very well with radiotherapy and did not have significant desquamation. She reports she is doing well. She is using radioplex and notes her skin continues to improve. Her hyperpigmentation is almost resolved and she still has some soreness of the skin.  Impression/Plan: 1. Stage IA, pT1bN0M0 grade 3 invasive carcinoma of the right breast.. The patient has been doing well since completion of radiotherapy. We discussed that we would be happy to continue to follow her as needed, but she will also continue to follow up with Dr. Lindi Adie in medical oncology. She was counseled on skin care as well as measures to avoid sun exposure to this area which she already plans on with sunscreen in her current skin care regimen.  2. Survivorship. We discussed the importance of survivorship evaluation and encouraged her to attend her upcoming visit with that clinic.     Carola Rhine, PAC

## 2021-05-08 ENCOUNTER — Ambulatory Visit: Payer: No Typology Code available for payment source | Admitting: Physical Therapy

## 2021-05-08 ENCOUNTER — Other Ambulatory Visit: Payer: Self-pay

## 2021-05-08 ENCOUNTER — Encounter: Payer: Self-pay | Admitting: Physical Therapy

## 2021-05-08 DIAGNOSIS — I890 Lymphedema, not elsewhere classified: Secondary | ICD-10-CM

## 2021-05-08 DIAGNOSIS — R293 Abnormal posture: Secondary | ICD-10-CM

## 2021-05-08 NOTE — Therapy (Signed)
[facility] @ [place], [place], [address] Phone: [phone]   Fax:  [phone]  Physical Therapy Treatment  Patient Details  Name: Grace Pennington MRN: [id number] Date of Birth: 07/16/1971 Referring Provider (PT): Lindi Adie   Encounter Date: 05/08/2021   PT End of Session - 05/08/21 0856     Visit Number 4    Number of Visits 9    Date for PT Re-Evaluation 05/15/21    PT Start Time 0804    PT Stop Time 3662    PT Time Calculation (min) 50 min    Activity Tolerance Patient tolerated treatment well    Behavior During Therapy [facility] for tasks assessed/performed             Past Medical History:  Diagnosis Date   Anxiety    Breast cancer in female [facility])    Right   Depression    GERD (gastroesophageal reflux disease)    Hypertension    Palpitations    Sleep apnea     Past Surgical History:  Procedure Laterality Date   BREAST LUMPECTOMY WITH RADIOACTIVE SEED AND SENTINEL LYMPH NODE BIOPSY Right 10/16/2020   Procedure: RIGHT BREAST LUMPECTOMY WITH RADIOACTIVE SEED AND SENTINEL LYMPH NODE BIOPSY;  Surgeon: [name], MD;  Location: [facility];  Service: General;  Laterality: Right;   PORTACATH PLACEMENT Left 10/16/2020   Procedure: INSERTION PORT-A-CATH;  Surgeon: [name], MD;  Location: [facility];  Service: General;  Laterality: Left;   TUBAL LIGATION      There were no vitals filed for this visit.   Subjective Assessment - 05/08/21 0804     Subjective I called [facility] and they told me they would call me when they figure out what is going on and that was a week ago and I haven't heard anything.    Pertinent History Rtlumpectomy and SLNB on 10/16/20 with Dr. [name] 0/4 nodes positive due to ER+ breast cancer with chemotherapy and radiation, completed radiation on 9/7,  HTN    Patient Stated Goals to get the swelling down    Currently in Pain? No/denies                                [facility] Adult PT Treatment/Exercise - 05/08/21 0001       Manual Therapy   Manual Lymphatic Drainage (MLD) in supine: short neck, 5 diaphragmatic breaths, left axillary nodes and establishment of interaxillary pathway, right inguinal nodes and establishment of axillo inguinal pathway, R UE working proximal to distal then retracing all steps                          PT Long Term Goals - 04/17/21 1000       PT LONG TERM GOAL #1   Title Pt will be independent in self MLD for long term management of lymphedema.    Time 4    Period Weeks    Status New    Target Date 05/15/21      PT LONG TERM GOAL #2   Title Pt will obtain appropriate compression garments for long term management of lymphedema.    Time 4    Period Weeks    Status New    Target Date 05/15/21      PT LONG TERM GOAL #3   Title Pt will demonstrate  a 1.5 cm decrease in edema 15 cm proximal to ulnar styloid process to decrease risk of cellulitis.    Baseline 33.2 cm    Time 4    Period Weeks    Status New    Target Date 05/15/21                   Plan - 05/08/21 0857     Clinical Impression Statement Continued with MLD today to RUE. Pt reports at end of session her jacket fit more loosely in the sleeve due to reduced swelling. Pt reports she contacted the DME company about her sleeve and they told her they would look in to it and call her back. She has not heard back and it has been a week. Pt to reach out again today to see if they have ordered her sleeve.    PT Frequency 2x / week    PT Duration 4 weeks    PT Treatment/Interventions Therapeutic exercise;Patient/family education;Manual lymph drainage;ADLs/Self Care Home Management;Manual techniques;Passive range of motion;Compression bandaging;Taping;Vasopneumatic Device    PT Next Visit Plan any update on sleeve? does pt want instruction in compression bandaging since she doesnt have a sleeve, assess indep  with self MLD, give lymphedema risk reduction handout, see if pt got a hold of [facility], re do sozo in 4 weeks    PT Home Exercise Plan post op; supine scapular series, self MLD    Consulted and Agree with Plan of Care Patient             Patient will benefit from skilled therapeutic intervention in order to improve the following deficits and impairments:  Increased edema, Decreased knowledge of precautions, Decreased knowledge of use of DME, Postural dysfunction  Visit Diagnosis: Lymphedema, not elsewhere classified  Abnormal posture     Problem List Patient Active Problem List   Diagnosis Date Noted   Port-A-Cath in place 11/20/2020   Malignant neoplasm of upper-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, estrogen receptor positive ([facility] [place]) 09/20/2020   Precordial pain 04/14/2014   Essential hypertension, benign 08/03/2013   Family history of ischemic heart disease 08/03/2013    Manus Gunning, PT 05/08/2021, 8:59 AM  [facility] @ [place], [place], [address] Phone: [phone]   Fax:  [phone]  Name: Grace Pennington MRN: [id number] Date of Birth: 1970-09-19   Manus Gunning, PT 05/08/21 8:59 AM

## 2021-05-15 ENCOUNTER — Ambulatory Visit: Payer: No Typology Code available for payment source | Admitting: Physical Therapy

## 2021-05-17 ENCOUNTER — Encounter: Payer: Self-pay | Admitting: *Deleted

## 2021-05-17 ENCOUNTER — Other Ambulatory Visit: Payer: Self-pay

## 2021-05-17 ENCOUNTER — Telehealth (HOSPITAL_COMMUNITY): Payer: Self-pay | Admitting: Surgery

## 2021-05-17 ENCOUNTER — Ambulatory Visit (HOSPITAL_COMMUNITY)
Admission: RE | Admit: 2021-05-17 | Discharge: 2021-05-17 | Disposition: A | Discharge status: Home / Self Care | Payer: No Typology Code available for payment source | Source: Ambulatory Visit | Attending: Internal Medicine | Admitting: Internal Medicine

## 2021-05-17 ENCOUNTER — Encounter (INDEPENDENT_AMBULATORY_CARE_PROVIDER_SITE_OTHER): Payer: No Typology Code available for payment source | Admitting: Cardiology

## 2021-05-17 ENCOUNTER — Encounter (HOSPITAL_COMMUNITY): Payer: Self-pay | Admitting: Internal Medicine

## 2021-05-17 ENCOUNTER — Ambulatory Visit (HOSPITAL_BASED_OUTPATIENT_CLINIC_OR_DEPARTMENT_OTHER)
Admission: RE | Admit: 2021-05-17 | Discharge: 2021-05-17 | Disposition: A | Discharge status: Home / Self Care | Payer: No Typology Code available for payment source | Source: Ambulatory Visit | Attending: Internal Medicine | Admitting: Internal Medicine

## 2021-05-17 VITALS — BP 108/70 | HR 71 | Wt 261.6 lb

## 2021-05-17 DIAGNOSIS — I10 Essential (primary) hypertension: Secondary | ICD-10-CM | POA: Diagnosis not present

## 2021-05-17 DIAGNOSIS — Z170 Estrogen receptor positive status [ER+]: Secondary | ICD-10-CM | POA: Insufficient documentation

## 2021-05-17 DIAGNOSIS — G4733 Obstructive sleep apnea (adult) (pediatric): Secondary | ICD-10-CM | POA: Diagnosis not present

## 2021-05-17 DIAGNOSIS — C50211 Malignant neoplasm of upper-inner quadrant of right female breast: Secondary | ICD-10-CM

## 2021-05-17 DIAGNOSIS — T451X5A Adverse effect of antineoplastic and immunosuppressive drugs, initial encounter: Secondary | ICD-10-CM | POA: Insufficient documentation

## 2021-05-17 DIAGNOSIS — I427 Cardiomyopathy due to drug and external agent: Secondary | ICD-10-CM

## 2021-05-17 DIAGNOSIS — I509 Heart failure, unspecified: Secondary | ICD-10-CM | POA: Diagnosis not present

## 2021-05-17 DIAGNOSIS — Z7984 Long term (current) use of oral hypoglycemic drugs: Secondary | ICD-10-CM | POA: Diagnosis not present

## 2021-05-17 DIAGNOSIS — C50911 Malignant neoplasm of unspecified site of right female breast: Secondary | ICD-10-CM | POA: Insufficient documentation

## 2021-05-17 DIAGNOSIS — Z79899 Other long term (current) drug therapy: Secondary | ICD-10-CM | POA: Insufficient documentation

## 2021-05-17 DIAGNOSIS — E669 Obesity, unspecified: Secondary | ICD-10-CM | POA: Insufficient documentation

## 2021-05-17 DIAGNOSIS — I110 Hypertensive heart disease with heart failure: Secondary | ICD-10-CM | POA: Diagnosis not present

## 2021-05-17 DIAGNOSIS — Z0189 Encounter for other specified special examinations: Secondary | ICD-10-CM | POA: Diagnosis not present

## 2021-05-17 DIAGNOSIS — Z87891 Personal history of nicotine dependence: Secondary | ICD-10-CM | POA: Diagnosis not present

## 2021-05-17 LAB — ECHOCARDIOGRAM COMPLETE
Area-P 1/2: 4.44 cm2
S' Lateral: 3.5 cm

## 2021-05-17 NOTE — Progress Notes (Signed)
  Echocardiogram 2D Echocardiogram has been performed.  Grace Pennington 05/17/2021, 9:00 AM

## 2021-05-17 NOTE — Telephone Encounter (Signed)
Patient visited clinic to inquire about ordered home sleep study.  It has been in clinical review with [employer] pending authorization however I was told today that they are now saying prior Grace Pennington is not necessary.  I passed this information on to Ms. Yetman indicating that she can proceed and she plans to complete the study tonite.

## 2021-05-17 NOTE — Progress Notes (Signed)
CARDIO-ONCOLOGY CLINIC NOTE  Referring Physician:Gudena, Vinay Primary Care:Polite,  Jori Moll Primary Cardiologist:Velena Keegan, Quillian Quince  HPI:  Grace Pennington is 50 y.o. female with HTN, Depression, Anxiety,  Breast Cancer referred by Dr. Nicholas Lose for enrollment into the Cardio-Oncology program.  09/08/2020: Stage IA (cT1b, cN0, cM0, G2, ER+, PR+, HER2+).  10/16/20 right lumpectomy high grade DCIS, clear margins, 4 right axillary lymph nodes negative for carcinoma.  Started on BREAST PACLITAXEL + TRASTUZUMAB Q7D / TRASTUZUMAB Q21D which she recently finished. She will start on Herceptin maintenance for 1 year plan to start 02/2021.  Adjuvant radiation started 01/2021.  Also will be started on antiestrogen therapy.    10/2020 ECHO EF 60-65%, GLS -24.8% 01/2021 ECHO EF 50-55% abnormal septal motion, GLS -18.9% partially brought down by apical 3C view, ? poor visualization of lateral wall.  Maybe subtle decrease in EF/increase in GLS but not clinically significant 04/03/2021 ECHO ECHO EF 45-50%, GLS ~ -13% mild worsening in EF   Herceptin held at last visit in 9/22 due to dropping EF. Losartan added to carvedilol. Here for f/u. Feels ok. Finished XRT. Denies SOB, orthopnea or PND.  Echo today 05/17/21 EF 55% G1DD GLS -16.1% GLS underestimated due to poor endocardial trackin on 3 and 4-chamber images. 2-chamber image looks more accurate at -18.9     Past Medical History:  Diagnosis Date   Anxiety    Breast cancer in female [facility])    Right   Depression    GERD (gastroesophageal reflux disease)    Hypertension    Palpitations    Sleep apnea     Current Outpatient Medications  Medication Sig Dispense Refill   ALPRAZolam (XANAX) 0.25 MG tablet Take 1 tablet by mouth once a day if needed 30 tablet 3   carvedilol (COREG) 3.125 MG tablet Take 1 tablet (3.125 mg total) by mouth 2 (two) times daily. 60 tablet 3   citalopram (CELEXA) 10 MG tablet TAKE 1 TABLET BY MOUTH ONCE DAILY 90 tablet 3    fluticasone (FLONASE) 50 MCG/ACT nasal spray Place 2 sprays into both nostrils daily as needed for allergies.     hydrochlorothiazide (HYDRODIURIL) 25 MG tablet TAKE 1 TABLET BY MOUTH ONCE DAILY 90 tablet 3   hydroxypropyl methylcellulose / hypromellose (ISOPTO TEARS / GONIOVISC) 2.5 % ophthalmic solution Place 1 drop into both eyes 3 (three) times daily as needed (dry/irritated eyes).     lidocaine-prilocaine (EMLA) cream APPLY TO AFFECTED AREA ONCE 30 g 3   loratadine (CLARITIN) 10 MG tablet Take 10 mg by mouth daily as needed for allergies.     losartan (COZAAR) 25 MG tablet Take 1 tablet (25 mg total) by mouth daily. 90 tablet 2   metFORMIN (GLUCOPHAGE) 1000 MG tablet Take 1,000 mg by mouth 2 (two) times daily with a meal.     metFORMIN (GLUCOPHAGE) 1000 MG tablet Take 1,000 mg by mouth 2 (two) times daily with a meal.     Multiple Vitamins-Minerals (MULTIVITAMIN WITH MINERALS) tablet Take 1 tablet by mouth daily.     pantoprazole (PROTONIX) 40 MG tablet TAKE 1 TABLET BY MOUTH DAILY 90 tablet 1   rosuvastatin (CRESTOR) 10 MG tablet TAKE 1 TABLET BY MOUTH ONCE A DAY 90 tablet 3   spironolactone (ALDACTONE) 50 MG tablet Take 50 mg by mouth daily.     No current facility-administered medications for this encounter.    No Known Allergies    Social History   Socioeconomic History   Marital status: Single  Spouse name: Not on file   Number of children: 2   Years of education: Not on file   Highest education level: Not on file  Occupational History   Occupation: medical records    Employer: [employer]  Tobacco Use   Smoking status: Former    Types: Cigarettes    Quit date: 07/29/1996    Years since quitting: 24.8   Smokeless tobacco: Never  Vaping Use   Vaping Use: Never used  Substance and Sexual Activity   Alcohol use: Yes    Comment: socially   Drug use: No   Sexual activity: Yes    Birth control/protection: Surgical  Other Topics Concern   Not on file  Social History  Narrative   Not on file   Social Determinants of Health   Financial Resource Strain: Not on file  Food Insecurity: Not on file  Transportation Needs: Not on file  Physical Activity: Not on file  Stress: Not on file  Social Connections: Not on file  Intimate Partner Violence: Not At Risk   Fear of Current or Ex-Partner: No   Emotionally Abused: No   Physically Abused: No   Sexually Abused: No      Family History  Problem Relation Age of Onset   Heart disease Mother    Diabetes Mother    Hyperlipidemia Mother    Hypertension Mother    Kidney failure Mother    Diabetes Other    Stroke Other    Hypertension Other    Kidney failure Other    Coronary artery disease Other    Sleep apnea Father    Diabetes Father    Hyperlipidemia Father    Hypertension Father     Vitals:   05/17/21 0912  BP: 108/70  Pulse: 71  SpO2: 97%  Weight: 118.7 kg (261 lb 9.6 oz)     PHYSICAL EXAM: General:  Well appearing. No resp difficulty HEENT: normal Neck: supple. no JVD. Carotids 2+ bilat; no bruits. No lymphadenopathy or thryomegaly appreciated. Cor: PMI nondisplaced. Regular rate & rhythm. No rubs, gallops or murmurs. Lungs: clear Abdomen: obese soft, nontender, nondistended. No hepatosplenomegaly. No bruits or masses. Good bowel sounds. Extremities: no cyanosis, clubbing, rash, edema Neuro: alert & orientedx3, cranial nerves grossly intact. moves all 4 extremities w/o difficulty. Affect pleasant    ASSESSMENT & PLAN: 1. R Breast Cancer c/p chemo-induce cardiomyopathy (Herceptin) - 08/2020 diagnosed with Stage IA (cT1b, cN0, cM0, G2, ER+, PR+, HER2+) R Breast Cancer -10/2020 ECHO EF 60-65%, GLS -24.8% -01/2021 ECHO EF 50-55% abnormal septal motion, GLS -18.9% partially brought down by apical 3C view, ? poor visualization of lateral wall.  Maybe subtle decrease in EF/increase in GLS but not clinically significant -04/03/2021 ECHO EF 45-50%, GLS ~ -13% mild worsening in EF  - Echo  today 05/17/21 EF 55% G1DD GLS -16.1% GLS underestimated due to poor endocardial trackin on 3 and 4-chamber images. 2-chamber image looks more accurate at -18.9 -Continue losartan, spiro and carvedilol  - EF improved with holding Herceptin - Resume Herceptin. Repeat echo after next 3 doses  Glori Bickers, MD  9:17 AM

## 2021-05-17 NOTE — Patient Instructions (Signed)
Good to see you today!  No medication changes today   Your physician has requested that you have an echocardiogram. Echocardiography is a painless test that uses sound waves to create images of your heart. It provides your doctor with information about the size and shape of your heart and how well your heart's chambers and valves are working. This procedure takes approximately one hour. There are no restrictions for this procedure.   Your physician recommends that you schedule a follow-up appointment in: 2 months with an echocardiogram  If you have any questions or concerns before your next appointment please send [place] a message through [place] or call our office at [phone].    TO LEAVE A MESSAGE FOR THE NURSE SELECT OPTION 2, PLEASE LEAVE A MESSAGE INCLUDING: YOUR NAME DATE OF BIRTH CALL BACK NUMBER REASON FOR CALL**this is important as we prioritize the call backs  YOU WILL RECEIVE A CALL BACK THE SAME DAY AS LONG AS YOU CALL BEFORE 4:00 PM  At the [facility], you and your health needs are our priority. As part of our continuing mission to provide you with exceptional heart care, we have created designated Provider Care Teams. These Care Teams include your primary Cardiologist (physician) and Advanced Practice Providers (APPs- Physician Assistants and Nurse Practitioners) who all work together to provide you with the care you need, when you need it.   You may see any of the following providers on your designated Care Team at your next follow up: Dr Glori Bickers Dr Loralie Champagne Dr Patrice Paradise, NP Lyda Jester, [place] Ginnie Smart Audry Riles, PharmD   Please be sure to bring in all your medications bottles to every appointment.

## 2021-05-17 NOTE — Progress Notes (Signed)
Per MD, after speaking with Cardiology today, pt okay to proceed with tx as planned tomorrow.

## 2021-05-18 ENCOUNTER — Other Ambulatory Visit (HOSPITAL_COMMUNITY): Payer: Self-pay

## 2021-05-18 ENCOUNTER — Inpatient Hospital Stay: Payer: No Typology Code available for payment source | Attending: Hematology and Oncology

## 2021-05-18 ENCOUNTER — Encounter: Payer: Self-pay | Admitting: Hematology and Oncology

## 2021-05-18 ENCOUNTER — Other Ambulatory Visit: Payer: Self-pay | Admitting: Hematology and Oncology

## 2021-05-18 ENCOUNTER — Inpatient Hospital Stay: Payer: No Typology Code available for payment source

## 2021-05-18 ENCOUNTER — Ambulatory Visit: Payer: No Typology Code available for payment source | Admitting: Hematology and Oncology

## 2021-05-18 VITALS — BP 108/78 | HR 82 | Temp 98.0°F | Resp 16 | Wt 264.0 lb

## 2021-05-18 DIAGNOSIS — Z923 Personal history of irradiation: Secondary | ICD-10-CM | POA: Diagnosis not present

## 2021-05-18 DIAGNOSIS — C50211 Malignant neoplasm of upper-inner quadrant of right female breast: Secondary | ICD-10-CM | POA: Diagnosis present

## 2021-05-18 DIAGNOSIS — Z5112 Encounter for antineoplastic immunotherapy: Secondary | ICD-10-CM | POA: Diagnosis not present

## 2021-05-18 DIAGNOSIS — Z170 Estrogen receptor positive status [ER+]: Secondary | ICD-10-CM | POA: Diagnosis not present

## 2021-05-18 DIAGNOSIS — Z95828 Presence of other vascular implants and grafts: Secondary | ICD-10-CM

## 2021-05-18 LAB — CBC WITH DIFFERENTIAL (CANCER CENTER ONLY)
Abs Immature Granulocytes: 0.01 10*3/uL (ref 0.00–0.07)
Basophils Absolute: 0 10*3/uL (ref 0.0–0.1)
Basophils Relative: 1 %
Eosinophils Absolute: 0.3 10*3/uL (ref 0.0–0.5)
Eosinophils Relative: 5 %
HCT: 39.1 % (ref 36.0–46.0)
Hemoglobin: 13 g/dL (ref 12.0–15.0)
Immature Granulocytes: 0 %
Lymphocytes Relative: 26 %
Lymphs Abs: 1.5 10*3/uL (ref 0.7–4.0)
MCH: 28.9 pg (ref 26.0–34.0)
MCHC: 33.2 g/dL (ref 30.0–36.0)
MCV: 86.9 fL (ref 80.0–100.0)
Monocytes Absolute: 0.6 10*3/uL (ref 0.1–1.0)
Monocytes Relative: 11 %
Neutro Abs: 3.2 10*3/uL (ref 1.7–7.7)
Neutrophils Relative %: 57 %
Platelet Count: 269 10*3/uL (ref 150–400)
RBC: 4.5 MIL/uL (ref 3.87–5.11)
RDW: 13.3 % (ref 11.5–15.5)
WBC Count: 5.6 10*3/uL (ref 4.0–10.5)
nRBC: 0 % (ref 0.0–0.2)

## 2021-05-18 LAB — CMP (CANCER CENTER ONLY)
ALT: 25 U/L (ref 0–44)
AST: 25 U/L (ref 15–41)
Albumin: 3.9 g/dL (ref 3.5–5.0)
Alkaline Phosphatase: 62 U/L (ref 38–126)
Anion gap: 7 (ref 5–15)
BUN: 20 mg/dL (ref 6–20)
CO2: 27 mmol/L (ref 22–32)
Calcium: 9.4 mg/dL (ref 8.9–10.3)
Chloride: 105 mmol/L (ref 98–111)
Creatinine: 0.84 mg/dL (ref 0.44–1.00)
GFR, Estimated: >60 mL/min (ref >60)
Glucose, Bld: 120 mg/dL — ABNORMAL HIGH (ref 70–99)
Potassium: 3.5 mmol/L (ref 3.5–5.1)
Sodium: 139 mmol/L (ref 135–145)
Total Bilirubin: 0.3 mg/dL (ref 0.3–1.2)
Total Protein: 7.7 g/dL (ref 6.5–8.1)

## 2021-05-18 MED ORDER — SODIUM CHLORIDE 0.9 % IV SOLN: Freq: Once | INTRAVENOUS | Status: AC

## 2021-05-18 MED ORDER — SODIUM CHLORIDE 0.9% FLUSH
10.0000 mL | Freq: Once | INTRAVENOUS | Status: AC
  Administered 2021-05-18: 10 mL

## 2021-05-18 MED ORDER — SODIUM CHLORIDE 0.9% FLUSH
10.0000 mL | INTRAVENOUS | Status: DC | PRN
  Administered 2021-05-18: 10 mL

## 2021-05-18 MED ORDER — HEPARIN SOD (PORK) LOCK FLUSH 100 UNIT/ML IV SOLN
500.0000 [IU] | Freq: Once | INTRAVENOUS | Status: AC | PRN
  Administered 2021-05-18: 500 [IU]

## 2021-05-18 MED ORDER — DIPHENHYDRAMINE HCL 25 MG PO CAPS
50.0000 mg | ORAL_CAPSULE | Freq: Once | ORAL | Status: AC
  Administered 2021-05-18: 50 mg via ORAL
  Filled 2021-05-18: qty 2

## 2021-05-18 MED ORDER — SODIUM CHLORIDE 0.9 % IV SOLN
6.0000 mg/kg | Freq: Once | INTRAVENOUS | Status: AC
  Administered 2021-05-18: 693 mg via INTRAVENOUS
  Filled 2021-05-18: qty 33

## 2021-05-18 MED ORDER — INFLUENZA VAC SPLIT QUAD 0.5 ML IM SUSY
0.5000 mL | PREFILLED_SYRINGE | INTRAMUSCULAR | 0 refills | Status: DC
  Filled 2021-05-18: qty 0.5, 1d supply, fill #0

## 2021-05-18 MED ORDER — ACETAMINOPHEN 325 MG PO TABS
650.0000 mg | ORAL_TABLET | Freq: Once | ORAL | Status: AC
Start: 2021-05-18 — End: 2021-05-18
  Administered 2021-05-18: 650 mg via ORAL
  Filled 2021-05-18: qty 2

## 2021-05-18 NOTE — Patient Instructions (Signed)
[facility] MEDICAL ONCOLOGY  Discharge Instructions: Thank you for choosing [facility] to provide your oncology and hematology care.   If you have a lab appointment with the Cancer Center, please go directly to the Cancer Center and check in at the registration area.   Wear comfortable clothing and clothing appropriate for easy access to any Portacath or PICC line.   We strive to give you quality time with your provider. You may need to reschedule your appointment if you arrive late (15 or more minutes).  Arriving late affects you and other patients whose appointments are after yours.  Also, if you miss three or more appointments without notifying the office, you may be dismissed from the clinic at the provider's discretion.      For prescription refill requests, have your pharmacy contact our office and allow 72 hours for refills to be completed.    Today you received the following chemotherapy and/or immunotherapy agents trastuzumab      To help prevent nausea and vomiting after your treatment, we encourage you to take your nausea medication as directed.  BELOW ARE SYMPTOMS THAT SHOULD BE REPORTED IMMEDIATELY: *FEVER GREATER THAN 100.4 F (38 C) OR HIGHER *CHILLS OR SWEATING *NAUSEA AND VOMITING THAT IS NOT CONTROLLED WITH YOUR NAUSEA MEDICATION *UNUSUAL SHORTNESS OF BREATH *UNUSUAL BRUISING OR BLEEDING *URINARY PROBLEMS (pain or burning when urinating, or frequent urination) *BOWEL PROBLEMS (unusual diarrhea, constipation, pain near the anus) TENDERNESS IN MOUTH AND THROAT WITH OR WITHOUT PRESENCE OF ULCERS (sore throat, sores in mouth, or a toothache) UNUSUAL RASH, SWELLING OR PAIN  UNUSUAL VAGINAL DISCHARGE OR ITCHING   Items with * indicate a potential emergency and should be followed up as soon as possible or go to the Emergency Department if any problems should occur.  Please show the CHEMOTHERAPY ALERT CARD or IMMUNOTHERAPY ALERT CARD at check-in to  the Emergency Department and triage nurse.  Should you have questions after your visit or need to cancel or reschedule your appointment, please contact [facility]ICAL [facility]COLOG[facility]  Dept: [phone]  and follow the prompts.  Office hours are 8:00 a.m. to 4:30 p.m. Monday - Friday. Please note that voicemails left after 4:00 p.m. may not be returned until the following business day.  We are closed weekends and major holidays. You have access to a nurse at all times for urgent questions. Please call the main number to the clinic Dept: [phone] and follow the prompts.   For any non-urgent questions, you may also contact your provider using MyChart. We now offer e-Visits for anyone 18 and older to request care online for non-urgent symptoms. For details visit [email or web address].   Also download the MyChart app! Go to the app store, search "MyChart", open the app, select [facility], and log in with your MyChart username and password.  Due to Covid, a mask is required upon entering the hospital/clinic. If you do not have a mask, one will be given to you upon arrival. For doctor visits, patients may have 1 support person aged 18 or older with them. For treatment visits, patients cannot have anyone with them due to current Covid guidelines and our immunocompromised population.   

## 2021-05-19 NOTE — Procedures (Addendum)
       Sleep Study Report   Patient Information Study Date: 05/17/21 Patient Name: Grace Pennington Patient ID: [id number] Birth Date: 2070-08-20 Age: 50 Gender:Female Referring Physician: Glori Bickers, MD   TEST DESCRIPTION: Home sleep apnea testing was completed using the WatchPat, a Type 1 device, utilizing peripheral arterial tonometry (PAT), chest movement, actigraphy, pulse oximetry, pulse rate, body position and snore. AHI was calculated with apnea and hypopnea using valid sleep time as the denominator. RDI includes apneas, hypopneas, and RERAs. The data acquired and the scoring of sleep and all associated events were performed in accordance with the recommended standards and specifications as outlined in the AASM Manual for the Scoring of Sleep and Associated Events 2.2.0 (2015).   FINDINGS: 1. Moderate Obstructive Sleep Apnea with AHI 18.9/hr. 2. No Central Sleep Apnea with pAHIc 0/hr. 3. Oxygen desaturations as low as 77%. 4. Mild snoring was present. O2 sats were < 88% for 11.1 min. 5. Total sleep time was 6 hrs and 47 min. 6. 35.7% of total sleep time was spent in REM sleep. 7. Shortened sleep onset latency at 6 min 8. Prolonged REM sleep onset latency at 136 min. 9. Total awakenings were 4.   DIAGNOSIS: Moderate Obstructive Sleep Apnea (G47.33)   RECOMMENDATIONS:   1. Clinical correlation of these findings is necessary. The decision to treat obstructive sleep apnea (OSA) is usually based on the presence of apnea symptoms or the presence of associated medical conditions such as Hypertension, Congestive Heart Failure, Atrial Fibrillation or Obesity. The most common symptoms of OSA are snoring, gasping for breath while sleeping, daytime sleepiness and fatigue.   2. Initiating apnea therapy is recommended given the presence of symptoms and/or associated conditions. Recommend proceeding with one of the following:    a. Auto-CPAP therapy with a pressure range of  5-20cm H2O.    b. An oral appliance (OA) that can be obtained from certain dentists with expertise in sleep medicine. These are primarily of use in non-obese patients with mild and moderate disease.    c. An ENT consultation which may be useful to look for specific causes of obstruction and possible treatment options.    d. If patient is intolerant to PAP therapy, consider referral to ENT for evaluation for hypoglossal nerve stimulator.   3. Close follow-up is necessary to ensure success with CPAP or oral appliance therapy for maximum benefit .   4. A follow-up oximetry study on CPAP is recommended to assess the adequacy of therapy and determine the need for supplemental oxygen or the potential need for Bi-level therapy. An arterial blood gas to determine the adequacy of baseline ventilation and oxygenation should also be considered.   5. Healthy sleep recommendations include: adequate nightly sleep (normal 7-9 hrs/night), avoidance of caffeine after noon and alcohol near bedtime, and maintaining a sleep environment that is cool, dark and quiet.   6. Weight loss for overweight patients is recommended. Even modest amounts of weight loss can significantly improve the severity of sleep apnea.   7. Snoring recommendations include: weight loss where appropriate, side sleeping, and avoidance of alcohol before bed.   8. Operation of motor vehicle should not be performed when sleepy.   Signature: Electronically Signed: 05/20/21 Fransico Him, MD; [facility]; [place], American Board of Sleep Medicine

## 2021-05-20 ENCOUNTER — Ambulatory Visit: Payer: No Typology Code available for payment source

## 2021-05-20 ENCOUNTER — Encounter (HOSPITAL_BASED_OUTPATIENT_CLINIC_OR_DEPARTMENT_OTHER): Payer: No Typology Code available for payment source | Admitting: Cardiology

## 2021-05-20 DIAGNOSIS — G4733 Obstructive sleep apnea (adult) (pediatric): Secondary | ICD-10-CM

## 2021-05-20 DIAGNOSIS — C50211 Malignant neoplasm of upper-inner quadrant of right female breast: Secondary | ICD-10-CM

## 2021-05-20 DIAGNOSIS — Z170 Estrogen receptor positive status [ER+]: Secondary | ICD-10-CM

## 2021-05-20 NOTE — Progress Notes (Deleted)
    Sleep Study Report   Patient Information Study Date: 05/17/21 Patient Name: Grace Pennington Patient ID: [id number] Birth Date: 26-Aug-2070 Age: 50 Gender:Female Referring Physician: Glori Bickers, MD   TEST DESCRIPTION: Home sleep apnea testing was completed using the WatchPat, a Type 1 device, utilizing peripheral arterial tonometry (PAT), chest movement, actigraphy, pulse oximetry, pulse rate, body position and snore. AHI was calculated with apnea and hypopnea using valid sleep time as the denominator. RDI includes apneas, hypopneas, and RERAs. The data acquired and the scoring of sleep and all associated events were performed in accordance with the recommended standards and specifications as outlined in the AASM Manual for the Scoring of Sleep and Associated Events 2.2.0 (2015).   FINDINGS: 1. Moderate Obstructive Sleep Apnea with AHI 18.9/hr. 2. No Central Sleep Apnea with pAHIc 0/hr. 3. Oxygen desaturations as low as 77%. 4. Mild snoring was present. O2 sats were < 88% for 11.1 min. 5. Total sleep time was 6 hrs and 47 min. 6. 35.7% of total sleep time was spent in REM sleep. 7. Shortened sleep onset latency at 6 min 8. Prolonged REM sleep onset latency at 136 min. 9. Total awakenings were 4.   DIAGNOSIS: Moderate Obstructive Sleep Apnea (G47.33)   RECOMMENDATIONS:   1. Clinical correlation of these findings is necessary. The decision to treat obstructive sleep apnea (OSA) is usually based on the presence of apnea symptoms or the presence of associated medical conditions such as Hypertension, Congestive Heart Failure, Atrial Fibrillation or Obesity. The most common symptoms of OSA are snoring, gasping for breath while sleeping, daytime sleepiness and fatigue.   2. Initiating apnea therapy is recommended given the presence of symptoms and/or associated conditions. Recommend proceeding with one of the following:    a. Auto-CPAP therapy with a pressure range of 5-20cm  H2O.    b. An oral appliance (OA) that can be obtained from certain dentists with expertise in sleep medicine. These are primarily of use in non-obese patients with mild and moderate disease.    c. An ENT consultation which may be useful to look for specific causes of obstruction and possible treatment options.    d. If patient is intolerant to PAP therapy, consider referral to ENT for evaluation for hypoglossal nerve stimulator.   3. Close follow-up is necessary to ensure success with CPAP or oral appliance therapy for maximum benefit .   4. A follow-up oximetry study on CPAP is recommended to assess the adequacy of therapy and determine the need for supplemental oxygen or the potential need for Bi-level therapy. An arterial blood gas to determine the adequacy of baseline ventilation and oxygenation should also be considered.   5. Healthy sleep recommendations include: adequate nightly sleep (normal 7-9 hrs/night), avoidance of caffeine after noon and alcohol near bedtime, and maintaining a sleep environment that is cool, dark and quiet.   6. Weight loss for overweight patients is recommended. Even modest amounts of weight loss can significantly improve the severity of sleep apnea.   7. Snoring recommendations include: weight loss where appropriate, side sleeping, and avoidance of alcohol before bed.   8. Operation of motor vehicle should not be performed when sleepy.   Signature: Electronically Signed: 05/20/21 Fransico Him, MD; [facility]; [place], American Board of Sleep Medicine This encounter was created in error - please disregard.

## 2021-05-20 NOTE — Procedures (Signed)
     Sleep Study Report  Patient Information Study Date: 05/17/21 Patient Name: Grace Pennington Patient ID: [id number] Birth Date: 18-May-2071 Age: 50 Gender:Female Referring Physician: Glori Bickers, MD  TEST DESCRIPTION: Home sleep apnea testing was completed using the WatchPat, a Type 1 device, utilizing peripheral arterial tonometry (PAT), chest movement, actigraphy, pulse oximetry, pulse rate, body position and snore. AHI was calculated with apnea and hypopnea using valid sleep time as the denominator. RDI includes apneas, hypopneas, and RERAs. The data acquired and the scoring of sleep and all associated events were performed in accordance with the recommended standards and specifications as outlined in the AASM Manual for the Scoring of Sleep and Associated Events 2.2.0 (2015).  FINDINGS: 1. Moderate Obstructive Sleep Apnea with AHI 18.9/hr. 2. No Central Sleep Apnea with pAHIc 0/hr. 3. Oxygen desaturations as low as 77%. 4. Mild snoring was present. O2 sats were < 88% for 11.1 min. 5. Total sleep time was 6 hrs and 47 min. 6. 35.7% of total sleep time was spent in REM sleep. 7. Shortened sleep onset latency at 6 min 8. Prolonged REM sleep onset latency at 136 min. 9. Total awakenings were 4.  DIAGNOSIS: Moderate Obstructive Sleep Apnea (G47.33)  RECOMMENDATIONS:  1. Clinical correlation of these findings is necessary. The decision to treat obstructive sleep apnea (OSA) is usually based on the presence of apnea symptoms or the presence of associated medical conditions such as Hypertension, Congestive Heart Failure, Atrial Fibrillation or Obesity. The most common symptoms of OSA are snoring, gasping for breath while sleeping, daytime sleepiness and fatigue.  2. Initiating apnea therapy is recommended given the presence of symptoms and/or associated conditions. Recommend proceeding with one of the following:   a. Auto-CPAP therapy with a pressure range of 5-20cm H2O.    b. An oral appliance (OA) that can be obtained from certain dentists with expertise in sleep medicine. These are primarily of use in non-obese patients with mild and moderate disease.   c. An ENT consultation which may be useful to look for specific causes of obstruction and possible treatment options.   d. If patient is intolerant to PAP therapy, consider referral to ENT for evaluation for hypoglossal nerve stimulator.  3. Close follow-up is necessary to ensure success with CPAP or oral appliance therapy for maximum benefit .  4. A follow-up oximetry study on CPAP is recommended to assess the adequacy of therapy and determine the need for supplemental oxygen or the potential need for Bi-level therapy. An arterial blood gas to determine the adequacy of baseline ventilation and oxygenation should also be considered.  5. Healthy sleep recommendations include: adequate nightly sleep (normal 7-9 hrs/night), avoidance of caffeine after noon and alcohol near bedtime, and maintaining a sleep environment that is cool, dark and quiet.  6. Weight loss for overweight patients is recommended. Even modest amounts of weight loss can significantly improve the severity of sleep apnea.  7. Snoring recommendations include: weight loss where appropriate, side sleeping, and avoidance of alcohol before bed.  8. Operation of motor vehicle should not be performed when sleepy.  Signature: Electronically Signed: 05/20/21 Fransico Him, MD; [facility]; [place], American Board of Sleep Medicine

## 2021-05-22 ENCOUNTER — Other Ambulatory Visit: Payer: Self-pay

## 2021-05-22 ENCOUNTER — Telehealth: Payer: Self-pay | Admitting: *Deleted

## 2021-05-22 ENCOUNTER — Encounter: Payer: Self-pay | Admitting: Physical Therapy

## 2021-05-22 ENCOUNTER — Ambulatory Visit: Payer: No Typology Code available for payment source | Admitting: Physical Therapy

## 2021-05-22 DIAGNOSIS — I890 Lymphedema, not elsewhere classified: Secondary | ICD-10-CM

## 2021-05-22 DIAGNOSIS — R293 Abnormal posture: Secondary | ICD-10-CM

## 2021-05-22 DIAGNOSIS — G4733 Obstructive sleep apnea (adult) (pediatric): Secondary | ICD-10-CM

## 2021-05-22 NOTE — Telephone Encounter (Signed)
The patient has been notified of the result and verbalized understanding.  All questions (if any) were answered. Grace Pennington, [place] 05/22/2021 4:35 PM    Upon patient request DME selection is [facility] Patient understands he will be contacted by [place] to set up his cpap. Patient understands to call if [facility] does not contact him with new setup in a timely manner. Patient understands they will be called once confirmation has been received from [facility] that they have received their new machine to schedule 10 week follow up appointment.   [facility] notified of new cpap order  Please add to airview Patient was grateful for the call and thanked me

## 2021-05-22 NOTE — Telephone Encounter (Signed)
-----   Message from Sueanne Margarita, MD sent at 05/20/2021  7:07 PM EDT ----- Order ResMed CPAP on auto from 4 to 15cm H2O with heated humidity, mask of choice and get an overnight pulse ox on PAP therapy.  Followup in 6 weeks with me.

## 2021-05-22 NOTE — Therapy (Signed)
[facility] @ [place], [place], [address] Phone: [phone]   Fax:  [phone]  Physical Therapy Treatment  Patient Details  Name: Grace Pennington MRN: [id number] Date of Birth: 16-Dec-1970 Referring Provider (PT): Lindi Adie   Encounter Date: 05/22/2021   PT End of Session - 05/22/21 0857     Visit Number 5    Number of Visits 13    Date for PT Re-Evaluation 06/19/21    PT Start Time 0804    PT Stop Time 0853    PT Time Calculation (min) 49 min    Activity Tolerance Patient tolerated treatment well    Behavior During Therapy [facility] for tasks assessed/performed             Past Medical History:  Diagnosis Date   Anxiety    Breast cancer in female [facility])    Right   Depression    GERD (gastroesophageal reflux disease)    Hypertension    Palpitations    Sleep apnea     Past Surgical History:  Procedure Laterality Date   BREAST LUMPECTOMY WITH RADIOACTIVE SEED AND SENTINEL LYMPH NODE BIOPSY Right 10/16/2020   Procedure: RIGHT BREAST LUMPECTOMY WITH RADIOACTIVE SEED AND SENTINEL LYMPH NODE BIOPSY;  Surgeon: [name], MD;  Location: [place];  Service: General;  Laterality: Right;   PORTACATH PLACEMENT Left 10/16/2020   Procedure: INSERTION PORT-A-CATH;  Surgeon: [name], MD;  Location: [place];  Service: General;  Laterality: Left;   TUBAL LIGATION      There were no vitals filed for this visit.   Subjective Assessment - 05/22/21 0805     Subjective I got my sleeve and I have been wearing it for a week. It fits well and does not slide down.    Pertinent History Rtlumpectomy and SLNB on 10/16/20 with Dr. [name] 0/4 nodes positive due to ER+ breast cancer with chemotherapy and radiation, completed radiation on 9/7,  HTN    Patient Stated Goals to get the swelling down    Currently in Pain? No/denies    Pain Score 0-No pain                               [facility] Adult PT  Treatment/Exercise - 05/22/21 0001       Manual Therapy   Manual Lymphatic Drainage (MLD) in supine: short neck, 5 diaphragmatic breaths, left axillary nodes and establishment of interaxillary pathway, right inguinal nodes and establishment of axillo inguinal pathway, R UE working proximal to distal then retracing all steps                          PT Long Term Goals - 05/22/21 0856       PT LONG TERM GOAL #1   Title Pt will be independent in self MLD for long term management of lymphedema.    Baseline 05/22/21- still working on this    Time 4    Period Weeks    Status On-going      PT LONG TERM GOAL #2   Title Pt will obtain appropriate compression garments for long term management of lymphedema.    Baseline 05/22/21- pt received her sleeve but forgot to bring it in today- will assess fit at next session    Time 4    Period Weeks    Status Partially Met  PT LONG TERM GOAL #3   Title Pt will demonstrate a 1.5 cm decrease in edema 15 cm proximal to ulnar styloid process to decrease risk of cellulitis.    Baseline 33.2 cm    Time 4    Period Weeks    Status On-going                   Plan - 05/22/21 1027     Clinical Impression Statement Assessed pt's progress towards goals in therapy. Pt is progressing towards her goals and received her compression sleeve from DME supplier. She has been wearing it for a week. Educated pt about importance of wearing her sleeve 12 hours a day for 4 weeks to reverse subclinical lymphedema. Once pt has been in her sleeve for 4 weeks will reassess SOZO measurement to see if it has returned to the green. Pt would benefit from additional skilled PT visits to help reverse subclinical lymphedema.    PT Frequency 2x / week    PT Duration 4 weeks    PT Treatment/Interventions Therapeutic exercise;Patient/family education;Manual lymph drainage;ADLs/Self Care Home Management;Manual techniques;Passive range of motion;Compression  bandaging;Taping;Vasopneumatic Device    PT Next Visit Plan remeasure assess indep with self MLD, give lymphedema risk reduction handout, see if pt got a hold of a special place, re do sozo in 4 weeks    PT Home Exercise Plan post op; supine scapular series, self MLD    Consulted and Agree with Plan of Care Patient             Patient will benefit from skilled therapeutic intervention in order to improve the following deficits and impairments:  Increased edema, Decreased knowledge of precautions, Decreased knowledge of use of DME, Postural dysfunction  Visit Diagnosis: Lymphedema, not elsewhere classified  Abnormal posture     Problem List Patient Active Problem List   Diagnosis Date Noted   Port-A-Cath in place 11/20/2020   Malignant neoplasm of upper-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, estrogen receptor positive ([place]) 09/20/2020   Precordial pain 04/14/2014   Essential hypertension, benign 08/03/2013   Family history of ischemic heart disease 08/03/2013    Manus Gunning, PT 05/22/2021, 9:00 AM  [place] [facility] @ [place], [place], [address] Phone: [phone]   Fax:  [phone]  Name: Grace Pennington MRN: [id number] Date of Birth: [birth year]  Manus Gunning, PT 05/22/21 9:00 AM

## 2021-05-24 ENCOUNTER — Encounter: Payer: Self-pay | Admitting: Physical Therapy

## 2021-05-24 ENCOUNTER — Ambulatory Visit: Payer: No Typology Code available for payment source | Admitting: Physical Therapy

## 2021-05-24 ENCOUNTER — Other Ambulatory Visit: Payer: Self-pay

## 2021-05-24 DIAGNOSIS — R293 Abnormal posture: Secondary | ICD-10-CM

## 2021-05-24 DIAGNOSIS — I890 Lymphedema, not elsewhere classified: Secondary | ICD-10-CM

## 2021-05-24 NOTE — Therapy (Signed)
[facility] @ [place], [place], [address] Phone: [phone]   Fax:  [phone]  Physical Therapy Treatment  Patient Details  Name: Grace Pennington MRN: [id number] Date of Birth: [birth year] Referring Provider (PT): Lindi Adie   Encounter Date: 05/24/2021   PT End of Session - 05/24/21 0852     Visit Number 6    Number of Visits 13    Date for PT Re-Evaluation 06/19/21    PT Start Time 0805    PT Stop Time 0850    PT Time Calculation (min) 45 min    Activity Tolerance Patient tolerated treatment well    Behavior During Therapy [facility] for tasks assessed/performed             Past Medical History:  Diagnosis Date   Anxiety    Breast cancer in female [facility])    Right   Depression    GERD (gastroesophageal reflux disease)    Hypertension    Palpitations    Sleep apnea     Past Surgical History:  Procedure Laterality Date   BREAST LUMPECTOMY WITH RADIOACTIVE SEED AND SENTINEL LYMPH NODE BIOPSY Right 10/16/2020   Procedure: RIGHT BREAST LUMPECTOMY WITH RADIOACTIVE SEED AND SENTINEL LYMPH NODE BIOPSY;  Surgeon: [name], MD;  Location: [place];  Service: General;  Laterality: Right;   PORTACATH PLACEMENT Left 10/16/2020   Procedure: INSERTION PORT-A-CATH;  Surgeon: [name], MD;  Location: [place];  Service: General;  Laterality: Left;   TUBAL LIGATION      There were no vitals filed for this visit.   Subjective Assessment - 05/24/21 0806     Subjective I have been wearing my sleeve for 12 hours. I brought it in today.    Pertinent History Rtlumpectomy and SLNB on 10/16/20 with Dr. [name] 0/4 nodes positive due to ER+ breast cancer with chemotherapy and radiation, completed radiation on 9/7,  HTN    Patient Stated Goals to get the swelling down    Currently in Pain? No/denies    Pain Score 0-No pain                               [facility] Adult PT Treatment/Exercise - 05/24/21 0001        Manual Therapy   Manual Lymphatic Drainage (MLD) in supine: short neck, 5 diaphragmatic breaths, left axillary nodes and establishment of interaxillary pathway, right inguinal nodes and establishment of axillo inguinal pathway, R UE working proximal to distal then retracing all steps                          PT Long Term Goals - 05/22/21 0856       PT LONG TERM GOAL #1   Title Pt will be independent in self MLD for long term management of lymphedema.    Baseline 05/22/21- still working on this    Time 4    Period Weeks    Status On-going      PT LONG TERM GOAL #2   Title Pt will obtain appropriate compression garments for long term management of lymphedema.    Baseline 05/22/21- pt received her sleeve but forgot to bring it in today- will assess fit at next session    Time 4    Period Weeks    Status Partially Met      PT LONG TERM  GOAL #3   Title Pt will demonstrate a 1.5 cm decrease in edema 15 cm proximal to ulnar styloid process to decrease risk of cellulitis.    Baseline 33.2 cm    Time 4    Period Weeks    Status On-going                   Plan - 05/24/21 1324     Clinical Impression Statement Assessed pt's sleeve for fit and it fits well. Pt reports she is wearing it 12 hours a day. She will be on vacation Fri to [place] so educated pt to continue to wear sleeve 12 hours a day even on vacation because increased activity can cause increased fluid. Will reassess L dex score again next week. If it still has not gone down will reassess once pt has been in her sleeve for 4 weeks to see if subclincal lymphedema has been reversed.    PT Frequency 2x / week    PT Duration 4 weeks    PT Treatment/Interventions Therapeutic exercise;Patient/family education;Manual lymph drainage;ADLs/Self Care Home Management;Manual techniques;Passive range of motion;Compression bandaging;Taping;Vasopneumatic Device    PT Next Visit Plan remeasure assess indep with self  MLD, give lymphedema risk reduction handout, see if pt got a hold of a special place, re do sozo in 4 weeks    PT Home Exercise Plan post op; supine scapular series, self MLD, wear compression sleeve 12 hrs a day for 4 weeks    Consulted and Agree with Plan of Care Patient             Patient will benefit from skilled therapeutic intervention in order to improve the following deficits and impairments:  Increased edema, Decreased knowledge of precautions, Decreased knowledge of use of DME, Postural dysfunction  Visit Diagnosis: Lymphedema, not elsewhere classified  Abnormal posture     Problem List Patient Active Problem List   Diagnosis Date Noted   Port-A-Cath in place 11/20/2020   Malignant neoplasm of upper-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, estrogen receptor positive ([place]) 09/20/2020   Precordial pain 04/14/2014   Essential hypertension, benign 08/03/2013   Family history of ischemic heart disease 08/03/2013    Manus Gunning, PT 05/24/2021, 8:56 AM  [facility] @ [place], [place], [address] Phone: [phone]   Fax:  [phone]  Name: LAMIYA NAAS MRN: [id number] Date of Birth: June 30, 1971   Manus Gunning, PT 05/24/21 8:56 AM

## 2021-05-25 ENCOUNTER — Other Ambulatory Visit: Payer: No Typology Code available for payment source

## 2021-05-25 ENCOUNTER — Ambulatory Visit: Payer: No Typology Code available for payment source

## 2021-05-25 ENCOUNTER — Ambulatory Visit: Payer: No Typology Code available for payment source | Admitting: Hematology and Oncology

## 2021-05-29 ENCOUNTER — Other Ambulatory Visit (HOSPITAL_COMMUNITY): Payer: Self-pay

## 2021-05-29 ENCOUNTER — Ambulatory Visit: Payer: No Typology Code available for payment source | Attending: General Surgery | Admitting: Physical Therapy

## 2021-05-29 DIAGNOSIS — I890 Lymphedema, not elsewhere classified: Secondary | ICD-10-CM | POA: Insufficient documentation

## 2021-05-29 DIAGNOSIS — R293 Abnormal posture: Secondary | ICD-10-CM | POA: Insufficient documentation

## 2021-05-29 MED ORDER — PANTOPRAZOLE SODIUM 40 MG PO TBEC
40.0000 mg | DELAYED_RELEASE_TABLET | Freq: Every day | ORAL | 1 refills | Status: DC
  Filled 2021-05-29: qty 90, 90d supply, fill #0
  Filled 2021-09-07: qty 90, 90d supply, fill #1

## 2021-05-29 MED FILL — Hydrochlorothiazide Tab 25 MG: ORAL | 90 days supply | Qty: 90 | Fill #2 | Status: AC

## 2021-05-29 MED FILL — Citalopram Hydrobromide Tab 10 MG (Base Equiv): ORAL | 90 days supply | Qty: 90 | Fill #2 | Status: AC

## 2021-05-31 ENCOUNTER — Other Ambulatory Visit: Payer: Self-pay

## 2021-05-31 ENCOUNTER — Ambulatory Visit: Payer: No Typology Code available for payment source

## 2021-05-31 VITALS — Wt 261.2 lb

## 2021-05-31 DIAGNOSIS — R293 Abnormal posture: Secondary | ICD-10-CM

## 2021-05-31 DIAGNOSIS — I890 Lymphedema, not elsewhere classified: Secondary | ICD-10-CM

## 2021-05-31 NOTE — Therapy (Addendum)
[facility] @ [place], [place], [address] Phone: [phone]   Fax:  [phone]  Physical Therapy Treatment  Patient Details  Name: Grace Pennington MRN: [id number] Date of Birth: 04-28-1971 Referring Provider (PT): Lindi Adie   Encounter Date: 05/31/2021   PT End of Session - 05/31/21 0905     Visit Number 7    Number of Visits 13    Date for PT Re-Evaluation 06/19/21    PT Start Time 0805    PT Stop Time 0903    PT Time Calculation (min) 58 min    Activity Tolerance Patient tolerated treatment well    Behavior During Therapy [facility] for tasks assessed/performed             Past Medical History:  Diagnosis Date   Anxiety    Breast cancer in female [facility])    Right   Depression    GERD (gastroesophageal reflux disease)    Hypertension    Palpitations    Sleep apnea     Past Surgical History:  Procedure Laterality Date   BREAST LUMPECTOMY WITH RADIOACTIVE SEED AND SENTINEL LYMPH NODE BIOPSY Right 10/16/2020   Procedure: RIGHT BREAST LUMPECTOMY WITH RADIOACTIVE SEED AND SENTINEL LYMPH NODE BIOPSY;  Surgeon: [name], MD;  Location: [place];  Service: General;  Laterality: Right;   PORTACATH PLACEMENT Left 10/16/2020   Procedure: INSERTION PORT-A-CATH;  Surgeon: [name], MD;  Location: [place];  Service: General;  Laterality: Left;   TUBAL LIGATION      Vitals:   05/31/21 0809  Weight: 261 lb 4 oz (118.5 kg)     Subjective Assessment - 05/31/21 0810     Subjective I've been wearing my sleeve and that is going well. I'm also doing the exercises. Overall I am feeling better.    Pertinent History Rtlumpectomy and SLNB on 10/16/20 with Dr. [name] 0/4 nodes positive due to ER+ breast cancer with chemotherapy and radiation, completed radiation on 9/7,  HTN    Patient Stated Goals to get the swelling down    Currently in Pain? No/denies                [facility] PT Assessment - 05/31/21 0001        AROM   Right Shoulder Flexion 165 Degrees    Right Shoulder ABduction 172 Degrees                L-DEX FLOWSHEETS - 05/31/21 0800       L-DEX LYMPHEDEMA SCREENING   Measurement Type Unilateral    L-DEX MEASUREMENT EXTREMITY Upper Extremity    POSITION  Standing    DOMINANT SIDE Right    At Risk Side Right    BASELINE SCORE (UNILATERAL) 1    L-DEX SCORE (UNILATERAL) 7.1    VALUE CHANGE (UNILAT) 6.1                       [facility] Adult PT Treatment/Exercise - 05/31/21 0001       Manual Therapy   Myofascial Release To Rt axilla at 2 new cords pt started noticing over past week.    Manual Lymphatic Drainage (MLD) in supine: short neck, superficial and deep abdominals, left axillary nodes and establishment of anterior  inter-axillary pathway, right inguinal nodes and establishment of Rt axillo inguinal pathway, R UE working proximal to distal then retracing all steps    Passive ROM To Rt shoulder into  flexion and abduction for MFR                          PT Long Term Goals - 05/31/21 1631       PT LONG TERM GOAL #1   Title Pt will be independent in self MLD for long term management of lymphedema.    Baseline 05/22/21- still working on this; pt feels confident in this after review today - 05/31/21    Status Achieved      PT LONG TERM GOAL #2   Title Pt will obtain appropriate compression garments for long term management of lymphedema.    Baseline 05/22/21- pt received her sleeve but forgot to bring it in today- will assess fit at next session; pt has received this and been wearing daily and is a good fit - 05/31/21    Status Achieved      PT LONG TERM GOAL #3   Title Pt will demonstrate a 1.5 cm decrease in edema 15 cm proximal to ulnar styloid process to decrease risk of cellulitis.    Baseline 33.2 cm; did not remeasure circumference today but pts SOZO change from baseline did reduced below 6.5 to a new score of 6.1 - 05/31/21    Status  Partially Met                   Plan - 05/31/21 1638     Clinical Impression Statement Pt has progressed well meeting all goals, except for circumference reduction which wasn't measured today but her SOZO score was greatly reduced to 6.1. This determines she no longer has subclinical lymphedema so instructed her that she no longer needs to wear her compression sleeve daily, but did advise her it would be best to wear when she engages in highly repetitive activities. She verbalized good understanding and is ready for D/C at this time.    Personal Factors and Comorbidities Fitness    Examination-Activity Limitations Reach Overhead    Examination-Participation Restrictions Meal Prep;Yard Work    Stability/Clinical Decision Making Stable/Uncomplicated    Rehab Potential Good    PT Frequency 2x / week    PT Duration 4 weeks    PT Treatment/Interventions Therapeutic exercise;Patient/family education;Manual lymph drainage;ADLs/Self Care Home Management;Manual techniques;Passive range of motion;Compression bandaging;Taping;Vasopneumatic Device    PT Next Visit Plan D/C this visit and cont every 3 month L-Dex screens for up to 2 years from her SLNB (10/17/22).    PT Home Exercise Plan post op; supine scapular series, self MLD, wear compression sleeve 12 hrs a day for 4 weeks    Consulted and Agree with Plan of Care Patient             Patient will benefit from skilled therapeutic intervention in order to improve the following deficits and impairments:  Increased edema, Decreased knowledge of precautions, Decreased knowledge of use of DME, Postural dysfunction  Visit Diagnosis: Lymphedema, not elsewhere classified  Abnormal posture     Problem List Patient Active Problem List   Diagnosis Date Noted   Port-A-Cath in place 11/20/2020   Malignant neoplasm of upper-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, estrogen receptor positive ([place]) 09/20/2020   Precordial pain 04/14/2014    Essential hypertension, benign 08/03/2013   Family history of ischemic heart disease 08/03/2013    Grace Pennington, PTA 05/31/2021, 4:53 PM  [facility] @ [name], [place], [address] Phone:  [phone]   Fax:  [phone]  Name: Grace Pennington MRN: [id number] Date of Birth: 21-Jun-1971  PHYSICAL THERAPY DISCHARGE SUMMARY  Visits from Start of Care: 7  Current functional level related to goals / functional outcomes: Goals met or partially met   Remaining deficits: None   Education / Equipment: MLD, compression garments   Patient agrees to discharge. Patient goals were met. Patient is being discharged due to meeting the stated rehab goals.  Allyson Sabal [place], [place] 06/04/21 1:44 PM

## 2021-06-01 ENCOUNTER — Other Ambulatory Visit (HOSPITAL_COMMUNITY): Payer: Self-pay

## 2021-06-06 NOTE — Progress Notes (Signed)
Patient Care Team: Seward Carol, MD as PCP - General (Internal Medicine) Nicholas Lose, MD as Consulting Physician (Hematology and Oncology) [name], MD as Consulting Physician (General Surgery) Kyung Rudd, MD as Consulting Physician (Radiation Oncology) Armandina Stammer, DO as Consulting Physician (Obstetrics and Gynecology)  DIAGNOSIS:    ICD-10-CM   1. Malignant neoplasm of upper-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, estrogen receptor positive ([place])  C50.211 MM DIAG BREAST TOMO BILATERAL   Z17.0       SUMMARY OF ONCOLOGIC HISTORY: Oncology History  Malignant neoplasm of upper-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, estrogen receptor positive ([place])  09/08/2020 Cancer Staging   Staging form: Breast, AJCC 8th Edition - Clinical stage from 09/08/2020: Stage IA (cT1b, cN0, cM0, G2, ER+, PR+, HER2+) - Signed by Gardenia Phlegm, NP on 09/20/2020 Stage prefix: Initial diagnosis    09/08/2020 Initial Diagnosis   Mammogram detected 0.7 cm mass in the right breast 1 o'clock position, right breast biopsy 1: Grade 2 IDC ER 95% PR 95%, Ki-67 30%, HER-2 positive ratio 2.83   10/16/2020 Surgery   Right lumpectomy [name]): IDC, 1.0cm, grade 3, with high grade DCIS, clear margins, 4 right axillary lymph nodes negative for carcinoma.   10/16/2020 Cancer Staging   Staging form: Breast, AJCC 8th Edition - Pathologic stage from 10/16/2020: Stage IA (pT1b, pN0, cM0, G3, ER+, PR+, HER2+) - Signed by Gardenia Phlegm, NP on 11/01/2020 Histologic grading system: 3 grade system    11/13/2020 -  Chemotherapy   Patient is on Treatment Plan : BREAST Paclitaxel + Trastuzumab q7d / Trastuzumab q21d       CHIEF COMPLIANT: Cycle 7 Herceptin  INTERVAL HISTORY: Grace Pennington is a 50 y.o. with above-mentioned history of right breast cancer who underwent a right lumpectomy and is currently on Herceptin. She presents to the clinic today for treatment.  She is tolerating Herceptin  extremely well without any problems or concerns.  We had a break in treatment because of decrease in cardiac ejection fraction but with the stoppage it got better.  So she is able to resume her treatment.  ALLERGIES:  has No Known Allergies.  MEDICATIONS:  Current Outpatient Medications  Medication Sig Dispense Refill   ALPRAZolam (XANAX) 0.25 MG tablet Take 1 tablet by mouth once a day if needed 30 tablet 3   carvedilol (COREG) 3.125 MG tablet Take 1 tablet (3.125 mg total) by mouth 2 (two) times daily. 60 tablet 3   citalopram (CELEXA) 10 MG tablet TAKE 1 TABLET BY MOUTH ONCE DAILY 90 tablet 3   fluticasone (FLONASE) 50 MCG/ACT nasal spray Place 2 sprays into both nostrils daily as needed for allergies.     hydrochlorothiazide (HYDRODIURIL) 25 MG tablet TAKE 1 TABLET BY MOUTH ONCE DAILY 90 tablet 3   hydroxypropyl methylcellulose / hypromellose (ISOPTO TEARS / GONIOVISC) 2.5 % ophthalmic solution Place 1 drop into both eyes 3 (three) times daily as needed (dry/irritated eyes). (Patient not taking: Reported on 06/08/2021)     influenza vac split quadrivalent PF (FLUARIX) 0.5 ML injection Inject 0.5 mLs into the muscle. 0.5 mL 0   lidocaine-prilocaine (EMLA) cream APPLY TO AFFECTED AREA ONCE 30 g 3   loratadine (CLARITIN) 10 MG tablet Take 10 mg by mouth daily as needed for allergies.     losartan (COZAAR) 25 MG tablet Take 1 tablet (25 mg total) by mouth daily. 90 tablet 2   metFORMIN (GLUCOPHAGE) 1000 MG tablet Take 1,000 mg by mouth  2 (two) times daily with a meal.     metFORMIN (GLUCOPHAGE) 1000 MG tablet Take 1,000 mg by mouth 2 (two) times daily with a meal.     Multiple Vitamins-Minerals (MULTIVITAMIN WITH MINERALS) tablet Take 1 tablet by mouth daily.     pantoprazole (PROTONIX) 40 MG tablet TAKE 1 TABLET BY MOUTH DAILY 90 tablet 1   rosuvastatin (CRESTOR) 10 MG tablet TAKE 1 TABLET BY MOUTH ONCE A DAY 90 tablet 3   spironolactone (ALDACTONE) 50 MG tablet Take 50 mg by mouth daily.  (Patient not taking: Reported on 06/08/2021)     No current facility-administered medications for this visit.    PHYSICAL EXAMINATION: ECOG PERFORMANCE STATUS: 1 - Symptomatic but completely ambulatory  Vitals:   06/08/21 1119  BP: 105/69  Pulse: 66  Resp: 16  Temp: (!) 97.5 F (36.4 C)  SpO2: 97%   Filed Weights   06/08/21 1119  Weight: 260 lb (117.9 kg)      LABORATORY DATA:  I have reviewed the data as listed CMP Latest Ref Rng & Units 05/18/2021 02/23/2021 02/02/2021  Glucose 70 - 99 mg/dL 120(H) 124(H) 118(H)  BUN 6 - 20 mg/dL _0 Creatinine 0.44 - 1.00 mg/dL 0.84 1.07(H) 1.01(H)  Sodium 135 - 145 mmol/L 139 140 139  Potassium 3.5 - 5.1 mmol/L 3.5 4.2 3.5  Chloride 98 - 111 mmol/L 105 105 103  CO2 22 - 32 mmol/L _1 Calcium 8.9 - 10.3 mg/dL 9.4 9.9 9.9  Total Protein 6.5 - 8.1 g/dL 7.7 7.4 7.4  Total Bilirubin 0.3 - 1.2 mg/dL 0.3 0.4 0.4  Alkaline Phos 38 - 126 U/L 62 68 56  AST 15 - 41 U/L _2 ALT 0 - 44 U/L _3 Lab Results  Component Value Date   WBC 5.0 06/08/2021   HGB 13.0 06/08/2021   HCT 39.8 06/08/2021   MCV 86.1 06/08/2021   PLT 254 06/08/2021   NEUTROABS 2.4 06/08/2021    ASSESSMENT & PLAN:  Malignant neoplasm of upper-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, estrogen receptor positive ([facility]) 09/08/2020:Mammogram detected 0.7 cm mass in the right breast 1 o'clock position, right breast biopsy 1: Grade 2 IDC ER 95% PR 95%, Ki-67 30%, HER-2 positive ratio 2.83 T1b N0 stage Ia   10/16/20: Grade 3 IDC with DCIS 0/4 LN  ER 95% PR 95%, Ki-67 30%, HER-2 positive ratio 2.83   Treatment Plan: 1. adjuvant Taxol Herceptin followed by Herceptin maintenance for 1 year plan to start in 3 weeks completed 02/02/2021 2.  Adjuvant radiation completed 04/04/2021 3.  Followed by adjuvant antiestrogen therapy -------------------------------------------------------------------------------------------------------------------------- Current  treatment: Herceptin maintenance to be completed 12/14/2021 Toxicities: None Echocardiogram 05/17/2021: EF 55% Return to clinic every 3 weeks for Herceptin every 6 weeks for follow-up with me.  When she returns we will discuss starting antiestrogen therapy. Mammograms will be in January 2023.    Orders Placed This Encounter  Procedures   MM DIAG BREAST TOMO BILATERAL    Standing Status:   Future    Standing Expiration Date:   06/08/2022    Order Specific Question:   Reason for Exam (SYMPTOM  OR DIAGNOSIS REQUIRED)    Answer:   2021 breast cancer S/P lumpectomy    Order Specific Question:   Is the patient pregnant?    Answer:   No    Order Specific Question:   Preferred imaging location?    [facility]    Order Specific Question:   Release to patient    Answer:   Immediate    The patient has a good understanding of the overall plan. she agrees with it. she will call with any problems that may develop before the next visit here.  Total time spent: 30 mins including face to face time and time spent for planning, charting and coordination of care  Rulon Eisenmenger, MD, MPH 06/08/2021  I, Thana Ates, am acting as scribe for Dr. Nicholas Lose.  I have reviewed the above documentation for accuracy and completeness, and I agree with the above.

## 2021-06-08 ENCOUNTER — Inpatient Hospital Stay: Payer: No Typology Code available for payment source

## 2021-06-08 ENCOUNTER — Other Ambulatory Visit: Payer: Self-pay

## 2021-06-08 ENCOUNTER — Encounter: Payer: Self-pay | Admitting: *Deleted

## 2021-06-08 ENCOUNTER — Encounter: Payer: Self-pay | Admitting: Dietician

## 2021-06-08 ENCOUNTER — Inpatient Hospital Stay
Payer: No Typology Code available for payment source | Attending: Hematology and Oncology | Admitting: Hematology and Oncology

## 2021-06-08 ENCOUNTER — Encounter: Payer: No Typology Code available for payment source | Admitting: Dietician

## 2021-06-08 DIAGNOSIS — Z95828 Presence of other vascular implants and grafts: Secondary | ICD-10-CM

## 2021-06-08 DIAGNOSIS — E119 Type 2 diabetes mellitus without complications: Secondary | ICD-10-CM | POA: Insufficient documentation

## 2021-06-08 DIAGNOSIS — C50211 Malignant neoplasm of upper-inner quadrant of right female breast: Secondary | ICD-10-CM | POA: Insufficient documentation

## 2021-06-08 DIAGNOSIS — Z5112 Encounter for antineoplastic immunotherapy: Secondary | ICD-10-CM | POA: Diagnosis present

## 2021-06-08 DIAGNOSIS — Z170 Estrogen receptor positive status [ER+]: Secondary | ICD-10-CM

## 2021-06-08 LAB — CMP (CANCER CENTER ONLY)
ALT: 24 U/L (ref 0–44)
AST: 18 U/L (ref 15–41)
Albumin: 3.8 g/dL (ref 3.5–5.0)
Alkaline Phosphatase: 66 U/L (ref 38–126)
Anion gap: 8 (ref 5–15)
BUN: 15 mg/dL (ref 6–20)
CO2: 27 mmol/L (ref 22–32)
Calcium: 9.2 mg/dL (ref 8.9–10.3)
Chloride: 106 mmol/L (ref 98–111)
Creatinine: 0.94 mg/dL (ref 0.44–1.00)
GFR, Estimated: >60 mL/min (ref >60)
Glucose, Bld: 85 mg/dL (ref 70–99)
Potassium: 3.8 mmol/L (ref 3.5–5.1)
Sodium: 141 mmol/L (ref 135–145)
Total Bilirubin: 0.4 mg/dL (ref 0.3–1.2)
Total Protein: 7.5 g/dL (ref 6.5–8.1)

## 2021-06-08 LAB — CBC WITH DIFFERENTIAL (CANCER CENTER ONLY)
Abs Immature Granulocytes: 0.01 10*3/uL (ref 0.00–0.07)
Basophils Absolute: 0 10*3/uL (ref 0.0–0.1)
Basophils Relative: 1 %
Eosinophils Absolute: 0.4 10*3/uL (ref 0.0–0.5)
Eosinophils Relative: 7 %
HCT: 39.8 % (ref 36.0–46.0)
Hemoglobin: 13 g/dL (ref 12.0–15.0)
Immature Granulocytes: 0 %
Lymphocytes Relative: 34 %
Lymphs Abs: 1.7 10*3/uL (ref 0.7–4.0)
MCH: 28.1 pg (ref 26.0–34.0)
MCHC: 32.7 g/dL (ref 30.0–36.0)
MCV: 86.1 fL (ref 80.0–100.0)
Monocytes Absolute: 0.5 10*3/uL (ref 0.1–1.0)
Monocytes Relative: 11 %
Neutro Abs: 2.4 10*3/uL (ref 1.7–7.7)
Neutrophils Relative %: 47 %
Platelet Count: 254 10*3/uL (ref 150–400)
RBC: 4.62 MIL/uL (ref 3.87–5.11)
RDW: 13.8 % (ref 11.5–15.5)
WBC Count: 5 10*3/uL (ref 4.0–10.5)
nRBC: 0 % (ref 0.0–0.2)

## 2021-06-08 MED ORDER — HEPARIN SOD (PORK) LOCK FLUSH 100 UNIT/ML IV SOLN
500.0000 [IU] | Freq: Once | INTRAVENOUS | Status: AC | PRN
  Administered 2021-06-08: 500 [IU]

## 2021-06-08 MED ORDER — ACETAMINOPHEN 325 MG PO TABS
650.0000 mg | ORAL_TABLET | Freq: Once | ORAL | Status: AC
  Administered 2021-06-08: 650 mg via ORAL
  Filled 2021-06-08: qty 2

## 2021-06-08 MED ORDER — DIPHENHYDRAMINE HCL 25 MG PO CAPS
50.0000 mg | ORAL_CAPSULE | Freq: Once | ORAL | Status: AC
  Administered 2021-06-08: 50 mg via ORAL
  Filled 2021-06-08: qty 2

## 2021-06-08 MED ORDER — TRASTUZUMAB-DKST CHEMO 150 MG IV SOLR
6.0000 mg/kg | Freq: Once | INTRAVENOUS | Status: AC
  Administered 2021-06-08: 693 mg via INTRAVENOUS
  Filled 2021-06-08: qty 33

## 2021-06-08 MED ORDER — SODIUM CHLORIDE 0.9% FLUSH
10.0000 mL | Freq: Once | INTRAVENOUS | Status: AC
  Administered 2021-06-08: 10 mL

## 2021-06-08 MED ORDER — SODIUM CHLORIDE 0.9% FLUSH
10.0000 mL | INTRAVENOUS | Status: DC | PRN
  Administered 2021-06-08: 10 mL

## 2021-06-08 MED ORDER — SODIUM CHLORIDE 0.9 % IV SOLN: Freq: Once | INTRAVENOUS | Status: AC

## 2021-06-08 NOTE — Patient Instructions (Addendum)
You tube Dondra Prader recipes Be A Plant-Based Woman Warrior: Live [place], Stay [place], Eat Delicious Paperback - March 20, 2021 by Nicola Girt  (Author), Lorraine Lax Esselstyn Artist) - find them on you tube    Continue to stay active Continue to take your medication Increase your plants Mindful eating

## 2021-06-08 NOTE — Progress Notes (Signed)
Diabetes Self-Management Education  Visit Type: First/Initial  Appt. Start Time: 0910 Appt. End Time:1030  06/08/2021  [name], identified by name and date of birth, is a 50 y.o. female with a diagnosis of Diabetes: Type 2.   ASSESSMENT Patient is here today alone.  History includes:  Type 2 Diabetes (12/2020 - diagnosed during chemo), Breast cancer (completed radiation but is completing chemo once per month). OSA - being measured for c-pap, lymphedema, GERD, HTN Sleep: Good 7-9 hours per night. Medications include:  Metformin, MVI Labs:  A1C 8.5% 12/26/2020, increased from 6.3% 2021, Cholesterol 226, HDL 57 07/17/2020  Weight hx: 260 lbs 06/07/2021 Stable  highest adult weight Gained weight after last son, menopause, mom passed 165 lbs lowest adult weight She was going to healthy weight and wellness.  Patient lives with her 50 yo.  Patient works from home in [place] for [employer].  She is now in [place]. Walks every night with her Husky.  Height 5\' 6"  (1.676 m), weight 260 lb (117.9 kg), last menstrual period 01/13/2018. Body mass index is 41.97 kg/m.   Diabetes Self-Management Education - 06/08/21 0923       Visit Information   Visit Type First/Initial      Initial Visit   Diabetes Type Type 2    Are you currently following a meal plan? No    Are you taking your medications as prescribed? Yes    Date Diagnosed 12/2020      Health Coping   How would you rate your overall health? Good      Psychosocial Assessment   Patient Belief/Attitude about Diabetes Motivated to manage diabetes    Self-care barriers None    Self-management support Doctor's office    Other persons present Patient    Patient Concerns Nutrition/Meal planning;Weight Control    Special Needs None    Preferred Learning Style No preference indicated    Learning Readiness Ready    How often do you need to have someone help you when you read instructions, pamphlets, or  other written materials from your doctor or pharmacy? 1 - Never    What is the last grade level you completed in school? some college      Pre-Education Assessment   Patient understands the diabetes disease and treatment process. Needs Instruction    Patient understands incorporating nutritional management into lifestyle. Needs Instruction    Patient undertands incorporating physical activity into lifestyle. Needs Instruction    Patient understands using medications safely. Needs Instruction    Patient understands monitoring blood glucose, interpreting and using results Needs Instruction    Patient understands prevention, detection, and treatment of acute complications. Needs Instruction    Patient understands prevention, detection, and treatment of chronic complications. Needs Instruction    Patient understands how to develop strategies to address psychosocial issues. Needs Instruction    Patient understands how to develop strategies to promote health/change behavior. Needs Instruction      Complications   Last HgB A1C per patient/outside source 8.5 %   11/2020   How often do you check your blood sugar? 1-2 times/day    Fasting Blood glucose range (mg/dL) 70-129    Postprandial Blood glucose range (mg/dL) 70-129    Number of hypoglycemic episodes per month 0    Number of hyperglycemic episodes per week 0    Have you had a dilated eye exam in the past 12 months? Yes    Have you had a dental exam  in the past 12 months? Yes    Are you checking your feet? Yes    How many days per week are you checking your feet? 7      Dietary Intake   Breakfast eggs with cheese, pear    Snack (morning) similar to afternoon    Lunch grilled ham and cheese or egg salad sandwich, veggie chips    Snack (afternoon) skinny pop or sunflower seed butter on apple, crackers, or celery OR pepperoni and cheese    Dinner chicken, vegetables, sweet potato or baked potato or brown rice    Snack (evening) similar to  afternoon    Beverage(s) water, coffee occasionally with creamer, sometimes with honey      Exercise   Exercise Type Light (walking / raking leaves)    How many days per week to you exercise? 7    How many minutes per day do you exercise? 45    Total minutes per week of exercise 315      Patient Education   Previous Diabetes Education No    Disease state  Definition of diabetes, type 1 and 2, and the diagnosis of diabetes    Nutrition management  Role of diet in the treatment of diabetes and the relationship between the three main macronutrients and blood glucose level;Food label reading, portion sizes and measuring food.;Meal options for control of blood glucose level and chronic complications.;Other (comment)   plant based nutrition   Physical activity and exercise  Role of exercise on diabetes management, blood pressure control and cardiac health.    Medications Reviewed patients medication for diabetes, action, purpose, timing of dose and side effects.    Monitoring Identified appropriate SMBG and/or A1C goals.    Acute complications Taught treatment of hypoglycemia - the 15 rule.;Discussed and identified patients' treatment of hyperglycemia.    Chronic complications Relationship between chronic complications and blood glucose control;Retinopathy and reason for yearly dilated eye exams    Psychosocial adjustment Identified and addressed patients feelings and concerns about diabetes;Role of stress on diabetes      Individualized Goals (developed by patient)   Nutrition General guidelines for healthy choices and portions discussed    Physical Activity Exercise 5-7 days per week;45 minutes per day    Medications take my medication as prescribed    Monitoring  test my blood glucose as discussed    Reducing Risk examine blood glucose patterns      Post-Education Assessment   Patient understands the diabetes disease and treatment process. Demonstrates understanding / competency    Patient  understands incorporating nutritional management into lifestyle. Demonstrates understanding / competency    Patient undertands incorporating physical activity into lifestyle. Demonstrates understanding / competency    Patient understands using medications safely. Demonstrates understanding / competency    Patient understands monitoring blood glucose, interpreting and using results Demonstrates understanding / competency    Patient understands prevention, detection, and treatment of acute complications. Demonstrates understanding / competency    Patient understands prevention, detection, and treatment of chronic complications. Demonstrates understanding / competency    Patient understands how to develop strategies to address psychosocial issues. Demonstrates understanding / competency    Patient understands how to develop strategies to promote health/change behavior. Demonstrates understanding / competency      Outcomes   Expected Outcomes Demonstrated interest in learning. Expect positive outcomes    Future DMSE PRN    Program Status Completed  Individualized Plan for Diabetes Self-Management Training:   Learning Objective:  Patient will have a greater understanding of diabetes self-management. Patient education plan is to attend individual and/or group sessions per assessed needs and concerns.   Plan:   Patient Instructions  You tube Dondra Prader recipes Be A Plant-Based Woman Warrior: Live [place], Stay [place], Eat Delicious Paperback - March 20, 2021 by Nicola Girt  (Author), Lorraine Lax Essel[place]yn Artist) - find them on you tube    Continue to stay active Continue to take your medication Increase your plants Mindful eating  Expected Outcomes:  Demonstrated interest in learning. Expect positive outcomes  Education material provided: ADA - How to Thrive: A Guide for Your Journey with Diabetes, Food label handouts, Meal plan card, Snack sheet, and Diabetes  Resources  If problems or questions, patient to contact team via:  Phone  Future DSME appointment: PRN

## 2021-06-08 NOTE — Assessment & Plan Note (Signed)
09/08/2020:Mammogram detected 0.7 cm mass in the right breast 1 o'clock position, right breast biopsy 1: Grade 2 IDC ER 95% PR 95%, Ki-67 30%, HER-2 positive ratio 2.83 T1b N0 stage Ia  10/16/20: Grade 3 IDC with DCIS 0/4 LNER 95% PR 95%, Ki-67 30%, HER-2 positive ratio 2.83  Treatment Plan: 1.adjuvant Taxol Herceptin followed by Herceptin maintenance for 1 yearplan to start in 3 weeks completed 02/02/2021 2.Adjuvant radiation 3.Followed by adjuvant antiestrogen therapy -------------------------------------------------------------------------------------------------------------------------- Current treatment: Herceptin maintenance Toxicities: None Echocardiogram 05/17/2021: EF 55% Return to clinic every 3 weeks for Herceptin every 6 weeks for follow-up with me.

## 2021-06-08 NOTE — Patient Instructions (Signed)
[facility]   Discharge Instructions: Thank you for choosing [facility] to provide your oncology and hematology care.   If you have a lab appointment with the Cancer Center, please go directly to the Cancer Center and check in at the registration area.   Wear comfortable clothing and clothing appropriate for easy access to any Portacath or PICC line.   We strive to give you quality time with your provider. You may need to reschedule your appointment if you arrive late (15 or more minutes).  Arriving late affects you and other patients whose appointments are after yours.  Also, if you miss three or more appointments without notifying the office, you may be dismissed from the clinic at the provider's discretion.      For prescription refill requests, have your pharmacy contact our office and allow 72 hours for refills to be completed.    Today you received the following chemotherapy and/or immunotherapy agents: trastuzumab-dkst.      To help prevent nausea and vomiting after your treatment, we encourage you to take your nausea medication as directed.  BELOW ARE SYMPTOMS THAT SHOULD BE REPORTED IMMEDIATELY: *FEVER GREATER THAN 100.4 F (38 C) OR HIGHER *CHILLS OR SWEATING *NAUSEA AND VOMITING THAT IS NOT CONTROLLED WITH YOUR NAUSEA MEDICATION *UNUSUAL SHORTNESS OF BREATH *UNUSUAL BRUISING OR BLEEDING *URINARY PROBLEMS (pain or burning when urinating, or frequent urination) *BOWEL PROBLEMS (unusual diarrhea, constipation, pain near the anus) TENDERNESS IN MOUTH AND THROAT WITH OR WITHOUT PRESENCE OF ULCERS (sore throat, sores in mouth, or a toothache) UNUSUAL RASH, SWELLING OR PAIN  UNUSUAL VAGINAL DISCHARGE OR ITCHING   Items with * indicate a potential emergency and should be followed up as soon as possible or go to the Emergency Department if any problems should occur.  Please show the CHEMOTHERAPY ALERT CARD or IMMUNOTHERAPY ALERT CARD at  check-in to the Emergency Department and triage nurse.  Should you have questions after your visit or need to cancel or reschedule your appointment, please contact [facility]  Dept: [phone]  and follow the prompts.  Office hours are 8:00 a.m. to 4:30 p.m. Monday - Friday. Please note that voicemails left after 4:00 p.m. may not be returned until the following business day.  We are closed weekends and major holidays. You have access to a nurse at all times for urgent questions. Please call the main number to the clinic Dept: [phone] and follow the prompts.   For any non-urgent questions, you may also contact your provider using MyChart. We now offer e-Visits for anyone 18 and older to request care online for non-urgent symptoms. For details visit [email or web address].   Also download the MyChart app! Go to the app store, search "MyChart", open the app, select [facility], and log in with your MyChart username and password.  Due to Covid, a mask is required upon entering the hospital/clinic. If you do not have a mask, one will be given to you upon arrival. For doctor visits, patients may have 1 support person aged 18 or older with them. For treatment visits, patients cannot have anyone with them due to current Covid guidelines and our immunocompromised population.    

## 2021-06-11 ENCOUNTER — Telehealth: Payer: Self-pay | Admitting: Hematology and Oncology

## 2021-06-11 NOTE — Telephone Encounter (Signed)
Scheduled appointment per 11/14 los. Patient aware.

## 2021-06-12 ENCOUNTER — Telehealth: Payer: Self-pay | Admitting: *Deleted

## 2021-06-12 NOTE — Telephone Encounter (Signed)
"  [employer] [place] for [employer] requested PHI (protected health information) for purpose of TPO (treatment, payment and healthcare operations) in support of treatment and payment include results of recent office visit notes of 05/18/2021."      Grace Pennington not seen by provider before 05/18/2021 treatment.  Most recent OV 06/08/2021 provided.

## 2021-06-14 ENCOUNTER — Encounter: Payer: Self-pay | Admitting: *Deleted

## 2021-06-28 ENCOUNTER — Other Ambulatory Visit: Payer: Self-pay

## 2021-06-28 ENCOUNTER — Inpatient Hospital Stay: Payer: No Typology Code available for payment source

## 2021-06-28 ENCOUNTER — Inpatient Hospital Stay: Payer: No Typology Code available for payment source | Attending: Hematology and Oncology

## 2021-06-28 VITALS — BP 119/87 | HR 66 | Temp 98.2°F | Resp 18

## 2021-06-28 DIAGNOSIS — Z5112 Encounter for antineoplastic immunotherapy: Secondary | ICD-10-CM | POA: Insufficient documentation

## 2021-06-28 DIAGNOSIS — C50211 Malignant neoplasm of upper-inner quadrant of right female breast: Secondary | ICD-10-CM

## 2021-06-28 DIAGNOSIS — Z170 Estrogen receptor positive status [ER+]: Secondary | ICD-10-CM

## 2021-06-28 DIAGNOSIS — Z95828 Presence of other vascular implants and grafts: Secondary | ICD-10-CM

## 2021-06-28 LAB — CMP (CANCER CENTER ONLY)
ALT: 23 U/L (ref 0–44)
AST: 19 U/L (ref 15–41)
Albumin: 3.8 g/dL (ref 3.5–5.0)
Alkaline Phosphatase: 67 U/L (ref 38–126)
Anion gap: 9 (ref 5–15)
BUN: 14 mg/dL (ref 6–20)
CO2: 26 mmol/L (ref 22–32)
Calcium: 9 mg/dL (ref 8.9–10.3)
Chloride: 106 mmol/L (ref 98–111)
Creatinine: 1.02 mg/dL — ABNORMAL HIGH (ref 0.44–1.00)
GFR, Estimated: >60 mL/min (ref >60)
Glucose, Bld: 101 mg/dL — ABNORMAL HIGH (ref 70–99)
Potassium: 4 mmol/L (ref 3.5–5.1)
Sodium: 141 mmol/L (ref 135–145)
Total Bilirubin: 0.3 mg/dL (ref 0.3–1.2)
Total Protein: 7.5 g/dL (ref 6.5–8.1)

## 2021-06-28 LAB — CBC WITH DIFFERENTIAL (CANCER CENTER ONLY)
Abs Immature Granulocytes: 0.01 10*3/uL (ref 0.00–0.07)
Basophils Absolute: 0 10*3/uL (ref 0.0–0.1)
Basophils Relative: 1 %
Eosinophils Absolute: 0.3 10*3/uL (ref 0.0–0.5)
Eosinophils Relative: 6 %
HCT: 40.4 % (ref 36.0–46.0)
Hemoglobin: 13 g/dL (ref 12.0–15.0)
Immature Granulocytes: 0 %
Lymphocytes Relative: 28 %
Lymphs Abs: 1.4 10*3/uL (ref 0.7–4.0)
MCH: 27.5 pg (ref 26.0–34.0)
MCHC: 32.2 g/dL (ref 30.0–36.0)
MCV: 85.6 fL (ref 80.0–100.0)
Monocytes Absolute: 0.5 10*3/uL (ref 0.1–1.0)
Monocytes Relative: 10 %
Neutro Abs: 2.9 10*3/uL (ref 1.7–7.7)
Neutrophils Relative %: 55 %
Platelet Count: 230 10*3/uL (ref 150–400)
RBC: 4.72 MIL/uL (ref 3.87–5.11)
RDW: 14 % (ref 11.5–15.5)
WBC Count: 5.1 10*3/uL (ref 4.0–10.5)
nRBC: 0 % (ref 0.0–0.2)

## 2021-06-28 MED ORDER — SODIUM CHLORIDE 0.9 % IV SOLN: Freq: Once | INTRAVENOUS | Status: AC

## 2021-06-28 MED ORDER — TRASTUZUMAB-DKST CHEMO 150 MG IV SOLR
6.0000 mg/kg | Freq: Once | INTRAVENOUS | Status: AC
  Administered 2021-06-28: 693 mg via INTRAVENOUS
  Filled 2021-06-28: qty 33

## 2021-06-28 MED ORDER — DIPHENHYDRAMINE HCL 25 MG PO CAPS
50.0000 mg | ORAL_CAPSULE | Freq: Once | ORAL | Status: AC
  Administered 2021-06-28: 50 mg via ORAL
  Filled 2021-06-28: qty 2

## 2021-06-28 MED ORDER — ACETAMINOPHEN 325 MG PO TABS
650.0000 mg | ORAL_TABLET | Freq: Once | ORAL | Status: AC
  Administered 2021-06-28: 650 mg via ORAL
  Filled 2021-06-28: qty 2

## 2021-06-28 MED ORDER — SODIUM CHLORIDE 0.9% FLUSH
10.0000 mL | Freq: Once | INTRAVENOUS | Status: AC
Start: 2021-06-28 — End: 2021-06-28
  Administered 2021-06-28: 10 mL

## 2021-06-28 NOTE — Progress Notes (Signed)
Ok to treat per Dr. Lindi Adie with echo from 10/22.

## 2021-06-28 NOTE — Patient Instructions (Signed)
[facility]   Discharge Instructions: Thank you for choosing [facility] to provide your oncology and hematology care.   If you have a lab appointment with the Cancer Center, please go directly to the Cancer Center and check in at the registration area.   Wear comfortable clothing and clothing appropriate for easy access to any Portacath or PICC line.   We strive to give you quality time with your provider. You may need to reschedule your appointment if you arrive late (15 or more minutes).  Arriving late affects you and other patients whose appointments are after yours.  Also, if you miss three or more appointments without notifying the office, you may be dismissed from the clinic at the provider's discretion.      For prescription refill requests, have your pharmacy contact our office and allow 72 hours for refills to be completed.    Today you received the following chemotherapy and/or immunotherapy agents: trastuzumab-dkst.      To help prevent nausea and vomiting after your treatment, we encourage you to take your nausea medication as directed.  BELOW ARE SYMPTOMS THAT SHOULD BE REPORTED IMMEDIATELY: *FEVER GREATER THAN 100.4 F (38 C) OR HIGHER *CHILLS OR SWEATING *NAUSEA AND VOMITING THAT IS NOT CONTROLLED WITH YOUR NAUSEA MEDICATION *UNUSUAL SHORTNESS OF BREATH *UNUSUAL BRUISING OR BLEEDING *URINARY PROBLEMS (pain or burning when urinating, or frequent urination) *BOWEL PROBLEMS (unusual diarrhea, constipation, pain near the anus) TENDERNESS IN MOUTH AND THROAT WITH OR WITHOUT PRESENCE OF ULCERS (sore throat, sores in mouth, or a toothache) UNUSUAL RASH, SWELLING OR PAIN  UNUSUAL VAGINAL DISCHARGE OR ITCHING   Items with * indicate a potential emergency and should be followed up as soon as possible or go to the Emergency Department if any problems should occur.  Please show the CHEMOTHERAPY ALERT CARD or IMMUNOTHERAPY ALERT CARD at  check-in to the Emergency Department and triage nurse.  Should you have questions after your visit or need to cancel or reschedule your appointment, please contact [facility]  Dept: [phone]  and follow the prompts.  Office hours are 8:00 a.m. to 4:30 p.m. Monday - Friday. Please note that voicemails left after 4:00 p.m. may not be returned until the following business day.  We are closed weekends and major holidays. You have access to a nurse at all times for urgent questions. Please call the main number to the clinic Dept: [phone] and follow the prompts.   For any non-urgent questions, you may also contact your provider using MyChart. We now offer e-Visits for anyone 18 and older to request care online for non-urgent symptoms. For details visit [email or web address].   Also download the MyChart app! Go to the app store, search "MyChart", open the app, select [facility], and log in with your MyChart username and password.  Due to Covid, a mask is required upon entering the hospital/clinic. If you do not have a mask, one will be given to you upon arrival. For doctor visits, patients may have 1 support person aged 18 or older with them. For treatment visits, patients cannot have anyone with them due to current Covid guidelines and our immunocompromised population.    

## 2021-07-18 NOTE — Progress Notes (Signed)
Patient Care Team: Seward Carol, MD as PCP - General (Internal Medicine) Nicholas Lose, MD as Consulting Physician (Hematology and Oncology) [name], MD as Consulting Physician (General Surgery) Kyung Rudd, MD as Consulting Physician (Radiation Oncology) Armandina Stammer, DO as Consulting Physician (Obstetrics and Gynecology)  DIAGNOSIS:    ICD-10-CM   1. Malignant neoplasm of upper-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, estrogen receptor positive ([place])  C50.211    Z17.0       SUMMARY OF ONCOLOGIC HISTORY: Oncology History  Malignant neoplasm of upper-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, estrogen receptor positive ([place])  09/08/2020 Cancer Staging   Staging form: Breast, AJCC 8th Edition - Clinical stage from 09/08/2020: Stage IA (cT1b, cN0, cM0, G2, ER+, PR+, HER2+) - Signed by Gardenia Phlegm, NP on 09/20/2020 Stage prefix: Initial diagnosis    09/08/2020 Initial Diagnosis   Mammogram detected 0.7 cm mass in the right breast 1 o'clock position, right breast biopsy 1: Grade 2 IDC ER 95% PR 95%, Ki-67 30%, HER-2 positive ratio 2.83   10/16/2020 Surgery   Right lumpectomy [name]): IDC, 1.0cm, grade 3, with high grade DCIS, clear margins, 4 right axillary lymph nodes negative for carcinoma.   10/16/2020 Cancer Staging   Staging form: Breast, AJCC 8th Edition - Pathologic stage from 10/16/2020: Stage IA (pT1b, pN0, cM0, G3, ER+, PR+, HER2+) - Signed by Gardenia Phlegm, NP on 11/01/2020 Histologic grading system: 3 grade system    11/13/2020 -  Chemotherapy   Patient is on Treatment Plan : BREAST Paclitaxel + Trastuzumab q7d / Trastuzumab q21d       CHIEF COMPLIANT: Cycle 9 Herceptin  INTERVAL HISTORY: Grace Pennington is a 50 y.o. with above-mentioned history of right breast cancer who underwent a right lumpectomy and is currently on Herceptin. She presents to the clinic today for treatment.  She is tolerating Herceptin extremely well without any  problems or concerns.  ALLERGIES:  has No Known Allergies.  MEDICATIONS:  Current Outpatient Medications  Medication Sig Dispense Refill   ALPRAZolam (XANAX) 0.25 MG tablet Take 1 tablet by mouth once a day if needed 30 tablet 3   carvedilol (COREG) 3.125 MG tablet Take 1 tablet (3.125 mg total) by mouth 2 (two) times daily. 60 tablet 3   citalopram (CELEXA) 10 MG tablet TAKE 1 TABLET BY MOUTH ONCE DAILY 90 tablet 3   fluticasone (FLONASE) 50 MCG/ACT nasal spray Place 2 sprays into both nostrils daily as needed for allergies.     hydrochlorothiazide (HYDRODIURIL) 25 MG tablet TAKE 1 TABLET BY MOUTH ONCE DAILY 90 tablet 3   hydroxypropyl methylcellulose / hypromellose (ISOPTO TEARS / GONIOVISC) 2.5 % ophthalmic solution Place 1 drop into both eyes 3 (three) times daily as needed (dry/irritated eyes).     influenza vac split quadrivalent PF (FLUARIX) 0.5 ML injection Inject 0.5 mLs into the muscle. 0.5 mL 0   lidocaine-prilocaine (EMLA) cream APPLY TO AFFECTED AREA ONCE 30 g 3   loratadine (CLARITIN) 10 MG tablet Take 10 mg by mouth daily as needed for allergies.     losartan (COZAAR) 50 MG tablet Take 1 tablet (50 mg total) by mouth daily. 90 tablet 2   metFORMIN (GLUCOPHAGE) 1000 MG tablet Take 1,000 mg by mouth 2 (two) times daily with a meal.     Multiple Vitamins-Minerals (MULTIVITAMIN WITH MINERALS) tablet Take 1 tablet by mouth daily.     pantoprazole (PROTONIX) 40 MG tablet TAKE 1 TABLET BY MOUTH DAILY 90 tablet  1   rosuvastatin (CRESTOR) 10 MG tablet TAKE 1 TABLET BY MOUTH ONCE A DAY 90 tablet 3   No current facility-administered medications for this visit.    PHYSICAL EXAMINATION: ECOG PERFORMANCE STATUS: 1 - Symptomatic but completely ambulatory  There were no vitals filed for this visit. There were no vitals filed for this visit.  LABORATORY DATA:  I have reviewed the data as listed CMP Latest Ref Rng & Units 06/28/2021 06/08/2021 05/18/2021  Glucose 70 - 99 mg/dL 101(H) 85  120(H)  BUN 6 - 20 mg/dL _0 Creatinine 0.44 - 1.00 mg/dL 1.02(H) 0.94 0.84  Sodium 135 - 145 mmol/L 141 141 139  Potassium 3.5 - 5.1 mmol/L 4.0 3.8 3.5  Chloride 98 - 111 mmol/L 106 106 105  CO2 22 - 32 mmol/L _1 Calcium 8.9 - 10.3 mg/dL 9.0 9.2 9.4  Total Protein 6.5 - 8.1 g/dL 7.5 7.5 7.7  Total Bilirubin 0.3 - 1.2 mg/dL 0.3 0.4 0.3  Alkaline Phos 38 - 126 U/L 67 66 62  AST 15 - 41 U/L _2 ALT 0 - 44 U/L _3 Lab Results  Component Value Date   WBC 5.1 06/28/2021   HGB 13.0 06/28/2021   HCT 40.4 06/28/2021   MCV 85.6 06/28/2021   PLT 230 06/28/2021   NEUTROABS 2.9 06/28/2021    ASSESSMENT & PLAN:  Malignant neoplasm of upper-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, estrogen receptor positive ([facility]) 09/08/2020:Mammogram detected 0.7 cm mass in the right breast 1 o'clock position, right breast biopsy 1: Grade 2 IDC ER 95% PR 95%, Ki-67 30%, HER-2 positive ratio 2.83 T1b N0 stage Ia   10/16/20: Grade 3 IDC with DCIS 0/4 LN  ER 95% PR 95%, Ki-67 30%, HER-2 positive ratio 2.83   Treatment Plan: 1. adjuvant Taxol Herceptin followed by Herceptin maintenance for 1 year plan to start in 3 weeks completed 02/02/2021 2.  Adjuvant radiation completed 04/04/2021 3.  Followed by adjuvant antiestrogen therapy with letrozole started 07/20/2021 (patient went to menopause at the age of 28) -------------------------------------------------------------------------------------------------------------------------- Current treatment: Herceptin maintenance to be completed 12/14/2021 Toxicities: None Echocardiogram 05/17/2021: EF 55% Return to clinic every 3 weeks for Herceptin every 6 weeks for follow-up with me.  Letrozole counseling: We discussed the risks and benefits of anti-estrogen therapy with aromatase inhibitors. These include but not limited to insomnia, hot flashes, mood changes, vaginal dryness, bone density loss, and weight gain. We strongly believe that the  benefits far outweigh the risks. Patient understands these risks and consented to starting treatment. Planned treatment duration is 7 years.  Mammograms will be in January 2023. We will obtain a bone density in 3 months.   No orders of the defined types were placed in this encounter.  The patient has a good understanding of the overall plan. she agrees with it. she will call with any problems that may develop before the next visit here.  Total time spent: 30 mins including face to face time and time spent for planning, charting and coordination of care  Rulon Eisenmenger, MD, MPH 07/20/2021  I, Thana Ates, am acting as scribe for Dr. Nicholas Lose.  I have reviewed the above documentation for accuracy and completeness, and I agree with the above.

## 2021-07-19 NOTE — Assessment & Plan Note (Signed)
09/08/2020:Mammogram detected 0.7 cm mass in the right breast 1 o'clock position, right breast biopsy 1: Grade 2 IDC ER 95% PR 95%, Ki-67 30%, HER-2 positive ratio 2.83 T1b N0 stage Ia  10/16/20: Grade 3 IDC with DCIS 0/4 LNER 95% PR 95%, Ki-67 30%, HER-2 positive ratio 2.83  Treatment Plan: 1.adjuvant Taxol Herceptin followed by Herceptin maintenance for 1 yearplan to start in 3 weeks completed 02/02/2021 2.Adjuvant radiation completed 04/04/2021 3.Followed by adjuvant antiestrogen therapy -------------------------------------------------------------------------------------------------------------------------- Current treatment: Herceptin maintenance to be completed 12/14/2021 Toxicities: None Echocardiogram 05/17/2021: EF 55% Return to clinic every 3 weeks for Herceptin every 6 weeks for follow-up with me.  When she returns we will discuss starting antiestrogen therapy. Mammograms will be in January 2023.

## 2021-07-20 ENCOUNTER — Ambulatory Visit (HOSPITAL_BASED_OUTPATIENT_CLINIC_OR_DEPARTMENT_OTHER)
Admission: RE | Admit: 2021-07-20 | Discharge: 2021-07-20 | Disposition: A | Discharge status: Home / Self Care | Payer: No Typology Code available for payment source | Source: Ambulatory Visit | Attending: Internal Medicine | Admitting: Internal Medicine

## 2021-07-20 ENCOUNTER — Inpatient Hospital Stay (HOSPITAL_BASED_OUTPATIENT_CLINIC_OR_DEPARTMENT_OTHER): Payer: No Typology Code available for payment source | Admitting: Hematology and Oncology

## 2021-07-20 ENCOUNTER — Encounter (HOSPITAL_COMMUNITY): Payer: Self-pay | Admitting: *Deleted

## 2021-07-20 ENCOUNTER — Inpatient Hospital Stay: Payer: No Typology Code available for payment source

## 2021-07-20 ENCOUNTER — Other Ambulatory Visit: Payer: Self-pay

## 2021-07-20 ENCOUNTER — Other Ambulatory Visit (HOSPITAL_COMMUNITY): Payer: Self-pay

## 2021-07-20 ENCOUNTER — Ambulatory Visit (HOSPITAL_COMMUNITY)
Admission: RE | Admit: 2021-07-20 | Discharge: 2021-07-20 | Disposition: A | Discharge status: Home / Self Care | Payer: No Typology Code available for payment source | Source: Ambulatory Visit | Attending: Internal Medicine | Admitting: Internal Medicine

## 2021-07-20 VITALS — BP 119/65 | HR 76 | Wt 261.0 lb

## 2021-07-20 DIAGNOSIS — I10 Essential (primary) hypertension: Secondary | ICD-10-CM

## 2021-07-20 DIAGNOSIS — Z170 Estrogen receptor positive status [ER+]: Secondary | ICD-10-CM

## 2021-07-20 DIAGNOSIS — I110 Hypertensive heart disease with heart failure: Secondary | ICD-10-CM | POA: Insufficient documentation

## 2021-07-20 DIAGNOSIS — G4733 Obstructive sleep apnea (adult) (pediatric): Secondary | ICD-10-CM

## 2021-07-20 DIAGNOSIS — Z95828 Presence of other vascular implants and grafts: Secondary | ICD-10-CM

## 2021-07-20 DIAGNOSIS — I509 Heart failure, unspecified: Secondary | ICD-10-CM | POA: Insufficient documentation

## 2021-07-20 DIAGNOSIS — I427 Cardiomyopathy due to drug and external agent: Secondary | ICD-10-CM | POA: Diagnosis not present

## 2021-07-20 DIAGNOSIS — Z8249 Family history of ischemic heart disease and other diseases of the circulatory system: Secondary | ICD-10-CM

## 2021-07-20 DIAGNOSIS — C50211 Malignant neoplasm of upper-inner quadrant of right female breast: Secondary | ICD-10-CM

## 2021-07-20 DIAGNOSIS — T451X5A Adverse effect of antineoplastic and immunosuppressive drugs, initial encounter: Secondary | ICD-10-CM

## 2021-07-20 LAB — ECHOCARDIOGRAM COMPLETE
Area-P 1/2: 3.37 cm2
S' Lateral: 3.1 cm

## 2021-07-20 LAB — CBC WITH DIFFERENTIAL (CANCER CENTER ONLY)
Abs Immature Granulocytes: 0.01 10*3/uL (ref 0.00–0.07)
Basophils Absolute: 0.1 10*3/uL (ref 0.0–0.1)
Basophils Relative: 1 %
Eosinophils Absolute: 0.4 10*3/uL (ref 0.0–0.5)
Eosinophils Relative: 7 %
HCT: 39.8 % (ref 36.0–46.0)
Hemoglobin: 13 g/dL (ref 12.0–15.0)
Immature Granulocytes: 0 %
Lymphocytes Relative: 27 %
Lymphs Abs: 1.8 10*3/uL (ref 0.7–4.0)
MCH: 28 pg (ref 26.0–34.0)
MCHC: 32.7 g/dL (ref 30.0–36.0)
MCV: 85.6 fL (ref 80.0–100.0)
Monocytes Absolute: 0.6 10*3/uL (ref 0.1–1.0)
Monocytes Relative: 9 %
Neutro Abs: 3.6 10*3/uL (ref 1.7–7.7)
Neutrophils Relative %: 56 %
Platelet Count: 255 10*3/uL (ref 150–400)
RBC: 4.65 MIL/uL (ref 3.87–5.11)
RDW: 14.6 % (ref 11.5–15.5)
WBC Count: 6.4 10*3/uL (ref 4.0–10.5)
nRBC: 0 % (ref 0.0–0.2)

## 2021-07-20 LAB — CMP (CANCER CENTER ONLY)
ALT: 21 U/L (ref 0–44)
AST: 22 U/L (ref 15–41)
Albumin: 4.1 g/dL (ref 3.5–5.0)
Alkaline Phosphatase: 69 U/L (ref 38–126)
Anion gap: 9 (ref 5–15)
BUN: 19 mg/dL (ref 6–20)
CO2: 27 mmol/L (ref 22–32)
Calcium: 9.8 mg/dL (ref 8.9–10.3)
Chloride: 104 mmol/L (ref 98–111)
Creatinine: 1.02 mg/dL — ABNORMAL HIGH (ref 0.44–1.00)
GFR, Estimated: >60 mL/min (ref >60)
Glucose, Bld: 102 mg/dL — ABNORMAL HIGH (ref 70–99)
Potassium: 3.7 mmol/L (ref 3.5–5.1)
Sodium: 140 mmol/L (ref 135–145)
Total Bilirubin: 0.3 mg/dL (ref 0.3–1.2)
Total Protein: 7.6 g/dL (ref 6.5–8.1)

## 2021-07-20 MED ORDER — LOSARTAN POTASSIUM 50 MG PO TABS
50.0000 mg | ORAL_TABLET | Freq: Every day | ORAL | 2 refills | Status: DC
  Filled 2021-07-20: qty 90, 90d supply, fill #0
  Filled 2021-11-18: qty 90, 90d supply, fill #1

## 2021-07-20 MED ORDER — SODIUM CHLORIDE 0.9% FLUSH
10.0000 mL | Freq: Once | INTRAVENOUS | Status: AC
  Administered 2021-07-20: 12:00:00 10 mL

## 2021-07-20 MED ORDER — DIPHENHYDRAMINE HCL 25 MG PO CAPS
50.0000 mg | ORAL_CAPSULE | Freq: Once | ORAL | Status: AC
  Administered 2021-07-20: 15:00:00 50 mg via ORAL
  Filled 2021-07-20: qty 2

## 2021-07-20 MED ORDER — LETROZOLE 2.5 MG PO TABS
2.5000 mg | ORAL_TABLET | Freq: Every day | ORAL | 3 refills | Status: DC
  Filled 2021-07-20: qty 90, 90d supply, fill #0
  Filled 2021-10-22: qty 90, 90d supply, fill #1
  Filled 2022-01-21: qty 90, 90d supply, fill #2
  Filled 2022-04-29: qty 90, 90d supply, fill #3

## 2021-07-20 MED ORDER — HEPARIN SOD (PORK) LOCK FLUSH 100 UNIT/ML IV SOLN
500.0000 [IU] | Freq: Once | INTRAVENOUS | Status: AC | PRN
  Administered 2021-07-20: 16:00:00 500 [IU]

## 2021-07-20 MED ORDER — SODIUM CHLORIDE 0.9 % IV SOLN: Freq: Once | INTRAVENOUS | Status: AC

## 2021-07-20 MED ORDER — ALTEPLASE 2 MG IJ SOLR
2.0000 mg | Freq: Once | INTRAMUSCULAR | Status: AC
  Administered 2021-07-20: 13:00:00 2 mg
  Filled 2021-07-20: qty 2

## 2021-07-20 MED ORDER — TRASTUZUMAB-DKST CHEMO 150 MG IV SOLR
6.0000 mg/kg | Freq: Once | INTRAVENOUS | Status: AC
  Administered 2021-07-20: 15:00:00 693 mg via INTRAVENOUS
  Filled 2021-07-20: qty 33

## 2021-07-20 MED ORDER — SODIUM CHLORIDE 0.9% FLUSH
10.0000 mL | INTRAVENOUS | Status: DC | PRN
  Administered 2021-07-20: 16:00:00 10 mL

## 2021-07-20 MED ORDER — ACETAMINOPHEN 325 MG PO TABS
650.0000 mg | ORAL_TABLET | Freq: Once | ORAL | Status: AC
  Administered 2021-07-20: 15:00:00 650 mg via ORAL
  Filled 2021-07-20: qty 2

## 2021-07-20 NOTE — Progress Notes (Signed)
Per Dr. Lindi Adie, ok for treatment today with ECHO 50-55%.

## 2021-07-20 NOTE — Patient Instructions (Signed)
[facility] MEDICAL ONCOLOGY  Discharge Instructions: Thank you for choosing [facility] to provide your oncology and hematology care.   If you have a lab appointment with the Cancer Center, please go directly to the Cancer Center and check in at the registration area.   Wear comfortable clothing and clothing appropriate for easy access to any Portacath or PICC line.   We strive to give you quality time with your provider. You may need to reschedule your appointment if you arrive late (15 or more minutes).  Arriving late affects you and other patients whose appointments are after yours.  Also, if you miss three or more appointments without notifying the office, you may be dismissed from the clinic at the provider's discretion.      For prescription refill requests, have your pharmacy contact our office and allow 72 hours for refills to be completed.    Today you received the following chemotherapy and/or immunotherapy agent: Trastuzumab ([place]givri)   To help prevent nausea and vomiting after your treatment, we encourage you to take your nausea medication as directed.  BELOW ARE SYMPTOMS THAT SHOULD BE REPORTED IMMEDIATELY: *FEVER GREATER THAN 100.4 F (38 C) OR HIGHER *CHILLS OR SWEATING *NAUSEA AND VOMITING THAT IS NOT CONTROLLED WITH YOUR NAUSEA MEDICATION *UNUSUAL SHORTNESS OF BREATH *UNUSUAL BRUISING OR BLEEDING *URINARY PROBLEMS (pain or burning when urinating, or frequent urination) *BOWEL PROBLEMS (unusual diarrhea, constipation, pain near the anus) TENDERNESS IN MOUTH AND THROAT WITH OR WITHOUT PRESENCE OF ULCERS (sore throat, sores in mouth, or a toothache) UNUSUAL RASH, SWELLING OR PAIN  UNUSUAL VAGINAL DISCHARGE OR ITCHING   Items with * indicate a potential emergency and should be followed up as soon as possible or go to the Emergency Department if any problems should occur.  Please show the CHEMOTHERAPY ALERT CARD or IMMUNOTHERAPY ALERT CARD at  check-in to the Emergency Department and triage nurse.  Should you have questions after your visit or need to cancel or reschedule your appointment, please contact [facility]COL[facility]  Dept: [phone]  and follow the prompts.  Office hours are 8:00 a.m. to 4:30 p.m. Monday - Friday. Please note that voicemails left after 4:00 p.m. may not be returned until the following business day.  We are closed weekends and major holidays. You have access to a nurse at all times for urgent questions. Please call the main number to the clinic Dept: [phone] and follow the prompts.   For any non-urgent questions, you may also contact your provider using MyChart. We now offer e-Visits for anyone 18 and older to request care online for non-urgent symptoms. For details visit [email or web address].   Also download the MyChart app! Go to the app store, search "MyChart", open the app, select [facility], and log in with your MyChart username and password.  Due to Covid, a mask is required upon entering the hospital/clinic. If you do not have a mask, one will be given to you upon arrival. For doctor visits, patients may have 1 support person aged 18 or older with them. For treatment visits, patients cannot have anyone with them due to current Covid guidelines and our immunocompromised population.  

## 2021-07-20 NOTE — Progress Notes (Signed)
CARDIO-ONCOLOGY CLINIC NOTE  Referring Physician:Gudena, Vinay Primary Care:Pol[facility],  Jori Moll Primary Cardiologist:Janaisa Birkland, Quillian Quince  HPI:  Grace Pennington is 50 y.o. female with HTN, OSA, Depression, Anxiety,  Breast Cancer referred by Dr. Nicholas Lose for enrollment into the Cardio-Oncology program.  09/08/2020: Stage IA (cT1b, cN0, cM0, G2, ER+, PR+, HER2+).  10/16/20 right lumpectomy high grade DCIS, clear margins, 4 right axillary lymph nodes negative for carcinoma.  Started on BREAST PACLITAXEL + TRASTUZUMAB Q7D / TRASTUZUMAB Q21D which she finished. She started on herceptin maintenance which will be completed 05/23.  Adjuvant radiation completed 09/22.  Also will be started on antiestrogen therapy.    10/2020 ECHO EF 60-65%, GLS -24.8% 01/2021 ECHO EF 50-55% abnormal septal motion, GLS -18.9% partially brought down by apical 3C view, ? poor visualization of lateral wall.  Maybe subtle decrease in EF/increase in GLS but not clinically significant 04/03/2021 ECHO ECHO EF 45-50%, GLS ~ -13% mild worsening in EF   Herceptin held 9/22 due to dropping EF. Losartan added to carvedilol.   Echo 05/17/21 EF 55% G1DD GLS -16.1% GLS underestimated due to poor endocardial trackin on 3 and 4-chamber images. 2-chamber image looks more accurate at -18.9  Restarted herceptin 10/22.   Sleep study 10/22 with evidence of moderate OSA.  Here today for f/u with echo. Has been doing well. No dyspnea, CP, orthopnea, PND or leg edema. Has been walking or using an exercise bike for 15-20 minutes about 3 days a week. No change in activity tolerance. She is planning to work on weight loss. Inquiring about CPAP supplies.  Echo today 07/20/21 EF 50-55% GLS -16.4%  Past Medical History:  Diagnosis Date   Anxiety    Breast cancer in female [facility] [place])    Right   Depression    Diabetes mellitus without complication ([facility])    GERD (gastroesophageal reflux disease)    Hypertension    Palpitations    Sleep apnea      Current Outpatient Medications  Medication Sig Dispense Refill   ALPRAZolam (XANAX) 0.25 MG tablet Take 1 tablet by mouth once a day if needed 30 tablet 3   carvedilol (COREG) 3.125 MG tablet Take 1 tablet (3.125 mg total) by mouth 2 (two) times daily. 60 tablet 3   citalopram (CELEXA) 10 MG tablet TAKE 1 TABLET BY MOUTH ONCE DAILY 90 tablet 3   fluticasone (FLONASE) 50 MCG/ACT nasal spray Place 2 sprays into both nostrils daily as needed for allergies.     hydrochlorothiazide (HYDRODIURIL) 25 MG tablet TAKE 1 TABLET BY MOUTH ONCE DAILY 90 tablet 3   hydroxypropyl methylcellulose / hypromellose (ISOPTO TEARS / GONIOVISC) 2.5 % ophthalmic solution Place 1 drop into both eyes 3 (three) times daily as needed (dry/irritated eyes).     lidocaine-prilocaine (EMLA) cream APPLY TO AFFECTED AREA ONCE 30 g 3   loratadine (CLARITIN) 10 MG tablet Take 10 mg by mouth daily as needed for allergies.     losartan (COZAAR) 25 MG tablet Take 1 tablet (25 mg total) by mouth daily. 90 tablet 2   metFORMIN (GLUCOPHAGE) 1000 MG tablet Take 1,000 mg by mouth 2 (two) times daily with a meal.     Multiple Vitamins-Minerals (MULTIVITAMIN WITH MINERALS) tablet Take 1 tablet by mouth daily.     pantoprazole (PROTONIX) 40 MG tablet TAKE 1 TABLET BY MOUTH DAILY 90 tablet 1   rosuvastatin (CRESTOR) 10 MG tablet TAKE 1 TABLET BY MOUTH ONCE A DAY 90 tablet 3   influenza vac split quadrivalent PF (FLUARIX)  0.5 ML injection Inject 0.5 mLs into the muscle. 0.5 mL 0   No current facility-administered medications for this encounter.    No Known Allergies    Social History   Socioeconomic History   Marital status: Single    Spouse name: Not on file   Number of children: 2   Years of education: Not on file   Highest education level: Not on file  Occupational History   Occupation: medical records    Employer: [employer]  Tobacco Use   Smoking status: Former    Types: Cigarettes    Quit date: 07/29/1996    Years  since quitting: 24.9   Smokeless tobacco: Never  Vaping Use   Vaping Use: Never used  Substance and Sexual Activity   Alcohol use: Yes    Comment: socially   Drug use: No   Sexual activity: Yes    Birth control/protection: Surgical  Other Topics Concern   Not on file  Social History Narrative   Not on file   Social Determinants of Health   Financial Resource Strain: Not on file  Food Insecurity: Not on file  Transportation Needs: Not on file  Physical Activity: Not on file  Stress: Not on file  Social Connections: Not on file  Intimate Partner Violence: Not At Risk   Fear of Current or Ex-Partner: No   Emotionally Abused: No   Physically Abused: No   Sexually Abused: No      Family History  Problem Relation Age of Onset   Heart disease Mother    Diabetes Mother    Hyperlipidemia Mother    Hypertension Mother    Kidney failure Mother    Diabetes Other    Stroke Other    Hypertension Other    Kidney failure Other    Coronary artery disease Other    Sleep apnea Father    Diabetes Father    Hyperlipidemia Father    Hypertension Father     Vitals:   07/20/21 0938  BP: 119/65  Pulse: 76  SpO2: 96%  Weight: 118.4 kg (261 lb)     PHYSICAL EXAM: General:  Well appearing. No resp difficulty HEENT: normal Neck: supple. no JVD. Carotids 2+ bilat; no bruits. No lymphadenopathy or thryomegaly appreciated. Cor: PMI nondisplaced. Regular rate & rhythm. No rubs, gallops or murmurs. Lungs: clear Abdomen: soft, obese, nontender, nondistended. No hepatosplenomegaly. No bruits or masses. Good bowel sounds. Extremities: no cyanosis, clubbing, rash, edema Neuro: alert & orientedx3, cranial nerves grossly intact. moves all 4 extremities w/o difficulty. Affect pleasant    ASSESSMENT & PLAN: 1. R Breast Cancer c/p chemo-induce cardiomyopathy (Herceptin) - 08/2020 diagnosed with Stage IA (cT1b, cN0, cM0, G2, ER+, PR+, HER2+) R Breast Cancer -10/2020 ECHO EF 60-65%, GLS  -24.8% -01/2021 ECHO EF 50-55% abnormal septal motion, GLS -18.9% partially brought down by apical 3C view, ? poor visualization of lateral wall.  Maybe subtle decrease in EF/increase in GLS but not clinically significant -04/03/2021 ECHO EF 45-50%, GLS ~ -13% mild worsening in EF  -EF improved with holding Herceptin -Echo 05/17/21 EF 55% G1DD GLS -16.1% GLS underestimated due to poor endocardial tracking on 3 and 4-chamber images. 2-chamber image looks more accurate at -18.9 -Back on herceptin after echo 10/22 -Echo today 07/20/21 EF 50-55% GLS -16.4% -Okay to continue herceptin at this time -Continue losartan, spiro and carvedilol   2. HTN - BP at goal. No changes  3. OSA - Moderate on recent sleep study - Inquired about  supplies for CPAP, has not received them from [facility] yet.  - Will reach out to sleep medicine at her request  Follow-up: 2-3 months with echo  FINCH, LINDSAY N, PA-C  10:30 AM  Patient seen and examined with the above-signed Advanced Practice Provider and/or Housestaff. I personally reviewed laboratory data, imaging studies and relevant notes. I independently examined the patient and formulated the important aspects of the plan. I have edited the note to reflect any of my changes or salient points. I have personally discussed the plan with the patient and/or family.  Doing well. Herceptin restarted for several months. Echo today EF 50-55% GLS 16.4% (stable)   General:  Well appearing. No resp difficulty HEENT: normal Neck: supple. no JVD. Carotids 2+ bilat; no bruits. No lymphadenopathy or thryomegaly appreciated. Cor: PMI nondisplaced. Regular rate & rhythm. No rubs, gallops or murmurs. Lungs: clear Abdomen: obese soft, nontender, nondistended. No hepatosplenomegaly. No bruits or masses. Good bowel sounds. Extremities: no cyanosis, clubbing, rash, edema Neuro: alert & orientedx3, cranial nerves grossly intact. moves all 4 extremities w/o difficulty. Affect  pleasant  EF remains on low end of normal but stable. Would continue Herceptin therapy. Repeat echo in 2-3 months. Continue carvedilol. Increase losartan to 50 daily for cardio-protection. Emphasized need to get OSA treated.   Grace Bickers, MD  1:18 PM

## 2021-07-20 NOTE — Progress Notes (Signed)
°  Echocardiogram 2D Echocardiogram has been performed.  Grace Pennington 07/20/2021, 9:42 AM

## 2021-07-20 NOTE — Patient Instructions (Signed)
Medication Changes:  Increase losartan to 50mg    Lab Work:  none  Testing/Procedures:  Your physician has requested that you have an echocardiogram. Echocardiography is a painless test that uses sound waves to create images of your heart. It provides your doctor with information about the size and shape of your heart and how well your hearts chambers and valves are working. This procedure takes approximately one hour. There are no restrictions for this procedure.   Referrals:  none  Special Instructions // Education:  none  Follow-Up in: early March with Echocardiogram  At the [facility], you and your health needs are our priority. We have a designated team specialized in the treatment of Heart Failure. This Care Team includes your primary Heart Failure Specialized Cardiologist (physician), Advanced Practice Providers (APPs- Physician Assistants and Nurse Practitioners), and Pharmacist who all work together to provide you with the care you need, when you need it.   You may see any of the following providers on your designated Care Team at your next follow up:  Dr Glori Bickers Dr Haynes Kerns, NP Lyda Jester, [place] [facility] [place], [place] Audry Riles, PharmD   Please be sure to bring in all your medications bottles to every appointment.   Need to Contact us:  If you have any questions or concerns before your next appointment please send [place] a message through [facility] or call our office at [phone].    TO LEAVE A MESSAGE FOR THE NURSE SELECT OPTION 2, PLEASE LEAVE A MESSAGE INCLUDING: YOUR NAME DATE OF BIRTH CALL BACK NUMBER REASON FOR CALL**this is important as we prioritize the call backs  YOU WILL RECEIVE A CALL BACK THE SAME DAY AS LONG AS YOU CALL BEFORE 4:00 PM

## 2021-07-21 ENCOUNTER — Other Ambulatory Visit (HOSPITAL_COMMUNITY): Payer: Self-pay

## 2021-07-21 MED FILL — Lidocaine-Prilocaine Cream 2.5-2.5%: CUTANEOUS | 30 days supply | Qty: 30 | Fill #0 | Status: AC

## 2021-07-24 ENCOUNTER — Other Ambulatory Visit (HOSPITAL_COMMUNITY): Payer: Self-pay

## 2021-07-24 MED ORDER — ROSUVASTATIN CALCIUM 10 MG PO TABS
ORAL_TABLET | ORAL | 3 refills | Status: DC
  Filled 2021-07-24: qty 90, 90d supply, fill #0
  Filled 2021-10-29: qty 90, 90d supply, fill #1
  Filled 2022-01-21: qty 90, 90d supply, fill #2
  Filled 2022-04-23: qty 90, 90d supply, fill #3

## 2021-07-29 ENCOUNTER — Other Ambulatory Visit (HOSPITAL_COMMUNITY): Payer: Self-pay

## 2021-07-30 ENCOUNTER — Other Ambulatory Visit (HOSPITAL_COMMUNITY): Payer: Self-pay

## 2021-08-10 ENCOUNTER — Inpatient Hospital Stay: Payer: No Typology Code available for payment source

## 2021-08-10 ENCOUNTER — Inpatient Hospital Stay: Payer: No Typology Code available for payment source | Attending: Hematology and Oncology

## 2021-08-10 ENCOUNTER — Other Ambulatory Visit: Payer: Self-pay

## 2021-08-10 VITALS — BP 107/79 | HR 71 | Temp 98.2°F | Resp 16 | Wt 259.5 lb

## 2021-08-10 DIAGNOSIS — Z170 Estrogen receptor positive status [ER+]: Secondary | ICD-10-CM | POA: Diagnosis not present

## 2021-08-10 DIAGNOSIS — C50211 Malignant neoplasm of upper-inner quadrant of right female breast: Secondary | ICD-10-CM | POA: Diagnosis present

## 2021-08-10 DIAGNOSIS — Z5112 Encounter for antineoplastic immunotherapy: Secondary | ICD-10-CM | POA: Diagnosis present

## 2021-08-10 DIAGNOSIS — Z95828 Presence of other vascular implants and grafts: Secondary | ICD-10-CM

## 2021-08-10 LAB — CBC WITH DIFFERENTIAL (CANCER CENTER ONLY)
Abs Immature Granulocytes: 0.01 10*3/uL (ref 0.00–0.07)
Basophils Absolute: 0 10*3/uL (ref 0.0–0.1)
Basophils Relative: 1 %
Eosinophils Absolute: 0.4 10*3/uL (ref 0.0–0.5)
Eosinophils Relative: 6 %
HCT: 39.8 % (ref 36.0–46.0)
Hemoglobin: 13.2 g/dL (ref 12.0–15.0)
Immature Granulocytes: 0 %
Lymphocytes Relative: 26 %
Lymphs Abs: 1.5 10*3/uL (ref 0.7–4.0)
MCH: 28.2 pg (ref 26.0–34.0)
MCHC: 33.2 g/dL (ref 30.0–36.0)
MCV: 85 fL (ref 80.0–100.0)
Monocytes Absolute: 0.6 10*3/uL (ref 0.1–1.0)
Monocytes Relative: 10 %
Neutro Abs: 3.2 10*3/uL (ref 1.7–7.7)
Neutrophils Relative %: 57 %
Platelet Count: 299 10*3/uL (ref 150–400)
RBC: 4.68 MIL/uL (ref 3.87–5.11)
RDW: 14.8 % (ref 11.5–15.5)
WBC Count: 5.6 10*3/uL (ref 4.0–10.5)
nRBC: 0 % (ref 0.0–0.2)

## 2021-08-10 LAB — CMP (CANCER CENTER ONLY)
ALT: 21 U/L (ref 0–44)
AST: 17 U/L (ref 15–41)
Albumin: 4.1 g/dL (ref 3.5–5.0)
Alkaline Phosphatase: 67 U/L (ref 38–126)
Anion gap: 7 (ref 5–15)
BUN: 18 mg/dL (ref 6–20)
CO2: 30 mmol/L (ref 22–32)
Calcium: 9.4 mg/dL (ref 8.9–10.3)
Chloride: 103 mmol/L (ref 98–111)
Creatinine: 0.89 mg/dL (ref 0.44–1.00)
GFR, Estimated: >60 mL/min (ref >60)
Glucose, Bld: 102 mg/dL — ABNORMAL HIGH (ref 70–99)
Potassium: 3.5 mmol/L (ref 3.5–5.1)
Sodium: 140 mmol/L (ref 135–145)
Total Bilirubin: 0.4 mg/dL (ref 0.3–1.2)
Total Protein: 7.7 g/dL (ref 6.5–8.1)

## 2021-08-10 MED ORDER — HEPARIN SOD (PORK) LOCK FLUSH 100 UNIT/ML IV SOLN
500.0000 [IU] | Freq: Once | INTRAVENOUS | Status: AC | PRN
  Administered 2021-08-10: 500 [IU]

## 2021-08-10 MED ORDER — SODIUM CHLORIDE 0.9 % IV SOLN: Freq: Once | INTRAVENOUS | Status: AC

## 2021-08-10 MED ORDER — SODIUM CHLORIDE 0.9% FLUSH
10.0000 mL | INTRAVENOUS | Status: DC | PRN
  Administered 2021-08-10: 10 mL

## 2021-08-10 MED ORDER — DIPHENHYDRAMINE HCL 25 MG PO CAPS
50.0000 mg | ORAL_CAPSULE | Freq: Once | ORAL | Status: AC
  Administered 2021-08-10: 50 mg via ORAL
  Filled 2021-08-10: qty 2

## 2021-08-10 MED ORDER — SODIUM CHLORIDE 0.9% FLUSH
10.0000 mL | Freq: Once | INTRAVENOUS | Status: AC
  Administered 2021-08-10: 10 mL

## 2021-08-10 MED ORDER — TRASTUZUMAB-DKST CHEMO 150 MG IV SOLR
6.0000 mg/kg | Freq: Once | INTRAVENOUS | Status: AC
  Administered 2021-08-10: 693 mg via INTRAVENOUS
  Filled 2021-08-10: qty 33

## 2021-08-10 MED ORDER — ACETAMINOPHEN 325 MG PO TABS
650.0000 mg | ORAL_TABLET | Freq: Once | ORAL | Status: AC
  Administered 2021-08-10: 650 mg via ORAL
  Filled 2021-08-10: qty 2

## 2021-08-10 NOTE — Patient Instructions (Signed)
[facility]  Discharge Instructions: Thank you for choosing [facility] to provide your oncology and hematology care.   If you have a lab appointment with the Cancer Center, please go directly to the Cancer Center and check in at the registration area.   Wear comfortable clothing and clothing appropriate for easy access to any Portacath or PICC line.   We strive to give you quality time with your provider. You may need to reschedule your appointment if you arrive late (15 or more minutes).  Arriving late affects you and other patients whose appointments are after yours.  Also, if you miss three or more appointments without notifying the office, you may be dismissed from the clinic at the provider's discretion.      For prescription refill requests, have your pharmacy contact our office and allow 72 hours for refills to be completed.    Today you received the following chemotherapy and/or immunotherapy agents: Ogivri      To help prevent nausea and vomiting after your treatment, we encourage you to take your nausea medication as directed.  BELOW ARE SYMPTOMS THAT SHOULD BE REPORTED IMMEDIATELY: *FEVER GREATER THAN 100.4 F (38 C) OR HIGHER *CHILLS OR SWEATING *NAUSEA AND VOMITING THAT IS NOT CONTROLLED WITH YOUR NAUSEA MEDICATION *UNUSUAL SHORTNESS OF BREATH *UNUSUAL BRUISING OR BLEEDING *URINARY PROBLEMS (pain or burning when urinating, or frequent urination) *BOWEL PROBLEMS (unusual diarrhea, constipation, pain near the anus) TENDERNESS IN MOUTH AND THROAT WITH OR WITHOUT PRESENCE OF ULCERS (sore throat, sores in mouth, or a toothache) UNUSUAL RASH, SWELLING OR PAIN  UNUSUAL VAGINAL DISCHARGE OR ITCHING   Items with * indicate a potential emergency and should be followed up as soon as possible or go to the Emergency Department if any problems should occur.  Please show the CHEMOTHERAPY ALERT CARD or IMMUNOTHERAPY ALERT CARD at check-in to the  Emergency Department and triage nurse.  Should you have questions after your visit or need to cancel or reschedule your appointment, please contact [facility]  Dept: [phone]  and follow the prompts.  Office hours are 8:00 a.m. to 4:30 p.m. Monday - Friday. Please note that voicemails left after 4:00 p.m. may not be returned until the following business day.  We are closed weekends and major holidays. You have access to a nurse at all times for urgent questions. Please call the main number to the clinic Dept: [phone] and follow the prompts.   For any non-urgent questions, you may also contact your provider using MyChart. We now offer e-Visits for anyone 18 and older to request care online for non-urgent symptoms. For details visit [email or web address].   Also download the MyChart app! Go to the app store, search "MyChart", open the app, select , and log in with your MyChart username and password.  Due to Covid, a mask is required upon entering the hospital/clinic. If you do not have a mask, one will be given to you upon arrival. For doctor visits, patients may have 1 support person aged 18 or older with them. For treatment visits, patients cannot have anyone with them due to current Covid guidelines and our immunocompromised population.   

## 2021-08-24 ENCOUNTER — Ambulatory Visit
Admission: RE | Admit: 2021-08-24 | Discharge: 2021-08-24 | Disposition: A | Discharge status: Home / Self Care | Payer: No Typology Code available for payment source | Source: Ambulatory Visit | Attending: Hematology and Oncology | Admitting: Hematology and Oncology

## 2021-08-24 ENCOUNTER — Encounter: Payer: Self-pay | Admitting: Hematology and Oncology

## 2021-08-24 DIAGNOSIS — C50211 Malignant neoplasm of upper-inner quadrant of right female breast: Secondary | ICD-10-CM

## 2021-08-30 ENCOUNTER — Other Ambulatory Visit: Payer: Self-pay

## 2021-08-30 ENCOUNTER — Ambulatory Visit
Admission: RE | Admit: 2021-08-30 | Discharge: 2021-08-30 | Disposition: A | Discharge status: Home / Self Care | Payer: No Typology Code available for payment source | Source: Ambulatory Visit | Attending: Hematology and Oncology | Admitting: Hematology and Oncology

## 2021-08-30 DIAGNOSIS — C50211 Malignant neoplasm of upper-inner quadrant of right female breast: Secondary | ICD-10-CM

## 2021-08-30 NOTE — Assessment & Plan Note (Signed)
09/08/2020:Mammogram detected 0.7 cm mass in the right breast 1 o'clock position, right breast biopsy 1: Grade 2 IDC ER 95% PR 95%, Ki-67 30%, HER-2 positive ratio 2.83 T1b N0 stage Ia  10/16/20: Grade 3 IDC with DCIS 0/4 LNER 95% PR 95%, Ki-67 30%, HER-2 positive ratio 2.83  Treatment Plan: 1.adjuvant Taxol Herceptin followed by Herceptin maintenance for 1 yearplan to start in 3 weekscompleted 02/02/2021 2.Adjuvant radiationcompleted 04/04/2021 3.Followed by adjuvant antiestrogen therapy with letrozole started 07/20/2021 (patient went to menopause at the age of 7) -------------------------------------------------------------------------------------------------------------------------- Current treatment: Herceptin maintenanceto be completed 12/14/2021 Toxicities: None Echocardiogram 05/17/2021: EF 55% Return to clinic every 3 weeks for Herceptin every 6 weeks for follow-up with me.  Letrozole toxicities:  Planned treatment duration is 7 years.  Mammograms will be in January 2023. We will obtain a bone density in 3 months.

## 2021-08-30 NOTE — Progress Notes (Signed)
° °Patient Care Team: °Polite, Ronald, MD as PCP - General (Internal Medicine) °Gudena, Vinay, MD as Consulting Physician (Hematology and Oncology) °Toth, Paul III, MD as Consulting Physician (General Surgery) °Moody, John, MD as Consulting Physician (Radiation Oncology) °Law, Cassandra A, DO as Consulting Physician (Obstetrics and Gynecology) ° °DIAGNOSIS:  °  ICD-10-CM   °1. Malignant neoplasm of upper-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, estrogen receptor positive (HCC)  C50.211   ° Z17.0   °  ° ° °SUMMARY OF ONCOLOGIC HISTORY: °Oncology History  °Malignant neoplasm of upper-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, estrogen receptor positive (HCC)  °09/08/2020 Cancer Staging  ° Staging form: Breast, AJCC 8th Edition °- Clinical stage from 09/08/2020: Stage IA (cT1b, cN0, cM0, G2, ER+, PR+, HER2+) - Signed by Causey, Lindsey Cornetto, NP on 09/20/2020 °Stage prefix: Initial diagnosis ° °  °09/08/2020 Initial Diagnosis  ° Mammogram detected 0.7 cm mass in the right breast 1 o'clock position, right breast biopsy 1: Grade 2 IDC ER 95% PR 95%, Ki-67 30%, HER-2 positive ratio 2.83 °  °10/16/2020 Surgery  ° Right lumpectomy (Toth): IDC, 1.0cm, grade 3, with high grade DCIS, clear margins, 4 right axillary lymph nodes negative for carcinoma. °  °10/16/2020 Cancer Staging  ° Staging form: Breast, AJCC 8th Edition °- Pathologic stage from 10/16/2020: Stage IA (pT1b, pN0, cM0, G3, ER+, PR+, HER2+) - Signed by Causey, Lindsey Cornetto, NP on 11/01/2020 °Histologic grading system: 3 grade system ° °  °11/13/2020 -  Chemotherapy  ° Patient is on Treatment Plan : BREAST Paclitaxel + Trastuzumab q7d / Trastuzumab q21d  °   ° ° °CHIEF COMPLIANT: Cycle 11 Herceptin ° °INTERVAL HISTORY: Grace Pennington is a 50 y.o. with above-mentioned history of right breast cancer who underwent a right lumpectomy and is currently on Herceptin. She presents to the clinic today for treatment.  She is tolerating Herceptin and letrozole extremely well  without any problems.  Denies any hot flashes or arthralgias or myalgias. ° °ALLERGIES:  has No Known Allergies. ° °MEDICATIONS:  °Current Outpatient Medications  °Medication Sig Dispense Refill  ° ALPRAZolam (XANAX) 0.25 MG tablet Take 1 tablet by mouth once a day if needed 30 tablet 3  ° carvedilol (COREG) 3.125 MG tablet Take 1 tablet (3.125 mg total) by mouth 2 (two) times daily. 60 tablet 3  ° citalopram (CELEXA) 10 MG tablet TAKE 1 TABLET BY MOUTH ONCE DAILY 90 tablet 3  ° fluticasone (FLONASE) 50 MCG/ACT nasal spray Place 2 sprays into both nostrils daily as needed for allergies.    ° hydrochlorothiazide (HYDRODIURIL) 25 MG tablet TAKE 1 TABLET BY MOUTH ONCE DAILY 90 tablet 3  ° hydroxypropyl methylcellulose / hypromellose (ISOPTO TEARS / GONIOVISC) 2.5 % ophthalmic solution Place 1 drop into both eyes 3 (three) times daily as needed (dry/irritated eyes).    ° influenza vac split quadrivalent PF (FLUARIX) 0.5 ML injection Inject 0.5 mLs into the muscle. 0.5 mL 0  ° letrozole (FEMARA) 2.5 MG tablet Take 1 tablet by mouth daily. 90 tablet 3  ° lidocaine-prilocaine (EMLA) cream APPLY TO AFFECTED AREA ONCE 30 g 3  ° loratadine (CLARITIN) 10 MG tablet Take 10 mg by mouth daily as needed for allergies.    ° losartan (COZAAR) 50 MG tablet Take 1 tablet (50 mg total) by mouth daily. 90 tablet 2  ° metFORMIN (GLUCOPHAGE) 1000 MG tablet Take 1,000 mg by mouth 2 (two) times daily with a meal.    ° Multiple Vitamins-Minerals (MULTIVITAMIN WITH   MINERALS) tablet Take 1 tablet by mouth daily.    ° pantoprazole (PROTONIX) 40 MG tablet TAKE 1 TABLET BY MOUTH DAILY 90 tablet 1  ° rosuvastatin (CRESTOR) 10 MG tablet TAKE 1 TABLET BY MOUTH ONCE A DAY 90 tablet 3  ° rosuvastatin (CRESTOR) 10 MG tablet Take 1 tablet by mouth once a day 90 tablet 3  ° °No current facility-administered medications for this visit.  ° ° °PHYSICAL EXAMINATION: °ECOG PERFORMANCE STATUS: 1 - Symptomatic but completely ambulatory ° °Vitals:  ° 08/31/21  0857  °BP: 121/83  °Pulse: 65  °Resp: 16  °Temp: 97.7 °F (36.5 °C)  °SpO2: 100%  ° °Filed Weights  ° 08/31/21 0857  °Weight: 258 lb 14.4 oz (117.4 kg)  ° ° °LABORATORY DATA:  °I have reviewed the data as listed °CMP Latest Ref Rng & Units 08/31/2021 08/10/2021 07/20/2021  °Glucose 70 - 99 mg/dL 97 102(H) 102(H)  °BUN 6 - 20 mg/dL 19 18 19  °Creatinine 0.44 - 1.00 mg/dL 1.01(H) 0.89 1.02(H)  °Sodium 135 - 145 mmol/L 143 140 140  °Potassium 3.5 - 5.1 mmol/L 3.9 3.5 3.7  °Chloride 98 - 111 mmol/L 107 103 104  °CO2 22 - 32 mmol/L 29 30 27  °Calcium 8.9 - 10.3 mg/dL 9.6 9.4 9.8  °Total Protein 6.5 - 8.1 g/dL 7.6 7.7 7.6  °Total Bilirubin 0.3 - 1.2 mg/dL 0.3 0.4 0.3  °Alkaline Phos 38 - 126 U/L 60 67 69  °AST 15 - 41 U/L 24 17 22  °ALT 0 - 44 U/L 25 21 21  ° ° °Lab Results  °Component Value Date  ° WBC 5.8 08/31/2021  ° HGB 12.5 08/31/2021  ° HCT 37.5 08/31/2021  ° MCV 84.7 08/31/2021  ° PLT 259 08/31/2021  ° NEUTROABS 3.0 08/31/2021  ° ° °ASSESSMENT & PLAN:  °Malignant neoplasm of upper-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, estrogen receptor positive (HCC) °09/08/2020:Mammogram detected 0.7 cm mass in the right breast 1 o'clock position, right breast biopsy 1: Grade 2 IDC ER 95% PR 95%, Ki-67 30%, HER-2 positive ratio 2.83 °T1b N0 stage Ia °  °10/16/20: Grade 3 IDC with DCIS 0/4 LN  ER 95% PR 95%, Ki-67 30%, HER-2 positive ratio 2.83 °  °Treatment Plan: °1. adjuvant Taxol Herceptin followed by Herceptin maintenance for 1 year plan to start in 3 weeks completed 02/02/2021 °2.  Adjuvant radiation completed 04/04/2021 °3.  Followed by adjuvant antiestrogen therapy with letrozole started 07/20/2021 (patient went to menopause at the age of 44) °-------------------------------------------------------------------------------------------------------------------------- °Current treatment: Herceptin maintenance to be completed 12/14/2021 °Toxicities: None °Echocardiogram 05/17/2021: EF 55% °Return to clinic every 3 weeks for Herceptin  every 6 weeks for follow-up with me. ° °Letrozole toxicities:  °Denies any adverse effects °Denies any hot flashes or arthralgias or myalgias. °Planned treatment duration is 7 years. °  °Bone density: 08/30/2021: Osteoporosis T score -2.5: We will add Reclast infusion to her next Herceptin treatment.  She will receive this once a year.  She takes calcium and vitamin D. ° °Mammograms: January 2023: Benign °Return to clinic every 3 weeks for Herceptin every 6 weeks with follow-up with me. °  ° ° ° °No orders of the defined types were placed in this encounter. ° °The patient has a good understanding of the overall plan. she agrees with it. she will call with any problems that may develop before the next visit here. ° °Total time spent: 30 mins including face to face time and time spent for planning, charting   and coordination of care ° °Vinay K Gudena, MD, MPH °08/31/2021 ° °I, Kirstyn Evans, am acting as scribe for Dr. Vinay Gudena. ° °I have reviewed the above documentation for accuracy and completeness, and I agree with the above. ° ° ° ° ° ° °

## 2021-08-31 ENCOUNTER — Inpatient Hospital Stay: Payer: No Typology Code available for payment source

## 2021-08-31 ENCOUNTER — Inpatient Hospital Stay (HOSPITAL_BASED_OUTPATIENT_CLINIC_OR_DEPARTMENT_OTHER): Payer: No Typology Code available for payment source | Admitting: Hematology and Oncology

## 2021-08-31 ENCOUNTER — Inpatient Hospital Stay: Payer: No Typology Code available for payment source | Attending: Hematology and Oncology

## 2021-08-31 DIAGNOSIS — Z170 Estrogen receptor positive status [ER+]: Secondary | ICD-10-CM | POA: Diagnosis not present

## 2021-08-31 DIAGNOSIS — Z95828 Presence of other vascular implants and grafts: Secondary | ICD-10-CM

## 2021-08-31 DIAGNOSIS — Z5112 Encounter for antineoplastic immunotherapy: Secondary | ICD-10-CM | POA: Diagnosis not present

## 2021-08-31 DIAGNOSIS — C50211 Malignant neoplasm of upper-inner quadrant of right female breast: Secondary | ICD-10-CM

## 2021-08-31 LAB — CMP (CANCER CENTER ONLY)
ALT: 25 U/L (ref 0–44)
AST: 24 U/L (ref 15–41)
Albumin: 4.2 g/dL (ref 3.5–5.0)
Alkaline Phosphatase: 60 U/L (ref 38–126)
Anion gap: 7 (ref 5–15)
BUN: 19 mg/dL (ref 6–20)
CO2: 29 mmol/L (ref 22–32)
Calcium: 9.6 mg/dL (ref 8.9–10.3)
Chloride: 107 mmol/L (ref 98–111)
Creatinine: 1.01 mg/dL — ABNORMAL HIGH (ref 0.44–1.00)
GFR, Estimated: >60 mL/min (ref >60)
Glucose, Bld: 97 mg/dL (ref 70–99)
Potassium: 3.9 mmol/L (ref 3.5–5.1)
Sodium: 143 mmol/L (ref 135–145)
Total Bilirubin: 0.3 mg/dL (ref 0.3–1.2)
Total Protein: 7.6 g/dL (ref 6.5–8.1)

## 2021-08-31 LAB — CBC WITH DIFFERENTIAL (CANCER CENTER ONLY)
Abs Immature Granulocytes: 0.02 10*3/uL (ref 0.00–0.07)
Basophils Absolute: 0.1 10*3/uL (ref 0.0–0.1)
Basophils Relative: 1 %
Eosinophils Absolute: 0.3 10*3/uL (ref 0.0–0.5)
Eosinophils Relative: 6 %
HCT: 37.5 % (ref 36.0–46.0)
Hemoglobin: 12.5 g/dL (ref 12.0–15.0)
Immature Granulocytes: 0 %
Lymphocytes Relative: 31 %
Lymphs Abs: 1.8 10*3/uL (ref 0.7–4.0)
MCH: 28.2 pg (ref 26.0–34.0)
MCHC: 33.3 g/dL (ref 30.0–36.0)
MCV: 84.7 fL (ref 80.0–100.0)
Monocytes Absolute: 0.6 10*3/uL (ref 0.1–1.0)
Monocytes Relative: 10 %
Neutro Abs: 3 10*3/uL (ref 1.7–7.7)
Neutrophils Relative %: 52 %
Platelet Count: 259 10*3/uL (ref 150–400)
RBC: 4.43 MIL/uL (ref 3.87–5.11)
RDW: 15.2 % (ref 11.5–15.5)
WBC Count: 5.8 10*3/uL (ref 4.0–10.5)
nRBC: 0 % (ref 0.0–0.2)

## 2021-08-31 MED ORDER — ACETAMINOPHEN 325 MG PO TABS
650.0000 mg | ORAL_TABLET | Freq: Once | ORAL | Status: AC
  Administered 2021-08-31: 650 mg via ORAL
  Filled 2021-08-31: qty 2

## 2021-08-31 MED ORDER — SODIUM CHLORIDE 0.9 % IV SOLN: Freq: Once | INTRAVENOUS | Status: AC

## 2021-08-31 MED ORDER — SODIUM CHLORIDE 0.9% FLUSH
10.0000 mL | Freq: Once | INTRAVENOUS | Status: AC
  Administered 2021-08-31: 10 mL

## 2021-08-31 MED ORDER — DIPHENHYDRAMINE HCL 25 MG PO CAPS
50.0000 mg | ORAL_CAPSULE | Freq: Once | ORAL | Status: AC
  Administered 2021-08-31: 50 mg via ORAL
  Filled 2021-08-31: qty 2

## 2021-08-31 MED ORDER — TRASTUZUMAB-DKST CHEMO 150 MG IV SOLR
6.0000 mg/kg | Freq: Once | INTRAVENOUS | Status: AC
  Administered 2021-08-31: 693 mg via INTRAVENOUS
  Filled 2021-08-31: qty 33

## 2021-08-31 NOTE — Patient Instructions (Signed)
[facility]  Discharge Instructions: Thank you for choosing [facility] to provide your oncology and hematology care.   If you have a lab appointment with the Cancer Center, please go directly to the Cancer Center and check in at the registration area.   Wear comfortable clothing and clothing appropriate for easy access to any Portacath or PICC line.   We strive to give you quality time with your provider. You may need to reschedule your appointment if you arrive late (15 or more minutes).  Arriving late affects you and other patients whose appointments are after yours.  Also, if you miss three or more appointments without notifying the office, you may be dismissed from the clinic at the provider's discretion.      For prescription refill requests, have your pharmacy contact our office and allow 72 hours for refills to be completed.    Today you received the following chemotherapy and/or immunotherapy agents: Ogivri      To help prevent nausea and vomiting after your treatment, we encourage you to take your nausea medication as directed.  BELOW ARE SYMPTOMS THAT SHOULD BE REPORTED IMMEDIATELY: *FEVER GREATER THAN 100.4 F (38 C) OR HIGHER *CHILLS OR SWEATING *NAUSEA AND VOMITING THAT IS NOT CONTROLLED WITH YOUR NAUSEA MEDICATION *UNUSUAL SHORTNESS OF BREATH *UNUSUAL BRUISING OR BLEEDING *URINARY PROBLEMS (pain or burning when urinating, or frequent urination) *BOWEL PROBLEMS (unusual diarrhea, constipation, pain near the anus) TENDERNESS IN MOUTH AND THROAT WITH OR WITHOUT PRESENCE OF ULCERS (sore throat, sores in mouth, or a toothache) UNUSUAL RASH, SWELLING OR PAIN  UNUSUAL VAGINAL DISCHARGE OR ITCHING   Items with * indicate a potential emergency and should be followed up as soon as possible or go to the Emergency Department if any problems should occur.  Please show the CHEMOTHERAPY ALERT CARD or IMMUNOTHERAPY ALERT CARD at check-in to the  Emergency Department and triage nurse.  Should you have questions after your visit or need to cancel or reschedule your appointment, please contact [facility]  Dept: [phone]  and follow the prompts.  Office hours are 8:00 a.m. to 4:30 p.m. Monday - Friday. Please note that voicemails left after 4:00 p.m. may not be returned until the following business day.  We are closed weekends and major holidays. You have access to a nurse at all times for urgent questions. Please call the main number to the clinic Dept: [phone] and follow the prompts.   For any non-urgent questions, you may also contact your provider using MyChart. We now offer e-Visits for anyone 18 and older to request care online for non-urgent symptoms. For details visit [email or web address].   Also download the MyChart app! Go to the app store, search "MyChart", open the app, select [name], and log in with your MyChart username and password.  Due to Covid, a mask is required upon entering the hospital/clinic. If you do not have a mask, one will be given to you upon arrival. For doctor visits, patients may have 1 support person aged 18 or older with them. For treatment visits, patients cannot have anyone with them due to current Covid guidelines and our immunocompromised population.   

## 2021-09-03 ENCOUNTER — Telehealth: Payer: Self-pay | Admitting: Hematology and Oncology

## 2021-09-03 NOTE — Telephone Encounter (Signed)
Cancelled labs per 2/3 los. Patient is aware of the changes made to upcoming appointments. Patient will be mailed an updated calendar.

## 2021-09-07 ENCOUNTER — Other Ambulatory Visit (HOSPITAL_COMMUNITY): Payer: Self-pay

## 2021-09-07 MED ORDER — METFORMIN HCL 1000 MG PO TABS
ORAL_TABLET | ORAL | 3 refills | Status: DC
  Filled 2021-09-07: qty 180, 90d supply, fill #0

## 2021-09-10 ENCOUNTER — Ambulatory Visit: Payer: No Typology Code available for payment source | Attending: General Surgery

## 2021-09-10 ENCOUNTER — Other Ambulatory Visit: Payer: Self-pay

## 2021-09-10 VITALS — Wt 256.1 lb

## 2021-09-10 DIAGNOSIS — Z483 Aftercare following surgery for neoplasm: Secondary | ICD-10-CM | POA: Insufficient documentation

## 2021-09-10 NOTE — Therapy (Signed)
[facility] [address] [place] [place], [place], [address] Phone: [phone]   Fax:  [phone]  Physical Therapy Treatment  Patient Details  Name: Grace Pennington MRN: [id number] Date of Birth: 07/20/1971 Referring Provider (PT): Lindi Adie   Encounter Date: 09/10/2021   PT End of Session - 09/10/21 0800     Visit Number 7   # unchanged due to screen only   PT Start Time 0758    PT Stop Time 0802    PT Time Calculation (min) 4 min    Activity Tolerance Patient tolerated treatment well    Behavior During Therapy WFL for tasks assessed/performed             Past Medical History:  Diagnosis Date   Anxiety    Breast cancer in female [facility])    Right   Depression    Diabetes mellitus without complication ([facility])    GERD (gastroesophageal reflux disease)    Hypertension    Palpitations    Sleep apnea     Past Surgical History:  Procedure Laterality Date   BREAST LUMPECTOMY WITH RADIOACTIVE SEED AND SENTINEL LYMPH NODE BIOPSY Right 10/16/2020   Procedure: RIGHT BREAST LUMPECTOMY WITH RADIOACTIVE SEED AND SENTINEL LYMPH NODE BIOPSY;  Surgeon: [name], MD;  Location: [facility];  Service: General;  Laterality: Right;   PORTACATH PLACEMENT Left 10/16/2020   Procedure: INSERTION PORT-A-CATH;  Surgeon: [name], MD;  Location: [facility];  Service: General;  Laterality: Left;   TUBAL LIGATION      Vitals:   09/10/21 0758  Weight: 256 lb 2 oz (116.2 kg)     Subjective Assessment - 09/10/21 0759     Subjective Pt returns for her 3 month L-Dex screen.    Pertinent History Rtlumpectomy and SLNB on 10/16/20 with Dr. [name] 0/4 nodes positive due to ER+ breast cancer with chemotherapy and radiation, completed radiation on 9/7,  HTN                    L-DEX FLOWSHEETS - 09/10/21 0800       L-DEX LYMPHEDEMA SCREENING   Measurement Type Unilateral    L-DEX MEASUREMENT EXTREMITY Upper Extremity    POSITION  Standing     DOMINANT SIDE Right    At Risk Side Right    BASELINE SCORE (UNILATERAL) 1    L-DEX SCORE (UNILATERAL) 6.4    VALUE CHANGE (UNILAT) 5.4                                     PT Long Term Goals - 05/31/21 1631       PT LONG TERM GOAL #1   Title Pt will be independent in self MLD for long term management of lymphedema.    Baseline 05/22/21- still working on this; pt feels confident in this after review today - 05/31/21    Status Achieved      PT LONG TERM GOAL #2   Title Pt will obtain appropriate compression garments for long term management of lymphedema.    Baseline 05/22/21- pt received her sleeve but forgot to bring it in today- will assess fit at next session; pt has received this and been wearing daily and is a good fit - 05/31/21    Status Achieved      PT LONG TERM GOAL #3   Title Pt will demonstrate  a 1.5 cm decrease in edema 15 cm proximal to ulnar styloid process to decrease risk of cellulitis.    Baseline 33.2 cm; did not remeasure circumference today but pts SOZO change from baseline did reduced below 6.5 to a new score of 6.1 - 05/31/21    Status Partially Met                   Plan - 09/10/21 0803     Clinical Impression Statement Pt returns for her 3 month L-Dex screen. Her change from baseline of 5.4 is WNLs so no further treatment is required at this time, except did encourage pt to cont wearing her compression sleeve during periods of prolonged increased or repetitive activity due to her baseline is still averaging hight than most and she did have subclinical lymphedema. So in wearing her compression sleeve she will better support her still healing lymphatic system. Pt able to verbalie good understanding and is agreeable to continuing every 3 month L-Dex screens.    PT Next Visit Plan D/C this visit and cont every 3 month L-Dex screens for up to 2 years from her SLNB (10/17/22).    Consulted and Agree with Plan of Care Patient              Patient will benefit from skilled therapeutic intervention in order to improve the following deficits and impairments:     Visit Diagnosis: Aftercare following surgery for neoplasm     Problem List Patient Active Problem List   Diagnosis Date Noted   Port-A-Cath in place 11/20/2020   Malignant neoplasm of upper-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, estrogen receptor positive ([facility]) 09/20/2020   Precordial pain 04/14/2014   Essential hypertension, benign 08/03/2013   Family history of ischemic heart disease 08/03/2013    [name], PTA 09/10/2021, 8:05 AM  [facility] @ [address] [place], [place], [address] Phone: [phone]   Fax:  [phone]  Name: Grace Pennington MRN: [id number] Date of Birth: [birth year]

## 2021-09-13 ENCOUNTER — Other Ambulatory Visit (HOSPITAL_COMMUNITY): Payer: Self-pay

## 2021-09-14 ENCOUNTER — Other Ambulatory Visit (HOSPITAL_COMMUNITY): Payer: Self-pay

## 2021-09-14 MED ORDER — HYDROCHLOROTHIAZIDE 25 MG PO TABS
25.0000 mg | ORAL_TABLET | Freq: Every day | ORAL | 3 refills | Status: DC
  Filled 2021-09-14: qty 90, 90d supply, fill #0
  Filled 2021-12-07: qty 90, 90d supply, fill #1
  Filled 2022-03-21: qty 90, 90d supply, fill #2
  Filled 2022-06-11: qty 90, 90d supply, fill #3

## 2021-09-14 MED ORDER — CITALOPRAM HYDROBROMIDE 10 MG PO TABS
10.0000 mg | ORAL_TABLET | Freq: Every day | ORAL | 3 refills | Status: DC
  Filled 2021-09-14: qty 90, 90d supply, fill #0
  Filled 2021-12-21: qty 90, 90d supply, fill #1
  Filled 2022-04-05: qty 81, 81d supply, fill #2
  Filled 2022-04-05: qty 9, 9d supply, fill #2
  Filled 2022-07-03: qty 90, 90d supply, fill #3

## 2021-09-20 ENCOUNTER — Encounter: Payer: Self-pay | Admitting: *Deleted

## 2021-09-21 ENCOUNTER — Other Ambulatory Visit: Payer: Self-pay

## 2021-09-21 ENCOUNTER — Inpatient Hospital Stay: Payer: No Typology Code available for payment source

## 2021-09-21 ENCOUNTER — Other Ambulatory Visit: Payer: No Typology Code available for payment source

## 2021-09-21 VITALS — BP 105/90 | HR 87 | Temp 98.2°F | Resp 18 | Wt 258.4 lb

## 2021-09-21 DIAGNOSIS — Z5112 Encounter for antineoplastic immunotherapy: Secondary | ICD-10-CM | POA: Diagnosis not present

## 2021-09-21 DIAGNOSIS — C50211 Malignant neoplasm of upper-inner quadrant of right female breast: Secondary | ICD-10-CM

## 2021-09-21 DIAGNOSIS — Z95828 Presence of other vascular implants and grafts: Secondary | ICD-10-CM

## 2021-09-21 MED ORDER — ZOLEDRONIC ACID 4 MG/100ML IV SOLN: 4.0000 mg | Freq: Once | INTRAVENOUS | Status: DC

## 2021-09-21 MED ORDER — TRASTUZUMAB-DKST CHEMO 150 MG IV SOLR
6.0000 mg/kg | Freq: Once | INTRAVENOUS | Status: AC
  Administered 2021-09-21: 693 mg via INTRAVENOUS
  Filled 2021-09-21: qty 33

## 2021-09-21 MED ORDER — ZOLEDRONIC ACID 5 MG/100ML IV SOLN: 5.0000 mg | Freq: Once | INTRAVENOUS | Status: DC

## 2021-09-21 MED ORDER — SODIUM CHLORIDE 0.9 % IV SOLN: Freq: Once | INTRAVENOUS | Status: DC

## 2021-09-21 MED ORDER — SODIUM CHLORIDE 0.9% FLUSH
10.0000 mL | INTRAVENOUS | Status: DC | PRN
  Administered 2021-09-21: 10 mL

## 2021-09-21 MED ORDER — HEPARIN SOD (PORK) LOCK FLUSH 100 UNIT/ML IV SOLN
500.0000 [IU] | Freq: Once | INTRAVENOUS | Status: AC | PRN
  Administered 2021-09-21: 500 [IU]

## 2021-09-21 MED ORDER — DIPHENHYDRAMINE HCL 25 MG PO CAPS
50.0000 mg | ORAL_CAPSULE | Freq: Once | ORAL | Status: AC
  Administered 2021-09-21: 50 mg via ORAL
  Filled 2021-09-21: qty 2

## 2021-09-21 MED ORDER — SODIUM CHLORIDE 0.9 % IV SOLN: Freq: Once | INTRAVENOUS | Status: AC

## 2021-09-21 MED ORDER — ACETAMINOPHEN 325 MG PO TABS
650.0000 mg | ORAL_TABLET | Freq: Once | ORAL | Status: AC
  Administered 2021-09-21: 650 mg via ORAL
  Filled 2021-09-21: qty 2

## 2021-09-21 NOTE — Patient Instructions (Signed)
[facility]  Discharge Instructions: Thank you for choosing [facility] to provide your oncology and hematology care.   If you have a lab appointment with the [facility], please go directly to the [facility] and check in at the registration area.   Wear comfortable clothing and clothing appropriate for easy access to any Portacath or PICC line.   We strive to give you quality time with your provider. You may need to reschedule your appointment if you arrive late (15 or more minutes).  Arriving late affects you and other patients whose appointments are after yours.  Also, if you miss three or more appointments without notifying the office, you may be dismissed from the clinic at the providers discretion.      For prescription refill requests, have your pharmacy contact our office and allow 72 hours for refills to be completed.    Today you received the following chemotherapy and/or immunotherapy agents: Traztuzumab.       To help prevent nausea and vomiting after your treatment, we encourage you to take your nausea medication as directed.  BELOW ARE SYMPTOMS THAT SHOULD BE REPORTED IMMEDIATELY: *FEVER GREATER THAN 100.4 F (38 C) OR HIGHER *CHILLS OR SWEATING *NAUSEA AND VOMITING THAT IS NOT CONTROLLED WITH YOUR NAUSEA MEDICATION *UNUSUAL SHORTNESS OF BREATH *UNUSUAL BRUISING OR BLEEDING *URINARY PROBLEMS (pain or burning when urinating, or frequent urination) *BOWEL PROBLEMS (unusual diarrhea, constipation, pain near the anus) TENDERNESS IN MOUTH AND THROAT WITH OR WITHOUT PRESENCE OF ULCERS (sore throat, sores in mouth, or a toothache) UNUSUAL RASH, SWELLING OR PAIN  UNUSUAL VAGINAL DISCHARGE OR ITCHING   Items with * indicate a potential emergency and should be followed up as soon as possible or go to the Emergency Department if any problems should occur.  Please show the CHEMOTHERAPY ALERT CARD or IMMUNOTHERAPY ALERT CARD at check-in  to the Emergency Department and triage nurse.  Should you have questions after your visit or need to cancel or reschedule your appointment, please contact [facility]  Dept: [phone]  and follow the prompts.  Office hours are 8:00 a.m. to 4:30 p.m. Monday - Friday. Please note that voicemails left after 4:00 p.m. may not be returned until the following business day.  We are closed weekends and major holidays. You have access to a nurse at all times for urgent questions. Please call the main number to the clinic Dept: [phone] and follow the prompts.   For any non-urgent questions, you may also contact your provider using MyChart. We now offer e-Visits for anyone 30 and older to request care online for non-urgent symptoms. For details visit [email or web address].   Also download the MyChart app! Go to the app store, search "MyChart", open the app, select [facility], and log in with your MyChart username and password.  Due to Covid, a mask is required upon entering the hospital/clinic. If you do not have a mask, one will be given to you upon arrival. For doctor visits, patients may have 1 support person aged 31 or older with them. For treatment visits, patients cannot have anyone with them due to current Covid guidelines and our immunocompromised population.

## 2021-09-21 NOTE — Progress Notes (Signed)
Per Lindi Adie MD, ok to proceed with yearly Zometa injection today using lab values drawn 08/31/2021.

## 2021-09-28 ENCOUNTER — Encounter: Payer: Self-pay | Admitting: Cardiology

## 2021-09-28 ENCOUNTER — Telehealth: Payer: No Typology Code available for payment source | Admitting: Cardiology

## 2021-09-28 ENCOUNTER — Other Ambulatory Visit: Payer: Self-pay

## 2021-09-28 ENCOUNTER — Telehealth: Payer: Self-pay | Admitting: *Deleted

## 2021-09-28 VITALS — Ht 66.0 in | Wt 258.0 lb

## 2021-09-28 DIAGNOSIS — G4733 Obstructive sleep apnea (adult) (pediatric): Secondary | ICD-10-CM

## 2021-09-28 DIAGNOSIS — I10 Essential (primary) hypertension: Secondary | ICD-10-CM

## 2021-09-28 NOTE — Progress Notes (Signed)
This encounter was created in error - please disregard.

## 2021-09-28 NOTE — Telephone Encounter (Signed)
Order placed to [employer] via community message 

## 2021-09-28 NOTE — Addendum Note (Signed)
Addended by: Fransico Him R on: 09/28/2021 10:19 AM ? ? Modules accepted: Orders, Level of Service ? ?

## 2021-09-28 NOTE — Telephone Encounter (Signed)
[facility] order PAP supplies ?

## 2021-09-28 NOTE — Progress Notes (Deleted)
? ?Virtual Visit via Video Note  ? ?This visit type was conducted due to national recommendations for restrictions regarding the COVID-19 Pandemic (e.g. social distancing) in an effort to limit this patient's exposure and mitigate transmission in our community.  Due to her co-morbid illnesses, this patient is at least at moderate risk for complications without adequate follow up.  This format is felt to be most appropriate for this patient at this time.  All issues noted in this document were discussed and addressed.  A limited physical exam was performed with this format.  Please refer to the patient's chart for her consent to telehealth for [facility]. ? ?Date:  09/28/2021  ? ?ID:  Grace Pennington, DOB [birth year], MRN [id number] ?The patient was identified using 2 identifiers. ? ?Patient Location: Home ?Provider Location: Home Office ? ? ?PCP:  Seward Carol, MD ?  ?[facility] Providers ?Cardiologist:  None    ? ?Evaluation Performed:  Follow-Up Visit ? ?Chief Complaint:  OSA ? ?History of Present Illness:   ? ?Grace ARMENDAREZ is a 51 y.o. female with a history of Breast CA, anxiety, depression, DM, GERD, HTN who was referred by Dr. Haroldine Laws for home sleep study due to obesity, nonrestorative sleep from time to time, morning headaches and snoring.  She was found to have moderate OSA with an AHI of 18.9/hr and no central events.  Lowest O2 sat was 77%.  She was started on auto CPAP and is now here for followup.   ? ?She is doing well with her CPAP device and thinks that she has gotten used to it.  She tolerates the full face mask and feels the pressure is adequate.  Since going on CPAP she feels rested in the am and has no significant daytime sleepiness.  She has a lot of problems with mouth dryness.  She denies any nasal dryness or nasal congestion.  She does not think that she snores.   Her strap broke the strap on her headgear and has not used it in 2 days.   ? ?The patient does not have symptoms  concerning for COVID-19 infection (fever, chills, cough, or new shortness of breath).  ? ? ?Past Medical History:  ?Diagnosis Date  ? Anxiety   ? Breast cancer in female [facility])   ? Right  ? Depression   ? Diabetes mellitus without complication ([place])   ? GERD (gastroesophageal reflux disease)   ? Hypertension   ? Palpitations   ? Sleep apnea   ? ?Past Surgical History:  ?Procedure Laterality Date  ? BREAST LUMPECTOMY WITH RADIOACTIVE SEED AND SENTINEL LYMPH NODE BIOPSY Right 10/16/2020  ? Procedure: RIGHT BREAST LUMPECTOMY WITH RADIOACTIVE SEED AND SENTINEL LYMPH NODE BIOPSY;  Surgeon: [name], MD;  Location: [place];  Service: General;  Laterality: Right;  ? PORTACATH PLACEMENT Left 10/16/2020  ? Procedure: INSERTION PORT-A-CATH;  Surgeon: [name], MD;  Location: [place];  Service: General;  Laterality: Left;  ? TUBAL LIGATION    ?  ? ?Current Meds  ?Medication Sig  ? ALPRAZolam (XANAX) 0.25 MG tablet Take 1 tablet by mouth once a day if needed  ? carvedilol (COREG) 3.125 MG tablet Take 1 tablet (3.125 mg total) by mouth 2 (two) times daily.  ? citalopram (CELEXA) 10 MG tablet TAKE 1 TABLET BY MOUTH ONCE DAILY  ? hydrochlorothiazide (HYDRODIURIL) 25 MG tablet TAKE 1 TABLET BY MOUTH ONCE DAILY  ? influenza vac split quadrivalent PF (FLUARIX) 0.5 ML injection Inject  0.5 mLs into the muscle.  ? letrozole (FEMARA) 2.5 MG tablet Take 1 tablet by mouth daily.  ? lidocaine-prilocaine (EMLA) cream APPLY TO AFFECTED AREA ONCE  ? loratadine (CLARITIN) 10 MG tablet Take 10 mg by mouth daily as needed for allergies.  ? losartan (COZAAR) 50 MG tablet Take 1 tablet (50 mg total) by mouth daily.  ? metFORMIN (GLUCOPHAGE) 1000 MG tablet Take 1,000 mg by mouth 2 (two) times daily with a meal.  ? Multiple Vitamins-Minerals (MULTIVITAMIN WITH MINERALS) tablet Take 1 tablet by mouth daily.  ? pantoprazole (PROTONIX) 40 MG tablet TAKE 1 TABLET BY MOUTH DAILY.  Need follow up with MD for more refills.  ? rosuvastatin (CRESTOR) 10  MG tablet Take 1 tablet by mouth once a day  ? [DISCONTINUED] rosuvastatin (CRESTOR) 10 MG tablet TAKE 1 TABLET BY MOUTH ONCE A DAY  ?  ? ?Allergies:   Patient has no known allergies.  ? ?Social History  ? ?Tobacco Use  ? Smoking status: Former  ?  Types: Cigarettes  ?  Quit date: 07/29/1996  ?  Years since quitting: 25.1  ? Smokeless tobacco: Never  ?Vaping Use  ? Vaping Use: Never used  ?Substance Use Topics  ? Alcohol use: Yes  ?  Comment: socially  ? Drug use: No  ?  ? ?Family Hx: ?The patient's family history includes Coronary artery disease in an other family member; Diabetes in her father, mother, and another family member; Heart disease in her mother; Hyperlipidemia in her father and mother; Hypertension in her father, mother, and another family member; Kidney failure in her mother and another family member; Sleep apnea in her father; Stroke in an other family member. ? ?ROS:   ?Please see the history of present illness.    ? ?All other systems reviewed and are negative. ? ? ?Prior CV studies:   ?The following studies were reviewed today: ? ?Home sleep study and PAP compliance download ? ?Labs/Other Tests and Data Reviewed:   ? ?EKG:  No ECG reviewed. ? ?Recent Labs: ?08/31/2021: ALT 25; BUN 19; Creatinine 1.01; Hemoglobin 12.5; Platelet Count 259; Potassium 3.9; Sodium 143  ? ?Recent Lipid Panel ?Lab Results  ?Component Value Date/Time  ? CHOL 192 08/13/2018 07:49 AM  ? TRIG 43 08/13/2018 07:49 AM  ? HDL 52 08/13/2018 07:49 AM  ? LDLCALC 131 (H) 08/13/2018 07:49 AM  ? ? ?Wt Readings from Last 3 Encounters:  ?09/28/21 258 lb (117 kg)  ?09/21/21 258 lb 6.4 oz (117.2 kg)  ?09/10/21 256 lb 2 oz (116.2 kg)  ?  ? ?Risk Assessment/Calculations:   ?  ? ?    ?Objective:   ? ?Vital Signs:  Ht 5\' 6"  (1.676 m)   Wt 258 lb (117 kg)   LMP 01/13/2018   BMI 41.64 kg/m?   ? ?VITAL SIGNS:  reviewed ?GEN:  no acute distress ?EYES:  sclerae anicteric, EOMI - Extraocular Movements Intact ?RESPIRATORY:  normal respiratory effort,  symmetric expansion ?CARDIOVASCULAR:  no peripheral edema ?SKIN:  no rash, lesions or ulcers. ?MUSCULOSKELETAL:  no obvious deformities. ?NEURO:  alert and oriented x 3, no obvious focal deficit ?PSYCH:  normal affect ? ?ASSESSMENT & PLAN:   ? ?OSA - The patient is tolerating PAP therapy well without any problems.  The patient has been using and benefiting from PAP use and will continue to benefit from therapy.  ?-I will get download from her DME ? ? HTN ?-BP is controlled at home 105/28mmHg ?-continue prescription drug  therapy with Carvedilol 3.125mg  BID, HCTZ 25mg  daily, Losartan 50mg  daily  ? ?   ?COVID-19 Education: ?The signs and symptoms of COVID-19 were discussed with the patient and how to seek care for testing (follow up with PCP or arrange E-visit).  The importance of social distancing was discussed today. ? ?Time:   ?Today, I have spent 15 minutes with the patient with telehealth technology discussing the above problems.   ? ? ?Medication Adjustments/Labs and Tests Ordered: ?Current medicines are reviewed at length with the patient today.  Concerns regarding medicines are outlined above.  ? ?Tests Ordered: ?No orders of the defined types were placed in this encounter. ? ? ?Medication Changes: ?No orders of the defined types were placed in this encounter. ? ? ?Follow Up:  In Person in 1 year(s) ? ?Signed, ?Fransico Him, MD  ?09/28/2021 9:49 AM    ?[place] ?

## 2021-09-29 ENCOUNTER — Other Ambulatory Visit (HOSPITAL_COMMUNITY): Payer: Self-pay

## 2021-10-01 ENCOUNTER — Encounter (HOSPITAL_COMMUNITY): Payer: No Typology Code available for payment source | Admitting: Internal Medicine

## 2021-10-01 ENCOUNTER — Telehealth: Payer: Self-pay | Admitting: Hematology and Oncology

## 2021-10-01 ENCOUNTER — Other Ambulatory Visit (HOSPITAL_COMMUNITY): Payer: No Typology Code available for payment source

## 2021-10-01 NOTE — Telephone Encounter (Signed)
Scheduled per 3/3 in basket, pt has been called and confirmed  ?

## 2021-10-04 ENCOUNTER — Ambulatory Visit (HOSPITAL_BASED_OUTPATIENT_CLINIC_OR_DEPARTMENT_OTHER)
Admission: RE | Admit: 2021-10-04 | Discharge: 2021-10-04 | Disposition: A | Discharge status: Home / Self Care | Payer: No Typology Code available for payment source | Source: Ambulatory Visit | Attending: Internal Medicine | Admitting: Internal Medicine

## 2021-10-04 ENCOUNTER — Other Ambulatory Visit (HOSPITAL_COMMUNITY): Payer: Self-pay

## 2021-10-04 ENCOUNTER — Ambulatory Visit (HOSPITAL_COMMUNITY)
Admission: RE | Admit: 2021-10-04 | Discharge: 2021-10-04 | Disposition: A | Discharge status: Home / Self Care | Payer: No Typology Code available for payment source | Source: Ambulatory Visit | Attending: Internal Medicine | Admitting: Internal Medicine

## 2021-10-04 ENCOUNTER — Other Ambulatory Visit: Payer: Self-pay

## 2021-10-04 VITALS — BP 120/76 | HR 61 | Wt 258.5 lb

## 2021-10-04 DIAGNOSIS — I509 Heart failure, unspecified: Secondary | ICD-10-CM | POA: Diagnosis not present

## 2021-10-04 DIAGNOSIS — Z170 Estrogen receptor positive status [ER+]: Secondary | ICD-10-CM | POA: Diagnosis not present

## 2021-10-04 DIAGNOSIS — F32A Depression, unspecified: Secondary | ICD-10-CM | POA: Diagnosis not present

## 2021-10-04 DIAGNOSIS — C50211 Malignant neoplasm of upper-inner quadrant of right female breast: Secondary | ICD-10-CM | POA: Diagnosis not present

## 2021-10-04 DIAGNOSIS — Z79899 Other long term (current) drug therapy: Secondary | ICD-10-CM | POA: Diagnosis not present

## 2021-10-04 DIAGNOSIS — I10 Essential (primary) hypertension: Secondary | ICD-10-CM | POA: Diagnosis not present

## 2021-10-04 DIAGNOSIS — Z9221 Personal history of antineoplastic chemotherapy: Secondary | ICD-10-CM | POA: Diagnosis not present

## 2021-10-04 DIAGNOSIS — Z01818 Encounter for other preprocedural examination: Secondary | ICD-10-CM | POA: Diagnosis not present

## 2021-10-04 DIAGNOSIS — G4733 Obstructive sleep apnea (adult) (pediatric): Secondary | ICD-10-CM | POA: Insufficient documentation

## 2021-10-04 DIAGNOSIS — T451X5A Adverse effect of antineoplastic and immunosuppressive drugs, initial encounter: Secondary | ICD-10-CM | POA: Insufficient documentation

## 2021-10-04 DIAGNOSIS — F419 Anxiety disorder, unspecified: Secondary | ICD-10-CM | POA: Diagnosis not present

## 2021-10-04 DIAGNOSIS — Z8249 Family history of ischemic heart disease and other diseases of the circulatory system: Secondary | ICD-10-CM | POA: Diagnosis not present

## 2021-10-04 DIAGNOSIS — Z9989 Dependence on other enabling machines and devices: Secondary | ICD-10-CM | POA: Insufficient documentation

## 2021-10-04 DIAGNOSIS — Z923 Personal history of irradiation: Secondary | ICD-10-CM | POA: Insufficient documentation

## 2021-10-04 DIAGNOSIS — I427 Cardiomyopathy due to drug and external agent: Secondary | ICD-10-CM | POA: Insufficient documentation

## 2021-10-04 DIAGNOSIS — I110 Hypertensive heart disease with heart failure: Secondary | ICD-10-CM | POA: Diagnosis not present

## 2021-10-04 LAB — ECHOCARDIOGRAM COMPLETE
AR max vel: 2.66 cm2
AV Peak grad: 6.3 mmHg
Ao pk vel: 1.25 m/s
Area-P 1/2: 4.6 cm2
S' Lateral: 3 cm

## 2021-10-04 MED ORDER — SPIRONOLACTONE 50 MG PO TABS
ORAL_TABLET | ORAL | 0 refills | Status: DC
  Filled 2021-10-04: qty 60, 30d supply, fill #0

## 2021-10-04 NOTE — Patient Instructions (Signed)
You have been referred to Pharmacy Clinic at [facility] for weight loss medication, they will call you to discuss ? ?Your physician recommends that you schedule a follow-up appointment in: 3 months with echocardiogram ? ?If you have any questions or concerns before your next appointment please send [place] a message through [place] or call our office at [phone].   ? ?TO LEAVE A MESSAGE FOR THE NURSE SELECT OPTION 2, PLEASE LEAVE A MESSAGE INCLUDING: ?YOUR NAME ?DATE OF BIRTH ?CALL BACK NUMBER ?REASON FOR CALL**this is important as we prioritize the call backs ? ?YOU WILL RECEIVE A CALL BACK THE SAME DAY AS LONG AS YOU CALL BEFORE 4:00 PM ? ? ?

## 2021-10-04 NOTE — Progress Notes (Signed)
Echocardiogram ?2D Echocardiogram has been performed. ? ?Grace Pennington ?10/04/2021, 1:30 PM ?

## 2021-10-04 NOTE — Progress Notes (Signed)
? ?CARDIO-ONCOLOGY CLINIC NOTE ? ?Referring Physician:Gudena, Vinay ?Primary Care:Polite,  Jori Moll ?Primary Cardiologist:Sora Olivo, Quillian Quince ? ?HPI:  Grace Pennington is 51 y.o. female with HTN, OSA, Depression, Anxiety,  Breast Cancer referred by Dr. Nicholas Lose for enrollment into the Cardio-Oncology program. ? ?09/08/2020: Stage IA (cT1b, cN0, cM0, G2, ER+, PR+, HER2+).  10/16/20 right lumpectomy high grade DCIS, clear margins, 4 right axillary lymph nodes negative for carcinoma.  Started on BREAST PACLITAXEL + TRASTUZUMAB Q7D / TRASTUZUMAB Q21D which she finished. She started on herceptin maintenance which will be completed 05/23.  Adjuvant radiation completed 09/22.  Also will be started on antiestrogen therapy.   ? ?10/2020 ECHO EF 60-65%, GLS -24.8% ?01/2021 ECHO EF 50-55% abnormal septal motion, GLS -18.9% partially brought down by apical 3C view, ? poor visualization of lateral wall.  Maybe subtle decrease in EF/increase in GLS but not clinically significant ?04/03/2021 ECHO EF 45-50%, GLS ~ -13% mild worsening in EF - >Herceptin held 9/22 due to dropping EF. Losartan added to carvedilol.  ? ?Echo 05/17/21 EF 55% G1DD GLS -16.1% GLS underestimated due to poor endocardial trackin on 3 and 4-chamber images. 2-chamber image looks more accurate at -18.9 -> Restarted herceptin 10/22.  ? ?Sleep study 10/22 with evidence of moderate OSA. ? ?Echo 07/20/21 EF 50-55% GLS -16.4% -> Herceptin continued. Losartan increased  ? ?Here today for f/u with echo. Feels good. Denies CP or SOB. Now wearing CPAP. Feels much more rested. No edema. Tolerating Herceptin.  ? ? ?Past Medical History:  ?Diagnosis Date  ? Anxiety   ? Breast cancer in female [facility])   ? Right  ? Depression   ? Diabetes mellitus without complication ([place])   ? GERD (gastroesophageal reflux disease)   ? Hypertension   ? Palpitations   ? Sleep apnea   ? ? ?Current Outpatient Medications  ?Medication Sig Dispense Refill  ? ALPRAZolam (XANAX) 0.25 MG tablet Take 1 tablet  by mouth once a day if needed 30 tablet 3  ? carvedilol (COREG) 3.125 MG tablet Take 1 tablet (3.125 mg total) by mouth 2 (two) times daily. 60 tablet 3  ? citalopram (CELEXA) 10 MG tablet TAKE 1 TABLET BY MOUTH ONCE DAILY 90 tablet 3  ? hydrochlorothiazide (HYDRODIURIL) 25 MG tablet TAKE 1 TABLET BY MOUTH ONCE DAILY 90 tablet 3  ? influenza vac split quadrivalent PF (FLUARIX) 0.5 ML injection Inject 0.5 mLs into the muscle. 0.5 mL 0  ? letrozole (FEMARA) 2.5 MG tablet Take 1 tablet by mouth daily. 90 tablet 3  ? lidocaine-prilocaine (EMLA) cream APPLY TO AFFECTED AREA ONCE 30 g 3  ? losartan (COZAAR) 50 MG tablet Take 1 tablet (50 mg total) by mouth daily. 90 tablet 2  ? metFORMIN (GLUCOPHAGE) 1000 MG tablet Take 1,000 mg by mouth 2 (two) times daily with a meal.    ? Multiple Vitamins-Minerals (MULTIVITAMIN WITH MINERALS) tablet Take 1 tablet by mouth daily.    ? pantoprazole (PROTONIX) 40 MG tablet TAKE 1 TABLET BY MOUTH DAILY.  Need follow up with MD for more refills. 90 tablet 1  ? rosuvastatin (CRESTOR) 10 MG tablet Take 1 tablet by mouth once a day 90 tablet 3  ? ?No current facility-administered medications for this encounter.  ? ? ?No Known Allergies ? ?  ?Social History  ? ?Socioeconomic History  ? Marital status: Single  ?  Spouse name: Not on file  ? Number of children: 2  ? Years of education: Not on file  ? Highest education level:  Not on file  ?Occupational History  ? Occupation: medical records  ?  Employer: [employer]  ?Tobacco Use  ? Smoking status: Former  ?  Types: Cigarettes  ?  Quit date: 07/29/1996  ?  Years since quitting: 25.2  ? Smokeless tobacco: Never  ?Vaping Use  ? Vaping Use: Never used  ?Substance and Sexual Activity  ? Alcohol use: Yes  ?  Comment: socially  ? Drug use: No  ? Sexual activity: Yes  ?  Birth control/protection: Surgical  ?Other Topics Concern  ? Not on file  ?Social History Narrative  ? Not on file  ? ?Social Determinants of Health  ? ?Financial Resource Strain: Not on  file  ?Food Insecurity: Not on file  ?Transportation Needs: Not on file  ?Physical Activity: Not on file  ?Stress: Not on file  ?Social Connections: Not on file  ?Intimate Partner Violence: Not At Risk  ? Fear of Current or Ex-Partner: No  ? Emotionally Abused: No  ? Physically Abused: No  ? Sexually Abused: No  ? ? ?  ?Family History  ?Problem Relation Age of Onset  ? Heart disease Mother   ? Diabetes Mother   ? Hyperlipidemia Mother   ? Hypertension Mother   ? Kidney failure Mother   ? Diabetes Other   ? Stroke Other   ? Hypertension Other   ? Kidney failure Other   ? Coronary artery disease Other   ? Sleep apnea Father   ? Diabetes Father   ? Hyperlipidemia Father   ? Hypertension Father   ? ? ?Vitals:  ? 10/04/21 1402  ?BP: 120/76  ?Pulse: 61  ?SpO2: 99%  ?Weight: 117.3 kg (258 lb 8 oz)  ? ? ? ?PHYSICAL EXAM: ?General:  Well appearing. No resp difficulty ?HEENT: normal ?Neck: supple. no JVD. Carotids 2+ bilat; no bruits. No lymphadenopathy or thryomegaly appreciated. + left port ?Cor: PMI nondisplaced. Regular rate & rhythm. No rubs, gallops or murmurs. ?Lungs: clear ?Abdomen: obesity soft, nontender, nondistended. No hepatosplenomegaly. No bruits or masses. Good bowel sounds. ?Extremities: no cyanosis, clubbing, rash, edema ?Neuro: alert & orientedx3, cranial nerves grossly intact. moves all 4 extremities w/o difficulty. Affect pleasant ? ? ?ASSESSMENT & PLAN: ? ?1. R Breast Cancer c/p chemo-induced cardiomyopathy (Herceptin) ?- 08/2020 diagnosed with Stage IA (cT1b, cN0, cM0, G2, ER+, PR+, HER2+) R Breast Cancer ?-10/2020 ECHO EF 60-65%, GLS -24.8% ?-01/2021 ECHO EF 50-55% abnormal septal motion, GLS -18.9% partially brought down by apical 3C view, ? poor visualization of lateral wall.  Maybe subtle decrease in EF/increase in GLS but not clinically significant ?-04/03/2021 ECHO EF 45-50%, GLS ~ -13% mild worsening in EF  ?-EF improved with holding Herceptin ?-Echo 05/17/21 EF 55% G1DD GLS -16.1% GLS underestimated  due to poor endocardial tracking on 3 and 4-chamber images. 2-chamber image looks more accurate at -18.9 ?-Back on herceptin after echo 10/22 ?- Echo12/23/22 EF 50-55% GLS -16.4% ?- Echo today 3/9/223 EF 50-55% GLS -17.0 ?- EF stable. Continue Herceptin (Finished 12/14/21) ?- Continue losartan and carvedilol. Was on spiro but OB/gyn stopped it. Would restart, if possible ? ?2. HTN ?- Blood pressure well controlled. Continue current regimen. ? ?3. OSA ?- Moderate on recent sleep study ?- Continue CPAP ? ?4. Obesity, morbid ?- will refer to PharmD Clinic for GLP-1RA ? ? ?Glori Bickers, MD  ?2:13 PM ? ?

## 2021-10-05 ENCOUNTER — Telehealth: Payer: Self-pay | Admitting: Pharmacist

## 2021-10-05 NOTE — Telephone Encounter (Signed)
Pt referred by Dr Haroldine Laws by PharmD to initiate [id number] therapy for weight loss. Pt has [employer] which typically covers [employer] well. Will submit prior authorization then call pt once determination is made to discuss. ?

## 2021-10-10 ENCOUNTER — Other Ambulatory Visit (HOSPITAL_COMMUNITY): Payer: Self-pay

## 2021-10-10 MED ORDER — WEGOVY 0.25 MG/0.5ML ~~LOC~~ SOAJ
0.2500 mg | SUBCUTANEOUS | 0 refills | Status: DC
  Filled 2021-10-10: qty 2, 28d supply, fill #0

## 2021-10-10 NOTE — Telephone Encounter (Signed)
[facility] prior authorization approved through 01/07/22 (PA states partial approval - they misread the PA form stating that we asked for a higher than approved quantity of 73m which we did not, a request for 4 pens was submitted which is correct). Called pt to discuss. No personal or family history of thyroid cancer. She is interested in starting [place] [facility] for first month sent to her pharmacy. Advised [facility]. Advised her to store rx in the fridge and bring with her to appt which has been scheduled with PharmD on 3/20. ?

## 2021-10-10 NOTE — Progress Notes (Signed)
? ?Patient Care Team: ?Seward Carol, MD as PCP - General (Internal Medicine) ?Nicholas Lose, MD as Consulting Physician (Hematology and Oncology) ?[name], MD as Consulting Physician (General Surgery) ?Kyung Rudd, MD as Consulting Physician (Radiation Oncology) ?Law, Bonnita Hollow, DO as Consulting Physician (Obstetrics and Gynecology) ? ?DIAGNOSIS:  ?Encounter Diagnosis  ?Name Primary?  ? Malignant neoplasm of upper-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, estrogen receptor positive ([place])   ? ? ?SUMMARY OF ONCOLOGIC HISTORY: ?Oncology History  ?Malignant neoplasm of upper-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, estrogen receptor positive ([place])  ?09/08/2020 Cancer Staging  ? Staging form: Breast, AJCC 8th Edition ?- Clinical stage from 09/08/2020: Stage IA (cT1b, cN0, cM0, G2, ER+, PR+, HER2+) - Signed by Gardenia Phlegm, NP on 09/20/2020 ?Stage prefix: Initial diagnosis ? ?  ?09/08/2020 Initial Diagnosis  ? Mammogram detected 0.7 cm mass in the right breast 1 o'clock position, right breast biopsy 1: Grade 2 IDC ER 95% PR 95%, Ki-67 30%, HER-2 positive ratio 2.83 ?  ?10/16/2020 Surgery  ? Right lumpectomy [name]): IDC, 1.0cm, grade 3, with high grade DCIS, clear margins, 4 right axillary lymph nodes negative for carcinoma. ?  ?10/16/2020 Cancer Staging  ? Staging form: Breast, AJCC 8th Edition ?- Pathologic stage from 10/16/2020: Stage IA (pT1b, pN0, cM0, G3, ER+, PR+, HER2+) - Signed by Gardenia Phlegm, NP on 11/01/2020 ?Histologic grading system: 3 grade system ? ?  ?11/13/2020 -  Chemotherapy  ? Patient is on Treatment Plan : BREAST Paclitaxel + Trastuzumab q7d / Trastuzumab q21d  ?   ? ? ?CHIEF COMPLIANT: Cycle 11 Herceptin ? ?INTERVAL HISTORY: Grace Pennington is a 51 y.o. with above-mentioned history of right breast cancer who underwent a right lumpectomy and is currently on Herceptin. She presents to the clinic today for treatment.  ? ? ?ALLERGIES:  has No Known Allergies. ? ?MEDICATIONS:   ?Current Outpatient Medications  ?Medication Sig Dispense Refill  ? ALPRAZolam (XANAX) 0.25 MG tablet Take 1 tablet by mouth once a day if needed 30 tablet 3  ? carvedilol (COREG) 3.125 MG tablet Take 1 tablet (3.125 mg total) by mouth 2 (two) times daily. 60 tablet 3  ? citalopram (CELEXA) 10 MG tablet TAKE 1 TABLET BY MOUTH ONCE DAILY 90 tablet 3  ? hydrochlorothiazide (HYDRODIURIL) 25 MG tablet TAKE 1 TABLET BY MOUTH ONCE DAILY 90 tablet 3  ? influenza vac split quadrivalent PF (FLUARIX) 0.5 ML injection Inject 0.5 mLs into the muscle. 0.5 mL 0  ? letrozole (FEMARA) 2.5 MG tablet Take 1 tablet by mouth daily. 90 tablet 3  ? lidocaine-prilocaine (EMLA) cream APPLY TO AFFECTED AREA ONCE 30 g 3  ? losartan (COZAAR) 50 MG tablet Take 1 tablet (50 mg total) by mouth daily. 90 tablet 2  ? metFORMIN (GLUCOPHAGE) 1000 MG tablet Take 1,000 mg by mouth 2 (two) times daily with a meal.    ? Multiple Vitamins-Minerals (MULTIVITAMIN WITH MINERALS) tablet Take 1 tablet by mouth daily.    ? pantoprazole (PROTONIX) 40 MG tablet TAKE 1 TABLET BY MOUTH DAILY.  Need follow up with MD for more refills. 90 tablet 1  ? rosuvastatin (CRESTOR) 10 MG tablet Take 1 tablet by mouth once a day 90 tablet 3  ? spironolactone (ALDACTONE) 50 MG tablet Take 1 tablet by mouth twice a day 60 tablet 0  ? WEGOVY 0.25 MG/0.5ML SOAJ Inject 0.25 mg into the skin once a week. 2 mL 0  ? ?No current facility-administered medications for  this visit.  ? ? ?PHYSICAL EXAMINATION: ?ECOG PERFORMANCE STATUS: 1 - Symptomatic but completely ambulatory ? ?Vitals:  ? 10/12/21 0839  ?BP: 103/79  ?Pulse: 80  ?Resp: 17  ?Temp: (!) 97.3 ?F (36.3 ?C)  ?SpO2: 98%  ? ?Filed Weights  ? 10/12/21 0839  ?Weight: 253 lb 3.2 oz (114.9 kg)  ? ?  ? ?LABORATORY DATA:  ?I have reviewed the data as listed ?CMP Latest Ref Rng & Units 08/31/2021 08/10/2021 07/20/2021  ?Glucose 70 - 99 mg/dL 97 102(H) 102(H)  ?BUN 6 - 20 mg/dL _0 ?Creatinine 0.44 - 1.00 mg/dL 1.01(H) 0.89 1.02(H)   ?Sodium 135 - 145 mmol/L 143 140 140  ?Potassium 3.5 - 5.1 mmol/L 3.9 3.5 3.7  ?Chloride 98 - 111 mmol/L 107 103 104  ?CO2 22 - 32 mmol/L _1 ?Calcium 8.9 - 10.3 mg/dL 9.6 9.4 9.8  ?Total Protein 6.5 - 8.1 g/dL 7.6 7.7 7.6  ?Total Bilirubin 0.3 - 1.2 mg/dL 0.3 0.4 0.3  ?Alkaline Phos 38 - 126 U/L 60 67 69  ?AST 15 - 41 U/L _2 ?ALT 0 - 44 U/L _3 ? ? ?Lab Results  ?Component Value Date  ? WBC 5.2 10/12/2021  ? HGB 13.3 10/12/2021  ? HCT 39.7 10/12/2021  ? MCV 84.5 10/12/2021  ? PLT 255 10/12/2021  ? NEUTROABS 2.9 10/12/2021  ? ? ?ASSESSMENT & PLAN:  ?Malignant neoplasm of upper-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, estrogen receptor positive ([place]) ?09/08/2020:Mammogram detected 0.7 cm mass in the right breast 1 o'clock position, right breast biopsy 1: Grade 2 IDC ER 95% PR 95%, Ki-67 30%, HER-2 positive ratio 2.83 ?T1b N0 stage Ia ?  ?10/16/20: Grade 3 IDC with DCIS 0/4 LN  ER 95% PR 95%, Ki-67 30%, HER-2 positive ratio 2.83 ?  ?Treatment Plan: ?1. adjuvant Taxol Herceptin followed by Herceptin maintenance for 1 year plan to start in 3 weeks completed 02/02/2021 ?2.  Adjuvant radiation completed 04/04/2021 ?3.  Followed by adjuvant antiestrogen therapy with letrozole started 07/20/2021 (patient went to menopause at the age of 51) ?-------------------------------------------------------------------------------------------------------------------------- ?Current treatment: Herceptin maintenance to be completed 12/14/2021 ?Toxicities: None ?Echocardiogram 05/17/2021: EF 55% ?Return to clinic every 3 weeks for Herceptin every 6 weeks for follow-up with me. ? ?Letrozole toxicities:  ?Denies any adverse effects ?Body aches and pains: I instructed her to take turmeric. ?Planned treatment duration is 7 years. ?  ?Bone density: 08/30/2021: Osteoporosis T score -2.5: We will add Reclast infusion to her next Herceptin treatment.  She will receive this once a year.  She takes calcium and vitamin D. ?  ?Mammograms:  January 2023: Benign ?Return to clinic every 3 weeks for Herceptin every 6 weeks with follow-up with me. ?I sent a message to Dr. [name] for port removal after May 20. ?She has both a vacation to [place] in August to celebrate her 51th birthday and completion of treatment. ? ? ?No orders of the defined types were placed in this encounter. ? ?The patient has a good understanding of the overall plan. she agrees with it. she will call with any problems that may develop before the next visit here. ?Total time spent: 30 mins including face to face time and time spent for planning, charting and co-ordination of care ? ? Harriette Ohara, MD ?10/12/21 ? ? ? I, Gardiner Coins, am acting as a scribe for Dr. Lindi Adie  ?

## 2021-10-11 ENCOUNTER — Encounter: Payer: Self-pay | Admitting: Hematology and Oncology

## 2021-10-11 ENCOUNTER — Ambulatory Visit: Payer: No Typology Code available for payment source | Admitting: Hematology and Oncology

## 2021-10-11 ENCOUNTER — Ambulatory Visit: Payer: No Typology Code available for payment source

## 2021-10-11 ENCOUNTER — Other Ambulatory Visit: Payer: No Typology Code available for payment source

## 2021-10-11 ENCOUNTER — Other Ambulatory Visit (HOSPITAL_COMMUNITY): Payer: Self-pay

## 2021-10-12 ENCOUNTER — Inpatient Hospital Stay: Payer: No Typology Code available for payment source | Attending: Hematology and Oncology

## 2021-10-12 ENCOUNTER — Inpatient Hospital Stay: Payer: No Typology Code available for payment source

## 2021-10-12 ENCOUNTER — Other Ambulatory Visit: Payer: Self-pay

## 2021-10-12 ENCOUNTER — Inpatient Hospital Stay (HOSPITAL_BASED_OUTPATIENT_CLINIC_OR_DEPARTMENT_OTHER): Payer: No Typology Code available for payment source | Admitting: Hematology and Oncology

## 2021-10-12 DIAGNOSIS — C50211 Malignant neoplasm of upper-inner quadrant of right female breast: Secondary | ICD-10-CM | POA: Insufficient documentation

## 2021-10-12 DIAGNOSIS — Z95828 Presence of other vascular implants and grafts: Secondary | ICD-10-CM

## 2021-10-12 DIAGNOSIS — M810 Age-related osteoporosis without current pathological fracture: Secondary | ICD-10-CM | POA: Diagnosis not present

## 2021-10-12 DIAGNOSIS — Z170 Estrogen receptor positive status [ER+]: Secondary | ICD-10-CM | POA: Insufficient documentation

## 2021-10-12 DIAGNOSIS — Z5112 Encounter for antineoplastic immunotherapy: Secondary | ICD-10-CM | POA: Insufficient documentation

## 2021-10-12 LAB — CMP (CANCER CENTER ONLY)
ALT: 21 U/L (ref 0–44)
AST: 20 U/L (ref 15–41)
Albumin: 4.3 g/dL (ref 3.5–5.0)
Alkaline Phosphatase: 60 U/L (ref 38–126)
Anion gap: 8 (ref 5–15)
BUN: 17 mg/dL (ref 6–20)
CO2: 29 mmol/L (ref 22–32)
Calcium: 10.1 mg/dL (ref 8.9–10.3)
Chloride: 104 mmol/L (ref 98–111)
Creatinine: 1.09 mg/dL — ABNORMAL HIGH (ref 0.44–1.00)
GFR, Estimated: >60 mL/min (ref >60)
Glucose, Bld: 132 mg/dL — ABNORMAL HIGH (ref 70–99)
Potassium: 3.7 mmol/L (ref 3.5–5.1)
Sodium: 141 mmol/L (ref 135–145)
Total Bilirubin: 0.4 mg/dL (ref 0.3–1.2)
Total Protein: 8 g/dL (ref 6.5–8.1)

## 2021-10-12 LAB — CBC WITH DIFFERENTIAL (CANCER CENTER ONLY)
Abs Immature Granulocytes: 0.01 10*3/uL (ref 0.00–0.07)
Basophils Absolute: 0 10*3/uL (ref 0.0–0.1)
Basophils Relative: 1 %
Eosinophils Absolute: 0.3 10*3/uL (ref 0.0–0.5)
Eosinophils Relative: 5 %
HCT: 39.7 % (ref 36.0–46.0)
Hemoglobin: 13.3 g/dL (ref 12.0–15.0)
Immature Granulocytes: 0 %
Lymphocytes Relative: 26 %
Lymphs Abs: 1.3 10*3/uL (ref 0.7–4.0)
MCH: 28.3 pg (ref 26.0–34.0)
MCHC: 33.5 g/dL (ref 30.0–36.0)
MCV: 84.5 fL (ref 80.0–100.0)
Monocytes Absolute: 0.6 10*3/uL (ref 0.1–1.0)
Monocytes Relative: 11 %
Neutro Abs: 2.9 10*3/uL (ref 1.7–7.7)
Neutrophils Relative %: 57 %
Platelet Count: 255 10*3/uL (ref 150–400)
RBC: 4.7 MIL/uL (ref 3.87–5.11)
RDW: 14.6 % (ref 11.5–15.5)
WBC Count: 5.2 10*3/uL (ref 4.0–10.5)
nRBC: 0 % (ref 0.0–0.2)

## 2021-10-12 MED ORDER — SODIUM CHLORIDE 0.9 % IV SOLN: Freq: Once | INTRAVENOUS | Status: AC

## 2021-10-12 MED ORDER — HEPARIN SOD (PORK) LOCK FLUSH 100 UNIT/ML IV SOLN
500.0000 [IU] | Freq: Once | INTRAVENOUS | Status: AC | PRN
  Administered 2021-10-12: 500 [IU]

## 2021-10-12 MED ORDER — ZOLEDRONIC ACID 4 MG/100ML IV SOLN
4.0000 mg | Freq: Once | INTRAVENOUS | Status: AC
  Administered 2021-10-12: 4 mg via INTRAVENOUS
  Filled 2021-10-12: qty 100

## 2021-10-12 MED ORDER — SODIUM CHLORIDE 0.9% FLUSH
10.0000 mL | INTRAVENOUS | Status: DC | PRN
  Administered 2021-10-12: 10 mL

## 2021-10-12 MED ORDER — SODIUM CHLORIDE 0.9% FLUSH
10.0000 mL | Freq: Once | INTRAVENOUS | Status: AC
  Administered 2021-10-12: 10 mL

## 2021-10-12 MED ORDER — ACETAMINOPHEN 325 MG PO TABS
650.0000 mg | ORAL_TABLET | Freq: Once | ORAL | Status: AC
  Administered 2021-10-12: 650 mg via ORAL
  Filled 2021-10-12: qty 2

## 2021-10-12 MED ORDER — DIPHENHYDRAMINE HCL 25 MG PO CAPS
50.0000 mg | ORAL_CAPSULE | Freq: Once | ORAL | Status: AC
  Administered 2021-10-12: 50 mg via ORAL
  Filled 2021-10-12: qty 2

## 2021-10-12 MED ORDER — TRASTUZUMAB-DKST CHEMO 150 MG IV SOLR
6.0000 mg/kg | Freq: Once | INTRAVENOUS | Status: AC
  Administered 2021-10-12: 693 mg via INTRAVENOUS
  Filled 2021-10-12: qty 33

## 2021-10-12 MED ORDER — SODIUM CHLORIDE 0.9 % IV SOLN: Freq: Once | INTRAVENOUS | Status: DC

## 2021-10-12 NOTE — Assessment & Plan Note (Signed)
09/08/2020:Mammogram detected 0.7 cm mass in the right breast 1 o'clock position, right breast biopsy 1: Grade 2 IDC ER 95% PR 95%, Ki-67 30%, HER-2 positive ratio 2.83 ?T1b N0 stage Ia ?? ?10/16/20: Grade 3 IDC with DCIS 0/4 LN??ER 95% PR 95%, Ki-67 30%, HER-2 positive ratio 2.83 ?? ?Treatment Plan: ?1.?adjuvant Taxol Herceptin followed by Herceptin maintenance for 1 year?plan to start in 3 weeks?completed 02/02/2021 ?2.??Adjuvant radiation?completed 04/04/2021 ?3.??Followed by adjuvant antiestrogen therapy?with letrozole started 07/20/2021 (patient went to menopause at the age of 60) ?-------------------------------------------------------------------------------------------------------------------------- ?Current treatment: Herceptin maintenance?to be completed 12/14/2021 ?Toxicities: None ?Echocardiogram 05/17/2021: EF 55% ?Return to clinic every 3 weeks for Herceptin every 6 weeks for follow-up with me. ? ?Letrozole toxicities:? ?Denies any adverse effects ?Denies any hot flashes or arthralgias or myalgias. ?Planned treatment duration is 7?years. ?? ?Bone density: 08/30/2021: Osteoporosis T score -2.5: We will add Reclast infusion to her next Herceptin treatment.  She will receive this once a year.  She takes calcium and vitamin D. ?? ?Mammograms: January 2023: Benign ?Return to clinic every 3 weeks for Herceptin every 6 weeks with follow-up with me. ?

## 2021-10-12 NOTE — Patient Instructions (Addendum)
[facility]  Discharge Instructions: ?Thank you for choosing [facility] to provide your oncology and hematology care.  ? ?If you have a lab appointment with the [facility], please go directly to the [facility] and check in at the registration area. ?  ?Wear comfortable clothing and clothing appropriate for easy access to any Portacath or PICC line.  ? ?We strive to give you quality time with your provider. You may need to reschedule your appointment if you arrive late (15 or more minutes).  Arriving late affects you and other patients whose appointments are after yours.  Also, if you miss three or more appointments without notifying the office, you may be dismissed from the clinic at the provider?s discretion.    ?  ?For prescription refill requests, have your pharmacy contact our office and allow 72 hours for refills to be completed.   ? ?Today you received the following chemotherapy and/or immunotherapy agents: trastuzumab    ?  ?To help prevent nausea and vomiting after your treatment, we encourage you to take your nausea medication as directed. ? ?BELOW ARE SYMPTOMS THAT SHOULD BE REPORTED IMMEDIATELY: ?*FEVER GREATER THAN 100.4 F (38 ?C) OR HIGHER ?*CHILLS OR SWEATING ?*NAUSEA AND VOMITING THAT IS NOT CONTROLLED WITH YOUR NAUSEA MEDICATION ?*UNUSUAL SHORTNESS OF BREATH ?*UNUSUAL BRUISING OR BLEEDING ?*URINARY PROBLEMS (pain or burning when urinating, or frequent urination) ?*BOWEL PROBLEMS (unusual diarrhea, constipation, pain near the anus) ?TENDERNESS IN MOUTH AND THROAT WITH OR WITHOUT PRESENCE OF ULCERS (sore throat, sores in mouth, or a toothache) ?UNUSUAL RASH, SWELLING OR PAIN  ?UNUSUAL VAGINAL DISCHARGE OR ITCHING  ? ?Items with * indicate a potential emergency and should be followed up as soon as possible or go to the Emergency Department if any problems should occur. ? ?Please show the CHEMOTHERAPY ALERT CARD or IMMUNOTHERAPY ALERT CARD at check-in  to the Emergency Department and triage nurse. ? ?Should you have questions after your visit or need to cancel or reschedule your appointment, please contact [facility]  Dept: [phone]  and follow the prompts.  Office hours are 8:00 a.m. to 4:30 p.m. Monday - Friday. Please note that voicemails left after 4:00 p.m. may not be returned until the following business day.  We are closed weekends and major holidays. You have access to a nurse at all times for urgent questions. Please call the main number to the clinic Dept: [phone] and follow the prompts. ? ? ?For any non-urgent questions, you may also contact your provider using MyChart. We now offer e-Visits for anyone 76 and older to request care online for non-urgent symptoms. For details visit [email or web address]. ?  ?Also download the MyChart app! Go to the app store, search "MyChart", open the app, select [facility], and log in with your MyChart username and password. ? ?Due to Covid, a mask is required upon entering the hospital/clinic. If you do not have a mask, one will be given to you upon arrival. For doctor visits, patients may have 1 support person aged 17 or older with them. For treatment visits, patients cannot have anyone with them due to current Covid guidelines and our immunocompromised population.  ? ?Zoledronic Acid Injection (Hypercalcemia, Oncology) ?What is this medication? ?ZOLEDRONIC ACID (ZOE le dron ik AS id) slows calcium loss from bones. It high calcium levels in the blood from some kinds of cancer. It may be used in other people at risk for bone loss. ?This medicine may  be used for other purposes; ask your health care provider or pharmacist if you have questions. ?COMMON BRAND NAME(S): Zometa ?What should I tell my care team before I take this medication? ?They need to know if you have any of these conditions: ?cancer ?dehydration ?dental disease ?kidney disease ?liver disease ?low levels of  calcium in the blood ?lung or breathing disease (asthma) ?receiving steroids like dexamethasone or prednisone ?an unusual or allergic reaction to zoledronic acid, other medicines, foods, dyes, or preservatives ?pregnant or trying to get pregnant ?breast-feeding ?How should I use this medication? ?This drug is injected into a vein. It is given by a health care provider in a hospital or clinic setting. ?Talk to your health care provider about the use of this drug in children. Special care may be needed. ?Overdosage: If you think you have taken too much of this medicine contact a poison control center or emergency room at once. ?NOTE: This medicine is only for you. Do not share this medicine with others. ?What if I miss a dose? ?Keep appointments for follow-up doses. It is important not to miss your dose. Call your health care provider if you are unable to keep an appointment. ?What may interact with this medication? ?certain antibiotics given by injection ?NSAIDs, medicines for pain and inflammation, like ibuprofen or naproxen ?some diuretics like bumetanide, furosemide ?teriparatide ?thalidomide ?This list may not describe all possible interactions. Give your health care provider a list of all the medicines, herbs, non-prescription drugs, or dietary supplements you use. Also tell them if you smoke, drink alcohol, or use illegal drugs. Some items may interact with your medicine. ?What should I watch for while using this medication? ?Visit your health care provider for regular checks on your progress. It may be some time before you see the benefit from this drug. ?Some people who take this drug have severe bone, joint, or muscle pain. This drug may also increase your risk for jaw problems or a broken thigh bone. Tell your health care provider right away if you have severe pain in your jaw, bones, joints, or muscles. Tell you health care provider if you have any pain that does not go away or that gets worse. ?Tell your  dentist and dental surgeon that you are taking this drug. You should not have major dental surgery while on this drug. See your dentist to have a dental exam and fix any dental problems before starting this drug. Take good care of your teeth while on this drug. Make sure you see your dentist for regular follow-up appointments. ?You should make sure you get enough calcium and vitamin D while you are taking this drug. Discuss the foods you eat and the vitamins you take with your health care provider. ?Check with your health care provider if you have severe diarrhea, nausea, and vomiting, or if you sweat a lot. The loss of too much body fluid may make it dangerous for you to take this drug. ?You may need blood work done while you are taking this drug. ?Do not become pregnant while taking this drug. Women should inform their health care provider if they wish to become pregnant or think they might be pregnant. There is potential for serious harm to an unborn child. Talk to your health care provider for more information. ?What side effects may I notice from receiving this medication? ?Side effects that you should report to your doctor or health care provider as soon as possible: ?allergic reactions (skin rash, itching or hives;  swelling of the face, lips, or tongue) ?bone pain ?infection (fever, chills, cough, sore throat, pain or trouble passing urine) ?jaw pain, especially after dental work ?joint pain ?kidney injury (trouble passing urine or change in the amount of urine) ?low blood pressure (dizziness; feeling faint or lightheaded, falls; unusually weak or tired) ?low calcium levels (fast heartbeat; muscle cramps or pain; pain, tingling, or numbness in the hands or feet; seizures) ?low magnesium levels (fast, irregular heartbeat; muscle cramp or pain; muscle weakness; tremors; seizures) ?low red blood cell counts (trouble breathing; feeling faint; lightheaded, falls; unusually weak or tired) ?muscle pain ?redness,  blistering, peeling, or loosening of the skin, including inside the mouth ?severe diarrhea ?swelling of the ankles, feet, hands ?trouble breathing ?Side effects that usually do not require medical attention (r

## 2021-10-12 NOTE — Progress Notes (Signed)
Pt did not received Zoledronic Acid at last infusion appt. This RN reached out to pharmacy, insurance has approved and pt will receive dose today.  ?

## 2021-10-15 ENCOUNTER — Ambulatory Visit: Payer: No Typology Code available for payment source

## 2021-10-15 NOTE — Progress Notes (Deleted)
Patient ID: JADENE STEMMER                 DOB: [birth year]                    MRN: [id number] ? ? ? ? ?HPI: ?Grace Pennington is a 51 y.o. female patient referred to pharmacy clinic by Dr. Haroldine Laws to initiate weight loss therapy with GLP1-RA. PMH is significant for obesity complicated by chronic medical conditions including HTN, OSA, Depression, Anxiety,  Breast Cancer. Most recent BMI ***. ? ?*** If diabetic and on insulin/sulfonylurea, can consider reducing dose to reduce risk of hypoglycemia ? ?*** Follow-up visit  ?Assess % weight loss ?Assess adverse effects ?Missed doses ? ?Current weight management medications:  ? ?Previously tried meds: ? ?Current meds that may affect weight: ? ?Baseline weight/BMI: ? ?Insurance payor:  ? ?Diet:  ?-Breakfast: ?-Lunch: ?-Dinner: ?-Snacks: ?-Drinks: ? ?Exercise:  ? ?Family History:  ? ?Social History:  ? ?Labs: ?07/10/21 A1C 6.5, LDL-C 73, TG 77 ? ?Lab Results  ?Component Value Date  ? HGBA1C 6.2 (H) 08/13/2018  ? ? ?Wt Readings from Last 1 Encounters:  ?10/12/21 253 lb 3.2 oz (114.9 kg)  ? ? ?BP Readings from Last 1 Encounters:  ?10/12/21 103/79  ? ?Pulse Readings from Last 1 Encounters:  ?10/12/21 80  ? ? ?   ?Component Value Date/Time  ? CHOL 192 08/13/2018 0749  ? TRIG 43 08/13/2018 0749  ? HDL 52 08/13/2018 0749  ? [place] 131 (H) 08/13/2018 0749  ? ? ?Past Medical History:  ?Diagnosis Date  ? Anxiety   ? Breast cancer in female [facility])   ? Right  ? Depression   ? Diabetes mellitus without complication ([place])   ? GERD (gastroesophageal reflux disease)   ? Hypertension   ? Palpitations   ? Sleep apnea   ? ? ?Current Outpatient Medications on File Prior to Visit  ?Medication Sig Dispense Refill  ? ALPRAZolam (XANAX) 0.25 MG tablet Take 1 tablet by mouth once a day if needed 30 tablet 3  ? carvedilol (COREG) 3.125 MG tablet Take 1 tablet (3.125 mg total) by mouth 2 (two) times daily. 60 tablet 3  ? citalopram (CELEXA) 10 MG tablet TAKE 1 TABLET BY MOUTH ONCE DAILY 90 tablet 3  ?  hydrochlorothiazide (HYDRODIURIL) 25 MG tablet TAKE 1 TABLET BY MOUTH ONCE DAILY 90 tablet 3  ? influenza vac split quadrivalent PF (FLUARIX) 0.5 ML injection Inject 0.5 mLs into the muscle. 0.5 mL 0  ? letrozole (FEMARA) 2.5 MG tablet Take 1 tablet by mouth daily. 90 tablet 3  ? lidocaine-prilocaine (EMLA) cream APPLY TO AFFECTED AREA ONCE 30 g 3  ? losartan (COZAAR) 50 MG tablet Take 1 tablet (50 mg total) by mouth daily. 90 tablet 2  ? metFORMIN (GLUCOPHAGE) 1000 MG tablet Take 1,000 mg by mouth 2 (two) times daily with a meal.    ? Multiple Vitamins-Minerals (MULTIVITAMIN WITH MINERALS) tablet Take 1 tablet by mouth daily.    ? pantoprazole (PROTONIX) 40 MG tablet TAKE 1 TABLET BY MOUTH DAILY.  Need follow up with MD for more refills. 90 tablet 1  ? rosuvastatin (CRESTOR) 10 MG tablet Take 1 tablet by mouth once a day 90 tablet 3  ? spironolactone (ALDACTONE) 50 MG tablet Take 1 tablet by mouth twice a day 60 tablet 0  ? WEGOVY 0.25 MG/0.5ML SOAJ Inject 0.25 mg into the skin once a week. 2 mL 0  ? ?No current  facility-administered medications on file prior to visit.  ? ? ?No Known Allergies ? ? ?Assessment/Plan: ? ?1. Weight loss - Patient has not met goal of at least 5% of body weight loss with comprehensive lifestyle modifications alone in the past 3-6 months. Pharmacotherapy is appropriate to pursue as augmentation. Will start ***. Confirmed patient not ***pregnant and no personal or family history of medullary thyroid carcinoma (MTC) or Multiple Endocrine Neoplasia syndrome type 2 (MEN 2).  ? ?Advised patient on common side effects including nausea, diarrhea, dyspepsia, decreased appetite, and fatigue. Counseled patient on reducing meal size and how to titrate medication to minimize side effects. Counseled patient to call if intolerable side effects or if experiencing dehydration, abdominal pain, or dizziness. Patient will adhere to dietary modifications and will target at least 150 minutes of moderate  intensity exercise weekly.  ? ?Injection technique reviewed at today's visit and patient successfully self-administered first dose of *** into the fatty tissue of the abdomen. ? ?Titration Plan:  ?Will plan to follow the titration plan as below, pending patient is tolerating each dose before increasing to the next. Can slow titration if needed for tolerability.  ?  ?-Month 1: Inject *** SQ once weekly x 4 weeks ?-Month 2: Inject *** SQ once weekly x 4 weeks ?-Month 3: Inject *** SQ once weekly x 4 weeks ?-Month 4+: Inject *** SQ once weekly  ? ?Follow up in ***. ? ?

## 2021-10-22 ENCOUNTER — Other Ambulatory Visit (HOSPITAL_COMMUNITY): Payer: Self-pay

## 2021-10-30 ENCOUNTER — Other Ambulatory Visit (HOSPITAL_COMMUNITY): Payer: Self-pay

## 2021-11-02 ENCOUNTER — Other Ambulatory Visit: Payer: No Typology Code available for payment source

## 2021-11-02 ENCOUNTER — Inpatient Hospital Stay: Payer: No Typology Code available for payment source | Attending: Hematology and Oncology

## 2021-11-02 ENCOUNTER — Other Ambulatory Visit: Payer: Self-pay

## 2021-11-02 VITALS — BP 119/81 | HR 75 | Temp 98.2°F | Resp 16 | Wt 254.8 lb

## 2021-11-02 DIAGNOSIS — C50211 Malignant neoplasm of upper-inner quadrant of right female breast: Secondary | ICD-10-CM | POA: Diagnosis present

## 2021-11-02 DIAGNOSIS — Z5112 Encounter for antineoplastic immunotherapy: Secondary | ICD-10-CM | POA: Insufficient documentation

## 2021-11-02 DIAGNOSIS — Z170 Estrogen receptor positive status [ER+]: Secondary | ICD-10-CM | POA: Insufficient documentation

## 2021-11-02 MED ORDER — DIPHENHYDRAMINE HCL 25 MG PO CAPS
50.0000 mg | ORAL_CAPSULE | Freq: Once | ORAL | Status: AC
  Administered 2021-11-02: 50 mg via ORAL
  Filled 2021-11-02: qty 2

## 2021-11-02 MED ORDER — ACETAMINOPHEN 325 MG PO TABS
650.0000 mg | ORAL_TABLET | Freq: Once | ORAL | Status: AC
  Administered 2021-11-02: 650 mg via ORAL
  Filled 2021-11-02: qty 2

## 2021-11-02 MED ORDER — HEPARIN SOD (PORK) LOCK FLUSH 100 UNIT/ML IV SOLN
500.0000 [IU] | Freq: Once | INTRAVENOUS | Status: AC | PRN
  Administered 2021-11-02: 500 [IU]

## 2021-11-02 MED ORDER — TRASTUZUMAB-DKST CHEMO 150 MG IV SOLR
6.0000 mg/kg | Freq: Once | INTRAVENOUS | Status: AC
  Administered 2021-11-02: 693 mg via INTRAVENOUS
  Filled 2021-11-02: qty 33

## 2021-11-02 MED ORDER — SODIUM CHLORIDE 0.9% FLUSH
10.0000 mL | INTRAVENOUS | Status: DC | PRN
  Administered 2021-11-02: 10 mL

## 2021-11-02 MED ORDER — SODIUM CHLORIDE 0.9 % IV SOLN: Freq: Once | INTRAVENOUS | Status: AC

## 2021-11-02 NOTE — Patient Instructions (Signed)
[facility]  Discharge Instructions: ?Thank you for choosing [facility] to provide your oncology and hematology care.  ? ?If you have a lab appointment with the [facility], please go directly to the [facility] and check in at the registration area. ?  ?Wear comfortable clothing and clothing appropriate for easy access to any Portacath or PICC line.  ? ?We strive to give you quality time with your provider. You may need to reschedule your appointment if you arrive late (15 or more minutes).  Arriving late affects you and other patients whose appointments are after yours.  Also, if you miss three or more appointments without notifying the office, you may be dismissed from the clinic at the provider?s discretion.    ?  ?For prescription refill requests, have your pharmacy contact our office and allow 72 hours for refills to be completed.   ? ?Today you received the following chemotherapy and/or immunotherapy agents: Traztuzumab.     ?  ?To help prevent nausea and vomiting after your treatment, we encourage you to take your nausea medication as directed. ? ?BELOW ARE SYMPTOMS THAT SHOULD BE REPORTED IMMEDIATELY: ?*FEVER GREATER THAN 100.4 F (38 ?C) OR HIGHER ?*CHILLS OR SWEATING ?*NAUSEA AND VOMITING THAT IS NOT CONTROLLED WITH YOUR NAUSEA MEDICATION ?*UNUSUAL SHORTNESS OF BREATH ?*UNUSUAL BRUISING OR BLEEDING ?*URINARY PROBLEMS (pain or burning when urinating, or frequent urination) ?*BOWEL PROBLEMS (unusual diarrhea, constipation, pain near the anus) ?TENDERNESS IN MOUTH AND THROAT WITH OR WITHOUT PRESENCE OF ULCERS (sore throat, sores in mouth, or a toothache) ?UNUSUAL RASH, SWELLING OR PAIN  ?UNUSUAL VAGINAL DISCHARGE OR ITCHING  ? ?Items with * indicate a potential emergency and should be followed up as soon as possible or go to the Emergency Department if any problems should occur. ? ?Please show the CHEMOTHERAPY ALERT CARD or IMMUNOTHERAPY ALERT CARD at check-in  to the Emergency Department and triage nurse. ? ?Should you have questions after your visit or need to cancel or reschedule your appointment, please contact [facility]  Dept: [phone]  and follow the prompts.  Office hours are 8:00 a.m. to 4:30 p.m. Monday - Friday. Please note that voicemails left after 4:00 p.m. may not be returned until the following business day.  We are closed weekends and major holidays. You have access to a nurse at all times for urgent questions. Please call the main number to the clinic Dept: [phone] and follow the prompts. ? ? ?For any non-urgent questions, you may also contact your provider using MyChart. We now offer e-Visits for anyone 26 and older to request care online for non-urgent symptoms. For details visit [email or web address]. ?  ?Also download the MyChart app! Go to the app store, search "MyChart", open the app, select [facility], and log in with your MyChart username and password. ? ?Due to Covid, a mask is required upon entering the hospital/clinic. If you do not have a mask, one will be given to you upon arrival. For doctor visits, patients may have 1 support person aged 58 or older with them. For treatment visits, patients cannot have anyone with them due to current Covid guidelines and our immunocompromised population.  ? ?

## 2021-11-09 ENCOUNTER — Ambulatory Visit: Payer: No Typology Code available for payment source

## 2021-11-09 NOTE — Progress Notes (Signed)
? ?Patient Care Team: ?Seward Carol, MD as PCP - General (Internal Medicine) ?Nicholas Lose, MD as Consulting Physician (Hematology and Oncology) ?[name], MD as Consulting Physician (General Surgery) ?Kyung Rudd, MD as Consulting Physician (Radiation Oncology) ?Law, Bonnita Hollow, DO as Consulting Physician (Obstetrics and Gynecology) ? ?DIAGNOSIS:  ?Encounter Diagnosis  ?Name Primary?  ? Malignant neoplasm of upper-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, estrogen receptor positive ([facility])   ? ? ?SUMMARY OF ONCOLOGIC HISTORY: ?Oncology History  ?Malignant neoplasm of upper-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, estrogen receptor positive ([facility])  ?09/08/2020 Cancer Staging  ? Staging form: Breast, AJCC 8th Edition ?- Clinical stage from 09/08/2020: Stage IA (cT1b, cN0, cM0, G2, ER+, PR+, HER2+) - Signed by Gardenia Phlegm, NP on 09/20/2020 ?Stage prefix: Initial diagnosis ? ?  ?09/08/2020 Initial Diagnosis  ? Mammogram detected 0.7 cm mass in the right breast 1 o'clock position, right breast biopsy 1: Grade 2 IDC ER 95% PR 95%, Ki-67 30%, HER-2 positive ratio 2.83 ?  ?10/16/2020 Surgery  ? Right lumpectomy [name]): IDC, 1.0cm, grade 3, with high grade DCIS, clear margins, 4 right axillary lymph nodes negative for carcinoma. ?  ?10/16/2020 Cancer Staging  ? Staging form: Breast, AJCC 8th Edition ?- Pathologic stage from 10/16/2020: Stage IA (pT1b, pN0, cM0, G3, ER+, PR+, HER2+) - Signed by Gardenia Phlegm, NP on 11/01/2020 ?Histologic grading system: 3 grade system ? ?  ?11/13/2020 -  Chemotherapy  ? Patient is on Treatment Plan : BREAST Paclitaxel + Trastuzumab q7d / Trastuzumab q21d  ? ?  ?  ?11/23/2021 Miscellaneous  ? Genetics (done at [facility]):  PALBS and MUTYH pathogenic mutations (additional VUS) ?  ? ? ?CHIEF COMPLIANT: Cycle 12 Herceptin ? ?INTERVAL HISTORY: Grace Pennington is a ?51 y.o. with above-mentioned history of right breast cancer who underwent a right lumpectomy and is  currently on Herceptin. She presents to the clinic today for treatment. She states she is tolerating the letrozole.  She has an appointment to get the port removed.  She had genetic testing which showed PALB2 and MUTYH mutation. ? ?ALLERGIES:  has No Known Allergies. ? ?MEDICATIONS:  ?Current Outpatient Medications  ?Medication Sig Dispense Refill  ? ALPRAZolam (XANAX) 0.25 MG tablet Take 1 tablet by mouth once a day if needed 30 tablet 3  ? carvedilol (COREG) 3.125 MG tablet Take 1 tablet (3.125 mg total) by mouth 2 (two) times daily. 60 tablet 3  ? citalopram (CELEXA) 10 MG tablet TAKE 1 TABLET BY MOUTH ONCE DAILY 90 tablet 3  ? hydrochlorothiazide (HYDRODIURIL) 25 MG tablet TAKE 1 TABLET BY MOUTH ONCE DAILY 90 tablet 3  ? influenza vac split quadrivalent PF (FLUARIX) 0.5 ML injection Inject 0.5 mLs into the muscle. 0.5 mL 0  ? letrozole (FEMARA) 2.5 MG tablet Take 1 tablet by mouth daily. 90 tablet 3  ? losartan (COZAAR) 50 MG tablet Take 1 tablet (50 mg total) by mouth daily. 90 tablet 2  ? metFORMIN (GLUCOPHAGE) 1000 MG tablet Take 1,000 mg by mouth 2 (two) times daily with a meal.    ? Multiple Vitamins-Minerals (MULTIVITAMIN WITH MINERALS) tablet Take 1 tablet by mouth daily.    ? pantoprazole (PROTONIX) 40 MG tablet TAKE 1 TABLET BY MOUTH DAILY.  Need follow up with MD for more refills. 90 tablet 1  ? rosuvastatin (CRESTOR) 10 MG tablet Take 1 tablet by mouth once a day 90 tablet 3  ? spironolactone (ALDACTONE) 50 MG tablet Take 1  tablet by mouth twice a day 60 tablet 0  ? WEGOVY 0.25 MG/0.5ML SOAJ Inject 0.25 mg into the skin once a week. 2 mL 0  ? ?No current facility-administered medications for this visit.  ? ? ?PHYSICAL EXAMINATION: ?ECOG PERFORMANCE STATUS: 1 - Symptomatic but completely ambulatory ? ?Vitals:  ? 11/23/21 0837  ?BP: 97/72  ?Pulse: 76  ?Resp: 16  ?Temp: (!) 97.5 ?F (36.4 ?C)  ?SpO2: 98%  ? ?Filed Weights  ? 11/23/21 0837  ?Weight: 255 lb 11.2 oz (116 kg)  ? ?  ? ?LABORATORY DATA:  ?I  have reviewed the data as listed ? ?  Latest Ref Rng & Units 10/12/2021  ?  8:32 AM 08/31/2021  ?  8:39 AM 08/10/2021  ?  8:31 AM  ?CMP  ?Glucose 70 - 99 mg/dL 132   97   102    ?BUN 6 - 20 mg/dL 17   19   18     ?Creatinine 0.44 - 1.00 mg/dL 1.09   1.01   0.89    ?Sodium 135 - 145 mmol/L 141   143   140    ?Potassium 3.5 - 5.1 mmol/L 3.7   3.9   3.5    ?Chloride 98 - 111 mmol/L 104   107   103    ?CO2 22 - 32 mmol/L 29   29   30     ?Calcium 8.9 - 10.3 mg/dL 10.1   9.6   9.4    ?Total Protein 6.5 - 8.1 g/dL 8.0   7.6   7.7    ?Total Bilirubin 0.3 - 1.2 mg/dL 0.4   0.3   0.4    ?Alkaline Phos 38 - 126 U/L 60   60   67    ?AST 15 - 41 U/L 20   24   17     ?ALT 0 - 44 U/L 21   25   21     ? ? ?Lab Results  ?Component Value Date  ? WBC 5.2 10/12/2021  ? HGB 13.3 10/12/2021  ? HCT 39.7 10/12/2021  ? MCV 84.5 10/12/2021  ? PLT 255 10/12/2021  ? NEUTROABS 2.9 10/12/2021  ? ? ?ASSESSMENT & PLAN:  ?Malignant neoplasm of upper-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, estrogen receptor positive ([place]) ?09/08/2020:Mammogram detected 0.7 cm mass in the right breast 1 o'clock position, right breast biopsy 1: Grade 2 IDC ER 95% PR 95%, Ki-67 30%, HER-2 positive ratio 2.83 ?T1b N0 stage Ia ?  ?10/16/20: Grade 3 IDC with DCIS 0/4 LN  ER 95% PR 95%, Ki-67 30%, HER-2 positive ratio 2.83 ?  ?Treatment Plan: ?1. adjuvant Taxol Herceptin followed by Herceptin maintenance for 1 year plan to start in 3 weeks completed 02/02/2021 ?2.  Adjuvant radiation completed 04/04/2021 ?3.  Followed by adjuvant antiestrogen therapy with letrozole started 07/20/2021 (patient went to menopause at the age of 72) ?-------------------------------------------------------------------------------------------------------------------------- ?Current treatment: Herceptin maintenance to be completed 12/14/2021 ?Toxicities: None ?Echocardiogram 05/17/2021: EF 55% ? ?Letrozole toxicities:  ?Denies any adverse effects ?Body aches and pains: I instructed her to take  turmeric. ?Planned treatment duration is 7 years. ?  ?Bone density: 08/30/2021: Osteoporosis T score -2.5: We will add Reclast infusion to her next Herceptin treatment.  She will receive this once a year.  She takes calcium and vitamin D. ?  ?Mammograms: January 2023: Benign ?Return to clinic every 3 weeks for last Herceptin ?PALB2 and MUTYH mutations: I recommended obtaining annual breast MRIs in July.  She is  planning to get oophorectomies done.  She will also need to be screened for colon cancer. ? ?Return to clinic in 6 months for follow-up. ? ? ? ?Orders Placed This Encounter  ?Procedures  ? MR BREAST BILATERAL W WO CONTRAST INC CAD  ?  Standing Status:   Future  ?  Standing Expiration Date:   11/24/2022  ?  Order Specific Question:   If indicated for the ordered procedure, I authorize the administration of contrast media per Radiology protocol  ?  Answer:   Yes  ?  Order Specific Question:   What is the patient's sedation requirement?  ?  Answer:   No Sedation  ?  Order Specific Question:   Does the patient have a pacemaker or implanted devices?  ?  Answer:   No  ?  Order Specific Question:   Preferred imaging location?  ?  Answer:   [facility] (table limit-550lbs)  ?  Order Specific Question:   Release to patient  ?  Answer:   Immediate  ? ?The patient has a good understanding of the overall plan. she agrees with it. she will call with any problems that may develop before the next visit here. ?Total time spent: 30 mins including face to face time and time spent for planning, charting and co-ordination of care ? ? Harriette Ohara, MD ?11/23/21 ? ?I Gardiner Coins am scribing for Dr. Lindi Adie ? ?I have reviewed the above documentation for accuracy and completeness, and I agree with the above. ?  ?  ?

## 2021-11-19 ENCOUNTER — Other Ambulatory Visit (HOSPITAL_COMMUNITY): Payer: Self-pay

## 2021-11-22 NOTE — Assessment & Plan Note (Addendum)
09/08/2020:Mammogram detected 0.7 cm mass in the right breast 1 o'clock position, right breast biopsy 1: Grade 2 IDC ER 95% PR 95%, Ki-67 30%, HER-2 positive ratio 2.83 ?T1b N0 stage Ia ?? ?10/16/20: Grade 3 IDC with DCIS 0/4 LN??ER 95% PR 95%, Ki-67 30%, HER-2 positive ratio 2.83 ?? ?Treatment Plan: ?1.?adjuvant Taxol Herceptin followed by Herceptin maintenance for 1 year?plan to start in 3 weeks?completed 02/02/2021 ?2.??Adjuvant radiation?completed 04/04/2021 ?3.??Followed by adjuvant antiestrogen therapy?with letrozole started 07/20/2021 (patient went to menopause at the age of 76) ?-------------------------------------------------------------------------------------------------------------------------- ?Current treatment: Herceptin maintenance?to be completed 12/14/2021 ?Toxicities: None ?Echocardiogram 05/17/2021: EF 55% ? ?Letrozole?toxicities:? ?Denies any adverse effects ?Body aches and pains: I instructed her to take turmeric. ?Planned treatment duration is 7?years. ?? ?Bone density: 08/30/2021: Osteoporosis T score -2.5: We will add Reclast infusion to her next Herceptin treatment. ?She will receive this once a year. ?She takes calcium and vitamin D. ?? ?Mammograms:?January 2023: Benign ?Return to clinic every 3 weeks for last Herceptin ?PALB2 and MUTYH mutations: I recommended obtaining annual breast MRIs in July.  She is planning to get oophorectomies done.  She will also need to be screened for colon cancer. ? ?Return to clinic in 6 months for follow-up. ?

## 2021-11-23 ENCOUNTER — Inpatient Hospital Stay: Payer: No Typology Code available for payment source

## 2021-11-23 ENCOUNTER — Other Ambulatory Visit: Payer: Self-pay

## 2021-11-23 ENCOUNTER — Inpatient Hospital Stay (HOSPITAL_BASED_OUTPATIENT_CLINIC_OR_DEPARTMENT_OTHER): Payer: No Typology Code available for payment source | Admitting: Hematology and Oncology

## 2021-11-23 VITALS — BP 111/77 | HR 67

## 2021-11-23 DIAGNOSIS — Z170 Estrogen receptor positive status [ER+]: Secondary | ICD-10-CM

## 2021-11-23 DIAGNOSIS — C50211 Malignant neoplasm of upper-inner quadrant of right female breast: Secondary | ICD-10-CM

## 2021-11-23 DIAGNOSIS — Z5112 Encounter for antineoplastic immunotherapy: Secondary | ICD-10-CM | POA: Diagnosis not present

## 2021-11-23 DIAGNOSIS — Z95828 Presence of other vascular implants and grafts: Secondary | ICD-10-CM

## 2021-11-23 LAB — BASIC METABOLIC PANEL - CANCER CENTER ONLY
Anion gap: 5 (ref 5–15)
BUN: 13 mg/dL (ref 6–20)
CO2: 30 mmol/L (ref 22–32)
Calcium: 9.2 mg/dL (ref 8.9–10.3)
Chloride: 104 mmol/L (ref 98–111)
Creatinine: 1.06 mg/dL — ABNORMAL HIGH (ref 0.44–1.00)
GFR, Estimated: >60 mL/min (ref >60)
Glucose, Bld: 103 mg/dL — ABNORMAL HIGH (ref 70–99)
Potassium: 3.8 mmol/L (ref 3.5–5.1)
Sodium: 139 mmol/L (ref 135–145)

## 2021-11-23 MED ORDER — HEPARIN SOD (PORK) LOCK FLUSH 100 UNIT/ML IV SOLN
500.0000 [IU] | Freq: Once | INTRAVENOUS | Status: AC | PRN
  Administered 2021-11-23: 500 [IU]

## 2021-11-23 MED ORDER — SODIUM CHLORIDE 0.9% FLUSH
10.0000 mL | Freq: Once | INTRAVENOUS | Status: AC
  Administered 2021-11-23: 10 mL

## 2021-11-23 MED ORDER — SODIUM CHLORIDE 0.9 % IV SOLN: Freq: Once | INTRAVENOUS | Status: AC

## 2021-11-23 MED ORDER — ACETAMINOPHEN 325 MG PO TABS
650.0000 mg | ORAL_TABLET | Freq: Once | ORAL | Status: AC
  Administered 2021-11-23: 650 mg via ORAL
  Filled 2021-11-23: qty 2

## 2021-11-23 MED ORDER — TRASTUZUMAB-DKST CHEMO 150 MG IV SOLR
6.0000 mg/kg | Freq: Once | INTRAVENOUS | Status: AC
  Administered 2021-11-23: 693 mg via INTRAVENOUS
  Filled 2021-11-23: qty 33

## 2021-11-23 MED ORDER — SODIUM CHLORIDE 0.9% FLUSH
10.0000 mL | INTRAVENOUS | Status: DC | PRN
  Administered 2021-11-23: 10 mL

## 2021-11-23 MED ORDER — DIPHENHYDRAMINE HCL 25 MG PO CAPS
50.0000 mg | ORAL_CAPSULE | Freq: Once | ORAL | Status: AC
  Administered 2021-11-23: 50 mg via ORAL
  Filled 2021-11-23: qty 2

## 2021-11-23 NOTE — Patient Instructions (Signed)
[facility]  Discharge Instructions: Thank you for choosing [facility] to provide your oncology and hematology care.   If you have a lab appointment with the Cancer Center, please go directly to the Cancer Center and check in at the registration area.   Wear comfortable clothing and clothing appropriate for easy access to any Portacath or PICC line.   We strive to give you quality time with your provider. You may need to reschedule your appointment if you arrive late (15 or more minutes).  Arriving late affects you and other patients whose appointments are after yours.  Also, if you miss three or more appointments without notifying the office, you may be dismissed from the clinic at the provider's discretion.      For prescription refill requests, have your pharmacy contact our office and allow 72 hours for refills to be completed.    Today you received the following chemotherapy and/or immunotherapy agents herceptin      To help prevent nausea and vomiting after your treatment, we encourage you to take your nausea medication as directed.  BELOW ARE SYMPTOMS THAT SHOULD BE REPORTED IMMEDIATELY: *FEVER GREATER THAN 100.4 F (38 C) OR HIGHER *CHILLS OR SWEATING *NAUSEA AND VOMITING THAT IS NOT CONTROLLED WITH YOUR NAUSEA MEDICATION *UNUSUAL SHORTNESS OF BREATH *UNUSUAL BRUISING OR BLEEDING *URINARY PROBLEMS (pain or burning when urinating, or frequent urination) *BOWEL PROBLEMS (unusual diarrhea, constipation, pain near the anus) TENDERNESS IN MOUTH AND THROAT WITH OR WITHOUT PRESENCE OF ULCERS (sore throat, sores in mouth, or a toothache) UNUSUAL RASH, SWELLING OR PAIN  UNUSUAL VAGINAL DISCHARGE OR ITCHING   Items with * indicate a potential emergency and should be followed up as soon as possible or go to the Emergency Department if any problems should occur.  Please show the CHEMOTHERAPY ALERT CARD or IMMUNOTHERAPY ALERT CARD at check-in to  the Emergency Department and triage nurse.  Should you have questions after your visit or need to cancel or reschedule your appointment, please contact [facility]  Dept: [phone]  and follow the prompts.  Office hours are 8:00 a.m. to 4:30 p.m. Monday - Friday. Please note that voicemails left after 4:00 p.m. may not be returned until the following business day.  We are closed weekends and major holidays. You have access to a nurse at all times for urgent questions. Please call the main number to the clinic Dept: [phone] and follow the prompts.   For any non-urgent questions, you may also contact your provider using MyChart. We now offer e-Visits for anyone 18 and older to request care online for non-urgent symptoms. For details visit [email or web address].   Also download the MyChart app! Go to the app store, search "MyChart", open the app, select [facility], and log in with your MyChart username and password.  Due to Covid, a mask is required upon entering the hospital/clinic. If you do not have a mask, one will be given to you upon arrival. For doctor visits, patients may have 1 support person aged 18 or older with them. For treatment visits, patients cannot have anyone with them due to current Covid guidelines and our immunocompromised population.   

## 2021-11-26 ENCOUNTER — Telehealth: Payer: Self-pay | Admitting: Hematology and Oncology

## 2021-11-26 NOTE — Telephone Encounter (Signed)
Per 4/28 los called and spoke to pt about appointment in 6 months.  Pt confirmed appointment  ?

## 2021-12-03 ENCOUNTER — Ambulatory Visit: Payer: No Typology Code available for payment source | Attending: General Surgery

## 2021-12-03 VITALS — Wt 251.5 lb

## 2021-12-03 DIAGNOSIS — Z483 Aftercare following surgery for neoplasm: Secondary | ICD-10-CM | POA: Insufficient documentation

## 2021-12-03 NOTE — Therapy (Signed)
?  OUTPATIENT PHYSICAL THERAPY SOZO SCREENING NOTE ? ? ?Patient Name: Grace Pennington ?MRN: [id number] ?DOB:1970-10-10, 51 y.o., female ?Today's Date: 12/03/2021 ? ?PCP: Seward Carol, MD ?REFERRING PROVIDER: [name], MD ? ? PT End of Session - 12/03/21 0804   ? ? Visit Number 7   # unchanged due to screen only  ? PT Start Time 0802   ? PT Stop Time 6283   ? PT Time Calculation (min) 5 min   ? Activity Tolerance Patient tolerated treatment well   ? Behavior During Therapy [facility] for tasks assessed/performed   ? ?  ?  ? ?  ? ? ?Past Medical History:  ?Diagnosis Date  ? Anxiety   ? Breast cancer in female [facility])   ? Right  ? Depression   ? Diabetes mellitus without complication ([place])   ? GERD (gastroesophageal reflux disease)   ? Hypertension   ? Palpitations   ? Sleep apnea   ? ?Past Surgical History:  ?Procedure Laterality Date  ? BREAST LUMPECTOMY WITH RADIOACTIVE SEED AND SENTINEL LYMPH NODE BIOPSY Right 10/16/2020  ? Procedure: RIGHT BREAST LUMPECTOMY WITH RADIOACTIVE SEED AND SENTINEL LYMPH NODE BIOPSY;  Surgeon: [name], MD;  Location: [place];  Service: General;  Laterality: Right;  ? PORTACATH PLACEMENT Left 10/16/2020  ? Procedure: INSERTION PORT-A-CATH;  Surgeon: [name], MD;  Location: [place];  Service: General;  Laterality: Left;  ? TUBAL LIGATION    ? ?Patient Active Problem List  ? Diagnosis Date Noted  ? Port-A-Cath in place 11/20/2020  ? Malignant neoplasm of upper-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, estrogen receptor positive ([place]) 09/20/2020  ? Precordial pain 04/14/2014  ? Essential hypertension, benign 08/03/2013  ? Family history of ischemic heart disease 08/03/2013  ? ? ?REFERRING DIAG: right breast cancer at risk for lymphedema ? ?THERAPY DIAG:  ?Aftercare following surgery for neoplasm ? ?PERTINENT HISTORY: Rt lumpectomy and SLNB on 10/16/20 with Dr. [name] 0/4 nodes positive due to ER+ breast cancer with chemotherapy and radiation, completed radiation on 9/7,  HTN  ? ?PRECAUTIONS:  right UE Lymphedema risk, None ? ?SUBJECTIVE: Pt returns for her 3 month L-Dex screen.  ? ?PAIN:  ?Are you having pain? No ? ?SOZO SCREENING: ?Patient was assessed today using the SOZO machine to determine the lymphedema index score. This was compared to her baseline score. It was determined that she is within the recommended range when compared to her baseline and no further action is needed at this time. She will continue SOZO screenings. These are done every 3 months for 2 years post operatively followed by every 6 months for 2 years, and then annually. ? ? ? ?[name], PTA ?12/03/2021, 8:10 AM ? ?  ? ?

## 2021-12-05 ENCOUNTER — Ambulatory Visit: Payer: Self-pay | Admitting: General Surgery

## 2021-12-07 ENCOUNTER — Other Ambulatory Visit (HOSPITAL_COMMUNITY): Payer: Self-pay

## 2021-12-07 MED ORDER — SPIRONOLACTONE 50 MG PO TABS
50.0000 mg | ORAL_TABLET | Freq: Two times a day (BID) | ORAL | 11 refills | Status: DC
  Filled 2021-12-07: qty 60, 30d supply, fill #0
  Filled 2022-03-21: qty 60, 30d supply, fill #1

## 2021-12-12 ENCOUNTER — Other Ambulatory Visit (HOSPITAL_COMMUNITY): Payer: Self-pay

## 2021-12-12 ENCOUNTER — Other Ambulatory Visit (HOSPITAL_COMMUNITY): Payer: Self-pay | Admitting: Internal Medicine

## 2021-12-12 ENCOUNTER — Encounter (HOSPITAL_COMMUNITY): Payer: Self-pay

## 2021-12-13 ENCOUNTER — Other Ambulatory Visit (HOSPITAL_COMMUNITY): Payer: Self-pay

## 2021-12-13 ENCOUNTER — Encounter: Payer: Self-pay | Admitting: *Deleted

## 2021-12-13 DIAGNOSIS — Z170 Estrogen receptor positive status [ER+]: Secondary | ICD-10-CM

## 2021-12-13 MED ORDER — CARVEDILOL 3.125 MG PO TABS
3.1250 mg | ORAL_TABLET | Freq: Two times a day (BID) | ORAL | 3 refills | Status: DC
Start: 2021-12-13 — End: 2022-06-11
  Filled 2021-12-13: qty 60, 30d supply, fill #0
  Filled 2022-02-07: qty 180, 90d supply, fill #1

## 2021-12-14 ENCOUNTER — Inpatient Hospital Stay: Payer: No Typology Code available for payment source | Attending: Hematology and Oncology

## 2021-12-14 ENCOUNTER — Other Ambulatory Visit: Payer: Self-pay

## 2021-12-14 ENCOUNTER — Encounter: Payer: Self-pay | Admitting: *Deleted

## 2021-12-14 ENCOUNTER — Other Ambulatory Visit (HOSPITAL_COMMUNITY): Payer: Self-pay

## 2021-12-14 ENCOUNTER — Other Ambulatory Visit: Payer: No Typology Code available for payment source

## 2021-12-14 VITALS — BP 97/75 | HR 77 | Temp 98.0°F | Resp 17 | Wt 254.8 lb

## 2021-12-14 DIAGNOSIS — Z170 Estrogen receptor positive status [ER+]: Secondary | ICD-10-CM | POA: Diagnosis not present

## 2021-12-14 DIAGNOSIS — Z5112 Encounter for antineoplastic immunotherapy: Secondary | ICD-10-CM | POA: Diagnosis present

## 2021-12-14 DIAGNOSIS — C50211 Malignant neoplasm of upper-inner quadrant of right female breast: Secondary | ICD-10-CM | POA: Diagnosis present

## 2021-12-14 MED ORDER — SODIUM CHLORIDE 0.9 % IV SOLN: Freq: Once | INTRAVENOUS | Status: AC

## 2021-12-14 MED ORDER — HEPARIN SOD (PORK) LOCK FLUSH 100 UNIT/ML IV SOLN
500.0000 [IU] | Freq: Once | INTRAVENOUS | Status: AC | PRN
  Administered 2021-12-14: 500 [IU]

## 2021-12-14 MED ORDER — SODIUM CHLORIDE 0.9% FLUSH
10.0000 mL | INTRAVENOUS | Status: DC | PRN
  Administered 2021-12-14: 10 mL

## 2021-12-14 MED ORDER — DIPHENHYDRAMINE HCL 25 MG PO CAPS
50.0000 mg | ORAL_CAPSULE | Freq: Once | ORAL | Status: AC
  Administered 2021-12-14: 50 mg via ORAL
  Filled 2021-12-14: qty 2

## 2021-12-14 MED ORDER — TRASTUZUMAB-DKST CHEMO 150 MG IV SOLR
6.0000 mg/kg | Freq: Once | INTRAVENOUS | Status: AC
  Administered 2021-12-14: 693 mg via INTRAVENOUS
  Filled 2021-12-14: qty 33

## 2021-12-14 MED ORDER — ACETAMINOPHEN 325 MG PO TABS
650.0000 mg | ORAL_TABLET | Freq: Once | ORAL | Status: AC
  Administered 2021-12-14: 650 mg via ORAL
  Filled 2021-12-14: qty 2

## 2021-12-18 ENCOUNTER — Other Ambulatory Visit: Payer: Self-pay

## 2021-12-18 ENCOUNTER — Encounter (HOSPITAL_BASED_OUTPATIENT_CLINIC_OR_DEPARTMENT_OTHER): Payer: Self-pay | Admitting: General Surgery

## 2021-12-18 NOTE — Progress Notes (Signed)
Reviewed chart with Dr. Sabra Heck at [facility]. Okay to proceed with surgery as scheduled.

## 2021-12-21 ENCOUNTER — Other Ambulatory Visit (HOSPITAL_COMMUNITY): Payer: Self-pay

## 2021-12-21 MED ORDER — PANTOPRAZOLE SODIUM 40 MG PO TBEC
40.0000 mg | DELAYED_RELEASE_TABLET | Freq: Every day | ORAL | 1 refills | Status: DC
  Filled 2021-12-21: qty 30, 30d supply, fill #0

## 2021-12-26 ENCOUNTER — Encounter (HOSPITAL_BASED_OUTPATIENT_CLINIC_OR_DEPARTMENT_OTHER)
Admission: RE | Admit: 2021-12-26 | Discharge: 2021-12-26 | Disposition: A | Discharge status: Home / Self Care | Payer: No Typology Code available for payment source | Source: Ambulatory Visit | Attending: General Surgery | Admitting: General Surgery

## 2021-12-26 DIAGNOSIS — Z01818 Encounter for other preprocedural examination: Secondary | ICD-10-CM | POA: Diagnosis present

## 2021-12-26 DIAGNOSIS — I10 Essential (primary) hypertension: Secondary | ICD-10-CM | POA: Diagnosis not present

## 2021-12-26 LAB — BASIC METABOLIC PANEL
Anion gap: 6 (ref 5–15)
BUN: 14 mg/dL (ref 6–20)
CO2: 28 mmol/L (ref 22–32)
Calcium: 9.2 mg/dL (ref 8.9–10.3)
Chloride: 106 mmol/L (ref 98–111)
Creatinine, Ser: 0.94 mg/dL (ref 0.44–1.00)
GFR, Estimated: >60 mL/min (ref >60)
Glucose, Bld: 129 mg/dL — ABNORMAL HIGH (ref 70–99)
Potassium: 3.9 mmol/L (ref 3.5–5.1)
Sodium: 140 mmol/L (ref 135–145)

## 2021-12-27 ENCOUNTER — Ambulatory Visit: Payer: Self-pay | Admitting: General Surgery

## 2021-12-28 ENCOUNTER — Encounter (HOSPITAL_BASED_OUTPATIENT_CLINIC_OR_DEPARTMENT_OTHER): Payer: Self-pay | Admitting: General Surgery

## 2021-12-28 ENCOUNTER — Ambulatory Visit (HOSPITAL_BASED_OUTPATIENT_CLINIC_OR_DEPARTMENT_OTHER): Payer: No Typology Code available for payment source | Admitting: Certified Registered"

## 2021-12-28 ENCOUNTER — Encounter (HOSPITAL_BASED_OUTPATIENT_CLINIC_OR_DEPARTMENT_OTHER)
Admission: RE | Disposition: A | Discharge status: Home / Self Care | Payer: Self-pay | Source: Home / Self Care | Attending: General Surgery

## 2021-12-28 ENCOUNTER — Other Ambulatory Visit (HOSPITAL_COMMUNITY): Payer: Self-pay

## 2021-12-28 ENCOUNTER — Ambulatory Visit (HOSPITAL_BASED_OUTPATIENT_CLINIC_OR_DEPARTMENT_OTHER)
Admission: RE | Admit: 2021-12-28 | Discharge: 2021-12-28 | Disposition: A | Discharge status: Home / Self Care | Payer: No Typology Code available for payment source | Attending: General Surgery | Admitting: General Surgery

## 2021-12-28 ENCOUNTER — Other Ambulatory Visit: Payer: Self-pay

## 2021-12-28 DIAGNOSIS — E119 Type 2 diabetes mellitus without complications: Secondary | ICD-10-CM | POA: Insufficient documentation

## 2021-12-28 DIAGNOSIS — G4730 Sleep apnea, unspecified: Secondary | ICD-10-CM | POA: Insufficient documentation

## 2021-12-28 DIAGNOSIS — Z853 Personal history of malignant neoplasm of breast: Secondary | ICD-10-CM | POA: Diagnosis not present

## 2021-12-28 DIAGNOSIS — I10 Essential (primary) hypertension: Secondary | ICD-10-CM | POA: Diagnosis not present

## 2021-12-28 DIAGNOSIS — C50911 Malignant neoplasm of unspecified site of right female breast: Secondary | ICD-10-CM | POA: Diagnosis not present

## 2021-12-28 DIAGNOSIS — K219 Gastro-esophageal reflux disease without esophagitis: Secondary | ICD-10-CM | POA: Diagnosis not present

## 2021-12-28 DIAGNOSIS — Z452 Encounter for adjustment and management of vascular access device: Secondary | ICD-10-CM | POA: Diagnosis present

## 2021-12-28 DIAGNOSIS — Z87891 Personal history of nicotine dependence: Secondary | ICD-10-CM | POA: Insufficient documentation

## 2021-12-28 DIAGNOSIS — Z6841 Body Mass Index (BMI) 40.0 and over, adult: Secondary | ICD-10-CM | POA: Diagnosis not present

## 2021-12-28 DIAGNOSIS — F32A Depression, unspecified: Secondary | ICD-10-CM | POA: Insufficient documentation

## 2021-12-28 DIAGNOSIS — Z7984 Long term (current) use of oral hypoglycemic drugs: Secondary | ICD-10-CM

## 2021-12-28 DIAGNOSIS — F419 Anxiety disorder, unspecified: Secondary | ICD-10-CM | POA: Insufficient documentation

## 2021-12-28 HISTORY — PX: PORT-A-CATH REMOVAL: SHX5289

## 2021-12-28 LAB — GLUCOSE, CAPILLARY
Glucose-Capillary: 89 mg/dL (ref 70–99)
Glucose-Capillary: 88 mg/dL (ref 70–99)

## 2021-12-28 SURGERY — REMOVAL PORT-A-CATH
Anesthesia: Monitor Anesthesia Care | Site: Chest

## 2021-12-28 MED ORDER — ACETAMINOPHEN 500 MG PO TABS
1000.0000 mg | ORAL_TABLET | Freq: Once | ORAL | Status: AC
  Administered 2021-12-28: 1000 mg via ORAL

## 2021-12-28 MED ORDER — OXYCODONE HCL 5 MG PO TABS
5.0000 mg | ORAL_TABLET | Freq: Four times a day (QID) | ORAL | 0 refills | Status: DC | PRN
  Filled 2021-12-28: qty 5, 2d supply, fill #0

## 2021-12-28 MED ORDER — FENTANYL CITRATE (PF) 100 MCG/2ML IJ SOLN
INTRAMUSCULAR | Status: DC | PRN
Start: 2021-12-28 — End: 2021-12-28
  Administered 2021-12-28: 100 ug via INTRAVENOUS

## 2021-12-28 MED ORDER — FENTANYL CITRATE (PF) 100 MCG/2ML IJ SOLN: 25.0000 ug | INTRAMUSCULAR | Status: DC | PRN

## 2021-12-28 MED ORDER — PROPOFOL 10 MG/ML IV BOLUS
INTRAVENOUS | Status: DC | PRN
  Administered 2021-12-28: 40 mg via INTRAVENOUS

## 2021-12-28 MED ORDER — CHLORHEXIDINE GLUCONATE CLOTH 2 % EX PADS: 6.0000 | MEDICATED_PAD | Freq: Once | CUTANEOUS | Status: DC

## 2021-12-28 MED ORDER — LACTATED RINGERS IV SOLN: INTRAVENOUS | Status: DC

## 2021-12-28 MED ORDER — LIDOCAINE-EPINEPHRINE (PF) 1 %-1:200000 IJ SOLN
INTRAMUSCULAR | Status: DC | PRN
  Administered 2021-12-28: 10 mL

## 2021-12-28 MED ORDER — FENTANYL CITRATE (PF) 100 MCG/2ML IJ SOLN
INTRAMUSCULAR | Status: AC
  Filled 2021-12-28: qty 2

## 2021-12-28 MED ORDER — PROPOFOL 500 MG/50ML IV EMUL
INTRAVENOUS | Status: DC | PRN
  Administered 2021-12-28: 100 ug/kg/min via INTRAVENOUS

## 2021-12-28 MED ORDER — ACETAMINOPHEN 500 MG PO TABS
ORAL_TABLET | ORAL | Status: AC
  Filled 2021-12-28: qty 2

## 2021-12-28 MED ORDER — MIDAZOLAM HCL 5 MG/5ML IJ SOLN
INTRAMUSCULAR | Status: DC | PRN
  Administered 2021-12-28: 2 mg via INTRAVENOUS

## 2021-12-28 MED ORDER — LIDOCAINE 2% (20 MG/ML) 5 ML SYRINGE
INTRAMUSCULAR | Status: DC | PRN
  Administered 2021-12-28: 40 mg via INTRAVENOUS

## 2021-12-28 MED ORDER — LIDOCAINE-EPINEPHRINE (PF) 1 %-1:200000 IJ SOLN
INTRAMUSCULAR | Status: AC
  Filled 2021-12-28: qty 30

## 2021-12-28 MED ORDER — ONDANSETRON HCL 4 MG/2ML IJ SOLN
INTRAMUSCULAR | Status: DC | PRN
  Administered 2021-12-28: 4 mg via INTRAVENOUS

## 2021-12-28 MED ORDER — MIDAZOLAM HCL 2 MG/2ML IJ SOLN
INTRAMUSCULAR | Status: AC
  Filled 2021-12-28: qty 2

## 2021-12-28 SURGICAL SUPPLY — 28 items
ADH SKN CLS APL DERMABOND .7 (GAUZE/BANDAGES/DRESSINGS) ×1
APL PRP STRL LF DISP 70% ISPRP (MISCELLANEOUS) ×1
BLADE SURG 15 STRL LF DISP TIS (BLADE) ×1 IMPLANT
BLADE SURG 15 STRL SS (BLADE) ×2
CHLORAPREP W/TINT 26 (MISCELLANEOUS) ×2 IMPLANT
COVER BACK TABLE 60X90IN (DRAPES) ×2 IMPLANT
COVER MAYO STAND STRL (DRAPES) ×2 IMPLANT
DERMABOND ADVANCED (GAUZE/BANDAGES/DRESSINGS) ×1
DERMABOND ADVANCED .7 DNX12 (GAUZE/BANDAGES/DRESSINGS) ×1 IMPLANT
DRAPE LAPAROTOMY 100X72 PEDS (DRAPES) ×2 IMPLANT
DRAPE UTILITY XL STRL (DRAPES) ×2 IMPLANT
ELECT COATED BLADE 2.86 ST (ELECTRODE) ×1 IMPLANT
ELECT REM PT RETURN 9FT ADLT (ELECTROSURGICAL) ×2
ELECTRODE REM PT RTRN 9FT ADLT (ELECTROSURGICAL) IMPLANT
GLOVE BIO SURGEON STRL SZ7.5 (GLOVE) ×2 IMPLANT
GOWN STRL REUS W/ TWL LRG LVL3 (GOWN DISPOSABLE) ×2 IMPLANT
GOWN STRL REUS W/TWL LRG LVL3 (GOWN DISPOSABLE) ×4
NDL HYPO 25X1 1.5 SAFETY (NEEDLE) ×1 IMPLANT
NEEDLE HYPO 25X1 1.5 SAFETY (NEEDLE) ×2 IMPLANT
PACK BASIN DAY SURGERY FS (CUSTOM PROCEDURE TRAY) ×2 IMPLANT
PENCIL SMOKE EVACUATOR (MISCELLANEOUS) ×1 IMPLANT
SLEEVE SCD COMPRESS KNEE MED (STOCKING) IMPLANT
SPIKE FLUID TRANSFER (MISCELLANEOUS) ×2 IMPLANT
SUT MON AB 4-0 PC3 18 (SUTURE) ×2 IMPLANT
SUT VIC AB 3-0 SH 27 (SUTURE) ×2
SUT VIC AB 3-0 SH 27X BRD (SUTURE) ×1 IMPLANT
SYR CONTROL 10ML LL (SYRINGE) ×2 IMPLANT
TOWEL GREEN STERILE FF (TOWEL DISPOSABLE) ×2 IMPLANT

## 2021-12-28 NOTE — Anesthesia Postprocedure Evaluation (Signed)
Anesthesia Post Note  Patient: Grace Pennington  Procedure(s) Performed: REMOVAL PORT-A-CATH (Chest)     Patient location during evaluation: PACU Anesthesia Type: MAC Level of consciousness: awake and alert Pain management: pain level controlled Vital Signs Assessment: post-procedure vital signs reviewed and stable Respiratory status: spontaneous breathing, nonlabored ventilation and respiratory function stable Cardiovascular status: stable and blood pressure returned to baseline Postop Assessment: no apparent nausea or vomiting Anesthetic complications: no   No notable events documented.  Last Vitals:  Vitals:   12/28/21 1253 12/28/21 1305  BP:  125/84  Pulse: 69 76  Resp: 12 15  Temp:  (!) 36.4 C  SpO2: 100% 97%    Last Pain:  Vitals:   12/28/21 1305  TempSrc: Oral  PainSc: 0-No pain                 Arrington Bencomo,W. EDMOND

## 2021-12-28 NOTE — Transfer of Care (Signed)
Immediate Anesthesia Transfer of Care Note  Patient: Grace Pennington  Procedure(s) Performed: REMOVAL PORT-A-CATH (Chest)  Patient Location: PACU  Anesthesia Type:MAC  Level of Consciousness: awake, alert  and oriented  Airway & Oxygen Therapy: Patient Spontanous Breathing  Post-op Assessment: Report given to RN and Post -op Vital signs reviewed and stable  Post vital signs: Reviewed and stable  Last Vitals:  Vitals Value Taken Time  BP 103/71 12/28/21 1239  Temp 36.3 C 12/28/21 1239  Pulse 74 12/28/21 1242  Resp 8 12/28/21 1242  SpO2 96 % 12/28/21 1242  Vitals shown include unvalidated device data.  Last Pain:  Vitals:   12/28/21 1118  TempSrc: Oral  PainSc: 0-No pain         Complications: No notable events documented.

## 2021-12-28 NOTE — Interval H&P Note (Signed)
History and Physical Interval Note:  12/28/2021 11:59 AM  Grace Pennington  has presented today for surgery, with the diagnosis of RIGHT BREAST CANCER.  The various methods of treatment have been discussed with the patient and family. After consideration of risks, benefits and other options for treatment, the patient has consented to  Procedure(s): REMOVAL PORT-A-CATH (N/A) as a surgical intervention.  The patient's history has been reviewed, patient examined, no change in status, stable for surgery.  I have reviewed the patient's chart and labs.  Questions were answered to the patient's satisfaction.     [name]

## 2021-12-28 NOTE — Discharge Instructions (Signed)

## 2021-12-28 NOTE — Anesthesia Preprocedure Evaluation (Addendum)
Anesthesia Evaluation  Patient identified by MRN, date of birth, ID band Patient awake    Reviewed: Allergy & Precautions, H&P , NPO status , Patient's Chart, lab work & pertinent test results, reviewed documented beta blocker date and time   Airway Mallampati: II  TM Distance: >3 FB Neck ROM: Full    Dental no notable dental hx. (+) Teeth Intact, Dental Advisory Given   Pulmonary sleep apnea and Continuous Positive Airway Pressure Ventilation , former smoker,    Pulmonary exam normal breath sounds clear to auscultation       Cardiovascular hypertension, Pt. on medications and Pt. on home beta blockers  Rhythm:Regular Rate:Normal     Neuro/Psych Anxiety Depression negative neurological ROS     GI/Hepatic Neg liver ROS, GERD  Medicated,  Endo/Other  diabetesMorbid obesity  Renal/GU negative Renal ROS  negative genitourinary   Musculoskeletal   Abdominal   Peds  Hematology negative hematology ROS (+)   Anesthesia Other Findings   Reproductive/Obstetrics negative OB ROS                            Anesthesia Physical Anesthesia Plan  ASA: 3  Anesthesia Plan: MAC   Post-op Pain Management: Tylenol PO (pre-op)* and Minimal or no pain anticipated   Induction: Intravenous  PONV Risk Score and Plan: 2 and Ondansetron, Dexamethasone, Midazolam and Propofol infusion  Airway Management Planned: Natural Airway and Simple Face Mask  Additional Equipment:   Intra-op Plan:   Post-operative Plan:   Informed Consent: I have reviewed the patients History and Physical, chart, labs and discussed the procedure including the risks, benefits and alternatives for the proposed anesthesia with the patient or authorized representative who has indicated his/her understanding and acceptance.     Dental advisory given  Plan Discussed with: CRNA  Anesthesia Plan Comments:        Anesthesia Quick  Evaluation

## 2021-12-28 NOTE — H&P (Signed)
Grace Pennington is an 51 y.o. female.   Chief Complaint: breast cancer HPI: The patient is a 51 year old black female who has a history of right breast cancer.  She has completed her course of chemotherapy and is now ready to have her port removed.  Past Medical History:  Diagnosis Date   Anxiety    Breast cancer in female [facility])    Right   Depression    Diabetes mellitus without complication ([facility])    GERD (gastroesophageal reflux disease)    Hypertension    Palpitations    Sleep apnea     Past Surgical History:  Procedure Laterality Date   BREAST LUMPECTOMY WITH RADIOACTIVE SEED AND SENTINEL LYMPH NODE BIOPSY Right 10/16/2020   Procedure: RIGHT BREAST LUMPECTOMY WITH RADIOACTIVE SEED AND SENTINEL LYMPH NODE BIOPSY;  Surgeon: [name], MD;  Location: [place];  Service: General;  Laterality: Right;   PORTACATH PLACEMENT Left 10/16/2020   Procedure: INSERTION PORT-A-CATH;  Surgeon: [name], MD;  Location: [facility];  Service: General;  Laterality: Left;   TUBAL LIGATION      Family History  Problem Relation Age of Onset   Heart disease Mother    Diabetes Mother    Hyperlipidemia Mother    Hypertension Mother    Kidney failure Mother    Diabetes Other    Stroke Other    Hypertension Other    Kidney failure Other    Coronary artery disease Other    Sleep apnea Father    Diabetes Father    Hyperlipidemia Father    Hypertension Father    Social History:  reports that she quit smoking about 25 years ago. Her smoking use included cigarettes. She has never used smokeless tobacco. She reports current alcohol use. She reports that she does not use drugs.  Allergies: No Known Allergies  Medications Prior to Admission  Medication Sig Dispense Refill   carvedilol (COREG) 3.125 MG tablet Take 1 tablet by mouth 2 times daily. 60 tablet 3   citalopram (CELEXA) 10 MG tablet TAKE 1 TABLET BY MOUTH ONCE DAILY 90 tablet 3   hydrochlorothiazide (HYDRODIURIL) 25 MG tablet TAKE 1 TABLET  BY MOUTH ONCE DAILY 90 tablet 3   letrozole (FEMARA) 2.5 MG tablet Take 1 tablet by mouth daily. 90 tablet 3   losartan (COZAAR) 50 MG tablet Take 1 tablet (50 mg total) by mouth daily. 90 tablet 2   metFORMIN (GLUCOPHAGE) 1000 MG tablet Take 1,000 mg by mouth 2 (two) times daily with a meal.     Multiple Vitamins-Minerals (MULTIVITAMIN WITH MINERALS) tablet Take 1 tablet by mouth daily.     pantoprazole (PROTONIX) 40 MG tablet TAKE 1 TABLET BY MOUTH DAILY.  Need follow up with MD for more refills. 90 tablet 1   rosuvastatin (CRESTOR) 10 MG tablet Take 1 tablet by mouth once a day 90 tablet 3   spironolactone (ALDACTONE) 50 MG tablet Take 1 tablet by mouth twice a day 60 tablet 11   ALPRAZolam (XANAX) 0.25 MG tablet Take 1 tablet by mouth once a day if needed 30 tablet 3   influenza vac split quadrivalent PF (FLUARIX) 0.5 ML injection Inject 0.5 mLs into the muscle. 0.5 mL 0   pantoprazole (PROTONIX) 40 MG tablet Take 1 tablet (40 mg total) by mouth daily. 30 tablet 1   WEGOVY 0.25 MG/0.5ML SOAJ Inject 0.25 mg into the skin once a week. 2 mL 0    Results for orders placed or performed  during the hospital encounter of 12/28/21 (from the past 48 hour(s))  Glucose, capillary     Status: None   Collection Time: 12/28/21 11:25 AM  Result Value Ref Range   Glucose-Capillary 89 70 - 99 mg/dL    Comment: Glucose reference range applies only to samples taken after fasting for at least 8 hours.   No results found.  Review of Systems  Constitutional: Negative.   HENT: Negative.    Eyes: Negative.   Respiratory: Negative.    Cardiovascular: Negative.   Gastrointestinal: Negative.   Endocrine: Negative.   Genitourinary: Negative.   Musculoskeletal: Negative.   Skin: Negative.   Allergic/Immunologic: Negative.   Neurological: Negative.   Hematological: Negative.   Psychiatric/Behavioral: Negative.     Blood pressure 118/86, pulse 67, temperature 97.9 F (36.6 C), temperature source Oral,  resp. rate 18, height '5\' 6"'$  (1.676 m), weight 114.7 kg, last menstrual period 01/13/2018, SpO2 98 %. Physical Exam Constitutional:      Appearance: Normal appearance.  HENT:     Head: Normocephalic and atraumatic.     Right Ear: External ear normal.     Left Ear: External ear normal.     Nose: Nose normal.     Mouth/Throat:     Mouth: Mucous membranes are moist.     Pharynx: Oropharynx is clear.  Eyes:     Extraocular Movements: Extraocular movements intact.     Conjunctiva/sclera: Conjunctivae normal.     Pupils: Pupils are equal, round, and reactive to light.  Cardiovascular:     Rate and Rhythm: Normal rate and regular rhythm.  Pulmonary:     Effort: Pulmonary effort is normal.     Breath sounds: Normal breath sounds.  Abdominal:     General: Abdomen is flat. Bowel sounds are normal.     Palpations: Abdomen is soft.  Musculoskeletal:        General: No swelling or deformity. Normal range of motion.     Cervical back: Normal range of motion and neck supple.  Skin:    General: Skin is warm and dry.  Neurological:     General: No focal deficit present.     Mental Status: She is alert and oriented to person, place, and time.  Psychiatric:        Mood and Affect: Mood normal.        Behavior: Behavior normal.     Assessment/Plan The patient is about a year after lumpectomy for breast cancer.  She has completed her chemotherapy and is now ready to have her port removed.  I have discussed with her in detail the risks and benefits of the operation as well as some of the technical aspects and she understands and wishes to proceed  [name], MD 12/28/2021, 11:56 AM

## 2021-12-28 NOTE — Op Note (Signed)
12/28/2021  12:34 PM  PATIENT:  Grace Pennington  51 y.o. female  PRE-OPERATIVE DIAGNOSIS:  RIGHT BREAST CANCER  POST-OPERATIVE DIAGNOSIS:  RIGHT BREAST CANCER  PROCEDURE:  Procedure(s): REMOVAL PORT-A-CATH (N/A)  SURGEON:  Surgeon(s) and Role:    * [name], MD - Primary  PHYSICIAN ASSISTANT:   ASSISTANTS: none   ANESTHESIA:   local and general  EBL:  minimal   BLOOD ADMINISTERED:none  DRAINS: none   LOCAL MEDICATIONS USED:  MARCAINE     SPECIMEN:  No Specimen  DISPOSITION OF SPECIMEN:  N/A  COUNTS:  YES  TOURNIQUET:  * No tourniquets in log *  DICTATION: .Dragon Dictation  After informed consent was obtained the patient was brought to the operating room and placed in the supine position on the operating table.  After adequate IV sedation had been given the patient's left chest and neck area were prepped with ChloraPrep, allowed to dry, and draped in usual sterile manner.  An appropriate timeout was performed.  The area around the port was infiltrated with 1% lidocaine until a good field block was created.  A small incision was made with a 15 blade knife through her previous incision.  The incision was carried through the subcutaneous tissue sharply with a 15 blade knife until the capsule surrounding the port was opened.  The 2 anchoring stitches were divided and removed.  With gentle traction the port was removed from the patient without difficulty.  Pressure was held for several minutes until the area was completely hemostatic.  The port tract opening was closed with a figure-of-eight 3-0 Vicryl stitch.  The subcutaneous tissue was closed with interrupted 3-0 Vicryl stitches.  The skin was then closed with interrupted 4-0 Monocryl subcuticular stitches.  Dermabond dressings were applied.  The patient tolerated the procedure well.  At the end of the case all needle sponge and instrument counts were correct.  The patient was then awakened and taken to recovery in stable  condition.  PLAN OF CARE: Discharge to home after PACU  PATIENT DISPOSITION:  PACU - hemodynamically stable.   Delay start of Pharmacological VTE agent (>24hrs) due to surgical blood loss or risk of bleeding: not applicable

## 2021-12-31 ENCOUNTER — Encounter (HOSPITAL_BASED_OUTPATIENT_CLINIC_OR_DEPARTMENT_OTHER): Payer: Self-pay | Admitting: General Surgery

## 2022-01-02 ENCOUNTER — Other Ambulatory Visit (HOSPITAL_COMMUNITY): Payer: Self-pay

## 2022-01-02 MED ORDER — PANTOPRAZOLE SODIUM 40 MG PO TBEC
DELAYED_RELEASE_TABLET | ORAL | 11 refills | Status: AC
  Filled 2022-01-02: qty 30, 30d supply, fill #0
  Filled 2022-01-21: qty 90, 90d supply, fill #0
  Filled 2022-04-18: qty 90, 90d supply, fill #1
  Filled 2022-07-24 (×2): qty 90, 90d supply, fill #2
  Filled 2022-10-19: qty 90, 90d supply, fill #3

## 2022-01-11 ENCOUNTER — Telehealth: Payer: Self-pay

## 2022-01-11 NOTE — Telephone Encounter (Signed)
Pt returned call and states we did not leave a VM. Advised pt, per Chanler's note 6/15, pt's VM box is full. Confirmed appt for 7/17 but pt wishes to change appt to virtual. Appt changed. Pt knows to call with any further questions/concerns.

## 2022-01-17 ENCOUNTER — Other Ambulatory Visit: Payer: Self-pay | Admitting: Nurse Practitioner

## 2022-01-22 ENCOUNTER — Other Ambulatory Visit (HOSPITAL_COMMUNITY): Payer: Self-pay

## 2022-01-24 ENCOUNTER — Encounter (HOSPITAL_COMMUNITY): Payer: No Typology Code available for payment source | Admitting: Internal Medicine

## 2022-01-24 ENCOUNTER — Ambulatory Visit (HOSPITAL_COMMUNITY): Payer: No Typology Code available for payment source

## 2022-01-28 ENCOUNTER — Ambulatory Visit
Admission: RE | Admit: 2022-01-28 | Discharge: 2022-01-28 | Disposition: A | Discharge status: Home / Self Care | Payer: No Typology Code available for payment source | Source: Ambulatory Visit | Attending: Hematology and Oncology | Admitting: Hematology and Oncology

## 2022-01-28 DIAGNOSIS — C50211 Malignant neoplasm of upper-inner quadrant of right female breast: Secondary | ICD-10-CM

## 2022-01-28 MED ORDER — GADOBUTROL 1 MMOL/ML IV SOLN
10.0000 mL | Freq: Once | INTRAVENOUS | Status: AC | PRN
Start: 2022-01-28 — End: 2022-01-28
  Administered 2022-01-28: 10 mL via INTRAVENOUS

## 2022-01-30 ENCOUNTER — Other Ambulatory Visit: Payer: Self-pay | Admitting: *Deleted

## 2022-01-30 ENCOUNTER — Other Ambulatory Visit: Payer: Self-pay | Admitting: Hematology and Oncology

## 2022-01-30 DIAGNOSIS — R928 Other abnormal and inconclusive findings on diagnostic imaging of breast: Secondary | ICD-10-CM

## 2022-01-30 DIAGNOSIS — Z170 Estrogen receptor positive status [ER+]: Secondary | ICD-10-CM

## 2022-01-30 NOTE — Progress Notes (Signed)
Verbal orders received from MD to place orders for left breast US and biopsy to evaluate a 7 mm mass as well as a MRI guided biopsy of the right breast to evaluate a 1.4 cm area found on recent breast MRI.  MD attempt x1 to contact pt to discuss results and plan, no answer and MD LVM.  Orders placed and MD f/u will be scheduled 1 week post imaging to review results.

## 2022-01-31 ENCOUNTER — Other Ambulatory Visit: Payer: Self-pay | Admitting: *Deleted

## 2022-01-31 ENCOUNTER — Other Ambulatory Visit: Payer: No Typology Code available for payment source

## 2022-02-04 ENCOUNTER — Other Ambulatory Visit: Payer: No Typology Code available for payment source

## 2022-02-07 ENCOUNTER — Other Ambulatory Visit (HOSPITAL_COMMUNITY): Payer: Self-pay

## 2022-02-07 MED ORDER — METFORMIN HCL 1000 MG PO TABS
ORAL_TABLET | ORAL | 3 refills | Status: DC
  Filled 2022-02-07: qty 180, 90d supply, fill #0

## 2022-02-08 ENCOUNTER — Ambulatory Visit
Admission: RE | Admit: 2022-02-08 | Discharge: 2022-02-08 | Disposition: A | Discharge status: Home / Self Care | Payer: No Typology Code available for payment source | Source: Ambulatory Visit | Attending: Hematology and Oncology | Admitting: Hematology and Oncology

## 2022-02-08 DIAGNOSIS — R928 Other abnormal and inconclusive findings on diagnostic imaging of breast: Secondary | ICD-10-CM

## 2022-02-11 ENCOUNTER — Encounter: Payer: Self-pay | Admitting: Adult Health

## 2022-02-11 ENCOUNTER — Inpatient Hospital Stay: Payer: No Typology Code available for payment source | Attending: Hematology and Oncology | Admitting: Adult Health

## 2022-02-11 ENCOUNTER — Encounter: Payer: No Typology Code available for payment source | Admitting: Adult Health

## 2022-02-11 DIAGNOSIS — C50211 Malignant neoplasm of upper-inner quadrant of right female breast: Secondary | ICD-10-CM | POA: Diagnosis not present

## 2022-02-11 DIAGNOSIS — Z170 Estrogen receptor positive status [ER+]: Secondary | ICD-10-CM | POA: Diagnosis not present

## 2022-02-11 NOTE — Progress Notes (Unsigned)
SURVIVORSHIP VIRTUAL VISIT:  I connected with Grace Pennington on 02/11/22 at 12:45 PM EDT by my chart video and verified that I am speaking with the correct person using two identifiers.  I discussed the limitations, risks, security and privacy concerns of performing an evaluation and management service by telephone and the availability of in person appointments. I also discussed with the patient that there may be a patient responsible charge related to this service. The patient expressed understanding and agreed to proceed.   Patient location: home, [place], [place] Provider location: Private office, [place], [place] BRIEF ONCOLOGIC HISTORY:  Oncology History  Malignant neoplasm of upper-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, estrogen receptor positive ([place])  09/08/2020 Cancer Staging   Staging form: Breast, AJCC 8th Edition - Clinical stage from 09/08/2020: Stage IA (cT1b, cN0, cM0, G2, ER+, PR+, HER2+) - Signed by Gardenia Phlegm, NP on 09/20/2020 Stage prefix: Initial diagnosis   09/08/2020 Initial Diagnosis   Mammogram detected 0.7 cm mass in the right breast 1 o'clock position, right breast biopsy 1: Grade 2 IDC ER 95% PR 95%, Ki-67 30%, HER-2 positive ratio 2.83   10/16/2020 Surgery   Right lumpectomy [name]): IDC, 1.0cm, grade 3, with high grade DCIS, clear margins, 4 right axillary lymph nodes negative for carcinoma.   10/16/2020 Cancer Staging   Staging form: Breast, AJCC 8th Edition - Pathologic stage from 10/16/2020: Stage IA (pT1b, pN0, cM0, G3, ER+, PR+, HER2+) - Signed by Gardenia Phlegm, NP on 11/01/2020 Histologic grading system: 3 grade system   11/13/2020 - 12/14/2021 Chemotherapy   Patient is on Treatment Plan : BREAST Paclitaxel + Trastuzumab q7d / Trastuzumab q21d     02/15/2021 - 04/04/2021 Radiation Therapy   Site Technique Total Dose (Gy) Dose per Fx (Gy) Completed Fx Beam Energies  Breast, Right: Breast_Rt 3D 50.4/50.4 1.8 28/28 10X  Breast, Right:  Breast_Rt_Bst 3D 10/10 2 5/5 6X, 10X     07/20/2021 -  Anti-estrogen oral therapy   Letrozole x 7 years   11/23/2021 Miscellaneous   Genetics (done at [facility]):  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     Overall, Grace Pennington reports feeling moderately well.  On January 29, 2019 underwent breast MRI and it noted a 7 mm mass in the anterior upper inner right breast.  Recommendation was for her to undergo ultrasound to see if they could identify the mass.  She underwent ultrasound on July 14 and they were unable to see the mass.  She understands now that she needs MRI biopsy however is frustrated because she wishes that could have been completed when she went for the MRI initially.    REVIEW OF SYSTEMS:  Review of Systems  Constitutional:  Negative for appetite change, chills, fatigue, fever and unexpected weight change.  HENT:   Negative for hearing loss, lump/mass and trouble swallowing.   Eyes:  Negative for eye problems and icterus.  Respiratory:  Negative for chest tightness, cough and shortness of breath.   Cardiovascular:  Negative for chest pain, leg swelling and palpitations.  Gastrointestinal:  Negative for abdominal distention, abdominal pain, constipation, diarrhea, nausea and vomiting.  Endocrine: Negative for hot flashes.  Genitourinary:  Negative for difficulty urinating.   Musculoskeletal:  Negative for arthralgias.  Skin:  Negative for itching and rash.  Neurological:  Negative for dizziness, extremity weakness, headaches and numbness.  Hematological:  Negative for adenopathy. Does not bruise/bleed easily.  Psychiatric/Behavioral:  Negative for depression. The patient is not  nervous/anxious.   Breast: Denies any new nodularity, masses, tenderness, nipple changes, or nipple discharge.   ONCOLOGY TREATMENT TEAM:  1. Surgeon:  Dr. [name] at [facility] 2. Medical Oncologist: Dr. Lindi Adie  3. Radiation Oncologist: Dr. Lisbeth Renshaw    PAST MEDICAL/SURGICAL HISTORY:  Past Medical History:  Diagnosis Date   Anxiety    Breast cancer in female [facility])    Right   Depression    Diabetes mellitus without complication ([place])    GERD (gastroesophageal reflux disease)    Hypertension    Palpitations    Sleep apnea    Past Surgical History:  Procedure Laterality Date   BREAST LUMPECTOMY WITH RADIOACTIVE SEED AND SENTINEL LYMPH NODE BIOPSY Right 10/16/2020   Procedure: RIGHT BREAST LUMPECTOMY WITH RADIOACTIVE SEED AND SENTINEL LYMPH NODE BIOPSY;  Surgeon: [name], MD;  Location: [place];  Service: General;  Laterality: Right;   PORT-A-CATH REMOVAL N/A 12/28/2021   Procedure: REMOVAL PORT-A-CATH;  Surgeon: [name], MD;  Location: [place];  Service: General;  Laterality: N/A;   PORTACATH PLACEMENT Left 10/16/2020   Procedure: INSERTION PORT-A-CATH;  Surgeon: [name], MD;  Location: [place];  Service: General;  Laterality: Left;   TUBAL LIGATION       ALLERGIES:  No Known Allergies   CURRENT MEDICATIONS:  Outpatient Encounter Medications as of 02/11/2022  Medication Sig   ALPRAZolam (XANAX) 0.25 MG tablet Take 1 tablet by mouth once a day if needed   carvedilol (COREG) 3.125 MG tablet Take 1 tablet by mouth 2 times daily.   citalopram (CELEXA) 10 MG tablet TAKE 1 TABLET BY MOUTH ONCE DAILY   hydrochlorothiazide (HYDRODIURIL) 25 MG tablet TAKE 1 TABLET BY MOUTH ONCE DAILY   influenza vac split quadrivalent PF (FLUARIX) 0.5 ML injection Inject 0.5 mLs into the muscle.   letrozole (FEMARA) 2.5 MG tablet Take 1 tablet by mouth daily.   losartan (COZAAR) 50 MG tablet Take 1 tablet (50 mg total) by mouth daily.   metFORMIN (GLUCOPHAGE)  1000 MG tablet Take 1,000 mg by mouth 2 (two) times daily with a meal.   metFORMIN (GLUCOPHAGE) 1000 MG tablet Take 1 tablet by mouth twice a day with a meal   Multiple Vitamins-Minerals (MULTIVITAMIN WITH MINERALS) tablet Take 1 tablet by mouth daily.   oxyCODONE (ROXICODONE) 5 MG immediate release tablet Take 1 tablet by mouth every 6 hours as needed for severe pain.   pantoprazole (PROTONIX) 40 MG tablet TAKE 1 TABLET BY MOUTH DAILY.  Need follow up with MD for more refills.   pantoprazole (PROTONIX) 40 MG tablet Take 1 tablet (40 mg total) by mouth daily.   pantoprazole (PROTONIX) 40 MG tablet Take 1 tablet by mouth once daily.   rosuvastatin (CRESTOR) 10 MG tablet Take 1 tablet by mouth once a day   spironolactone (ALDACTONE) 50 MG tablet Take 1 tablet by mouth twice a day   WEGOVY 0.25 MG/0.5ML SOAJ Inject 0.25 mg into the skin once a week.   No facility-administered encounter medications on file as of 02/11/2022.     ONCOLOGIC FAMILY HISTORY:  Family History  Problem Relation Age of Onset   Heart disease Mother    Diabetes Mother    Hyperlipidemia  Mother    Hypertension Mother    Kidney failure Mother    Diabetes Other    Stroke Other    Hypertension Other    Kidney failure Other    Coronary artery disease Other    Sleep apnea Father    Diabetes Father    Hyperlipidemia Father    Hypertension Father      GENETIC COUNSELING/TESTING: Palb2 and mutyh  SOCIAL HISTORY:  Social History   Socioeconomic History   Marital status: Single    Spouse name: Not on file   Number of children: 2   Years of education: Not on file   Highest education level: Not on file  Occupational History   Occupation: medical records    Employer: [employer]  Tobacco Use   Smoking status: Former    Types: Cigarettes    Quit date: 07/29/1996    Years since quitting: 25.5   Smokeless tobacco: Never  Vaping Use   Vaping Use: Never used  Substance and Sexual Activity   Alcohol use: Yes     Comment: socially   Drug use: No   Sexual activity: Yes    Birth control/protection: Surgical  Other Topics Concern   Not on file  Social History Narrative   Not on file   Social Determinants of Health   Financial Resource Strain: Not on file  Food Insecurity: Not on file  Transportation Needs: Not on file  Physical Activity: Not on file  Stress: Not on file  Social Connections: Not on file  Intimate Partner Violence: Not At Risk (01/24/2021)   Humiliation, Afraid, Rape, and Kick questionnaire    Fear of Current or Ex-Partner: No    Emotionally Abused: No    Physically Abused: No    Sexually Abused: No     OBSERVATIONS/OBJECTIVE:  Patient appears well.  She is in no apparent distress.  Mood and behavior are normal and skin visualized without rash or lesions.  LABORATORY DATA:  None for this visit.  DIAGNOSTIC IMAGING:  None for this visit.      ASSESSMENT AND PLAN:  Ms.. Genrich is a pleasant 51 y.o. female with Stage IA right breast invasive ductal carcinoma, ER+/PR+/HER2+, diagnosed in February 2022, treated with lumpectomy, adjuvant chemotherapy, maintenance trastuzumab, adjuvant radiation therapy, and anti-estrogen therapy with letrozole daily beginning in December 2022.  She presents to the Survivorship Clinic for our initial meeting and routine follow-up post-completion of treatment for breast cancer.    1. Stage 1A right breast cancer:  Ms. Christensen is continuing to recover from definitive treatment for breast cancer. She will follow-up with her medical oncologist, Dr. Lindi Adie in October 2023 with history and physical exam per surveillance protocol.  She will continue her anti-estrogen therapy with letrozole. Thus far, she is tolerating the letrozole well, with minimal side effects. She was instructed to make Dr. Lindi Adie or myself aware if she begins to experience any worsening side effects of the medication and I could see her back in clinic to help manage those side  effects, as needed. Today, a comprehensive survivorship care plan and treatment summary was reviewed with the patient today detailing her breast cancer diagnosis, treatment course, potential late/long-term effects of treatment, appropriate follow-up care with recommendations for the future, and patient education resources.  A copy of this summary, along with a letter will be sent to the patient's primary care provider via mail/fax/In Basket message after today's visit.    2.  Abnormal breast MRI: Second look ultrasound did  not show any evidence of the area noted on the MRI therefore she will need an MRI guided biopsy.  I reviewed with her that MRIs are very sensitive test so they show everything going on in the breasts.  This means that they have a higher false positive rate and she verbalized understanding of this.  3. PALB2 and MUTYH mutation: We discussed recommendations for her which include annual breast MRI which she recently completed, risk reducing oophorectomy--which we will refer her for today, and increased colon cancer screening as directed by her gastroenterologist.  She does not have a family history of pancreatic cancer.  She knows that if her family history changes at any time to please let us know as that could change recommendations.  4. Bone health:  Given Ms. Molla's age/history of breast cancer and her current treatment regimen including anti-estrogen therapy with letrozole, she is at risk for bone demineralization.  Her last DEXA scan was August 30, 2021 showing a T score of -2.5 in the AP lumbar spine L1-L4 consistent with osteoporosis.  She is optimizing calcium and vitamin D intake and is due for repeat bone density testing in February 2025.  She was given education on specific activities to promote bone health.  5. Cancer screening:  Due to Ms. Fendley's history and her age, she should receive screening for skin cancers, colon cancer, and gynecologic cancers.  The information and  recommendations are listed on the patient's comprehensive care plan/treatment summary and were reviewed in detail with the patient.    6. Health maintenance and wellness promotion: Ms. Ihrig was encouraged to consume 5-7 servings of fruits and vegetables per day. We reviewed the "Nutrition Rainbow" handout.  She was also encouraged to engage in moderate to vigorous exercise for 30 minutes per day most days of the week. We discussed the [employer] fitness program, which is designed for cancer survivors to help them become more physically fit after cancer treatments.  She was instructed to limit her alcohol consumption and continue to abstain from tobacco use.     7. Support services/counseling: It is not uncommon for this period of the patient's cancer care trajectory to be one of many emotions and stressors. She was given information regarding our available services and encouraged to contact me with any questions or for help enrolling in any of our support group/programs.    Follow up instructions:    -Return to cancer center in October 2023 for follow-up with Dr. Lindi Adie -MRI guided biopsy of breast abnormality -Referral to Dr. Berline Lopes to discuss oopherectomy -She is welcome to return back to the Survivorship Clinic at any time; no additional follow-up needed at this time.  -Consider referral back to survivorship as a long-term survivor for continued surveillance  The patient was provided an opportunity to ask questions and all were answered. The patient agreed with the plan and demonstrated an understanding of the instructions.   Total encounter time:30 minutes*in face-to-face visit time, chart review, lab review, care coordination, order entry, and documentation of the encounter time.  Wilber Bihari, NP 02/11/22 8:17 AM Medical Oncology and Hematology [facility] [place], [place] [address] Tel. [phone]    Fax. [phone]  *Total Encounter Time as  defined by the Centers for Medicare and Medicaid Services includes, in addition to the face-to-face time of a patient visit (documented in the note above) non-face-to-face time: obtaining and reviewing outside history, ordering and reviewing medications, tests or procedures, care coordination (communications with  other health care professionals or caregivers) and documentation in the medical record.

## 2022-02-14 ENCOUNTER — Telehealth: Payer: Self-pay | Admitting: *Deleted

## 2022-02-14 ENCOUNTER — Other Ambulatory Visit: Payer: Self-pay | Admitting: Hematology and Oncology

## 2022-02-14 ENCOUNTER — Encounter: Payer: Self-pay | Admitting: *Deleted

## 2022-02-14 DIAGNOSIS — R928 Other abnormal and inconclusive findings on diagnostic imaging of breast: Secondary | ICD-10-CM

## 2022-02-14 NOTE — Telephone Encounter (Signed)
Spoke with the patient regarding the referral to GYN oncology. Patient scheduled a new patient with Dr Berline Lopes on 8/18 at 9:45 am. Patient given an arrival time of 9:15 am.  Explained to the patient the the doctor will perform a pelvic exam at this visit. Patient given the policy that no visitors under the 16 yrs are a loud in the [place]. Patient given the address/phone number for the clinic and that the center offers free valet service.

## 2022-02-22 ENCOUNTER — Ambulatory Visit
Admission: RE | Admit: 2022-02-22 | Discharge: 2022-02-22 | Disposition: A | Discharge status: Home / Self Care | Payer: No Typology Code available for payment source | Source: Ambulatory Visit | Attending: Hematology and Oncology | Admitting: Hematology and Oncology

## 2022-02-22 DIAGNOSIS — R928 Other abnormal and inconclusive findings on diagnostic imaging of breast: Secondary | ICD-10-CM

## 2022-02-22 DIAGNOSIS — C50211 Malignant neoplasm of upper-inner quadrant of right female breast: Secondary | ICD-10-CM

## 2022-02-22 MED ORDER — GADOBUTROL 1 MMOL/ML IV SOLN
10.0000 mL | Freq: Once | INTRAVENOUS | Status: AC | PRN
  Administered 2022-02-22: 10 mL via INTRAVENOUS

## 2022-02-25 ENCOUNTER — Encounter: Payer: Self-pay | Admitting: *Deleted

## 2022-03-12 ENCOUNTER — Encounter: Payer: Self-pay | Admitting: Gynecologic Oncology

## 2022-03-13 NOTE — Progress Notes (Signed)
GYNECOLOGIC ONCOLOGY NEW PATIENT CONSULTATION   Patient Name: Grace Pennington  Patient Age: 51 y.o. Date of Service: 03/15/2022 Referring Provider: Dr. Nicholas Lose  Primary Care Provider: Seward Carol, MD Consulting Provider: Jeral Pinch, MD   Assessment/Plan:  Postmenopausal patient with history of breast cancer and known PALB2.  Patient and I reviewed her genetic testing, which showed a PALB2 mutation.  PALB2 mutations are associated with a 3-5% risk of epithelial ovarian cancer.  NCCN recommends consideration of risk-reducing salpingo-oophorectomy after the age of 16.    We discussed the small risk of occult cancer at the time of surgery.  If I were to see any concerning findings for malignancy at the time of surgery, plan would be to send tubes and ovaries for frozen section.  We discussed other procedures that would be indicated for malignancy including total hysterectomy, peritoneal biopsies, omentectomy, and lymphadenectomy.  Her genetic testing does not show an increased risk of endometrial cancer.  If benign appearing tubes and ovaries at the time of surgery, we discussed that there is not an indication for concurrent hysterectomy.  Plan for a robotic assisted bilateral salpingo-oophorectomy, possible staging including total hysterectomy, possible laparotomy. The risks of surgery were discussed in detail and she understands these to include infection; wound separation; hernia; vaginal cuff separation, injury to adjacent organs such as bowel, bladder, blood vessels, ureters and nerves; bleeding which may require blood transfusion; anesthesia risk; thromboembolic events; possible death; unforeseen complications; possible need for re-exploration; medical complications such as heart attack, stroke, pleural effusion and pneumonia; and, if full lymphadenectomy is performed the risk of lymphedema and lymphocyst. The patient will receive DVT and antibiotic prophylaxis as indicated. She  voiced a clear understanding. She had the opportunity to ask questions. Perioperative instructions were reviewed with her. Prescriptions for post-op medications were sent to her pharmacy of choice.  A copy of this note was sent to the patient's referring provider.   65 minutes of total time was spent for this patient encounter, including preparation, face-to-face counseling with the patient and coordination of care, and documentation of the encounter.   Jeral Pinch, MD  Division of Gynecologic Oncology  Department of Obstetrics and Gynecology  [facility]  ___________________________________________  Chief Complaint: Chief Complaint  Patient presents with   Malignant neoplasm of upper-inner quadrant of right breast     History of Present Illness:  Grace Pennington is a 51 y.o. y.o. female who is seen in consultation at the request of Dr. Lindi Adie for an evaluation of risk-reducing surgery in the setting of PALB2 mutation.  [facility] genetic testing performed at the end of March 2023 showed pathogenic variants in PALB2 and MUTYH. VUS were noted in AIP, AXIN2 and MSH6.   Patient is currently doing well in terms of her breast cancer history.  She is on letrozole and tolerating this without significant side effects.  Patient reports a good appetite without any nausea or emesis.  She reports normal bowel and bladder function.  She denies any postmenopausal bleeding.  Denies any abdominal or pelvic pain.  Medical history is notable for sleep apnea, uses a CPAP, and non-insulin-dependent diabetes.  Notes that blood glucose at home is 108 at the highest.  Treatment History: Oncology History  Malignant neoplasm of upper-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, estrogen receptor positive ([place])  09/08/2020 Cancer Staging   Staging form: Breast, AJCC 8th Edition - Clinical stage from 09/08/2020: Stage IA (cT1b, cN0, cM0, G2, ER+, PR+, HER2+) -  Signed by Gardenia Phlegm, NP on 09/20/2020 Stage prefix: Initial diagnosis   09/08/2020 Initial Diagnosis   Mammogram detected 0.7 cm mass in the right breast 1 o'clock position, right breast biopsy 1: Grade 2 IDC ER 95% PR 95%, Ki-67 30%, HER-2 positive ratio 2.83   10/16/2020 Surgery   Right lumpectomy [name]): IDC, 1.0cm, grade 3, with high grade DCIS, clear margins, 4 right axillary lymph nodes negative for carcinoma.   10/16/2020 Cancer Staging   Staging form: Breast, AJCC 8th Edition - Pathologic stage from 10/16/2020: Stage IA (pT1b, pN0, cM0, G3, ER+, PR+, HER2+) - Signed by Gardenia Phlegm, NP on 11/01/2020 Histologic grading system: 3 grade system   11/13/2020 - 12/14/2021 Chemotherapy   Patient is on Treatment Plan : BREAST Paclitaxel + Trastuzumab q7d / Trastuzumab q21d     02/15/2021 - 04/04/2021 Radiation Therapy   Site Technique Total Dose (Gy) Dose per Fx (Gy) Completed Fx Beam Energies  Breast, Right: Breast_Rt 3D 50.4/50.4 1.8 28/28 10X  Breast, Right: Breast_Rt_Bst 3D 10/10 2 5/5 6X, 10X     07/20/2021 -  Anti-estrogen oral therapy   Letrozole x 7 years   11/23/2021 Miscellaneous   Genetics (done at [facility]):  PALBS and MUTYH pathogenic mutations (additional VUS)    PAST MEDICAL HISTORY:  Past Medical History:  Diagnosis Date   Anxiety    Breast cancer in female [facility])    Right   Depression    Diabetes mellitus without complication ([place])    metformin   GERD (gastroesophageal reflux disease)    Hypertension    Palpitations    Port-A-Cath in place 11/20/2020   Sleep apnea    uses CPAP     PAST SURGICAL HISTORY:  Past Surgical History:  Procedure Laterality Date   BREAST LUMPECTOMY WITH RADIOACTIVE SEED AND SENTINEL LYMPH NODE BIOPSY Right 10/16/2020   Procedure: RIGHT BREAST LUMPECTOMY WITH RADIOACTIVE SEED AND SENTINEL LYMPH NODE BIOPSY;  Surgeon: [name], MD;  Location: [place];  Service: General;  Laterality: Right;   PORT-A-CATH REMOVAL N/A 12/28/2021    Procedure: REMOVAL PORT-A-CATH;  Surgeon: [name], MD;  Location: [place];  Service: General;  Laterality: N/A;   PORTACATH PLACEMENT Left 10/16/2020   Procedure: INSERTION PORT-A-CATH;  Surgeon: [name], MD;  Location: [facility];  Service: General;  Laterality: Left;   TUBAL LIGATION      OB/GYN HISTORY:  OB History  Gravida Para Term Preterm AB Living  2 2 2     2   SAB IAB Ectopic Multiple Live Births               # Outcome Date GA Lbr Len/2nd Weight Sex Delivery Anes PTL Lv  2 Term           1 Term             Patient's last menstrual period was 12/22/2017.  Age at menarche: 26 Age at menopause: 3 Hx of HRT: Denies Hx of STDs: Denies Last pap: 2023 - NIML, HR HPV negative History of abnormal pap smears: Denies  SCREENING STUDIES:  Last mammogram: 2023  Last colonoscopy: 2021 Last bone mineral density: 2023  MEDICATIONS: Outpatient Encounter Medications as of 03/15/2022  Medication Sig   ALPRAZolam (XANAX) 0.25 MG tablet Take 1 tablet by mouth once a day if needed   carvedilol (COREG) 3.125 MG tablet Take 1 tablet by mouth 2 times daily.   citalopram (CELEXA) 10 MG tablet  TAKE 1 TABLET BY MOUTH ONCE DAILY   hydrochlorothiazide (HYDRODIURIL) 25 MG tablet TAKE 1 TABLET BY MOUTH ONCE DAILY   letrozole (FEMARA) 2.5 MG tablet Take 1 tablet by mouth daily.   metFORMIN (GLUCOPHAGE) 500 MG tablet Take 1,000 mg by mouth 2 (two) times daily with a meal.   Multiple Vitamins-Minerals (MULTIVITAMIN WITH MINERALS) tablet Take 1 tablet by mouth daily.   oxyCODONE (OXY IR/ROXICODONE) 5 MG immediate release tablet Take 1 tablet by mouth every 4 hours as needed for severe pain. For AFTER surgery only, do not take and drive   pantoprazole (PROTONIX) 40 MG tablet Take 1 tablet by mouth once daily.   rosuvastatin (CRESTOR) 10 MG tablet Take 1 tablet by mouth once a day   senna-docusate (SENOKOT-S) 8.6-50 MG tablet Take 2 tablets by mouth at bedtime. For AFTER  surgery, do not take if having diarrhea   spironolactone (ALDACTONE) 50 MG tablet Take 1 tablet by mouth twice a day   [DISCONTINUED] losartan (COZAAR) 50 MG tablet Take 1 tablet (50 mg total) by mouth daily.   No facility-administered encounter medications on file as of 03/15/2022.    ALLERGIES:  No Known Allergies   FAMILY HISTORY:  Family History  Problem Relation Age of Onset   Heart disease Mother    Diabetes Mother    Hyperlipidemia Mother    Hypertension Mother    Kidney failure Mother    Breast cancer Mother    Sleep apnea Father    Diabetes Father    Hyperlipidemia Father    Hypertension Father    Prostate cancer Father    Diabetes Other    Stroke Other    Hypertension Other    Kidney failure Other    Coronary artery disease Other    Colon cancer Neg Hx    Ovarian cancer Neg Hx    Endometrial cancer Neg Hx    Pancreatic cancer Neg Hx      SOCIAL HISTORY:  Social Connections: Not on file    REVIEW OF SYSTEMS:  Denies appetite changes, fevers, chills, fatigue, unexplained weight changes. Denies hearing loss, neck lumps or masses, mouth sores, ringing in ears or voice changes. Denies cough or wheezing.  Denies shortness of breath. Denies chest pain or palpitations. Denies leg swelling. Denies abdominal distention, pain, blood in stools, constipation, diarrhea, nausea, vomiting, or early satiety. Denies pain with intercourse, dysuria, frequency, hematuria or incontinence. Denies hot flashes, pelvic pain, vaginal bleeding or vaginal discharge.   Denies joint pain, back pain or muscle pain/cramps. Denies itching, rash, or wounds. Denies dizziness, headaches, numbness or seizures. Denies swollen lymph nodes or glands, denies easy bruising or bleeding. Denies anxiety, depression, confusion, or decreased concentration.  Physical Exam:  Vital Signs for this encounter:  Blood pressure (!) 125/96, pulse 76, temperature 98.4 F (36.9 C), temperature source Oral,  resp. rate 20, height 5' 6"  (1.676 m), weight 252 lb (114.3 kg), last menstrual period 12/22/2017. Body mass index is 40.67 kg/m. General: Alert, oriented, no acute distress.  HEENT: Normocephalic, atraumatic. Sclera anicteric.  Chest: Clear to auscultation bilaterally. No wheezes, rhonchi, or rales. Cardiovascular: Regular rate and rhythm, no murmurs, rubs, or gallops.  Abdomen: Obese. Normoactive bowel sounds. Soft, nondistended, nontender to palpation. No masses or hepatosplenomegaly appreciated. No palpable fluid wave.  Extremities: Grossly normal range of motion. Warm, well perfused. No edema bilaterally.  Skin: No rashes or lesions.  Lymphatics: No cervical, supraclavicular, or inguinal adenopathy.  GU:  Normal external female genitalia.  No lesions. No discharge or bleeding.             Bladder/urethra:  No lesions or masses, well supported bladder             Vagina: Well rugated, no lesions or masses.             Cervix: Normal appearing, no lesions.             Uterus: Small, mobile, no parametrial involvement or nodularity.             Adnexa: No masses appreciated.  Rectal: Deferred.  LABORATORY AND RADIOLOGIC DATA:  Outside medical records were reviewed to synthesize the above history, along with the history and physical obtained during the visit.   Lab Results  Component Value Date   WBC 5.2 10/12/2021   HGB 13.3 10/12/2021   HCT 39.7 10/12/2021   PLT 255 10/12/2021   GLUCOSE 129 (H) 12/26/2021   CHOL 192 08/13/2018   TRIG 43 08/13/2018   HDL 52 08/13/2018   LDLCALC 131 (H) 08/13/2018   ALT 21 10/12/2021   AST 20 10/12/2021   NA 140 12/26/2021   K 3.9 12/26/2021   CL 106 12/26/2021   CREATININE 0.94 12/26/2021   BUN 14 12/26/2021   CO2 28 12/26/2021   TSH 1.730 10/07/2017   HGBA1C 6.2 (H) 08/13/2018

## 2022-03-15 ENCOUNTER — Inpatient Hospital Stay
Payer: No Typology Code available for payment source | Attending: Hematology and Oncology | Admitting: Gynecologic Oncology

## 2022-03-15 ENCOUNTER — Other Ambulatory Visit: Payer: Self-pay | Admitting: Gynecologic Oncology

## 2022-03-15 ENCOUNTER — Inpatient Hospital Stay (HOSPITAL_BASED_OUTPATIENT_CLINIC_OR_DEPARTMENT_OTHER): Payer: No Typology Code available for payment source | Admitting: Gynecologic Oncology

## 2022-03-15 ENCOUNTER — Other Ambulatory Visit (HOSPITAL_COMMUNITY): Payer: Self-pay

## 2022-03-15 ENCOUNTER — Encounter: Payer: Self-pay | Admitting: Gynecologic Oncology

## 2022-03-15 ENCOUNTER — Other Ambulatory Visit: Payer: Self-pay

## 2022-03-15 VITALS — BP 125/96 | HR 76 | Temp 98.4°F | Resp 20 | Ht 66.0 in | Wt 252.0 lb

## 2022-03-15 DIAGNOSIS — F32A Depression, unspecified: Secondary | ICD-10-CM | POA: Insufficient documentation

## 2022-03-15 DIAGNOSIS — G4730 Sleep apnea, unspecified: Secondary | ICD-10-CM | POA: Diagnosis not present

## 2022-03-15 DIAGNOSIS — Z170 Estrogen receptor positive status [ER+]: Secondary | ICD-10-CM

## 2022-03-15 DIAGNOSIS — K219 Gastro-esophageal reflux disease without esophagitis: Secondary | ICD-10-CM | POA: Diagnosis not present

## 2022-03-15 DIAGNOSIS — C50211 Malignant neoplasm of upper-inner quadrant of right female breast: Secondary | ICD-10-CM | POA: Diagnosis present

## 2022-03-15 DIAGNOSIS — Z1502 Genetic susceptibility to malignant neoplasm of ovary: Secondary | ICD-10-CM | POA: Insufficient documentation

## 2022-03-15 DIAGNOSIS — Z7984 Long term (current) use of oral hypoglycemic drugs: Secondary | ICD-10-CM | POA: Insufficient documentation

## 2022-03-15 DIAGNOSIS — I10 Essential (primary) hypertension: Secondary | ICD-10-CM | POA: Insufficient documentation

## 2022-03-15 DIAGNOSIS — Z6841 Body Mass Index (BMI) 40.0 and over, adult: Secondary | ICD-10-CM

## 2022-03-15 DIAGNOSIS — E119 Type 2 diabetes mellitus without complications: Secondary | ICD-10-CM | POA: Diagnosis not present

## 2022-03-15 DIAGNOSIS — F419 Anxiety disorder, unspecified: Secondary | ICD-10-CM | POA: Diagnosis not present

## 2022-03-15 DIAGNOSIS — Z79899 Other long term (current) drug therapy: Secondary | ICD-10-CM | POA: Diagnosis not present

## 2022-03-15 DIAGNOSIS — Z1509 Genetic susceptibility to other malignant neoplasm: Secondary | ICD-10-CM

## 2022-03-15 DIAGNOSIS — Z148 Genetic carrier of other disease: Secondary | ICD-10-CM | POA: Diagnosis present

## 2022-03-15 DIAGNOSIS — G4733 Obstructive sleep apnea (adult) (pediatric): Secondary | ICD-10-CM

## 2022-03-15 DIAGNOSIS — Z79811 Long term (current) use of aromatase inhibitors: Secondary | ICD-10-CM | POA: Diagnosis not present

## 2022-03-15 DIAGNOSIS — C50919 Malignant neoplasm of unspecified site of unspecified female breast: Secondary | ICD-10-CM

## 2022-03-15 DIAGNOSIS — Z1589 Genetic susceptibility to other disease: Secondary | ICD-10-CM

## 2022-03-15 DIAGNOSIS — Z1501 Genetic susceptibility to malignant neoplasm of breast: Secondary | ICD-10-CM | POA: Diagnosis not present

## 2022-03-15 MED ORDER — OXYCODONE HCL 5 MG PO TABS
5.0000 mg | ORAL_TABLET | ORAL | 0 refills | Status: DC | PRN
  Filled 2022-03-15: qty 10, 2d supply, fill #0

## 2022-03-15 MED ORDER — SENNOSIDES-DOCUSATE SODIUM 8.6-50 MG PO TABS
2.0000 | ORAL_TABLET | Freq: Every day | ORAL | 0 refills | Status: DC
  Filled 2022-03-15 – 2022-04-18 (×2): qty 30, 15d supply, fill #0

## 2022-03-15 NOTE — Patient Instructions (Addendum)
Preparing for your Surgery  Plan for surgery on April 17, 2022 with Dr. Jeral Pinch at [facility] will be scheduled for robotic assisted bilateral salpingo-oophorectomy (removal of both ovaries and fallopian tubes), possible staging including total hysterectomy, possible laparotomy (larger incision on your abdomen if needed).    We will reach out to Dr. Lindi Adie about recommendations on when to stop taking your letrozole before surgery and for after.  Pre-operative Testing -You will receive a phone call from presurgical testing at [facility] to arrange for a pre-operative appointment and lab work.  -Bring your insurance card, copy of an advanced directive if applicable, medication list  -At that visit, you will be asked to sign a consent for a possible blood transfusion in case a transfusion becomes necessary during surgery.  The need for a blood transfusion is rare but having consent is a necessary part of your care.     -You should not be taking blood thinners or aspirin at least ten days prior to surgery unless instructed by your surgeon.  -Do not take supplements such as fish oil (omega 3), red yeast rice, turmeric before your surgery. You want to avoid medications with aspirin in them including headache powders such as BC or Goody's), Excedrin migraine.  Day Before Surgery at [facility] will be asked to take in a light diet the day before surgery. You will be advised you can have clear liquids up until 3 hours before your surgery.    Eat a light diet the day before surgery.  Examples including soups, broths, toast, yogurt, mashed potatoes.  AVOID GAS PRODUCING FOODS. Things to avoid include carbonated beverages (fizzy beverages, sodas), raw fruits and raw vegetables (uncooked), or beans.   If your bowels are filled with gas, your surgeon will have difficulty visualizing your pelvic organs which increases your surgical risks.  Your role in recovery Your  role is to become active as soon as directed by your doctor, while still giving yourself time to heal.  Rest when you feel tired. You will be asked to do the following in order to speed your recovery:  - Cough and breathe deeply. This helps to clear and expand your lungs and can prevent pneumonia after surgery.  - [place]. Do mild physical activity. Walking or moving your legs help your circulation and body functions return to normal. Do not try to get up or walk alone the first time after surgery.   -If you develop swelling on one leg or the other, pain in the back of your leg, redness/warmth in one of your legs, please call the office or go to the Emergency Room to have a doppler to rule out a blood clot. For shortness of breath, chest pain-seek care in the Emergency Room as soon as possible. - Actively manage your pain. Managing your pain lets you move in comfort. We will ask you to rate your pain on a scale of zero to 10. It is your responsibility to tell your doctor or nurse where and how much you hurt so your pain can be treated.  Special Considerations -If you are diabetic, you may be placed on insulin after surgery to have closer control over your blood sugars to promote healing and recovery.  This does not mean that you will be discharged on insulin.  If applicable, your oral antidiabetics will be resumed when you are tolerating a solid diet.  -Your final pathology results from surgery should  be available around one week after surgery and the results will be relayed to you when available.  -FMLA forms can be faxed to [phone] and please allow 5-7 business days for completion.  Pain Management After Surgery -You have been prescribed your pain medication and bowel regimen medications before surgery so that you can have these available when you are discharged from the hospital. The pain medication is for use ONLY AFTER surgery and a new prescription will not be given.    -Make sure that you have Tylenol and Ibuprofen IF YOU ARE ABLE TO TAKE THESE MEDICATIONS at home to use on a regular basis after surgery for pain control. We recommend alternating the medications every hour to six hours since they work differently and are processed in the body differently for pain relief. YOUR CELEXA WITH IBUPROFEN USE CAN INCREASE YOUR CHANCE OF GASTROINTESTINAL BLEEDING SO LIMIT USE OF IBUPROFEN AND TAKE WITH FOOD.  -Review the attached handout on narcotic use and their risks and side effects.   Bowel Regimen -You have been prescribed Sennakot-S to take nightly to prevent constipation especially if you are taking the narcotic pain medication intermittently.  It is important to prevent constipation and drink adequate amounts of liquids. You can stop taking this medication when you are not taking pain medication and you are back on your normal bowel routine.  Risks of Surgery Risks of surgery are low but include bleeding, infection, damage to surrounding structures, re-operation, blood clots, and very rarely death.   Blood Transfusion Information (For the consent to be signed before surgery)  We will be checking your blood type before surgery so in case of emergencies, we will know what type of blood you would need.                                            WHAT IS A BLOOD TRANSFUSION?  A transfusion is the replacement of blood or some of its parts. Blood is made up of multiple cells which provide different functions. Red blood cells carry oxygen and are used for blood loss replacement. White blood cells fight against infection. Platelets control bleeding. Plasma helps clot blood. Other blood products are available for specialized needs, such as hemophilia or other clotting disorders. BEFORE THE TRANSFUSION  Who gives blood for transfusions?  You may be able to donate blood to be used at a later date on yourself (autologous donation). Relatives can be asked to donate  blood. This is generally not any safer than if you have received blood from a stranger. The same precautions are taken to ensure safety when a relative's blood is donated. Healthy volunteers who are fully evaluated to make sure their blood is safe. This is blood bank blood. Transfusion therapy is the safest it has ever been in the practice of medicine. Before blood is taken from a donor, a complete history is taken to make sure that person has no history of diseases nor engages in risky social behavior (examples are intravenous drug use or sexual activity with multiple partners). The donor's travel history is screened to minimize risk of transmitting infections, such as malaria. The donated blood is tested for signs of infectious diseases, such as HIV and hepatitis. The blood is then tested to be sure it is compatible with you in order to minimize the chance of a transfusion reaction. If you or a  relative donates blood, this is often done in anticipation of surgery and is not appropriate for emergency situations. It takes many days to process the donated blood. RISKS AND COMPLICATIONS Although transfusion therapy is very safe and saves many lives, the main dangers of transfusion include:  Getting an infectious disease. Developing a transfusion reaction. This is an allergic reaction to something in the blood you were given. Every precaution is taken to prevent this. The decision to have a blood transfusion has been considered carefully by your caregiver before blood is given. Blood is not given unless the benefits outweigh the risks.  AFTER SURGERY INSTRUCTIONS  Return to work: 4-6 weeks if applicable  Activity: 1. Be up and out of the bed during the day.  Take a nap if needed.  You may walk up steps but be careful and use the hand rail.  Stair climbing will tire you more than you think, you may need to stop part way and rest.   2. No lifting or straining for 6 weeks over 10 pounds. No pushing,  pulling, straining for 6 weeks.  3. No driving for 1 week(s).  Do not drive if you are taking narcotic pain medicine and make sure that your reaction time has returned.   4. You can shower as soon as the next day after surgery. Shower daily.  Use your regular soap and water (not directly on the incision) and pat your incision(s) dry afterwards; don't rub.  No tub baths or submerging your body in water until cleared by your surgeon. If you have the soap that was given to you by pre-surgical testing that was used before surgery, you do not need to use it afterwards because this can irritate your incisions.   5. No sexual activity and nothing in the vagina for 4 weeks, 8 weeks if you have a hysterectomy.  6. You may experience a small amount of clear drainage from your incisions, which is normal.  If the drainage persists, increases, or changes color please call the office.  7. Do not use creams, lotions, or ointments such as neosporin on your incisions after surgery until advised by your surgeon because they can cause removal of the dermabond glue on your incisions.    8. You may experience vaginal spotting after surgery or around the 6-8 week mark from surgery when the stitches at the top of the vagina begin to dissolve (if you have a hysterectomy).  The spotting is normal but if you experience heavy bleeding, call our office.  9. Take Tylenol or ibuprofen first for pain if you are able to take these medications and only use narcotic pain medication for severe pain not relieved by the Tylenol or Ibuprofen.  Monitor your Tylenol intake to a max of 4,000 mg in a 24 hour period. You can alternate these medications after surgery.  Diet: 1. Low sodium Heart Healthy Diet is recommended but you are cleared to resume your normal (before surgery) diet after your procedure.  2. It is safe to use a laxative, such as Miralax or Colace, if you have difficulty moving your bowels. You have been prescribed  Sennakot-S to take at bedtime every evening after surgery to keep bowel movements regular and to prevent constipation.    Wound Care: 1. Keep clean and dry.  Shower daily.  Reasons to call the Doctor: Fever - Oral temperature greater than 100.4 degrees Fahrenheit Foul-smelling vaginal discharge Difficulty urinating Nausea and vomiting Increased pain at the site of the  incision that is unrelieved with pain medicine. Difficulty breathing with or without chest pain New calf pain especially if only on one side Sudden, continuing increased vaginal bleeding with or without clots.   Contacts: For questions or concerns you should contact:  Dr. Jeral Pinch at [phone]  Joylene John, NP at [phone]  After Hours: call [phone] and have the GYN Oncologist paged/contacted (after 5 pm or on the weekends).  Messages sent via mychart are for non-urgent matters and are not responded to after hours so for urgent needs, please call the after hours number.

## 2022-03-15 NOTE — Progress Notes (Signed)
Patient here for new patient consultation with Dr. Jeral Pinch and for a pre-operative discussion prior to her scheduled surgery on April 17, 2022. She is scheduled for robotic assisted bilateral salpingo-oophorectomy, possible staging including total hysterectomy, possible laparotomy. The surgery was discussed in detail.  See after visit summary for additional details. Visual aids used to discuss items related to surgery.  Discussed post-op pain management in detail including the aspects of the enhanced recovery pathway.  Advised her that a new prescription would be sent in for oxycodone and it is only to be used for after her upcoming surgery.  We discussed the use of tylenol post-op and to monitor for a maximum of 4,000 mg in a 24 hour period.  Also prescribed sennakot to be used after surgery and to hold if having loose stools.  Discussed bowel regimen in detail.     Discussed the use of SCDs and measures to take at home to prevent DVT including frequent mobility.  Reportable signs and symptoms of DVT discussed. Post-operative instructions discussed and expectations for after surgery. Incisional care discussed as well including reportable signs and symptoms including erythema, drainage, wound separation.     5 minutes spent with the patient.  Verbalizing understanding of material discussed. No needs or concerns voiced at the end of the visit. Advised patient to call for any needs.  Advised that her post-operative medications had been prescribed and could be picked up at any time.    This appointment is included in the global surgical bundle as pre-operative teaching and has no charge.

## 2022-03-15 NOTE — Patient Instructions (Signed)
Preparing for your Surgery   Plan for surgery on April 17, 2022 with Dr. Jeral Pinch at [facility] will be scheduled for robotic assisted bilateral salpingo-oophorectomy (removal of both ovaries and fallopian tubes), possible staging including total hysterectomy, possible laparotomy (larger incision on your abdomen if needed).     We will reach out to Dr. Lindi Adie about recommendations on when to stop taking your letrozole before surgery and for after.   Pre-operative Testing -You will receive a phone call from presurgical testing at [facility] to arrange for a pre-operative appointment and lab work.   -Bring your insurance card, copy of an advanced directive if applicable, medication list   -At that visit, you will be asked to sign a consent for a possible blood transfusion in case a transfusion becomes necessary during surgery.  The need for a blood transfusion is rare but having consent is a necessary part of your care.      -You should not be taking blood thinners or aspirin at least ten days prior to surgery unless instructed by your surgeon.   -Do not take supplements such as fish oil (omega 3), red yeast rice, turmeric before your surgery. You want to avoid medications with aspirin in them including headache powders such as BC or Goody's), Excedrin migraine.   Day Before Surgery at [place] will be asked to take in a light diet the day before surgery. You will be advised you can have clear liquids up until 3 hours before your surgery.     Eat a light diet the day before surgery.  Examples including soups, broths, toast, yogurt, mashed potatoes.  AVOID GAS PRODUCING FOODS. Things to avoid include carbonated beverages (fizzy beverages, sodas), raw fruits and raw vegetables (uncooked), or beans.    If your bowels are filled with gas, your surgeon will have difficulty visualizing your pelvic organs which increases your surgical risks.   Your role in  recovery Your role is to become active as soon as directed by your doctor, while still giving yourself time to heal.  Rest when you feel tired. You will be asked to do the following in order to speed your recovery:   - Cough and breathe deeply. This helps to clear and expand your lungs and can prevent pneumonia after surgery.  - [place]. Do mild physical activity. Walking or moving your legs help your circulation and body functions return to normal. Do not try to get up or walk alone the first time after surgery.   -If you develop swelling on one leg or the other, pain in the back of your leg, redness/warmth in one of your legs, please call the office or go to the Emergency Room to have a doppler to rule out a blood clot. For shortness of breath, chest pain-seek care in the Emergency Room as soon as possible. - Actively manage your pain. Managing your pain lets you move in comfort. We will ask you to rate your pain on a scale of zero to 10. It is your responsibility to tell your doctor or nurse where and how much you hurt so your pain can be treated.   Special Considerations -If you are diabetic, you may be placed on insulin after surgery to have closer control over your blood sugars to promote healing and recovery.  This does not mean that you will be discharged on insulin.  If applicable, your oral antidiabetics will be resumed when you  are tolerating a solid diet.   -Your final pathology results from surgery should be available around one week after surgery and the results will be relayed to you when available.   -FMLA forms can be faxed to [phone] and please allow 5-7 business days for completion.   Pain Management After Surgery -You have been prescribed your pain medication and bowel regimen medications before surgery so that you can have these available when you are discharged from the hospital. The pain medication is for use ONLY AFTER surgery and a new prescription  will not be given.    -Make sure that you have Tylenol and Ibuprofen IF YOU ARE ABLE TO TAKE THESE MEDICATIONS at home to use on a regular basis after surgery for pain control. We recommend alternating the medications every hour to six hours since they work differently and are processed in the body differently for pain relief. YOUR CELEXA WITH IBUPROFEN USE CAN INCREASE YOUR CHANCE OF GASTROINTESTINAL BLEEDING SO LIMIT USE OF IBUPROFEN AND TAKE WITH FOOD.   -Review the attached handout on narcotic use and their risks and side effects.    Bowel Regimen -You have been prescribed Sennakot-S to take nightly to prevent constipation especially if you are taking the narcotic pain medication intermittently.  It is important to prevent constipation and drink adequate amounts of liquids. You can stop taking this medication when you are not taking pain medication and you are back on your normal bowel routine.   Risks of Surgery Risks of surgery are low but include bleeding, infection, damage to surrounding structures, re-operation, blood clots, and very rarely death.     Blood Transfusion Information (For the consent to be signed before surgery)   We will be checking your blood type before surgery so in case of emergencies, we will know what type of blood you would need.                                             WHAT IS A BLOOD TRANSFUSION?   A transfusion is the replacement of blood or some of its parts. Blood is made up of multiple cells which provide different functions. Red blood cells carry oxygen and are used for blood loss replacement. White blood cells fight against infection. Platelets control bleeding. Plasma helps clot blood. Other blood products are available for specialized needs, such as hemophilia or other clotting disorders. BEFORE THE TRANSFUSION  Who gives blood for transfusions?  You may be able to donate blood to be used at a later date on yourself (autologous  donation). Relatives can be asked to donate blood. This is generally not any safer than if you have received blood from a stranger. The same precautions are taken to ensure safety when a relative's blood is donated. Healthy volunteers who are fully evaluated to make sure their blood is safe. This is blood bank blood. Transfusion therapy is the safest it has ever been in the practice of medicine. Before blood is taken from a donor, a complete history is taken to make sure that person has no history of diseases nor engages in risky social behavior (examples are intravenous drug use or sexual activity with multiple partners). The donor's travel history is screened to minimize risk of transmitting infections, such as malaria. The donated blood is tested for signs of infectious diseases, such as HIV and hepatitis. The blood  is then tested to be sure it is compatible with you in order to minimize the chance of a transfusion reaction. If you or a relative donates blood, this is often done in anticipation of surgery and is not appropriate for emergency situations. It takes many days to process the donated blood. RISKS AND COMPLICATIONS Although transfusion therapy is very safe and saves many lives, the main dangers of transfusion include:  Getting an infectious disease. Developing a transfusion reaction. This is an allergic reaction to something in the blood you were given. Every precaution is taken to prevent this. The decision to have a blood transfusion has been considered carefully by your caregiver before blood is given. Blood is not given unless the benefits outweigh the risks.   AFTER SURGERY INSTRUCTIONS   Return to work: 4-6 weeks if applicable   Activity: 1. Be up and out of the bed during the day.  Take a nap if needed.  You may walk up steps but be careful and use the hand rail.  Stair climbing will tire you more than you think, you may need to stop part way and rest.    2. No lifting or  straining for 6 weeks over 10 pounds. No pushing, pulling, straining for 6 weeks.   3. No driving for 1 week(s).  Do not drive if you are taking narcotic pain medicine and make sure that your reaction time has returned.    4. You can shower as soon as the next day after surgery. Shower daily.  Use your regular soap and water (not directly on the incision) and pat your incision(s) dry afterwards; don't rub.  No tub baths or submerging your body in water until cleared by your surgeon. If you have the soap that was given to you by pre-surgical testing that was used before surgery, you do not need to use it afterwards because this can irritate your incisions.    5. No sexual activity and nothing in the vagina for 4 weeks, 8 weeks if you have a hysterectomy.   6. You may experience a small amount of clear drainage from your incisions, which is normal.  If the drainage persists, increases, or changes color please call the office.   7. Do not use creams, lotions, or ointments such as neosporin on your incisions after surgery until advised by your surgeon because they can cause removal of the dermabond glue on your incisions.     8. You may experience vaginal spotting after surgery or around the 6-8 week mark from surgery when the stitches at the top of the vagina begin to dissolve (if you have a hysterectomy).  The spotting is normal but if you experience heavy bleeding, call our office.   9. Take Tylenol or ibuprofen first for pain if you are able to take these medications and only use narcotic pain medication for severe pain not relieved by the Tylenol or Ibuprofen.  Monitor your Tylenol intake to a max of 4,000 mg in a 24 hour period. You can alternate these medications after surgery.   Diet: 1. Low sodium Heart Healthy Diet is recommended but you are cleared to resume your normal (before surgery) diet after your procedure.   2. It is safe to use a laxative, such as Miralax or Colace, if you have  difficulty moving your bowels. You have been prescribed Sennakot-S to take at bedtime every evening after surgery to keep bowel movements regular and to prevent constipation.     Wound  Care: 1. Keep clean and dry.  Shower daily.   Reasons to call the Doctor: Fever - Oral temperature greater than 100.4 degrees Fahrenheit Foul-smelling vaginal discharge Difficulty urinating Nausea and vomiting Increased pain at the site of the incision that is unrelieved with pain medicine. Difficulty breathing with or without chest pain New calf pain especially if only on one side Sudden, continuing increased vaginal bleeding with or without clots.   Contacts: For questions or concerns you should contact:   Dr. Jeral Pinch at [phone]   Joylene John, NP at [phone]   After Hours: call [phone] and have the GYN Oncologist paged/contacted (after 5 pm or on the weekends).   Messages sent via mychart are for non-urgent matters and are not responded to after hours so for urgent needs, please call the after hours number.

## 2022-03-22 ENCOUNTER — Other Ambulatory Visit (HOSPITAL_COMMUNITY): Payer: Self-pay

## 2022-03-26 ENCOUNTER — Telehealth: Payer: Self-pay

## 2022-03-26 NOTE — Telephone Encounter (Signed)
Per Joylene John NP, pt is aware of Dr. Geralyn Flash recommendations regarding Letrozole since pt is having surgery on 04/17/22.  Dr.Gudena recommends stopping medication 3 days before surgery and restarting 3 days post surgery.  Pt voiced an understanding.

## 2022-04-05 ENCOUNTER — Other Ambulatory Visit (HOSPITAL_COMMUNITY): Payer: Self-pay

## 2022-04-06 NOTE — Patient Instructions (Signed)
SURGICAL WAITING ROOM VISITATION Patients having surgery or a procedure may have no more than 2 support people in the waiting area - these visitors may rotate in the visitor waiting room.   Children under the age of 65 must have an adult with them who is not the patient. If the patient needs to stay at the hospital during part of their recovery, the visitor guidelines for inpatient rooms apply.  PRE-OP VISITATION  Pre-op nurse will coordinate an appropriate time for 1 support person to accompany the patient in pre-op.  This support person may not rotate.  This visitor will be contacted when the time is appropriate for the visitor to come back in the pre-op area.  Please refer to the [facility] website for the visitor guidelines for Inpatients (after your surgery is over and you are in a regular room).   You are not required to quarantine at this time prior to your surgery. However, you must do this: Hand Hygiene often Do NOT share personal items Notify your provider if you are in close contact with someone who has COVID or you develop fever 100.4 or greater, new onset of sneezing, cough, sore throat, shortness of breath or body aches.      Your procedure is scheduled on:  Wednesday  April 17, 2022  Report to [facility] Main Entrance.  Report to admitting at:  06:15   AM  +++++Call this number if you have any questions or problems the morning of surgery [phone]  ++ Eat a light diet the day before surgery.  Examples including soups, broths, toast, yogurt, mashed potatoes.  AVOID GAS PRODUCING FOODS. Things to avoid include carbonated beverages (fizzy beverages, sodas), raw fruits and raw vegetables (uncooked), or beans.  Do not eat any food After Midnight the night before surgery  After Midnight you may have the following liquids until   05:30 AM DAY OF SURGERY   Clear Liquid Diet Water Black Coffee (sugar ok, NO MILK/CREAM OR CREAMERS)  Tea (sugar ok, NO MILK/CREAM  OR CREAMERS) regular and decaf                             Plain Jell-O (NO RED)                                           Fruit ices (not with fruit pulp, NO RED)                                     Popsicles (NO RED)                                                                  Juice: apple, WHITE grape, WHITE cranberry Sports drinks like Gatorade (NO RED)               FOLLOW ANY ADDITIONAL PRE OP INSTRUCTIONS YOU RECEIVED FROM YOUR SURGEON'S OFFICE!!!  Stop taking your Letrozole (Femara) 3 days before surgery and them resume taking it 3 days after your surgery.  Take ONLY these medicines the morning of surgery with A SIP OF WATER: citalopram (Celexa), carvedilol (Coreg), Pantoprazole (Protonix). If needed you may take the alprazolam (Xanax).  Oral Hygiene is also important to reduce your risk of infection.        Remember - BRUSH YOUR TEETH THE MORNING OF SURGERY WITH YOUR REGULAR TOOTHPASTE   Bring CPAP mask and tubing day of surgery.                   You may not have any metal on your body including hair pins, jewelry, and body piercing  Do not wear make-up, lotions, powders, perfumes or deodorant  Do not wear nail polish including gel and S&S, artificial / acrylic nails, or any other type of covering on natural nails including finger and toenails. If you have artificial nails, gel coating, etc., that needs to be removed by a nail salon, Please have this removed prior to surgery. Not doing so may mean that your surgery could be cancelled or delayed if the Surgeon or anesthesia staff feels like they are unable to monitor you safely.   Do not shave 48 hours prior to surgery to avoid nicks in your skin which may contribute to postoperative infections.   Contacts, Hearing Aids, dentures or bridgework may not be worn into surgery.   DO NOT [place]. PHARMACY WILL DISPENSE MEDICATIONS LISTED ON YOUR MEDICATION LIST TO YOU DURING YOUR ADMISSION [place]!   Patients discharged on the day of surgery will not be allowed to drive home.  Someone NEEDS to stay with you for the first 24 hours after anesthesia.  Special Instructions: Bring a copy of your healthcare power of attorney and living will documents the day of surgery, if you wish to have them scanned into your [place] [facility] Records- EPIC  Please read over the following fact sheets you were given: IF YOU HAVE QUESTIONS ABOUT YOUR PRE-OP INSTRUCTIONS, PLEASE CALL [phone]  ([place])   [place] - Preparing for Surgery Before surgery, you can play an important role.  Because skin is not sterile, your skin needs to be as free of germs as possible.  You can reduce the number of germs on your skin by washing with CHG (chlorahexidine gluconate) soap before surgery.  CHG is an antiseptic cleaner which kills germs and bonds with the skin to continue killing germs even after washing. Please DO NOT use if you have an allergy to CHG or antibacterial soaps.  If your skin becomes reddened/irritated stop using the CHG and inform your nurse when you arrive at Short Stay. Do not shave (including legs and underarms) for at least 48 hours prior to the first CHG shower.  You may shave your face/neck.  Please follow these instructions carefully:  1.  Shower with CHG Soap the night before surgery and the  morning of surgery.  2.  If you choose to wash your hair, wash your hair first as usual with your normal  shampoo.  3.  After you shampoo, rinse your hair and body thoroughly to remove the shampoo.                             4.  Use CHG as you would any other liquid soap.  You can apply chg directly to the skin and wash.  Gently with a scrungie or clean washcloth.  5.  Apply the CHG  Soap to your body ONLY FROM THE NECK DOWN.   Do not use on face/ open                           Wound or open sores. Avoid contact with eyes, ears mouth and genitals (private parts).                       Wash  face,  Genitals (private parts) with your normal soap.             6.  Wash thoroughly, paying special attention to the area where your  surgery  will be performed.  7.  Thoroughly rinse your body with warm water from the neck down.  8.  DO NOT shower/wash with your normal soap after using and rinsing off the CHG Soap.            9.  Pat yourself dry with a clean towel.            10.  Wear clean pajamas.            11.  Place clean sheets on your bed the night of your first shower and do not  sleep with pets.  ON THE DAY OF SURGERY : Do not apply any lotions/deodorants the morning of surgery.  Please wear clean clothes to the hospital/surgery center.    FAILURE TO FOLLOW THESE INSTRUCTIONS MAY RESULT IN THE CANCELLATION OF YOUR SURGERY  PATIENT SIGNATURE_________________________________  NURSE SIGNATURE__________________________________  ________________________________________________________________________

## 2022-04-06 NOTE — Progress Notes (Signed)
COVID Vaccine received:  '[]'$  No '[x]'$  Yes Date of any COVID positive Test in last 90 days:  PCP - Seward Carol, MD Cardiologist - Dr. Pierre Bali Oncology - Dr. Nicholas Lose  Chest x-ray - 10-16-20  Epic EKG -  12-26-21  epic Stress Test -  ECHO -10-04-21  epic  Cardiac Cath -   Pacemaker/ICD device     '[x]'$  N/A Spinal Cord Stimulator:'[x]'$  No '[]'$  Yes      (Remind patient to bring remote DOS) Other Implants:   Bowel Prep - Light diet, non-gassy foods,   History of Sleep Apnea? '[]'$  No '[x]'$  Yes   Sleep Study Date:  05-21-21   CPAP used?- '[]'$  No '[x]'$  Yes  (Instruct to bring their mask & Tubing)  Does the patient monitor blood sugar? '[]'$  No '[]'$  Yes  '[]'$  N/A Does patient have a [employer] or Dexacom? '[]'$  No '[]'$  Yes   Fasting Blood Sugar Ranges-  Checks Blood Sugar _____ times a day  Blood Thinner Instructions: Aspirin Instructions: Last Dose:  ERAS Protocol Ordered: '[]'$  No  '[]'$  Yes PRE-SURGERY '[]'$  ENSURE  '[]'$  G2   Comments: Patient to hold Letrozole (Femara) x 3 day prior and resume 3 days postop   Activity level: Patient can / can not climb a flight of stairs without difficulty;  '[]'$  No CP  '[]'$  No SOB,  but would have ______   Anesthesia review: CHF, Hx Palps, Pre-DM, HTN   Blood consent has been ordered.  Patient denies shortness of breath, fever, cough and chest pain at PAT appointment.  Patient verbalized understanding and agreement to the Pre-Surgical Instructions that were given to them at this PAT appointment. Patient was also educated of the need to review these PAT instructions again prior to his/her surgery.I reviewed the appropriate phone numbers to call if they have any and questions or concerns.

## 2022-04-08 ENCOUNTER — Ambulatory Visit: Payer: No Typology Code available for payment source | Attending: General Surgery

## 2022-04-08 VITALS — Wt 252.4 lb

## 2022-04-08 DIAGNOSIS — Z483 Aftercare following surgery for neoplasm: Secondary | ICD-10-CM | POA: Insufficient documentation

## 2022-04-08 NOTE — Therapy (Signed)
  OUTPATIENT PHYSICAL THERAPY SOZO SCREENING NOTE   Patient Name: Grace Pennington MRN: [id number] DOB:01/08/1971, 51 y.o., female Today's Date: 04/08/2022  PCP: Seward Carol, MD REFERRING PROVIDER: [name], MD   PT End of Session - 04/08/22 0827     Visit Number 7   # unchanged due to screen only   PT Start Time 0825    PT Stop Time 0830    PT Time Calculation (min) 5 min    Activity Tolerance Patient tolerated treatment well    Behavior During Therapy [facility] for tasks assessed/performed             Past Medical History:  Diagnosis Date   Anxiety    Breast cancer in female [facility])    Right   Depression    Diabetes mellitus without complication ([place])    metformin   GERD (gastroesophageal reflux disease)    Hypertension    Palpitations    Port-A-Cath in place 11/20/2020   Sleep apnea    uses CPAP   Past Surgical History:  Procedure Laterality Date   BREAST LUMPECTOMY WITH RADIOACTIVE SEED AND SENTINEL LYMPH NODE BIOPSY Right 10/16/2020   Procedure: RIGHT BREAST LUMPECTOMY WITH RADIOACTIVE SEED AND SENTINEL LYMPH NODE BIOPSY;  Surgeon: [name], MD;  Location: [place];  Service: General;  Laterality: Right;   PORT-A-CATH REMOVAL N/A 12/28/2021   Procedure: REMOVAL PORT-A-CATH;  Surgeon: [name], MD;  Location: [place];  Service: General;  Laterality: N/A;   PORTACATH PLACEMENT Left 10/16/2020   Procedure: INSERTION PORT-A-CATH;  Surgeon: [name], MD;  Location: [place];  Service: General;  Laterality: Left;   TUBAL LIGATION     Patient Active Problem List   Diagnosis Date Noted   Malignant neoplasm of upper-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, estrogen receptor positive ([place]) 09/20/2020   Precordial pain 04/14/2014   Essential hypertension, benign 08/03/2013   Family history of ischemic heart disease 08/03/2013    REFERRING DIAG: right breast cancer at risk for lymphedema  THERAPY DIAG: Aftercare following surgery for  neoplasm  PERTINENT HISTORY: Rt lumpectomy and SLNB on 10/16/20 with Dr. [name] 0/4 nodes positive due to ER+ breast cancer with chemotherapy and radiation, completed radiation on 9/7,  HTN   PRECAUTIONS: right UE Lymphedema risk, None  SUBJECTIVE: Pt returns for her 3 month L-Dex screen.   PAIN:  Are you having pain? No  SOZO SCREENING: Patient was assessed today using the SOZO machine to determine the lymphedema index score. This was compared to her baseline score. It was determined that she is within the recommended range when compared to her baseline and no further action is needed at this time. She will continue SOZO screenings. These are done every 3 months for 2 years post operatively followed by every 6 months for 2 years, and then annually.    [name], PTA 04/08/2022, 8:33 AM

## 2022-04-09 ENCOUNTER — Other Ambulatory Visit: Payer: Self-pay

## 2022-04-09 ENCOUNTER — Encounter (HOSPITAL_COMMUNITY): Payer: Self-pay

## 2022-04-09 ENCOUNTER — Encounter (HOSPITAL_COMMUNITY)
Admission: RE | Admit: 2022-04-09 | Discharge: 2022-04-09 | Disposition: A | Discharge status: Home / Self Care | Payer: No Typology Code available for payment source | Source: Ambulatory Visit | Attending: Gynecologic Oncology | Admitting: Gynecologic Oncology

## 2022-04-09 VITALS — BP 113/91 | HR 84 | Temp 99.4°F | Resp 22 | Ht 66.0 in | Wt 250.0 lb

## 2022-04-09 DIAGNOSIS — Z1589 Genetic susceptibility to other disease: Secondary | ICD-10-CM | POA: Insufficient documentation

## 2022-04-09 DIAGNOSIS — R7303 Prediabetes: Secondary | ICD-10-CM | POA: Insufficient documentation

## 2022-04-09 DIAGNOSIS — C50211 Malignant neoplasm of upper-inner quadrant of right female breast: Secondary | ICD-10-CM | POA: Insufficient documentation

## 2022-04-09 DIAGNOSIS — Z170 Estrogen receptor positive status [ER+]: Secondary | ICD-10-CM | POA: Insufficient documentation

## 2022-04-09 DIAGNOSIS — Z01812 Encounter for preprocedural laboratory examination: Secondary | ICD-10-CM | POA: Diagnosis present

## 2022-04-09 DIAGNOSIS — Z1509 Genetic susceptibility to other malignant neoplasm: Secondary | ICD-10-CM

## 2022-04-09 DIAGNOSIS — Z1501 Genetic susceptibility to malignant neoplasm of breast: Secondary | ICD-10-CM | POA: Diagnosis not present

## 2022-04-09 DIAGNOSIS — I10 Essential (primary) hypertension: Secondary | ICD-10-CM

## 2022-04-09 HISTORY — DX: Prediabetes: R73.03

## 2022-04-09 LAB — CBC
HCT: 41.1 % (ref 36.0–46.0)
Hemoglobin: 13.4 g/dL (ref 12.0–15.0)
MCH: 28.4 pg (ref 26.0–34.0)
MCHC: 32.6 g/dL (ref 30.0–36.0)
MCV: 87.1 fL (ref 80.0–100.0)
Platelets: 299 10*3/uL (ref 150–400)
RBC: 4.72 MIL/uL (ref 3.87–5.11)
RDW: 15.3 % (ref 11.5–15.5)
WBC: 5.4 10*3/uL (ref 4.0–10.5)
nRBC: 0 % (ref 0.0–0.2)

## 2022-04-09 LAB — GLUCOSE, CAPILLARY: Glucose-Capillary: 138 mg/dL — ABNORMAL HIGH (ref 70–99)

## 2022-04-09 LAB — COMPREHENSIVE METABOLIC PANEL
ALT: 24 U/L (ref 0–44)
AST: 21 U/L (ref 15–41)
Albumin: 3.9 g/dL (ref 3.5–5.0)
Alkaline Phosphatase: 50 U/L (ref 38–126)
Anion gap: 6 (ref 5–15)
BUN: 15 mg/dL (ref 6–20)
CO2: 26 mmol/L (ref 22–32)
Calcium: 9.7 mg/dL (ref 8.9–10.3)
Chloride: 108 mmol/L (ref 98–111)
Creatinine, Ser: 0.86 mg/dL (ref 0.44–1.00)
GFR, Estimated: >60 mL/min (ref >60)
Glucose, Bld: 128 mg/dL — ABNORMAL HIGH (ref 70–99)
Potassium: 3.8 mmol/L (ref 3.5–5.1)
Sodium: 140 mmol/L (ref 135–145)
Total Bilirubin: 0.5 mg/dL (ref 0.3–1.2)
Total Protein: 8.3 g/dL — ABNORMAL HIGH (ref 6.5–8.1)

## 2022-04-09 LAB — HEMOGLOBIN A1C
Hgb A1c MFr Bld: 6.4 % — ABNORMAL HIGH (ref 4.8–5.6)
Mean Plasma Glucose: 136.98 mg/dL

## 2022-04-11 ENCOUNTER — Ambulatory Visit (HOSPITAL_BASED_OUTPATIENT_CLINIC_OR_DEPARTMENT_OTHER)
Admission: RE | Admit: 2022-04-11 | Discharge: 2022-04-11 | Disposition: A | Discharge status: Home / Self Care | Payer: No Typology Code available for payment source | Source: Ambulatory Visit | Attending: Internal Medicine | Admitting: Internal Medicine

## 2022-04-11 ENCOUNTER — Ambulatory Visit (HOSPITAL_COMMUNITY)
Admission: RE | Admit: 2022-04-11 | Discharge: 2022-04-11 | Disposition: A | Discharge status: Home / Self Care | Payer: No Typology Code available for payment source | Source: Ambulatory Visit | Attending: Internal Medicine | Admitting: Internal Medicine

## 2022-04-11 VITALS — BP 126/80 | HR 75

## 2022-04-11 DIAGNOSIS — G4733 Obstructive sleep apnea (adult) (pediatric): Secondary | ICD-10-CM | POA: Diagnosis not present

## 2022-04-11 DIAGNOSIS — Z79899 Other long term (current) drug therapy: Secondary | ICD-10-CM | POA: Insufficient documentation

## 2022-04-11 DIAGNOSIS — T451X5A Adverse effect of antineoplastic and immunosuppressive drugs, initial encounter: Secondary | ICD-10-CM | POA: Diagnosis not present

## 2022-04-11 DIAGNOSIS — C50211 Malignant neoplasm of upper-inner quadrant of right female breast: Secondary | ICD-10-CM | POA: Diagnosis not present

## 2022-04-11 DIAGNOSIS — I5022 Chronic systolic (congestive) heart failure: Secondary | ICD-10-CM | POA: Diagnosis not present

## 2022-04-11 DIAGNOSIS — I10 Essential (primary) hypertension: Secondary | ICD-10-CM | POA: Insufficient documentation

## 2022-04-11 DIAGNOSIS — F32A Depression, unspecified: Secondary | ICD-10-CM | POA: Insufficient documentation

## 2022-04-11 DIAGNOSIS — I427 Cardiomyopathy due to drug and external agent: Secondary | ICD-10-CM | POA: Insufficient documentation

## 2022-04-11 DIAGNOSIS — Z923 Personal history of irradiation: Secondary | ICD-10-CM | POA: Diagnosis not present

## 2022-04-11 DIAGNOSIS — Z0189 Encounter for other specified special examinations: Secondary | ICD-10-CM | POA: Diagnosis not present

## 2022-04-11 DIAGNOSIS — Z170 Estrogen receptor positive status [ER+]: Secondary | ICD-10-CM

## 2022-04-11 DIAGNOSIS — F419 Anxiety disorder, unspecified: Secondary | ICD-10-CM | POA: Diagnosis not present

## 2022-04-11 LAB — BASIC METABOLIC PANEL
Anion gap: 8 (ref 5–15)
BUN: 15 mg/dL (ref 6–20)
CO2: 26 mmol/L (ref 22–32)
Calcium: 9.6 mg/dL (ref 8.9–10.3)
Chloride: 105 mmol/L (ref 98–111)
Creatinine, Ser: 0.98 mg/dL (ref 0.44–1.00)
GFR, Estimated: >60 mL/min (ref >60)
Glucose, Bld: 104 mg/dL — ABNORMAL HIGH (ref 70–99)
Potassium: 3.9 mmol/L (ref 3.5–5.1)
Sodium: 139 mmol/L (ref 135–145)

## 2022-04-11 LAB — ECHOCARDIOGRAM COMPLETE
Area-P 1/2: 2.83 cm2
S' Lateral: 3.3 cm

## 2022-04-11 MED ORDER — LOSARTAN POTASSIUM 50 MG PO TABS: 50.0000 mg | ORAL_TABLET | Freq: Every day | ORAL | 3 refills | Status: DC

## 2022-04-11 MED ORDER — SPIRONOLACTONE 50 MG PO TABS: 50.0000 mg | ORAL_TABLET | Freq: Every day | ORAL | 11 refills | Status: DC

## 2022-04-11 NOTE — Progress Notes (Addendum)
CARDIO-ONCOLOGY CLINIC NOTE  Referring Physician:Gudena, Vinay Primary Care:Polite,  Jori Moll Primary Cardiologist:Jene Huq, Quillian Quince  HPI:  Grace Pennington is 51 y.o. female with HTN, OSA, Depression, Anxiety,  Breast Cancer referred by Dr. Nicholas Lose for enrollment into the Cardio-Oncology program.  09/08/2020: Stage IA (cT1b, cN0, cM0, G2, ER+, PR+, HER2+).  10/16/20 right lumpectomy high grade DCIS, clear margins, 4 right axillary lymph nodes negative for carcinoma.  Started on BREAST PACLITAXEL + TRASTUZUMAB Q7D / TRASTUZUMAB Q21D which she finished. She started on herceptin maintenance which will be completed 05/23.  Adjuvant radiation completed 09/22.  Also will be started on antiestrogen therapy.    10/2020 ECHO EF 60-65%, GLS -24.8% 01/2021 ECHO EF 50-55% abnormal septal motion, GLS -18.9% partially brought down by apical 3C view, ? poor visualization of lateral wall.  Maybe subtle decrease in EF/increase in GLS but not clinically significant 04/03/2021 ECHO EF 45-50%, GLS ~ -13% mild worsening in EF - >Herceptin held 9/22 due to dropping EF. Losartan added to carvedilol.   Echo 05/17/21 EF 55% G1DD GLS -16.1% GLS underestimated due to poor endocardial trackin on 3 and 4-chamber images. 2-chamber image looks more accurate at -18.9 -> Restarted herceptin 10/22.   Sleep study 10/22 with evidence of moderate OSA.  Echo 07/20/21 EF 50-55% GLS -16.4% -> Herceptin continued. Losartan increased  Echo 10/04/21: EF 50-55% GLS -17.0 Echo 04/11/22: EF 50-55% GLS -17.0  Finished Herceptin 5/23. Here today for f/u with echo. Feels good. Wearing CPAP.    Past Medical History:  Diagnosis Date   Anxiety    Breast cancer in female [facility])    Right   Depression    GERD (gastroesophageal reflux disease)    Hypertension    Palpitations    Port-A-Cath in place 11/20/2020   Pre-diabetes    Sleep apnea    uses CPAP    Current Outpatient Medications  Medication Sig Dispense Refill   ALPRAZolam  (XANAX) 0.25 MG tablet Take 1 tablet by mouth once a day if needed 30 tablet 3   carvedilol (COREG) 3.125 MG tablet Take 1 tablet by mouth 2 times daily. 60 tablet 3   citalopram (CELEXA) 10 MG tablet TAKE 1 TABLET BY MOUTH ONCE DAILY 90 tablet 3   hydrochlorothiazide (HYDRODIURIL) 25 MG tablet TAKE 1 TABLET BY MOUTH ONCE DAILY 90 tablet 3   letrozole (FEMARA) 2.5 MG tablet Take 1 tablet by mouth daily. 90 tablet 3   metFORMIN (GLUCOPHAGE) 1000 MG tablet Take 1,000 mg by mouth 2 (two) times daily with a meal.     Multiple Vitamins-Minerals (MULTIVITAMIN WITH MINERALS) tablet Take 1 tablet by mouth daily.     NON FORMULARY Pt uses a cpap nightly     oxyCODONE (OXY IR/ROXICODONE) 5 MG immediate release tablet Take 1 tablet by mouth every 4 hours as needed for severe pain. For AFTER surgery only, do not take and drive 10 tablet 0   pantoprazole (PROTONIX) 40 MG tablet Take 1 tablet by mouth once daily. 30 tablet 11   rosuvastatin (CRESTOR) 10 MG tablet Take 1 tablet by mouth once a day 90 tablet 3   spironolactone (ALDACTONE) 50 MG tablet Take 1 tablet by mouth twice a day 60 tablet 11   senna-docusate (SENOKOT-S) 8.6-50 MG tablet Take 2 tablets by mouth at bedtime. For AFTER surgery, do not take if having diarrhea (Patient not taking: Reported on 04/11/2022) 30 tablet 0   No current facility-administered medications for this encounter.    No Known Allergies  Social History   Socioeconomic History   Marital status: Single    Spouse name: Not on file   Number of children: 2   Years of education: Not on file   Highest education level: Not on file  Occupational History   Occupation: medical records    Employer: [employer]  Tobacco Use   Smoking status: Former    Types: Cigarettes    Quit date: 07/29/1996    Years since quitting: 25.7   Smokeless tobacco: Never  Vaping Use   Vaping Use: Never used  Substance and Sexual Activity   Alcohol use: Yes    Comment: socially   Drug use: No    Sexual activity: Not Currently    Birth control/protection: Surgical  Other Topics Concern   Not on file  Social History Narrative   Not on file   Social Determinants of Health   Financial Resource Strain: Not on file  Food Insecurity: Not on file  Transportation Needs: Not on file  Physical Activity: Not on file  Stress: Not on file  Social Connections: Not on file  Intimate Partner Violence: Not At Risk (01/24/2021)   Humiliation, Afraid, Rape, and Kick questionnaire    Fear of Current or Ex-Partner: No    Emotionally Abused: No    Physically Abused: No    Sexually Abused: No      Family History  Problem Relation Age of Onset   Heart disease Mother    Diabetes Mother    Hyperlipidemia Mother    Hypertension Mother    Kidney failure Mother    Breast cancer Mother    Sleep apnea Father    Diabetes Father    Hyperlipidemia Father    Hypertension Father    Prostate cancer Father    Diabetes Other    Stroke Other    Hypertension Other    Kidney failure Other    Coronary artery disease Other    Colon cancer Neg Hx    Ovarian cancer Neg Hx    Endometrial cancer Neg Hx    Pancreatic cancer Neg Hx     Vitals:   04/11/22 0933  BP: 126/80  Pulse: 75  SpO2: 97%      PHYSICAL EXAM: General:  Well appearing. No resp difficulty HEENT: normal Neck: supple. no JVD. Carotids 2+ bilat; no bruits. No lymphadenopathy or thryomegaly appreciated. Cor: PMI nondisplaced. Regular rate & rhythm. No rubs, gallops or murmurs. Lungs: clear Abdomen: obese soft, nontender, nondistended. No hepatosplenomegaly. No bruits or masses. Good bowel sounds. Extremities: no cyanosis, clubbing, rash, edema Neuro: alert & orientedx3, cranial nerves grossly intact. moves all 4 extremities w/o difficulty. Affect pleasant   ASSESSMENT & PLAN:  1. R Breast Cancer c/p chemo-induced cardiomyopathy (Herceptin) - 08/2020 diagnosed with Stage IA (cT1b, cN0, cM0, G2, ER+, PR+, HER2+) R Breast  Cancer -10/2020 ECHO EF 60-65%, GLS -24.8% -01/2021 ECHO EF 50-55% abnormal septal motion, GLS -18.9% partially brought down by apical 3C view, ? poor visualization of lateral wall.  Maybe subtle decrease in EF/increase in GLS but not clinically significant -04/03/2021 ECHO EF 45-50%, GLS ~ -13% mild worsening in EF  -EF improved with holding Herceptin -Echo 05/17/21 EF 55% G1DD GLS -16.1% GLS underestimated due to poor endocardial tracking on 3 and 4-chamber images. 2-chamber image looks more accurate at -18.9 -Back on herceptin after echo 10/22 - Echo12/23/22 EF 50-55% GLS -16.4% - Echo 9/223 EF 50-55% GLS -17.0 - Echo today 04/11/22: EF 50-55% GLS -17.0 -  EF stable Herceptin finished 12/14/21 - Continue losartan, spiro and carvedilol.  - Herceptin finished 5/23 . EF remains on the low end of normal. Will check cMRI to clearly establish EF and look for any underlying cardiomyopathic process. Continue current regimen  2. HTN - Blood pressure well controlled. Continue current regimen.  3. OSA - Continue CPAP     Glori Bickers, MD  9:33 AM

## 2022-04-11 NOTE — Patient Instructions (Signed)
Labs done today, your results will be available in MyChart, we will contact you for abnormal readings.  Your physician has requested that you have a cardiac MRI. Cardiac MRI uses a computer to create images of your heart as its beating, producing both still and moving pictures of your heart and major blood vessels. For further information please visit [email or web address] Please follow the instruction sheet given to you today for more information. ONCE APPROVED BY INSURANCE RADIOLOGY WILL CALL YOU TO SCHEDULE THIS  Your physician recommends that you schedule a follow-up appointment in: 6 months (March 2024), **PLEASE CALL OUR OFFICE IN [place] TO SCHEDULE THIS APPOINTMENT  If you have any questions or concerns before your next appointment please send [place] a message through [place] or call our office at [phone].    TO LEAVE A MESSAGE FOR THE NURSE SELECT OPTION 2, PLEASE LEAVE A MESSAGE INCLUDING: YOUR NAME DATE OF BIRTH CALL BACK NUMBER REASON FOR CALL**this is important as we prioritize the call backs  YOU WILL RECEIVE A CALL BACK THE SAME DAY AS LONG AS YOU CALL BEFORE 4:00 PM  At the [facility], you and your health needs are our priority. As part of our continuing mission to provide you with exceptional heart care, we have created designated Provider Care Teams. These Care Teams include your primary Cardiologist (physician) and Advanced Practice Providers (APPs- Physician Assistants and Nurse Practitioners) who all work together to provide you with the care you need, when you need it.   You may see any of the following providers on your designated Care Team at your next follow up: Dr Glori Bickers Dr Loralie Champagne Dr. Roxana Hires, NP Lyda Jester, [place] [facility] [place], [place] Forestine Na, NP Audry Riles, PharmD   Please be sure to bring in all your medications bottles to every appointment.

## 2022-04-11 NOTE — Progress Notes (Signed)
  Echocardiogram 2D Echocardiogram has been performed.  Grace Pennington 04/11/2022, 8:49 AM

## 2022-04-16 ENCOUNTER — Telehealth: Payer: Self-pay

## 2022-04-16 NOTE — Telephone Encounter (Signed)
Telephone call to check on pre-operative status.  Patient compliant with pre-operative instructions.  Reinforced nothing to eat after midnight. Clear liquids until 0530. Patient to arrive at [facility].  No questions or concerns voiced.  Instructed to call for any needs.   Pt also stated her last dose of the Letrozole was Saturday 9/16

## 2022-04-17 ENCOUNTER — Ambulatory Visit (HOSPITAL_COMMUNITY)
Admission: RE | Admit: 2022-04-17 | Discharge: 2022-04-17 | Disposition: A | Discharge status: Home / Self Care | Payer: No Typology Code available for payment source | Attending: Gynecologic Oncology | Admitting: Gynecologic Oncology

## 2022-04-17 ENCOUNTER — Ambulatory Visit (HOSPITAL_COMMUNITY): Payer: No Typology Code available for payment source | Admitting: Physician Assistant

## 2022-04-17 ENCOUNTER — Encounter (HOSPITAL_COMMUNITY)
Admission: RE | Disposition: A | Discharge status: Home / Self Care | Payer: Self-pay | Source: Home / Self Care | Attending: Gynecologic Oncology

## 2022-04-17 ENCOUNTER — Ambulatory Visit (HOSPITAL_BASED_OUTPATIENT_CLINIC_OR_DEPARTMENT_OTHER): Payer: No Typology Code available for payment source | Admitting: Certified Registered Nurse Anesthetist

## 2022-04-17 ENCOUNTER — Encounter (HOSPITAL_COMMUNITY): Payer: Self-pay | Admitting: Gynecologic Oncology

## 2022-04-17 ENCOUNTER — Other Ambulatory Visit: Payer: Self-pay

## 2022-04-17 DIAGNOSIS — Z1501 Genetic susceptibility to malignant neoplasm of breast: Secondary | ICD-10-CM | POA: Diagnosis not present

## 2022-04-17 DIAGNOSIS — Z9221 Personal history of antineoplastic chemotherapy: Secondary | ICD-10-CM | POA: Diagnosis not present

## 2022-04-17 DIAGNOSIS — C50211 Malignant neoplasm of upper-inner quadrant of right female breast: Secondary | ICD-10-CM | POA: Insufficient documentation

## 2022-04-17 DIAGNOSIS — Z1589 Genetic susceptibility to other disease: Secondary | ICD-10-CM | POA: Diagnosis not present

## 2022-04-17 DIAGNOSIS — Z6841 Body Mass Index (BMI) 40.0 and over, adult: Secondary | ICD-10-CM | POA: Insufficient documentation

## 2022-04-17 DIAGNOSIS — Z87891 Personal history of nicotine dependence: Secondary | ICD-10-CM | POA: Insufficient documentation

## 2022-04-17 DIAGNOSIS — I10 Essential (primary) hypertension: Secondary | ICD-10-CM | POA: Insufficient documentation

## 2022-04-17 DIAGNOSIS — K219 Gastro-esophageal reflux disease without esophagitis: Secondary | ICD-10-CM | POA: Diagnosis not present

## 2022-04-17 DIAGNOSIS — C50919 Malignant neoplasm of unspecified site of unspecified female breast: Secondary | ICD-10-CM

## 2022-04-17 DIAGNOSIS — Z1509 Genetic susceptibility to other malignant neoplasm: Secondary | ICD-10-CM

## 2022-04-17 DIAGNOSIS — Z170 Estrogen receptor positive status [ER+]: Secondary | ICD-10-CM | POA: Diagnosis not present

## 2022-04-17 DIAGNOSIS — R7303 Prediabetes: Secondary | ICD-10-CM

## 2022-04-17 DIAGNOSIS — G4730 Sleep apnea, unspecified: Secondary | ICD-10-CM | POA: Insufficient documentation

## 2022-04-17 DIAGNOSIS — D259 Leiomyoma of uterus, unspecified: Secondary | ICD-10-CM | POA: Diagnosis not present

## 2022-04-17 DIAGNOSIS — Z923 Personal history of irradiation: Secondary | ICD-10-CM | POA: Diagnosis not present

## 2022-04-17 HISTORY — PX: ROBOTIC ASSISTED BILATERAL SALPINGO OOPHERECTOMY: SHX6078

## 2022-04-17 LAB — TYPE AND SCREEN
ABO/RH(D): O POS
Antibody Screen: NEGATIVE

## 2022-04-17 LAB — ABO/RH: ABO/RH(D): O POS

## 2022-04-17 LAB — GLUCOSE, CAPILLARY: Glucose-Capillary: 125 mg/dL — ABNORMAL HIGH (ref 70–99)

## 2022-04-17 SURGERY — SALPINGO-OOPHORECTOMY, BILATERAL, ROBOT-ASSISTED
Anesthesia: General | Site: Pelvis | Laterality: Bilateral

## 2022-04-17 MED ORDER — LIDOCAINE HCL (PF) 2 % IJ SOLN
INTRAMUSCULAR | Status: AC
  Filled 2022-04-17: qty 5

## 2022-04-17 MED ORDER — DROPERIDOL 2.5 MG/ML IJ SOLN
INTRAMUSCULAR | Status: AC
  Filled 2022-04-17: qty 2

## 2022-04-17 MED ORDER — FENTANYL CITRATE (PF) 250 MCG/5ML IJ SOLN
INTRAMUSCULAR | Status: AC
  Filled 2022-04-17: qty 5

## 2022-04-17 MED ORDER — STERILE WATER FOR IRRIGATION IR SOLN
Status: DC | PRN
  Administered 2022-04-17: 1000 mL

## 2022-04-17 MED ORDER — PHENYLEPHRINE 80 MCG/ML (10ML) SYRINGE FOR IV PUSH (FOR BLOOD PRESSURE SUPPORT)
PREFILLED_SYRINGE | INTRAVENOUS | Status: DC | PRN
  Administered 2022-04-17: 80 ug via INTRAVENOUS

## 2022-04-17 MED ORDER — FENTANYL CITRATE (PF) 100 MCG/2ML IJ SOLN
INTRAMUSCULAR | Status: DC | PRN
  Administered 2022-04-17: 50 ug via INTRAVENOUS

## 2022-04-17 MED ORDER — OXYCODONE HCL 5 MG PO TABS
ORAL_TABLET | ORAL | Status: AC
  Filled 2022-04-17: qty 1

## 2022-04-17 MED ORDER — LACTATED RINGERS IR SOLN
Status: DC | PRN
  Administered 2022-04-17: 1000 mL

## 2022-04-17 MED ORDER — OXYCODONE HCL 5 MG/5ML PO SOLN: 5.0000 mg | Freq: Once | ORAL | Status: AC | PRN

## 2022-04-17 MED ORDER — ONDANSETRON HCL 4 MG/2ML IJ SOLN: 4.0000 mg | Freq: Once | INTRAMUSCULAR | Status: DC | PRN

## 2022-04-17 MED ORDER — FENTANYL CITRATE PF 50 MCG/ML IJ SOSY
25.0000 ug | PREFILLED_SYRINGE | INTRAMUSCULAR | Status: DC | PRN
  Administered 2022-04-17: 50 ug via INTRAVENOUS

## 2022-04-17 MED ORDER — KETOROLAC TROMETHAMINE 30 MG/ML IJ SOLN: 30.0000 mg | Freq: Once | INTRAMUSCULAR | Status: DC | PRN

## 2022-04-17 MED ORDER — CHLORHEXIDINE GLUCONATE 0.12 % MT SOLN
15.0000 mL | Freq: Once | OROMUCOSAL | Status: AC
  Administered 2022-04-17: 15 mL via OROMUCOSAL

## 2022-04-17 MED ORDER — LIDOCAINE 2% (20 MG/ML) 5 ML SYRINGE
INTRAMUSCULAR | Status: DC | PRN
  Administered 2022-04-17: 100 mg via INTRAVENOUS

## 2022-04-17 MED ORDER — KETAMINE HCL 50 MG/5ML IJ SOSY
PREFILLED_SYRINGE | INTRAMUSCULAR | Status: AC
  Filled 2022-04-17: qty 5

## 2022-04-17 MED ORDER — KETOROLAC TROMETHAMINE 30 MG/ML IJ SOLN
INTRAMUSCULAR | Status: AC
  Filled 2022-04-17: qty 1

## 2022-04-17 MED ORDER — FENTANYL CITRATE PF 50 MCG/ML IJ SOSY
PREFILLED_SYRINGE | INTRAMUSCULAR | Status: AC
  Filled 2022-04-17: qty 1

## 2022-04-17 MED ORDER — KETOROLAC TROMETHAMINE 30 MG/ML IJ SOLN: INTRAMUSCULAR | Status: DC | PRN

## 2022-04-17 MED ORDER — LACTATED RINGERS IV SOLN: INTRAVENOUS | Status: DC

## 2022-04-17 MED ORDER — DIPHENHYDRAMINE HCL 50 MG/ML IJ SOLN
INTRAMUSCULAR | Status: AC
  Filled 2022-04-17: qty 1

## 2022-04-17 MED ORDER — KETOROLAC TROMETHAMINE 15 MG/ML IJ SOLN
15.0000 mg | INTRAMUSCULAR | Status: AC
  Administered 2022-04-17: 15 mg via INTRAVENOUS

## 2022-04-17 MED ORDER — ROCURONIUM BROMIDE 10 MG/ML (PF) SYRINGE
PREFILLED_SYRINGE | INTRAVENOUS | Status: DC | PRN
  Administered 2022-04-17: 100 mg via INTRAVENOUS

## 2022-04-17 MED ORDER — MIDAZOLAM HCL 2 MG/2ML IJ SOLN
INTRAMUSCULAR | Status: AC
  Filled 2022-04-17: qty 2

## 2022-04-17 MED ORDER — PROPOFOL 10 MG/ML IV BOLUS
INTRAVENOUS | Status: DC | PRN
  Administered 2022-04-17: 200 mg via INTRAVENOUS

## 2022-04-17 MED ORDER — DEXAMETHASONE SODIUM PHOSPHATE 10 MG/ML IJ SOLN
INTRAMUSCULAR | Status: AC
  Filled 2022-04-17: qty 1

## 2022-04-17 MED ORDER — OXYCODONE HCL 5 MG PO TABS
5.0000 mg | ORAL_TABLET | Freq: Once | ORAL | Status: AC | PRN
  Administered 2022-04-17: 5 mg via ORAL

## 2022-04-17 MED ORDER — SUGAMMADEX SODIUM 500 MG/5ML IV SOLN
INTRAVENOUS | Status: DC | PRN
  Administered 2022-04-17: 453.6 mg via INTRAVENOUS

## 2022-04-17 MED ORDER — GABAPENTIN 300 MG PO CAPS
300.0000 mg | ORAL_CAPSULE | ORAL | Status: AC
  Administered 2022-04-17: 300 mg via ORAL
  Filled 2022-04-17: qty 1

## 2022-04-17 MED ORDER — BUPIVACAINE HCL 0.25 % IJ SOLN
INTRAMUSCULAR | Status: DC | PRN
  Administered 2022-04-17: 31 mL

## 2022-04-17 MED ORDER — ONDANSETRON HCL 4 MG/2ML IJ SOLN
INTRAMUSCULAR | Status: DC | PRN
  Administered 2022-04-17: 4 mg via INTRAVENOUS

## 2022-04-17 MED ORDER — PROPOFOL 10 MG/ML IV BOLUS
INTRAVENOUS | Status: AC
  Filled 2022-04-17: qty 20

## 2022-04-17 MED ORDER — SCOPOLAMINE 1 MG/3DAYS TD PT72
1.0000 | MEDICATED_PATCH | TRANSDERMAL | Status: DC
  Administered 2022-04-17: 1.5 mg via TRANSDERMAL
  Filled 2022-04-17: qty 1

## 2022-04-17 MED ORDER — ROCURONIUM BROMIDE 10 MG/ML (PF) SYRINGE
PREFILLED_SYRINGE | INTRAVENOUS | Status: AC
  Filled 2022-04-17: qty 10

## 2022-04-17 MED ORDER — ACETAMINOPHEN 500 MG PO TABS
1000.0000 mg | ORAL_TABLET | ORAL | Status: AC
  Administered 2022-04-17: 1000 mg via ORAL
  Filled 2022-04-17: qty 2

## 2022-04-17 MED ORDER — PHENYLEPHRINE 80 MCG/ML (10ML) SYRINGE FOR IV PUSH (FOR BLOOD PRESSURE SUPPORT)
PREFILLED_SYRINGE | INTRAVENOUS | Status: AC
  Filled 2022-04-17: qty 10

## 2022-04-17 MED ORDER — DROPERIDOL 2.5 MG/ML IJ SOLN
INTRAMUSCULAR | Status: DC | PRN
  Administered 2022-04-17: .625 mg via INTRAVENOUS

## 2022-04-17 MED ORDER — ONDANSETRON HCL 4 MG/2ML IJ SOLN
INTRAMUSCULAR | Status: AC
  Filled 2022-04-17: qty 2

## 2022-04-17 MED ORDER — HEPARIN SODIUM (PORCINE) 5000 UNIT/ML IJ SOLN
5000.0000 [IU] | INTRAMUSCULAR | Status: AC
  Administered 2022-04-17: 5000 [IU] via SUBCUTANEOUS
  Filled 2022-04-17: qty 1

## 2022-04-17 MED ORDER — ORAL CARE MOUTH RINSE: 15.0000 mL | Freq: Once | OROMUCOSAL | Status: AC

## 2022-04-17 MED ORDER — LIDOCAINE HCL (PF) 2 % IJ SOLN
INTRAMUSCULAR | Status: DC | PRN
  Administered 2022-04-17: 1.5 mg/kg/h via INTRADERMAL

## 2022-04-17 MED ORDER — BUPIVACAINE HCL 0.25 % IJ SOLN
INTRAMUSCULAR | Status: AC
  Filled 2022-04-17: qty 1

## 2022-04-17 MED ORDER — SUGAMMADEX SODIUM 500 MG/5ML IV SOLN
INTRAVENOUS | Status: AC
  Filled 2022-04-17: qty 5

## 2022-04-17 MED ORDER — LIDOCAINE HCL 2 % IJ SOLN
INTRAMUSCULAR | Status: AC
  Filled 2022-04-17: qty 20

## 2022-04-17 MED ORDER — KETAMINE HCL 10 MG/ML IJ SOLN
INTRAMUSCULAR | Status: DC | PRN
  Administered 2022-04-17: 20 mg via INTRAVENOUS

## 2022-04-17 MED ORDER — DEXAMETHASONE SODIUM PHOSPHATE 4 MG/ML IJ SOLN
4.0000 mg | INTRAMUSCULAR | Status: AC
  Administered 2022-04-17: 4 mg via INTRAVENOUS

## 2022-04-17 MED ORDER — MIDAZOLAM HCL 5 MG/5ML IJ SOLN
INTRAMUSCULAR | Status: DC | PRN
  Administered 2022-04-17: 2 mg via INTRAVENOUS

## 2022-04-17 SURGICAL SUPPLY — 75 items
ADH SKN CLS APL DERMABOND .7 (GAUZE/BANDAGES/DRESSINGS) ×2
AGENT HMST KT MTR STRL THRMB (HEMOSTASIS)
APL ESCP 34 STRL LF DISP (HEMOSTASIS)
APPLICATOR SURGIFLO ENDO (HEMOSTASIS) IMPLANT
BAG LAPAROSCOPIC 12 15 PORT 16 (BASKET) IMPLANT
BAG RETRIEVAL 12/15 (BASKET)
BLADE SURG SZ10 CARB STEEL (BLADE) IMPLANT
COVER BACK TABLE 60X90IN (DRAPES) ×2 IMPLANT
COVER TIP SHEARS 8 DVNC (MISCELLANEOUS) ×2 IMPLANT
COVER TIP SHEARS 8MM DA VINCI (MISCELLANEOUS) ×2
DERMABOND ADVANCED .7 DNX12 (GAUZE/BANDAGES/DRESSINGS) ×2 IMPLANT
DRAPE ARM DVNC X/XI (DISPOSABLE) ×8 IMPLANT
DRAPE COLUMN DVNC XI (DISPOSABLE) ×2 IMPLANT
DRAPE DA VINCI XI ARM (DISPOSABLE) ×8
DRAPE DA VINCI XI COLUMN (DISPOSABLE) ×2
DRAPE SHEET LG 3/4 BI-LAMINATE (DRAPES) ×2 IMPLANT
DRAPE SURG IRRIG POUCH 19X23 (DRAPES) ×2 IMPLANT
DRSG OPSITE POSTOP 4X6 (GAUZE/BANDAGES/DRESSINGS) IMPLANT
DRSG OPSITE POSTOP 4X8 (GAUZE/BANDAGES/DRESSINGS) IMPLANT
ELECT PENCIL ROCKER SW 15FT (MISCELLANEOUS) IMPLANT
ELECT REM PT RETURN 15FT ADLT (MISCELLANEOUS) ×2 IMPLANT
GAUZE 4X4 16PLY ~~LOC~~+RFID DBL (SPONGE) ×4 IMPLANT
GLOVE BIO SURGEON STRL SZ 6 (GLOVE) ×8 IMPLANT
GLOVE BIO SURGEON STRL SZ 6.5 (GLOVE) ×3 IMPLANT
GOWN STRL REUS W/ TWL LRG LVL3 (GOWN DISPOSABLE) ×9 IMPLANT
GOWN STRL REUS W/TWL LRG LVL3 (GOWN DISPOSABLE) ×10
GRASPER SUT TROCAR 14GX15 (MISCELLANEOUS) ×1 IMPLANT
HOLDER FOLEY CATH W/STRAP (MISCELLANEOUS) IMPLANT
IRRIG SUCT STRYKERFLOW 2 WTIP (MISCELLANEOUS) ×2
IRRIGATION SUCT STRKRFLW 2 WTP (MISCELLANEOUS) ×2 IMPLANT
KIT PROCEDURE DA VINCI SI (MISCELLANEOUS)
KIT PROCEDURE DVNC SI (MISCELLANEOUS) IMPLANT
KIT TURNOVER KIT A (KITS) ×1 IMPLANT
LIGASURE IMPACT 36 18CM CVD LR (INSTRUMENTS) IMPLANT
MANIPULATOR ADVINCU DEL 3.0 PL (MISCELLANEOUS) IMPLANT
MANIPULATOR ADVINCU DEL 3.5 PL (MISCELLANEOUS) IMPLANT
MANIPULATOR UTERINE 4.5 ZUMI (MISCELLANEOUS) IMPLANT
NDL HYPO 21X1.5 SAFETY (NEEDLE) ×1 IMPLANT
NDL SPNL 18GX3.5 QUINCKE PK (NEEDLE) IMPLANT
NEEDLE HYPO 21X1.5 SAFETY (NEEDLE) ×2 IMPLANT
NEEDLE SPNL 18GX3.5 QUINCKE PK (NEEDLE) IMPLANT
OBTURATOR OPTICAL STANDARD 8MM (TROCAR) ×2
OBTURATOR OPTICAL STND 8 DVNC (TROCAR) ×2
OBTURATOR OPTICALSTD 8 DVNC (TROCAR) ×2 IMPLANT
PACK ROBOT GYN CUSTOM WL (TRAY / TRAY PROCEDURE) ×2 IMPLANT
PAD POSITIONING PINK XL (MISCELLANEOUS) ×2 IMPLANT
PORT ACCESS TROCAR AIRSEAL 12 (TROCAR) ×2 IMPLANT
PORT ACCESS TROCAR AIRSEAL 5M (TROCAR) ×2
SEAL CANN UNIV 5-8 DVNC XI (MISCELLANEOUS) ×8 IMPLANT
SEAL XI 5MM-8MM UNIVERSAL (MISCELLANEOUS) ×8
SET TRI-LUMEN FLTR TB AIRSEAL (TUBING) ×2 IMPLANT
SOL PREP POV-IOD 4OZ 10% (MISCELLANEOUS) ×2 IMPLANT
SPIKE FLUID TRANSFER (MISCELLANEOUS) ×2 IMPLANT
SPONGE T-LAP 18X18 ~~LOC~~+RFID (SPONGE) IMPLANT
SURGIFLO W/THROMBIN 8M KIT (HEMOSTASIS) IMPLANT
SUT MNCRL AB 4-0 PS2 18 (SUTURE) ×1 IMPLANT
SUT PDS AB 1 TP1 96 (SUTURE) IMPLANT
SUT VIC AB 0 CT1 27 (SUTURE)
SUT VIC AB 0 CT1 27XBRD ANTBC (SUTURE) IMPLANT
SUT VIC AB 2-0 CT1 27 (SUTURE)
SUT VIC AB 2-0 CT1 TAPERPNT 27 (SUTURE) IMPLANT
SUT VIC AB 4-0 PS2 18 (SUTURE) ×4 IMPLANT
SUT VLOC 180 0 9IN  GS21 (SUTURE)
SUT VLOC 180 0 9IN GS21 (SUTURE) IMPLANT
SYR 10ML LL (SYRINGE) IMPLANT
SYS BAG RETRIEVAL 10MM (BASKET)
SYS WOUND ALEXIS 18CM MED (MISCELLANEOUS)
SYSTEM BAG RETRIEVAL 10MM (BASKET) IMPLANT
SYSTEM WOUND ALEXIS 18CM MED (MISCELLANEOUS) IMPLANT
TOWEL OR NON WOVEN STRL DISP B (DISPOSABLE) IMPLANT
TRAP SPECIMEN MUCUS 40CC (MISCELLANEOUS) ×1 IMPLANT
TRAY FOLEY MTR SLVR 16FR STAT (SET/KITS/TRAYS/PACK) ×2 IMPLANT
UNDERPAD 30X36 HEAVY ABSORB (UNDERPADS AND DIAPERS) ×4 IMPLANT
WATER STERILE IRR 1000ML POUR (IV SOLUTION) ×2 IMPLANT
YANKAUER SUCT BULB TIP 10FT TU (MISCELLANEOUS) IMPLANT

## 2022-04-17 NOTE — Anesthesia Preprocedure Evaluation (Signed)
Anesthesia Evaluation  Patient identified by MRN, date of birth, ID band Patient awake    Reviewed: Allergy & Precautions, NPO status , Patient's Chart, lab work & pertinent test results  Airway Mallampati: I  TM Distance: >3 FB Neck ROM: Full    Dental no notable dental hx.    Pulmonary sleep apnea and Continuous Positive Airway Pressure Ventilation , former smoker,    Pulmonary exam normal breath sounds clear to auscultation       Cardiovascular hypertension, Normal cardiovascular exam Rhythm:Regular Rate:Normal     Neuro/Psych negative neurological ROS  negative psych ROS   GI/Hepatic Neg liver ROS, GERD  ,  Endo/Other  Morbid obesity  Renal/GU negative Renal ROS  negative genitourinary   Musculoskeletal negative musculoskeletal ROS (+)   Abdominal   Peds negative pediatric ROS (+)  Hematology negative hematology ROS (+)   Anesthesia Other Findings   Reproductive/Obstetrics negative OB ROS                             Anesthesia Physical Anesthesia Plan  ASA: 3  Anesthesia Plan: General   Post-op Pain Management: Lidocaine infusion*   Induction: Intravenous  PONV Risk Score and Plan: 3 and Ondansetron, Dexamethasone, Midazolam and Treatment may vary due to age or medical condition  Airway Management Planned: Oral ETT  Additional Equipment:   Intra-op Plan:   Post-operative Plan: Extubation in OR  Informed Consent: I have reviewed the patients History and Physical, chart, labs and discussed the procedure including the risks, benefits and alternatives for the proposed anesthesia with the patient or authorized representative who has indicated his/her understanding and acceptance.     Dental advisory given  Plan Discussed with: CRNA and Surgeon  Anesthesia Plan Comments:         Anesthesia Quick Evaluation

## 2022-04-17 NOTE — Anesthesia Procedure Notes (Signed)
Procedure Name: Intubation Date/Time: 04/17/2022 8:41 AM  Performed by: Deliah Boston, CRNAPre-anesthesia Checklist: Patient identified, Emergency Drugs available, Suction available and Patient being monitored Patient Re-evaluated:Patient Re-evaluated prior to induction Oxygen Delivery Method: Circle system utilized Preoxygenation: Pre-oxygenation with 100% oxygen Induction Type: IV induction Ventilation: Mask ventilation without difficulty Laryngoscope Size: Mac and 4 Grade View: Grade I Tube type: Oral Tube size: 7.5 mm Number of attempts: 1 Airway Equipment and Method: Stylet Placement Confirmation: ETT inserted through vocal cords under direct vision, positive ETCO2 and breath sounds checked- equal and bilateral Secured at: 21 cm Tube secured with: Tape Dental Injury: Teeth and Oropharynx as per pre-operative assessment

## 2022-04-17 NOTE — H&P (Signed)
Gynecologic Oncology H&P  04/17/22  Treatment History: Oncology History  Malignant neoplasm of upper-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, estrogen receptor positive ([place])  09/08/2020 Cancer Staging   Staging form: Breast, AJCC 8th Edition - Clinical stage from 09/08/2020: Stage IA (cT1b, cN0, cM0, G2, ER+, PR+, HER2+) - Signed by Gardenia Phlegm, NP on 09/20/2020 Stage prefix: Initial diagnosis   09/08/2020 Initial Diagnosis   Mammogram detected 0.7 cm mass in the right breast 1 o'clock position, right breast biopsy 1: Grade 2 IDC ER 95% PR 95%, Ki-67 30%, HER-2 positive ratio 2.83   10/16/2020 Surgery   Right lumpectomy [name]): IDC, 1.0cm, grade 3, with high grade DCIS, clear margins, 4 right axillary lymph nodes negative for carcinoma.   10/16/2020 Cancer Staging   Staging form: Breast, AJCC 8th Edition - Pathologic stage from 10/16/2020: Stage IA (pT1b, pN0, cM0, G3, ER+, PR+, HER2+) - Signed by Gardenia Phlegm, NP on 11/01/2020 Histologic grading system: 3 grade system   11/13/2020 - 12/14/2021 Chemotherapy   Patient is on Treatment Plan : BREAST Paclitaxel + Trastuzumab q7d / Trastuzumab q21d     02/15/2021 - 04/04/2021 Radiation Therapy   Site Technique Total Dose (Gy) Dose per Fx (Gy) Completed Fx Beam Energies  Breast, Right: Breast_Rt 3D 50.4/50.4 1.8 28/28 10X  Breast, Right: Breast_Rt_Bst 3D 10/10 2 5/5 6X, 10X     07/20/2021 -  Anti-estrogen oral therapy   Letrozole x 7 years   11/23/2021 Miscellaneous   Genetics (done at [facility]):  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 No changes  Past Medical/Surgical History: Past Medical History:  Diagnosis Date   Anxiety    Breast cancer in female [facility])    Right   Depression    GERD (gastroesophageal reflux disease)    Hypertension    Palpitations    Port-A-Cath in place 11/20/2020   Pre-diabetes    Sleep apnea    uses CPAP    Past Surgical  History:  Procedure Laterality Date   BREAST LUMPECTOMY WITH RADIOACTIVE SEED AND SENTINEL LYMPH NODE BIOPSY Right 10/16/2020   Procedure: RIGHT BREAST LUMPECTOMY WITH RADIOACTIVE SEED AND SENTINEL LYMPH NODE BIOPSY;  Surgeon: [name], MD;  Location: [facility];  Service: General;  Laterality: Right;   BREAST SURGERY     PORT-A-CATH REMOVAL N/A 12/28/2021   Procedure: REMOVAL PORT-A-CATH;  Surgeon: [name], MD;  Location: [facility];  Service: General;  Laterality: N/A;   PORTACATH PLACEMENT Left 10/16/2020   Procedure: INSERTION PORT-A-CATH;  Surgeon: [name], MD;  Location: [place];  Service: General;  Laterality: Left;   TUBAL LIGATION      Family History  Problem Relation Age of Onset   Heart disease Mother    Diabetes Mother    Hyperlipidemia Mother    Hypertension Mother    Kidney failure Mother    Breast cancer Mother    Sleep apnea Father    Diabetes Father    Hyperlipidemia Father    Hypertension Father    Prostate cancer Father    Diabetes Other    Stroke Other    Hypertension Other    Kidney failure Other    Coronary artery disease Other    Colon cancer Neg Hx    Ovarian cancer Neg Hx    Endometrial cancer Neg Hx    Pancreatic cancer Neg Hx     Social History   Socioeconomic  History   Marital status: Single    Spouse name: Not on file   Number of children: 2   Years of education: Not on file   Highest education level: Not on file  Occupational History   Occupation: medical records    Employer: [employer]  Tobacco Use   Smoking status: Former    Types: Cigarettes    Quit date: 07/29/1996    Years since quitting: 25.7   Smokeless tobacco: Never  Vaping Use   Vaping Use: Never used  Substance and Sexual Activity   Alcohol use: Yes    Comment: socially   Drug use: No   Sexual activity: Not Currently    Birth control/protection: Surgical  Other Topics Concern   Not on file  Social History Narrative   Not on file    Social Determinants of Health   Financial Resource Strain: Not on file  Food Insecurity: Not on file  Transportation Needs: Not on file  Physical Activity: Not on file  Stress: Not on file  Social Connections: Not on file    Current Medications:  Current Facility-Administered Medications:    dexamethasone (DECADRON) injection 4 mg, 4 mg, Intravenous, On Call to OR, Cross, Melissa D, NP   droperidol (INAPSINE) 2.5 MG/ML injection, , , ,    ketorolac (TORADOL) 15 MG/ML injection 15 mg, 15 mg, Intravenous, On Call to OR, Cross, Melissa D, NP   lactated ringers infusion, , Intravenous, Continuous, Hodierne, Adam, MD, Last Rate: 10 mL/hr at 04/17/22 0803, Continued from Pre-op at 04/17/22 0803   scopolamine (TRANSDERM-SCOP) 1 MG/3DAYS 1.5 mg, 1 patch, Transdermal, On Call to OR, Cross, Melissa D, NP, 1.5 mg at 04/17/22 0636  Review of Systems: Denies appetite changes, fevers, chills, fatigue, unexplained weight changes. Denies hearing loss, neck lumps or masses, mouth sores, ringing in ears or voice changes. Denies cough or wheezing.  Denies shortness of breath. Denies chest pain or palpitations. Denies leg swelling. Denies abdominal distention, pain, blood in stools, constipation, diarrhea, nausea, vomiting, or early satiety. Denies pain with intercourse, dysuria, frequency, hematuria or incontinence. Denies hot flashes, pelvic pain, vaginal bleeding or vaginal discharge.   Denies joint pain, back pain or muscle pain/cramps. Denies itching, rash, or wounds. Denies dizziness, headaches, numbness or seizures. Denies swollen lymph nodes or glands, denies easy bruising or bleeding. Denies anxiety, depression, confusion, or decreased concentration.  Physical Exam: BP 118/70   Pulse 67   Temp 98.4 F (36.9 C) (Oral)   Resp 17   Ht 5' 6"  (1.676 m)   Wt 250 lb (113.4 kg)   LMP 12/22/2017   SpO2 97%   BMI 40.35 kg/m  General: Alert, oriented, no acute distress.  HEENT:  Normocephalic, atraumatic. Sclera anicteric.  Chest: Clear to auscultation bilaterally. No wheezes, rhonchi, or rales. Cardiovascular: Regular rate and rhythm, no murmurs, rubs, or gallops.  Abdomen: Obese. Normoactive bowel sounds. Soft, nondistended, nontender to palpation. No masses or hepatosplenomegaly appreciated. No palpable fluid wave.  Extremities: Grossly normal range of motion. Warm, well perfused. No edema bilaterally.   Laboratory & Radiologic Studies: CBC    Component Value Date/Time   WBC 5.4 04/09/2022 1030   RBC 4.72 04/09/2022 1030   HGB 13.4 04/09/2022 1030   HGB 13.3 10/12/2021 0832   HGB 12.6 10/07/2017 1200   HCT 41.1 04/09/2022 1030   HCT 39.4 10/07/2017 1200   PLT 299 04/09/2022 1030   PLT 255 10/12/2021 0832   MCV 87.1 04/09/2022 1030   MCV  83 10/07/2017 1200   MCH 28.4 04/09/2022 1030   MCHC 32.6 04/09/2022 1030   RDW 15.3 04/09/2022 1030   RDW 16.6 (H) 10/07/2017 1200   LYMPHSABS 1.3 10/12/2021 0832   LYMPHSABS 2.8 10/07/2017 1200   MONOABS 0.6 10/12/2021 0832   EOSABS 0.3 10/12/2021 0832   EOSABS 0.2 10/07/2017 1200   BASOSABS 0.0 10/12/2021 0832   BASOSABS 0.0 10/07/2017 1200      Latest Ref Rng & Units 04/11/2022    9:39 AM 04/09/2022   10:30 AM 12/26/2021    9:17 AM  BMP  Glucose 70 - 99 mg/dL 104  128  129   BUN 6 - 20 mg/dL 15  15  14    Creatinine 0.44 - 1.00 mg/dL 0.98  0.86  0.94   Sodium 135 - 145 mmol/L 139  140  140   Potassium 3.5 - 5.1 mmol/L 3.9  3.8  3.9   Chloride 98 - 111 mmol/L 105  108  106   CO2 22 - 32 mmol/L 26  26  28    Calcium 8.9 - 10.3 mg/dL 9.6  9.7  9.2    Assessment & Plan: Grace Pennington is a 51 y.o. woman with with history of breast cancer and known PALB2 who presents for risk-reducing surgery.  Plan for robotic BSO, possible staging, any other indicated procedures. Please see counseling from 03/15/22 clinic visit.  Jeral Pinch, MD  Division of Gynecologic Oncology  Department of Obstetrics and  Gynecology  [facility]

## 2022-04-17 NOTE — Anesthesia Postprocedure Evaluation (Signed)
Anesthesia Post Note  Patient: Grace Pennington  Procedure(s) Performed: XI ROBOTIC ASSISTED BILATERAL SALPINGO OOPHORECTOMY (Bilateral: Pelvis)     Patient location during evaluation: PACU Anesthesia Type: General Level of consciousness: awake and alert Pain management: pain level controlled Vital Signs Assessment: post-procedure vital signs reviewed and stable Respiratory status: spontaneous breathing, nonlabored ventilation, respiratory function stable and patient connected to nasal cannula oxygen Cardiovascular status: blood pressure returned to baseline and stable Postop Assessment: no apparent nausea or vomiting Anesthetic complications: no   No notable events documented.  Last Vitals:  Vitals:   04/17/22 1030 04/17/22 1045  BP: 121/83 115/73  Pulse: 70 62  Resp: 18 17  Temp:    SpO2: 99% 100%    Last Pain:  Vitals:   04/17/22 1100  TempSrc:   PainSc: 6                  Cher Egnor S

## 2022-04-17 NOTE — Discharge Instructions (Addendum)
AFTER SURGERY INSTRUCTIONS   Return to work: 4-6 weeks if applicable  You can restart your letrozole 3 days after surgery.   Activity: 1. Be up and out of the bed during the day.  Take a nap if needed.  You may walk up steps but be careful and use the hand rail.  Stair climbing will tire you more than you think, you may need to stop part way and rest.    2. No lifting or straining for 6 weeks over 10 pounds. No pushing, pulling, straining for 6 weeks.   3. No driving for 1 week(s).  Do not drive if you are taking narcotic pain medicine and make sure that your reaction time has returned.    4. You can shower as soon as the next day after surgery. Shower daily.  Use your regular soap and water (not directly on the incision) and pat your incision(s) dry afterwards; don't rub.  No tub baths or submerging your body in water until cleared by your surgeon. If you have the soap that was given to you by pre-surgical testing that was used before surgery, you do not need to use it afterwards because this can irritate your incisions.    5. No sexual activity and nothing in the vagina for 4 weeks.   6. You may experience a small amount of clear drainage from your incisions, which is normal.  If the drainage persists, increases, or changes color please call the office.   7. Do not use creams, lotions, or ointments such as neosporin on your incisions after surgery until advised by your surgeon because they can cause removal of the dermabond glue on your incisions.     8. You may experience vaginal spotting after surgery.  The spotting is normal but if you experience heavy bleeding, call our office.   9. Take Tylenol or ibuprofen (use sparingly and take with food since ibuprofen plus celexa can increase your risk for gastrointestinal bleeding) first for pain if you are able to take these medications and only use narcotic pain medication for severe pain not relieved by the Tylenol or Ibuprofen.  Monitor your  Tylenol intake to a max of 4,000 mg in a 24 hour period. You can alternate these medications after surgery.   Diet: 1. Low sodium Heart Healthy Diet is recommended but you are cleared to resume your normal (before surgery) diet after your procedure.   2. It is safe to use a laxative, such as Miralax or Colace, if you have difficulty moving your bowels. You have been prescribed Sennakot-S to take at bedtime every evening after surgery to keep bowel movements regular and to prevent constipation.     Wound Care: 1. Keep clean and dry.  Shower daily.   Reasons to call the Doctor: Fever - Oral temperature greater than 100.4 degrees Fahrenheit Foul-smelling vaginal discharge Difficulty urinating Nausea and vomiting Increased pain at the site of the incision that is unrelieved with pain medicine. Difficulty breathing with or without chest pain New calf pain especially if only on one side Sudden, continuing increased vaginal bleeding with or without clots.   Contacts: For questions or concerns you should contact:   Dr. Jeral Pinch at [phone]   Joylene John, NP at [phone]   After Hours: call [phone] and have the GYN Oncologist paged/contacted (after 5 pm or on the weekends).   Messages sent via mychart are for non-urgent matters and are not responded to after hours so for urgent needs,  please call the after hours number.

## 2022-04-17 NOTE — Op Note (Signed)
OPERATIVE NOTE  Pre-operative Diagnosis: PALB2 mutation, ER+ breast cancer  Post-operative Diagnosis: same, uterine fibroids  Operation: Robotic-assisted laparoscopic bilateral salpingoophorectomy   Surgeon: Jeral Pinch MD  Assistant Surgeon: Joylene John NP  Anesthesia: GET  Urine Output: 100 cc  Operative Findings: On EUA, 8 cm mobile uterus, no adnexal masses. On intra-abdominal entry, normal upper abdominal survey. Normal omentum, small and large bowel. No ascites. Normal appearing bilateral adnexa. Some calcifications of the ovarian arteries, more prominent on the right at the level of the pelvic brim. Uterus 8cm with multiple subserosal up to 1cm fibroids (see pictures in media). No intra-abdominal or pelvic evidence of metastatic disease.   Estimated Blood Loss:  50 cc      Total IV Fluids: see I&O flowsheet         Specimens: bilateral tubes and ovaries, pelvic washings         Complications:  None apparent; patient tolerated the procedure well.         Disposition: PACU - hemodynamically stable.  Procedure Details  The patient was seen in the Holding Room. The risks, benefits, complications, treatment options, and expected outcomes were discussed with the patient.  The patient concurred with the proposed plan, giving informed consent.  The site of surgery properly noted/marked. The patient was identified as Grace Pennington and the procedure verified as a Robotic-assisted bilateral salpingo-oophorectomy with any other indicated procedures.   After induction of anesthesia, the patient was draped and prepped in the usual sterile manner. Patient was placed in supine position after anesthesia and draped and prepped in the usual sterile manner as follows: Her arms were tucked to her side with all appropriate precautions.  The patient was secured to the bed with padding and tape across her chest..  The patient was placed in the semi-lithotomy position in [place].  The  perineum and vagina were prepped with Betadine. CholoraPrep was used prep the abdomen. The patient was draped after the CholoraPrep had been allowed to dry for 3 minutes.  A Time Out was held and the above information confirmed.  The urethra was prepped with Betadine. Foley catheter was placed.  A sterile speculum was placed in the vagina.  The cervix was grasped with a single-tooth tenaculum. A Hulka manipulator was placed. OG tube placement was confirmed and to suction.   Next, a 10 mm skin incision was made 1 cm below the subcostal margin in the midclavicular line.  The 5 mm Optiview port and scope was used for direct entry.  Opening pressure was under 10 mm CO2.  The abdomen was insufflated and the findings were noted as above.   At this point and all points during the procedure, the patient's intra-abdominal pressure did not exceed 15 mmHg. Next, an 8 mm skin incision was made inferior to the umbilicus and a right and left port were placed about 8 cm lateral to the robot port on the right and left side.  The 5 mm assist trocar was exchanged for a 10-12 mm port. All ports were placed under direct visualization.  The patient was placed in steep Trendelenburg.  Bowel was already noted to be in the upper abdomen.  The robot was docked in the normal manner.  The right and left peritoneum were opened parallel to the IP ligament to open the retroperitoneal spaces bilaterally. The round ligaments were preserved. The ureter was noted to be on the medial leaf of the broad ligament.  The peritoneum above the ureter was incised  and stretched and the infundibulopelvic ligament was skeletonized, cauterized and cut, assuring at least 2 cm margin from the proximal portion of the ovary and fallopian tube.  The utero-ovarian ligament and fallopian tube were skeletonized, cauterized and transected just lateral to the uterine fundus, freeing the adnexa. Each adnexa was placed in an Endocatch bag and removed through the  assist trocar.   Irrigation was used and excellent hemostasis was achieved.  At this point in the procedure was completed.  Robotic instruments were removed under direct visulaization.  The robot was undocked. The fascia at the 10-12 mm port was closed with 0 Vicryl using a PMI fascial closure device under direct visualization.  The subcuticular tissue was closed with 4-0 Vicryl and the skin was closed with 4-0 Monocryl in a subcuticular manner.  Dermabond was applied.    The vagina was swabbed after the Hulka manipulator was removed with minimal bleeding noted. Foley catheter was removed.  All sponge, lap and needle counts were correct x  3.   The patient was transferred to the recovery room in stable condition.  Jeral Pinch, MD

## 2022-04-17 NOTE — Addendum Note (Signed)
Addendum  created 04/17/22 1341 by Deliah Boston, CRNA   Flowsheet accepted, Intraprocedure Meds edited

## 2022-04-17 NOTE — Transfer of Care (Signed)
Immediate Anesthesia Transfer of Care Note  Patient: Grace Pennington  Procedure(s) Performed: Procedure(s): XI ROBOTIC ASSISTED BILATERAL SALPINGO OOPHORECTOMY (Bilateral)  Patient Location: PACU  Anesthesia Type:General  Level of Consciousness: Patient easily awoken, sedated, comfortable, cooperative, following commands, responds to stimulation.   Airway & Oxygen Therapy: Patient spontaneously breathing, ventilating well, oxygen via simple oxygen mask.  Post-op Assessment: Report given to PACU RN, vital signs reviewed and stable, moving all extremities.   Post vital signs: Reviewed and stable.  Complications: No apparent anesthesia complications  Last Vitals:  Vitals Value Taken Time  BP 109/73 1012  Temp    Pulse 76 1012  Resp 16 1012  SpO2 100 1012    Last Pain:  Vitals:   04/17/22 0634  TempSrc:   PainSc: 0-No pain         Complications: No notable events documented.

## 2022-04-18 ENCOUNTER — Other Ambulatory Visit (HOSPITAL_COMMUNITY): Payer: Self-pay

## 2022-04-18 ENCOUNTER — Telehealth: Payer: Self-pay

## 2022-04-18 ENCOUNTER — Encounter (HOSPITAL_COMMUNITY): Payer: Self-pay | Admitting: Gynecologic Oncology

## 2022-04-18 LAB — CYTOLOGY - NON PAP

## 2022-04-18 LAB — SURGICAL PATHOLOGY

## 2022-04-18 NOTE — Telephone Encounter (Signed)
Spoke with Grace Pennington this morning. She states she is eating, drinking and urinating well. She has not had a BM yet but is passing gas. She is waiting for the senokot to be delivered. She has miralax at home.  Told her to take a capful bid until she gets the senokot. She can then take the senokot 2 tabs at hs as prescribed.  She denies fever or chills. Incisions are dry and intact. She rates her pain 3/10. Her pain is controlled with tylenol and Oxycodone. Suggested that she add Ibuprofen 400 mg every 6 hours with food to help with the inflammation.    Instructed to call office with any fever, chills, purulent drainage, uncontrolled pain or any other questions or concerns. Patient verbalizes understanding.   Pt aware of post op appointments as well as the office number [phone] and after hours number [phone] to call if she has any questions or concerns

## 2022-04-23 ENCOUNTER — Telehealth: Payer: Self-pay | Admitting: Gynecologic Oncology

## 2022-04-23 ENCOUNTER — Other Ambulatory Visit (HOSPITAL_COMMUNITY): Payer: Self-pay

## 2022-04-23 ENCOUNTER — Telehealth: Payer: Self-pay | Admitting: *Deleted

## 2022-04-23 MED ORDER — ROSUVASTATIN CALCIUM 10 MG PO TABS
10.0000 mg | ORAL_TABLET | Freq: Every day | ORAL | 3 refills | Status: DC
  Filled 2022-04-23 – 2022-07-04 (×4): qty 90, 90d supply, fill #0
  Filled 2022-09-25: qty 90, 90d supply, fill #1
  Filled 2022-10-19 – 2022-12-25 (×3): qty 90, 90d supply, fill #2
  Filled 2023-03-10: qty 90, 90d supply, fill #3

## 2022-04-23 NOTE — Telephone Encounter (Signed)
Called patient to check in after surgery. No answer. Left voicemail with callback number if she needs anything and letting her know path results were released to her with a note in mychart.  Jeral Pinch MD Gynecologic Oncology

## 2022-04-23 NOTE — Telephone Encounter (Signed)
Patient returned Dr Charisse March call and stated "let Dr Berline Lopes know I got her message and I'm am doing well after surgery. Also received my pathology report. I have no questions/concerns."

## 2022-04-25 IMAGING — US US BREAST*R* LIMITED INC AXILLA
1 series · 11 of 11 positions shown · non-contrast
Comparison: Previous exams.

CLINICAL DATA: Ultrasound only screening recall for a 0.7 cm
irregular mass in the upper inner posterior right breast.

EXAM:
ULTRASOUND OF THE RIGHT BREAST

[Series 1: us breast*right* limited inc axilla · 0.07mm/px · 11 of 11 slices shown]
[im 1/11]
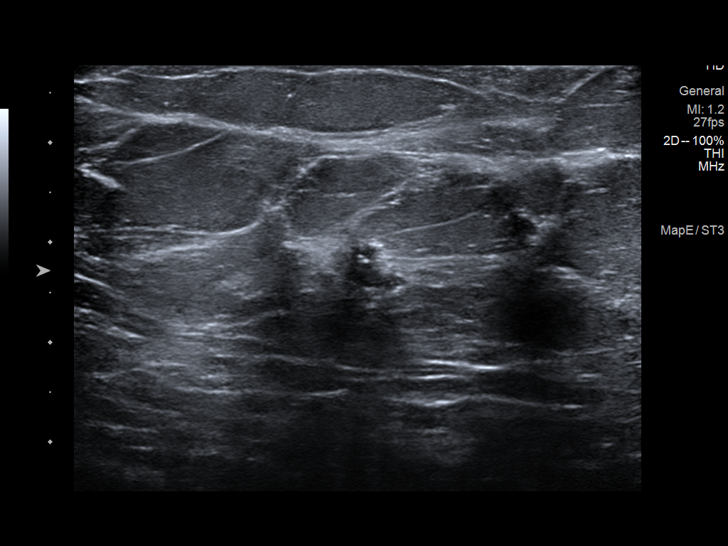
[im 2/11]
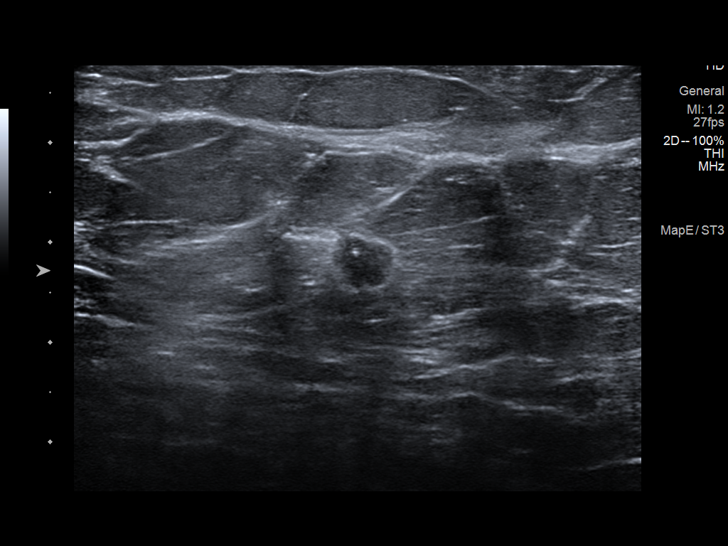
[im 3/11]
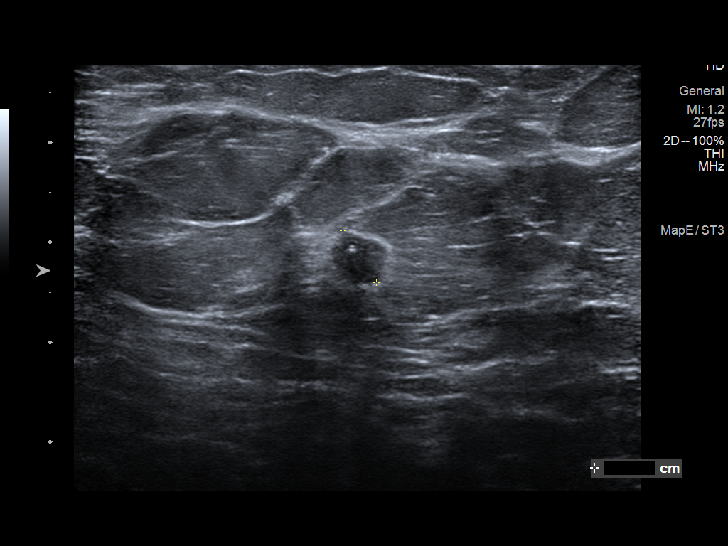
[im 4/11]
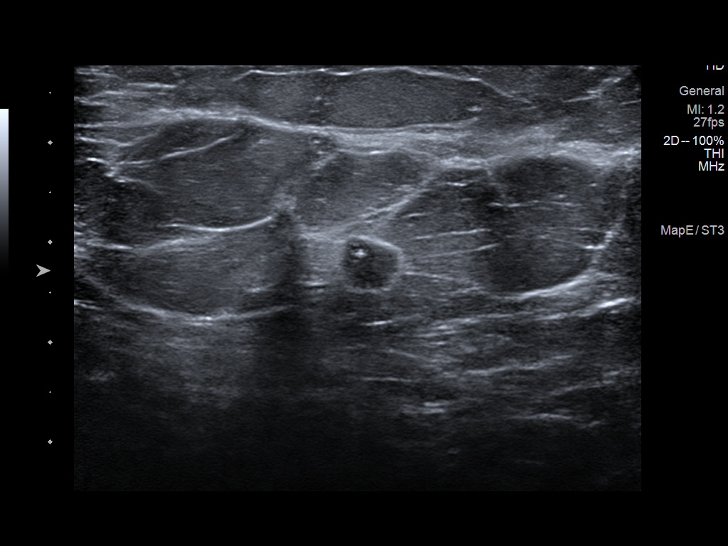
[im 5/11]
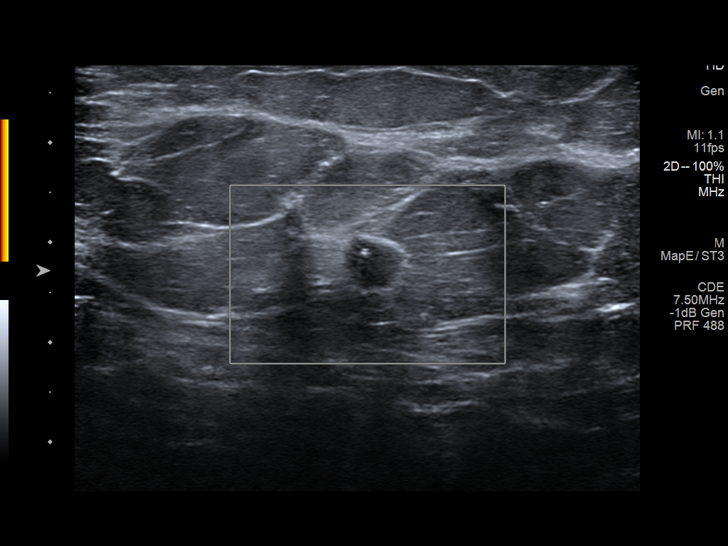
[im 6/11]
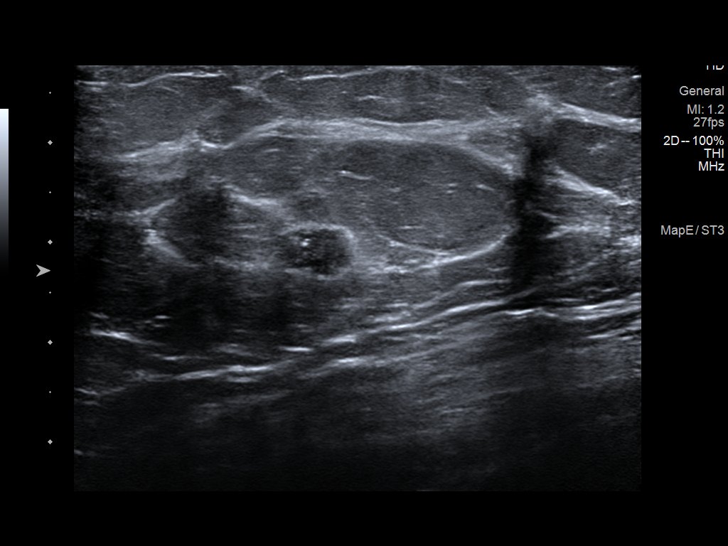
[im 7/11]
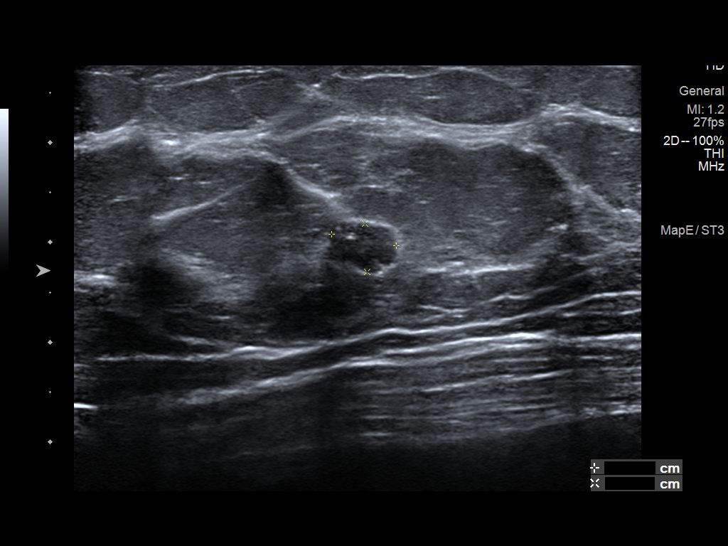
[im 8/11]
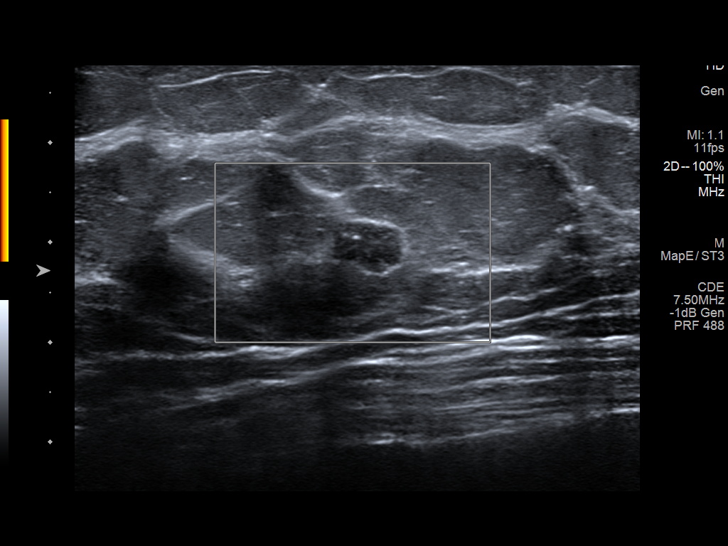
[im 9/11]
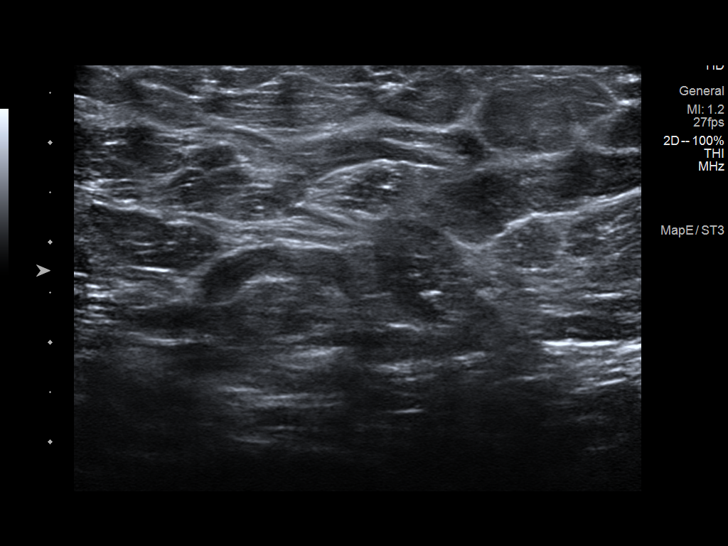
[im 10/11]
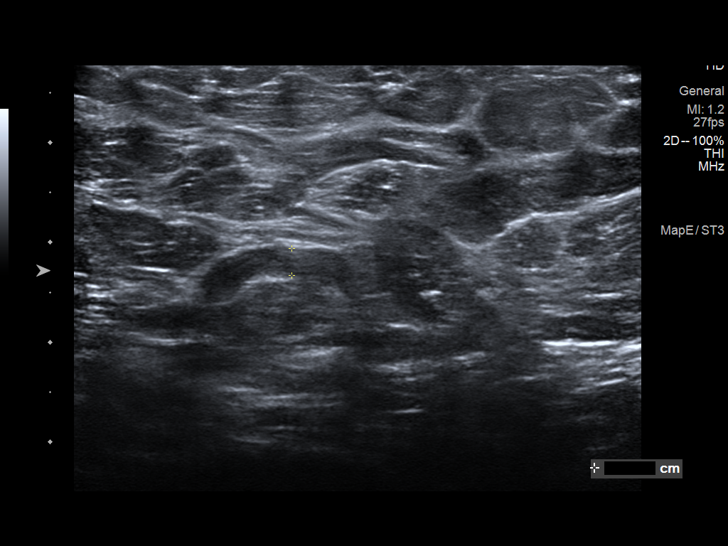
[im 11/11]
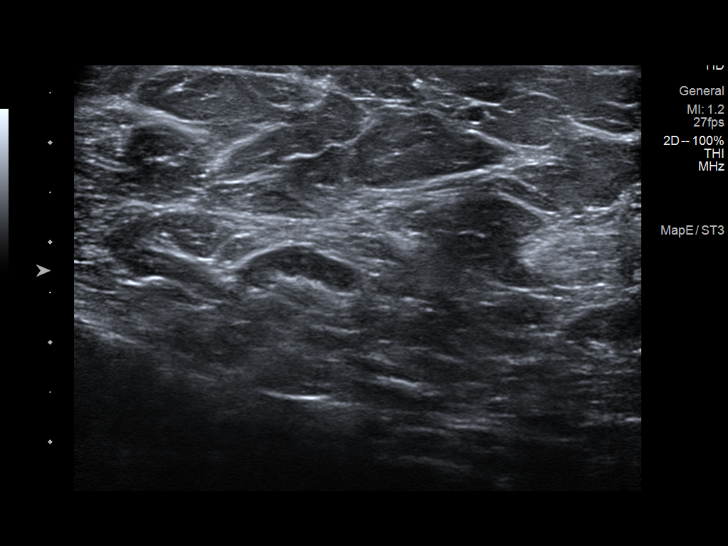

[11 of 11 positions shown; findings below may reference images not displayed]

FINDINGS: Targeted ultrasound of the upper inner right breast was performed.
There is an irregular hypoechoic mass at 1 o'clock 12 cm from nipple
measuring 0.7 x 0.5 x 0.6 cm. This corresponds well with the mass
seen in the right breast at mammography. No lymphadenopathy seen in
the right axilla.
IMPRESSION: Suspicious 0.7 cm mass in the right breast at the 1 o'clock
position.

RECOMMENDATION:
Recommend ultrasound-guided core biopsy of the mass in the right
breast at the 1 o'clock position.

I have discussed the findings and recommendations with the patient.
If applicable, a reminder letter will be sent to the patient
regarding the next appointment.

BI-RADS CATEGORY  4: Suspicious.

## 2022-04-29 ENCOUNTER — Other Ambulatory Visit (HOSPITAL_COMMUNITY): Payer: Self-pay

## 2022-05-07 ENCOUNTER — Other Ambulatory Visit (HOSPITAL_COMMUNITY): Payer: Self-pay

## 2022-05-07 ENCOUNTER — Inpatient Hospital Stay
Payer: No Typology Code available for payment source | Attending: Hematology and Oncology | Admitting: Gynecologic Oncology

## 2022-05-07 ENCOUNTER — Encounter: Payer: Self-pay | Admitting: Gynecologic Oncology

## 2022-05-07 VITALS — BP 126/85 | HR 90 | Temp 98.5°F | Resp 20 | Wt 254.0 lb

## 2022-05-07 DIAGNOSIS — Z1509 Genetic susceptibility to other malignant neoplasm: Secondary | ICD-10-CM

## 2022-05-07 DIAGNOSIS — Z1589 Genetic susceptibility to other disease: Secondary | ICD-10-CM

## 2022-05-07 DIAGNOSIS — Z170 Estrogen receptor positive status [ER+]: Secondary | ICD-10-CM

## 2022-05-07 DIAGNOSIS — C50211 Malignant neoplasm of upper-inner quadrant of right female breast: Secondary | ICD-10-CM

## 2022-05-07 DIAGNOSIS — Z1501 Genetic susceptibility to malignant neoplasm of breast: Secondary | ICD-10-CM

## 2022-05-07 MED ORDER — INFLUENZA VAC SPLIT QUAD 0.5 ML IM SUSY
0.5000 mL | PREFILLED_SYRINGE | INTRAMUSCULAR | 0 refills | Status: AC
  Filled 2022-05-07: qty 0.5, 1d supply, fill #0

## 2022-05-07 NOTE — Progress Notes (Signed)
Gynecologic Oncology Return Clinic Visit  05/07/22  Reason for Visit: Postoperative follow-up  Treatment History: Oncology History  Malignant neoplasm of upper-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, estrogen receptor positive ([place])  09/08/2020 Cancer Staging   Staging form: Breast, AJCC 8th Edition - Clinical stage from 09/08/2020: Stage IA (cT1b, cN0, cM0, G2, ER+, PR+, HER2+) - Signed by Gardenia Phlegm, NP on 09/20/2020 Stage prefix: Initial diagnosis   09/08/2020 Initial Diagnosis   Mammogram detected 0.7 cm mass in the right breast 1 o'clock position, right breast biopsy 1: Grade 2 IDC ER 95% PR 95%, Ki-67 30%, HER-2 positive ratio 2.83   10/16/2020 Surgery   Right lumpectomy [name]): IDC, 1.0cm, grade 3, with high grade DCIS, clear margins, 4 right axillary lymph nodes negative for carcinoma.   10/16/2020 Cancer Staging   Staging form: Breast, AJCC 8th Edition - Pathologic stage from 10/16/2020: Stage IA (pT1b, pN0, cM0, G3, ER+, PR+, HER2+) - Signed by Gardenia Phlegm, NP on 11/01/2020 Histologic grading system: 3 grade system   11/13/2020 - 12/14/2021 Chemotherapy   Patient is on Treatment Plan : BREAST Paclitaxel + Trastuzumab q7d / Trastuzumab q21d     02/15/2021 - 04/04/2021 Radiation Therapy   Site Technique Total Dose (Gy) Dose per Fx (Gy) Completed Fx Beam Energies  Breast, Right: Breast_Rt 3D 50.4/50.4 1.8 28/28 10X  Breast, Right: Breast_Rt_Bst 3D 10/10 2 5/5 6X, 10X     07/20/2021 -  Anti-estrogen oral therapy   Letrozole x 7 years   11/23/2021 Miscellaneous   Genetics (done at [employer]):  PALBS and MUTYH pathogenic mutations (additional VUS)    04/17/22: Robotic assisted BSO for PALB2 mutation, ER+ breast cancer  Interval History: Doing well.  Denies any significant abdominal or pelvic pain.  Has some occasional pain at the left upper quadrant incision.  Reports baseline bowel and bladder function.  Past Medical/Surgical History: Past  Medical History:  Diagnosis Date   Anxiety    Breast cancer in female [facility])    Right   Depression    GERD (gastroesophageal reflux disease)    Hypertension    Palpitations    Port-A-Cath in place 11/20/2020   Pre-diabetes    Sleep apnea    uses CPAP    Past Surgical History:  Procedure Laterality Date   BREAST LUMPECTOMY WITH RADIOACTIVE SEED AND SENTINEL LYMPH NODE BIOPSY Right 10/16/2020   Procedure: RIGHT BREAST LUMPECTOMY WITH RADIOACTIVE SEED AND SENTINEL LYMPH NODE BIOPSY;  Surgeon: [name], MD;  Location: [facility];  Service: General;  Laterality: Right;   BREAST SURGERY     PORT-A-CATH REMOVAL N/A 12/28/2021   Procedure: REMOVAL PORT-A-CATH;  Surgeon: [name], MD;  Location: [facility];  Service: General;  Laterality: N/A;   PORTACATH PLACEMENT Left 10/16/2020   Procedure: INSERTION PORT-A-CATH;  Surgeon: [name], MD;  Location: [facility];  Service: General;  Laterality: Left;   ROBOTIC ASSISTED BILATERAL SALPINGO OOPHERECTOMY Bilateral 04/17/2022   Procedure: XI ROBOTIC ASSISTED BILATERAL SALPINGO OOPHORECTOMY;  Surgeon: Lafonda Mosses, MD;  Location: [facility];  Service: Gynecology;  Laterality: Bilateral;   TUBAL LIGATION      Family History  Problem Relation Age of Onset   Heart disease Mother    Diabetes Mother    Hyperlipidemia Mother    Hypertension Mother    Kidney failure Mother    Breast cancer Mother    Sleep apnea Father    Diabetes Father    Hyperlipidemia Father  Hypertension Father    Prostate cancer Father    Diabetes Other    Stroke Other    Hypertension Other    Kidney failure Other    Coronary artery disease Other    Colon cancer Neg Hx    Ovarian cancer Neg Hx    Endometrial cancer Neg Hx    Pancreatic cancer Neg Hx     Social History   Socioeconomic History   Marital status: Single    Spouse name: Not on file   Number of children: 2   Years of education: Not on file   Highest education level: Not  on file  Occupational History   Occupation: medical records    Employer: [employer]  Tobacco Use   Smoking status: Former    Types: Cigarettes    Quit date: 07/29/1996    Years since quitting: 25.7   Smokeless tobacco: Never  Vaping Use   Vaping Use: Never used  Substance and Sexual Activity   Alcohol use: Yes    Comment: socially   Drug use: No   Sexual activity: Not Currently    Birth control/protection: Surgical  Other Topics Concern   Not on file  Social History Narrative   Not on file   Social Determinants of Health   Financial Resource Strain: Not on file  Food Insecurity: Not on file  Transportation Needs: Not on file  Physical Activity: Not on file  Stress: Not on file  Social Connections: Not on file    Current Medications:  Current Outpatient Medications:    ALPRAZolam (XANAX) 0.25 MG tablet, Take 1 tablet by mouth once a day if needed, Disp: 30 tablet, Rfl: 3   carvedilol (COREG) 3.125 MG tablet, Take 1 tablet by mouth 2 times daily., Disp: 60 tablet, Rfl: 3   citalopram (CELEXA) 10 MG tablet, TAKE 1 TABLET BY MOUTH ONCE DAILY, Disp: 90 tablet, Rfl: 3   hydrochlorothiazide (HYDRODIURIL) 25 MG tablet, TAKE 1 TABLET BY MOUTH ONCE DAILY, Disp: 90 tablet, Rfl: 3   letrozole (FEMARA) 2.5 MG tablet, Take 1 tablet by mouth daily., Disp: 90 tablet, Rfl: 3   losartan (COZAAR) 50 MG tablet, Take 1 tablet (50 mg total) by mouth daily., Disp: 90 tablet, Rfl: 3   metFORMIN (GLUCOPHAGE) 1000 MG tablet, Take 1,000 mg by mouth 2 (two) times daily with a meal., Disp: , Rfl:    Multiple Vitamins-Minerals (MULTIVITAMIN WITH MINERALS) tablet, Take 1 tablet by mouth daily., Disp: , Rfl:    NON FORMULARY, Pt uses a cpap nightly, Disp: , Rfl:    pantoprazole (PROTONIX) 40 MG tablet, Take 1 tablet by mouth once daily., Disp: 30 tablet, Rfl: 11   rosuvastatin (CRESTOR) 10 MG tablet, Take 1 tablet (10 mg total) by mouth daily., Disp: 90 tablet, Rfl: 3   spironolactone (ALDACTONE) 50 MG  tablet, Take 1 tablet (50 mg total) by mouth at bedtime., Disp: 60 tablet, Rfl: 11  Review of Systems: Denies appetite changes, fevers, chills, fatigue, unexplained weight changes. Denies hearing loss, neck lumps or masses, mouth sores, ringing in ears or voice changes. Denies cough or wheezing.  Denies shortness of breath. Denies chest pain or palpitations. Denies leg swelling. Denies abdominal distention, pain, blood in stools, constipation, diarrhea, nausea, vomiting, or early satiety. Denies pain with intercourse, dysuria, frequency, hematuria or incontinence. Denies hot flashes, pelvic pain, vaginal bleeding or vaginal discharge.   Denies joint pain, back pain or muscle pain/cramps. Denies itching, rash, or wounds. Denies dizziness, headaches, numbness  or seizures. Denies swollen lymph nodes or glands, denies easy bruising or bleeding. Denies anxiety, depression, confusion, or decreased concentration.  Physical Exam: BP 126/85   Pulse 90   Temp 98.5 F (36.9 C) (Oral)   Resp 20   Wt 254 lb (115.2 kg)   LMP 12/22/2017   BMI 41.00 kg/m  General: Alert, oriented, no acute distress. HEENT: Normocephalic, atraumatic, sclera anicteric. Chest: Unlabored breathing on room air. Abdomen: Obese, soft, nontender.  Normoactive bowel sounds.  No masses or hepatosplenomegaly appreciated.  Well-healed incisions.  Remaining Dermabond removed from right lateral incision as well as mattress sutures excised. Extremities: Grossly normal range of motion.  Warm, well perfused.  No edema bilaterally.  Laboratory & Radiologic Studies: A. FALLOPIAN TUBE AND OVARY, LEFT, SALPINGO OOPHORECTOMY:   - UNREMARKABLE OVARY.   - BENIGN FALLOPIAN TUBE WITH PREVIOUS TUBAL LIGATION.   - NEGATIVE FOR ENDOMETRIOSIS OR MALIGNANCY.   B. FALLOPIAN TUBE AND OVARY, RIGHT, SALPINGO OOPHORECTOMY:   - UNREMARKABLE OVARY.   - BENIGN FALLOPIAN TUBE WITH PREVIOUS TUBAL LIGATION.   - NEGATIVE FOR ENDOMETRIOSIS OR  MALIGNANCY.   A. PELVIC, WASHING:  FINAL MICROSCOPIC DIAGNOSIS:  - No malignant cells identified   Assessment & Plan: Grace Pennington is a 51 y.o. woman with PALB2 mutation, ER+ breast cancer now status post risk-reducing and therapeutic BSO.  Patient is overall doing well postoperatively.  Discussed continued expectations and restrictions.  Reviewed pathology from surgery.  Patient was given a handout of her final pathology report.  Discussed continued follow-up with her medical oncologist and PCP.  Reviewed small but present risk of primary peritoneal cancer and signs and symptoms that should prompt a phone call.  14 minutes of total time was spent for this patient encounter, including preparation, face-to-face counseling with the patient and coordination of care, and documentation of the encounter.  Jeral Pinch, MD  Division of Gynecologic Oncology  Department of Obstetrics and Gynecology  [facility]

## 2022-05-07 NOTE — Patient Instructions (Signed)
It was good to see you today.  You are healing well from surgery.  Remember, no heavy lifting for 6 weeks after surgery.  Please don't hesitate to reach out to me in the future if you need anything.

## 2022-05-08 ENCOUNTER — Encounter: Payer: Self-pay | Admitting: Hematology and Oncology

## 2022-05-20 ENCOUNTER — Other Ambulatory Visit (HOSPITAL_COMMUNITY): Payer: Self-pay

## 2022-05-20 MED ORDER — METFORMIN HCL 1000 MG PO TABS
1000.0000 mg | ORAL_TABLET | Freq: Two times a day (BID) | ORAL | 3 refills | Status: DC
  Filled 2022-05-20: qty 180, 90d supply, fill #0

## 2022-05-23 NOTE — Progress Notes (Incomplete)
Patient Care Team: Seward Carol, MD as PCP - General (Internal Medicine) Nicholas Lose, MD as Consulting Physician (Hematology and Oncology) [name], MD as Consulting Physician (General Surgery) Kyung Rudd, MD as Consulting Physician (Radiation Oncology) Armandina Stammer, DO as Consulting Physician (Obstetrics and Gynecology)  DIAGNOSIS: No diagnosis found.  SUMMARY OF ONCOLOGIC HISTORY: Oncology History  Malignant neoplasm of upper-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, estrogen receptor positive ([place])  09/08/2020 Cancer Staging   Staging form: Breast, AJCC 8th Edition - Clinical stage from 09/08/2020: Stage IA (cT1b, cN0, cM0, G2, ER+, PR+, HER2+) - Signed by Gardenia Phlegm, NP on 09/20/2020 Stage prefix: Initial diagnosis   09/08/2020 Initial Diagnosis   Mammogram detected 0.7 cm mass in the right breast 1 o'clock position, right breast biopsy 1: Grade 2 IDC ER 95% PR 95%, Ki-67 30%, HER-2 positive ratio 2.83   10/16/2020 Surgery   Right lumpectomy [name]): IDC, 1.0cm, grade 3, with high grade DCIS, clear margins, 4 right axillary lymph nodes negative for carcinoma.   10/16/2020 Cancer Staging   Staging form: Breast, AJCC 8th Edition - Pathologic stage from 10/16/2020: Stage IA (pT1b, pN0, cM0, G3, ER+, PR+, HER2+) - Signed by Gardenia Phlegm, NP on 11/01/2020 Histologic grading system: 3 grade system   11/13/2020 - 12/14/2021 Chemotherapy   Patient is on Treatment Plan : BREAST Paclitaxel + Trastuzumab q7d / Trastuzumab q21d     02/15/2021 - 04/04/2021 Radiation Therapy   Site Technique Total Dose (Gy) Dose per Fx (Gy) Completed Fx Beam Energies  Breast, Right: Breast_Rt 3D 50.4/50.4 1.8 28/28 10X  Breast, Right: Breast_Rt_Bst 3D 10/10 2 5/5 6X, 10X     07/20/2021 -  Anti-estrogen oral therapy   Letrozole x 7 years   11/23/2021 Miscellaneous   Genetics (done at [employer] [employer]):  PALBS and MUTYH pathogenic mutations (additional VUS)     CHIEF  COMPLIANT: Follow-up herceptin  INTERVAL HISTORY: Grace Pennington is a 51 y.o. with above-mentioned history of right breast cancer who underwent a right lumpectomy and is currently on Herceptin. She presents to the clinic today for treatment.   ALLERGIES:  has No Known Allergies.  MEDICATIONS:  Current Outpatient Medications  Medication Sig Dispense Refill   ALPRAZolam (XANAX) 0.25 MG tablet Take 1 tablet by mouth once a day if needed 30 tablet 3   carvedilol (COREG) 3.125 MG tablet Take 1 tablet by mouth 2 times daily. 60 tablet 3   citalopram (CELEXA) 10 MG tablet TAKE 1 TABLET BY MOUTH ONCE DAILY 90 tablet 3   hydrochlorothiazide (HYDRODIURIL) 25 MG tablet TAKE 1 TABLET BY MOUTH ONCE DAILY 90 tablet 3   influenza vac split quadrivalent PF (FLUARIX) 0.5 ML injection Inject 0.5 mLs into the muscle. 0.5 mL 0   letrozole (FEMARA) 2.5 MG tablet Take 1 tablet by mouth daily. 90 tablet 3   losartan (COZAAR) 50 MG tablet Take 1 tablet (50 mg total) by mouth daily. 90 tablet 3   metFORMIN (GLUCOPHAGE) 1000 MG tablet Take 1,000 mg by mouth 2 (two) times daily with a meal.     metFORMIN (GLUCOPHAGE) 1000 MG tablet Take 1 tablet (1,000 mg total) by mouth 2 (two) times daily with a meal. 180 tablet 3   Multiple Vitamins-Minerals (MULTIVITAMIN WITH MINERALS) tablet Take 1 tablet by mouth daily.     NON FORMULARY Pt uses a cpap nightly     pantoprazole (PROTONIX) 40 MG tablet Take 1 tablet by mouth once daily. 30 tablet 11  rosuvastatin (CRESTOR) 10 MG tablet Take 1 tablet (10 mg total) by mouth daily. 90 tablet 3   spironolactone (ALDACTONE) 50 MG tablet Take 1 tablet (50 mg total) by mouth at bedtime. 60 tablet 11   No current facility-administered medications for this visit.    PHYSICAL EXAMINATION: ECOG PERFORMANCE STATUS: {CHL ONC ECOG PS:[phone]}  There were no vitals filed for this visit. There were no vitals filed for this visit.  BREAST:*** No palpable masses or nodules in either  right or left breasts. No palpable axillary supraclavicular or infraclavicular adenopathy no breast tenderness or nipple discharge. (exam performed in the presence of a chaperone)  LABORATORY DATA:  I have reviewed the data as listed    Latest Ref Rng & Units 04/11/2022    9:39 AM 04/09/2022   10:30 AM 12/26/2021    9:17 AM  CMP  Glucose 70 - 99 mg/dL 104  128  129   BUN 6 - 20 mg/dL 15  15  14    Creatinine 0.44 - 1.00 mg/dL 0.98  0.86  0.94   Sodium 135 - 145 mmol/L 139  140  140   Potassium 3.5 - 5.1 mmol/L 3.9  3.8  3.9   Chloride 98 - 111 mmol/L 105  108  106   CO2 22 - 32 mmol/L 26  26  28    Calcium 8.9 - 10.3 mg/dL 9.6  9.7  9.2   Total Protein 6.5 - 8.1 g/dL  8.3    Total Bilirubin 0.3 - 1.2 mg/dL  0.5    Alkaline Phos 38 - 126 U/L  50    AST 15 - 41 U/L  21    ALT 0 - 44 U/L  24      Lab Results  Component Value Date   WBC 5.4 04/09/2022   HGB 13.4 04/09/2022   HCT 41.1 04/09/2022   MCV 87.1 04/09/2022   PLT 299 04/09/2022   NEUTROABS 2.9 10/12/2021    ASSESSMENT & PLAN:  No problem-specific Assessment & Plan notes found for this encounter.    No orders of the defined types were placed in this encounter.  The patient has a good understanding of the overall plan. she agrees with it. she will call with any problems that may develop before the next visit here. Total time spent: 30 mins including face to face time and time spent for planning, charting and co-ordination of care   Suzzette Righter, [place] 05/23/22    I Gardiner Coins am scribing for Dr. Lindi Adie  ***

## 2022-05-24 ENCOUNTER — Inpatient Hospital Stay: Payer: No Typology Code available for payment source | Admitting: Hematology and Oncology

## 2022-05-24 NOTE — Assessment & Plan Note (Deleted)
09/08/2020:Mammogram detected 0.7 cm mass in the right breast 1 o'clock position, right breast biopsy 1: Grade 2 IDC ER 95% PR 95%, Ki-67 30%, HER-2 positive ratio 2.83 T1b N0 stage Ia  10/16/20: Grade 3 IDC with DCIS 0/4 LNER 95% PR 95%, Ki-67 30%, HER-2 positive ratio 2.83  Treatment Plan: 1.adjuvant Taxol Herceptin followed by Herceptin maintenance for 1 yearplan to start in 3 weekscompleted 02/02/2021, Herceptin maintenance completed 12/14/2021 2.Adjuvant radiationcompleted 04/04/2021 3.Followed by adjuvant antiestrogen therapywith letrozole started 07/20/2021 (patient went to menopause at the age of 69) -------------------------------------------------------------------------------------------------------------------------- Current treatment: Letrozole started 07/20/2021  Letrozoletoxicities: Denies any adverse effects Body aches and pains: I instructed her to take turmeric. Planned treatment duration is 7years.  Bone density: 08/30/2021: Osteoporosis T score -2.5: We will add Reclast infusion to her next Herceptin treatment. She will receive this once a year. She takes calcium and vitamin D.  Mammograms:January 2023: Benign  PALB2 and MUTYH mutations:  1.  Breast cancer surveillance: 01/28/2022: Breast MRI: 1.4 cm non-mass enhancement and 7 mm indeterminate enhancing focus right breast.    Biopsy: Benign fibrosis negative for malignancy 2. bilateral salpingo-oophorectomy with Dr. Berline Lopes on 04/17/2022  3.  Needs colonoscopy for colon cancer surveillance   Return to clinic in 1 year for follow-up.

## 2022-06-08 NOTE — Progress Notes (Signed)
Patient Care Team: Seward Carol, MD as PCP - General (Internal Medicine) Nicholas Lose, MD as Consulting Physician (Hematology and Oncology) [name], MD as Consulting Physician (General Surgery) Kyung Rudd, MD as Consulting Physician (Radiation Oncology) Armandina Stammer, DO as Consulting Physician (Obstetrics and Gynecology)  DIAGNOSIS: No diagnosis found.  SUMMARY OF ONCOLOGIC HISTORY: Oncology History  Malignant neoplasm of upper-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, estrogen receptor positive ([place])  09/08/2020 Cancer Staging   Staging form: Breast, AJCC 8th Edition - Clinical stage from 09/08/2020: Stage IA (cT1b, cN0, cM0, G2, ER+, PR+, HER2+) - Signed by Gardenia Phlegm, NP on 09/20/2020 Stage prefix: Initial diagnosis   09/08/2020 Initial Diagnosis   Mammogram detected 0.7 cm mass in the right breast 1 o'clock position, right breast biopsy 1: Grade 2 IDC ER 95% PR 95%, Ki-67 30%, HER-2 positive ratio 2.83   10/16/2020 Surgery   Right lumpectomy [name]): IDC, 1.0cm, grade 3, with high grade DCIS, clear margins, 4 right axillary lymph nodes negative for carcinoma.   10/16/2020 Cancer Staging   Staging form: Breast, AJCC 8th Edition - Pathologic stage from 10/16/2020: Stage IA (pT1b, pN0, cM0, G3, ER+, PR+, HER2+) - Signed by Gardenia Phlegm, NP on 11/01/2020 Histologic grading system: 3 grade system   11/13/2020 - 12/14/2021 Chemotherapy   Patient is on Treatment Plan : BREAST Paclitaxel + Trastuzumab q7d / Trastuzumab q21d     02/15/2021 - 04/04/2021 Radiation Therapy   Site Technique Total Dose (Gy) Dose per Fx (Gy) Completed Fx Beam Energies  Breast, Right: Breast_Rt 3D 50.4/50.4 1.8 28/28 10X  Breast, Right: Breast_Rt_Bst 3D 10/10 2 5/5 6X, 10X     07/20/2021 -  Anti-estrogen oral therapy   Letrozole x 7 years   11/23/2021 Miscellaneous   Genetics (done at [facility]):  PALBS and MUTYH pathogenic mutations (additional VUS)     CHIEF  COMPLAINT:  right breast cancer letrozole follow-up  INTERVAL HISTORY: Grace Pennington is a  is a 51 y.o. with above-mentioned history of right breast cancer who underwent a right lumpectomy and is currently on Herceptin. She presents to the clinic today for a follow-up.   ALLERGIES:  has No Known Allergies.  MEDICATIONS:  Current Outpatient Medications  Medication Sig Dispense Refill   ALPRAZolam (XANAX) 0.25 MG tablet Take 1 tablet by mouth once a day if needed 30 tablet 3   carvedilol (COREG) 3.125 MG tablet Take 1 tablet by mouth 2 times daily. 60 tablet 3   citalopram (CELEXA) 10 MG tablet TAKE 1 TABLET BY MOUTH ONCE DAILY 90 tablet 3   hydrochlorothiazide (HYDRODIURIL) 25 MG tablet TAKE 1 TABLET BY MOUTH ONCE DAILY 90 tablet 3   influenza vac split quadrivalent PF (FLUARIX) 0.5 ML injection Inject 0.5 mLs into the muscle. 0.5 mL 0   letrozole (FEMARA) 2.5 MG tablet Take 1 tablet by mouth daily. 90 tablet 3   losartan (COZAAR) 50 MG tablet Take 1 tablet (50 mg total) by mouth daily. 90 tablet 3   metFORMIN (GLUCOPHAGE) 1000 MG tablet Take 1,000 mg by mouth 2 (two) times daily with a meal.     metFORMIN (GLUCOPHAGE) 1000 MG tablet Take 1 tablet (1,000 mg total) by mouth 2 (two) times daily with a meal. 180 tablet 3   Multiple Vitamins-Minerals (MULTIVITAMIN WITH MINERALS) tablet Take 1 tablet by mouth daily.     NON FORMULARY Pt uses a cpap nightly     pantoprazole (PROTONIX) 40 MG tablet Take 1  tablet by mouth once daily. 30 tablet 11   rosuvastatin (CRESTOR) 10 MG tablet Take 1 tablet (10 mg total) by mouth daily. 90 tablet 3   spironolactone (ALDACTONE) 50 MG tablet Take 1 tablet (50 mg total) by mouth at bedtime. 60 tablet 11   No current facility-administered medications for this visit.    PHYSICAL EXAMINATION: ECOG PERFORMANCE STATUS: {CHL ONC ECOG PS:[phone]}  There were no vitals filed for this visit. There were no vitals filed for this visit.  BREAST:*** No palpable  masses or nodules in either right or left breasts. No palpable axillary supraclavicular or infraclavicular adenopathy no breast tenderness or nipple discharge. (exam performed in the presence of a chaperone)  LABORATORY DATA:  I have reviewed the data as listed    Latest Ref Rng & Units 04/11/2022    9:39 AM 04/09/2022   10:30 AM 12/26/2021    9:17 AM  CMP  Glucose 70 - 99 mg/dL 104  128  129   BUN 6 - 20 mg/dL _0 Creatinine 0.44 - 1.00 mg/dL 0.98  0.86  0.94   Sodium 135 - 145 mmol/L 139  140  140   Potassium 3.5 - 5.1 mmol/L 3.9  3.8  3.9   Chloride 98 - 111 mmol/L 105  108  106   CO2 22 - 32 mmol/L _1 Calcium 8.9 - 10.3 mg/dL 9.6  9.7  9.2   Total Protein 6.5 - 8.1 g/dL  8.3    Total Bilirubin 0.3 - 1.2 mg/dL  0.5    Alkaline Phos 38 - 126 U/L  50    AST 15 - 41 U/L  21    ALT 0 - 44 U/L  24      Lab Results  Component Value Date   WBC 5.4 04/09/2022   HGB 13.4 04/09/2022   HCT 41.1 04/09/2022   MCV 87.1 04/09/2022   PLT 299 04/09/2022   NEUTROABS 2.9 10/12/2021    ASSESSMENT & PLAN:  No problem-specific Assessment & Plan notes found for this encounter.    No orders of the defined types were placed in this encounter.  The patient has a good understanding of the overall plan. she agrees with it. she will call with any problems that may develop before the next visit here. Total time spent: 30 mins including face to face time and time spent for planning, charting and co-ordination of care   Suzzette Righter, [place] 06/08/22    I Gardiner Coins am scribing for Dr. Lindi Adie  ***

## 2022-06-10 ENCOUNTER — Other Ambulatory Visit: Payer: Self-pay

## 2022-06-10 ENCOUNTER — Other Ambulatory Visit (HOSPITAL_COMMUNITY): Payer: Self-pay

## 2022-06-10 ENCOUNTER — Inpatient Hospital Stay
Payer: No Typology Code available for payment source | Attending: Hematology and Oncology | Admitting: Hematology and Oncology

## 2022-06-10 VITALS — BP 117/77 | HR 65 | Temp 97.3°F | Resp 18 | Ht 66.0 in | Wt 251.1 lb

## 2022-06-10 DIAGNOSIS — R232 Flushing: Secondary | ICD-10-CM | POA: Insufficient documentation

## 2022-06-10 DIAGNOSIS — M810 Age-related osteoporosis without current pathological fracture: Secondary | ICD-10-CM | POA: Diagnosis not present

## 2022-06-10 DIAGNOSIS — C50211 Malignant neoplasm of upper-inner quadrant of right female breast: Secondary | ICD-10-CM | POA: Diagnosis present

## 2022-06-10 DIAGNOSIS — Z79811 Long term (current) use of aromatase inhibitors: Secondary | ICD-10-CM | POA: Insufficient documentation

## 2022-06-10 DIAGNOSIS — Z170 Estrogen receptor positive status [ER+]: Secondary | ICD-10-CM | POA: Diagnosis not present

## 2022-06-10 MED ORDER — LETROZOLE 2.5 MG PO TABS
2.5000 mg | ORAL_TABLET | Freq: Every day | ORAL | 3 refills | Status: DC
  Filled 2022-06-10 – 2022-07-24 (×3): qty 90, 90d supply, fill #0
  Filled 2022-10-19: qty 90, 90d supply, fill #1
  Filled 2022-11-21 – 2023-01-20 (×2): qty 90, 90d supply, fill #2
  Filled 2023-03-10 – 2023-04-27 (×2): qty 90, 90d supply, fill #3

## 2022-06-10 NOTE — Assessment & Plan Note (Addendum)
09/08/2020:Mammogram detected 0.7 cm mass in the right breast 1 o'clock position, right breast biopsy 1: Grade 2 IDC ER 95% PR 95%, Ki-67 30%, HER-2 positive ratio 2.83 T1b N0 stage Ia   10/16/20: Grade 3 IDC with DCIS 0/4 LN  ER 95% PR 95%, Ki-67 30%, HER-2 positive ratio 2.83   Treatment Plan: 1. adjuvant Taxol Herceptin followed by Herceptin maintenance for 1 year plan to start in 3 weeks completed 12/14/2021 2.  Adjuvant radiation completed 04/04/2021 3.  Followed by adjuvant antiestrogen therapy with letrozole started 07/20/2021 (patient went to menopause at the age of 84) -------------------------------------------------------------------------------------------------------------------------- Current treatment: Letrozole started 07/20/2021   Letrozole toxicities:  Body aches and pains: I instructed her to take turmeric. Hot flashes mild to moderate Planned treatment duration is 7 years.   Bone density: 08/30/2021: Osteoporosis T score -2.5: She takes calcium and vitamin D.  Needs bisphosphonate therapy   Breast cancer surveillance: Mammograms: January 2023: Benign Breast exam 06/10/2022: Benign Breast MRI 01/30/2022: Left breast: 1.4 cm patchy non-mass enhancement extending from the lumpectomy seroma, 7 mm indeterminate mass right breast: Right breast biopsy 02/22/2022: Fibrous scar  PALB2 and MUTYH mutations: Annual breast MRIs in July 04/17/2022: By lateral salpingo-oophorectomy: Benign    Return to clinic in 1 year for follow-up.

## 2022-06-11 ENCOUNTER — Other Ambulatory Visit (HOSPITAL_COMMUNITY): Payer: Self-pay

## 2022-06-11 ENCOUNTER — Other Ambulatory Visit (HOSPITAL_COMMUNITY): Payer: Self-pay | Admitting: Internal Medicine

## 2022-06-11 MED ORDER — CARVEDILOL 3.125 MG PO TABS
3.1250 mg | ORAL_TABLET | Freq: Two times a day (BID) | ORAL | 3 refills | Status: DC
  Filled 2022-06-11: qty 60, 30d supply, fill #0
  Filled 2022-09-11: qty 60, 30d supply, fill #1
  Filled 2022-10-19: qty 60, 30d supply, fill #2
  Filled 2022-11-15: qty 60, 30d supply, fill #3

## 2022-06-24 ENCOUNTER — Encounter: Payer: Self-pay | Admitting: *Deleted

## 2022-07-02 ENCOUNTER — Other Ambulatory Visit (HOSPITAL_COMMUNITY): Payer: Self-pay | Admitting: Internal Medicine

## 2022-07-02 ENCOUNTER — Other Ambulatory Visit (HOSPITAL_COMMUNITY): Payer: Self-pay

## 2022-07-03 ENCOUNTER — Other Ambulatory Visit (HOSPITAL_COMMUNITY): Payer: Self-pay

## 2022-07-04 ENCOUNTER — Other Ambulatory Visit (HOSPITAL_COMMUNITY): Payer: Self-pay

## 2022-07-08 ENCOUNTER — Ambulatory Visit: Payer: 59

## 2022-07-13 ENCOUNTER — Other Ambulatory Visit (HOSPITAL_COMMUNITY): Payer: Self-pay

## 2022-07-24 ENCOUNTER — Other Ambulatory Visit: Payer: Self-pay | Admitting: Hematology and Oncology

## 2022-07-24 ENCOUNTER — Other Ambulatory Visit: Payer: Self-pay

## 2022-07-24 ENCOUNTER — Other Ambulatory Visit (HOSPITAL_COMMUNITY): Payer: Self-pay

## 2022-07-24 DIAGNOSIS — Z853 Personal history of malignant neoplasm of breast: Secondary | ICD-10-CM

## 2022-07-24 DIAGNOSIS — Z9889 Other specified postprocedural states: Secondary | ICD-10-CM

## 2022-07-30 ENCOUNTER — Encounter: Payer: Self-pay | Admitting: Rehabilitation

## 2022-07-30 ENCOUNTER — Ambulatory Visit: Payer: 59 | Attending: General Surgery | Admitting: Rehabilitation

## 2022-07-30 DIAGNOSIS — Z483 Aftercare following surgery for neoplasm: Secondary | ICD-10-CM | POA: Insufficient documentation

## 2022-07-30 NOTE — Therapy (Signed)
  OUTPATIENT PHYSICAL THERAPY SOZO SCREENING NOTE   Patient Name: Grace Pennington MRN: [id number] DOB:May 10, 1971, 52 y.o., female Today's Date: 07/30/2022  PCP: Seward Carol, MD REFERRING PROVIDER: [name], MD   PT End of Session - 07/30/22 0950     Visit Number 7   screen only   PT Start Time 0904    PT Stop Time 0908    PT Time Calculation (min) 4 min    Activity Tolerance Patient tolerated treatment well    Behavior During Therapy [facility] for tasks assessed/performed              Past Medical History:  Diagnosis Date   Anxiety    Breast cancer in female [facility])    Right   Depression    GERD (gastroesophageal reflux disease)    Hypertension    Palpitations    Port-A-Cath in place 11/20/2020   Pre-diabetes    Sleep apnea    uses CPAP   Past Surgical History:  Procedure Laterality Date   BREAST LUMPECTOMY WITH RADIOACTIVE SEED AND SENTINEL LYMPH NODE BIOPSY Right 10/16/2020   Procedure: RIGHT BREAST LUMPECTOMY WITH RADIOACTIVE SEED AND SENTINEL LYMPH NODE BIOPSY;  Surgeon: [name], MD;  Location: [facility];  Service: General;  Laterality: Right;   BREAST SURGERY     PORT-A-CATH REMOVAL N/A 12/28/2021   Procedure: REMOVAL PORT-A-CATH;  Surgeon: [name], MD;  Location: [facility];  Service: General;  Laterality: N/A;   PORTACATH PLACEMENT Left 10/16/2020   Procedure: INSERTION PORT-A-CATH;  Surgeon: [name], MD;  Location: [facility];  Service: General;  Laterality: Left;   ROBOTIC ASSISTED BILATERAL SALPINGO OOPHERECTOMY Bilateral 04/17/2022   Procedure: XI ROBOTIC ASSISTED BILATERAL SALPINGO OOPHORECTOMY;  Surgeon: Lafonda Mosses, MD;  Location: [facility];  Service: Gynecology;  Laterality: Bilateral;   TUBAL LIGATION     Patient Active Problem List   Diagnosis Date Noted   Monoallelic mutation of PALB2 gene    Malignant neoplasm of upper-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, estrogen receptor positive ([place]) 09/20/2020    Precordial pain 04/14/2014   Essential hypertension, benign 08/03/2013   Family history of ischemic heart disease 08/03/2013    REFERRING DIAG: right breast cancer at risk for lymphedema  THERAPY DIAG: Aftercare following surgery for neoplasm  PERTINENT HISTORY: Rt lumpectomy and SLNB on 10/16/20 with Dr. [name] 0/4 nodes positive due to ER+ breast cancer with chemotherapy and radiation, completed radiation on 9/7,  HTN   PRECAUTIONS: right UE Lymphedema risk, None  SUBJECTIVE: Pt returns for her 3 month L-Dex screen.   PAIN:  Are you having pain? No  SOZO SCREENING: Patient was assessed today using the SOZO machine to determine the lymphedema index score. This was compared to her baseline score. It was determined that she is within the recommended range when compared to her baseline and no further action is needed at this time. She will continue SOZO screenings. These are done every 3 months for 2 years post operatively followed by every 6 months for 2 years, and then annually.    Stark Bray, PT 07/30/2022, 9:52 AM

## 2022-08-12 DIAGNOSIS — E119 Type 2 diabetes mellitus without complications: Secondary | ICD-10-CM | POA: Diagnosis not present

## 2022-09-03 ENCOUNTER — Other Ambulatory Visit: Payer: Self-pay

## 2022-09-10 ENCOUNTER — Other Ambulatory Visit (HOSPITAL_COMMUNITY): Payer: Self-pay

## 2022-09-10 MED ORDER — AMOXICILLIN 500 MG PO CAPS
500.0000 mg | ORAL_CAPSULE | Freq: Three times a day (TID) | ORAL | 1 refills | Status: DC
  Filled 2022-09-10: qty 21, 7d supply, fill #0

## 2022-09-11 ENCOUNTER — Other Ambulatory Visit (HOSPITAL_COMMUNITY): Payer: Self-pay

## 2022-09-11 ENCOUNTER — Other Ambulatory Visit: Payer: Self-pay

## 2022-09-11 MED ORDER — METFORMIN HCL 1000 MG PO TABS
1000.0000 mg | ORAL_TABLET | Freq: Two times a day (BID) | ORAL | 3 refills | Status: AC
  Filled 2022-09-11: qty 180, 90d supply, fill #0
  Filled 2022-11-21 – 2022-11-27 (×2): qty 180, 90d supply, fill #1

## 2022-09-11 MED ORDER — HYDROCHLOROTHIAZIDE 25 MG PO TABS
25.0000 mg | ORAL_TABLET | Freq: Every day | ORAL | 3 refills | Status: DC
  Filled 2022-09-11: qty 90, 90d supply, fill #0

## 2022-09-17 ENCOUNTER — Other Ambulatory Visit (HOSPITAL_COMMUNITY): Payer: Self-pay

## 2022-09-25 ENCOUNTER — Other Ambulatory Visit (HOSPITAL_COMMUNITY): Payer: Self-pay

## 2022-09-25 ENCOUNTER — Other Ambulatory Visit: Payer: Self-pay | Admitting: Hematology and Oncology

## 2022-09-25 ENCOUNTER — Ambulatory Visit
Admission: RE | Admit: 2022-09-25 | Discharge: 2022-09-25 | Disposition: A | Discharge status: Home / Self Care | Payer: 59 | Source: Ambulatory Visit | Attending: Hematology and Oncology | Admitting: Hematology and Oncology

## 2022-09-25 ENCOUNTER — Other Ambulatory Visit: Payer: Self-pay

## 2022-09-25 DIAGNOSIS — R921 Mammographic calcification found on diagnostic imaging of breast: Secondary | ICD-10-CM | POA: Diagnosis not present

## 2022-09-25 DIAGNOSIS — Z9889 Other specified postprocedural states: Secondary | ICD-10-CM

## 2022-09-25 DIAGNOSIS — Z853 Personal history of malignant neoplasm of breast: Secondary | ICD-10-CM

## 2022-09-25 MED ORDER — CITALOPRAM HYDROBROMIDE 10 MG PO TABS
10.0000 mg | ORAL_TABLET | Freq: Every day | ORAL | 1 refills | Status: DC
  Filled 2022-09-25: qty 90, 90d supply, fill #0
  Filled 2022-12-25: qty 90, 90d supply, fill #1

## 2022-09-26 ENCOUNTER — Other Ambulatory Visit (HOSPITAL_COMMUNITY): Payer: Self-pay

## 2022-09-30 ENCOUNTER — Encounter: Payer: Self-pay | Admitting: Hematology and Oncology

## 2022-09-30 ENCOUNTER — Other Ambulatory Visit: Payer: Self-pay | Admitting: *Deleted

## 2022-09-30 ENCOUNTER — Other Ambulatory Visit: Payer: Self-pay | Admitting: Hematology and Oncology

## 2022-09-30 DIAGNOSIS — Z170 Estrogen receptor positive status [ER+]: Secondary | ICD-10-CM

## 2022-09-30 NOTE — Progress Notes (Signed)
Received message from pt requesting to proceed with [place] and biopsy of right breast due to recent calcifications found on diagnostic mammogram. RN reviewed with MD and verbal orders received and placed.

## 2022-10-02 ENCOUNTER — Telehealth: Payer: Self-pay | Admitting: Adult Health

## 2022-10-02 NOTE — Telephone Encounter (Signed)
Scheduled appointment per staff message. Patient is aware of the made appointment.

## 2022-10-07 DIAGNOSIS — G4733 Obstructive sleep apnea (adult) (pediatric): Secondary | ICD-10-CM | POA: Diagnosis not present

## 2022-10-08 ENCOUNTER — Other Ambulatory Visit: Payer: Self-pay | Admitting: Hematology and Oncology

## 2022-10-08 ENCOUNTER — Ambulatory Visit
Admission: RE | Admit: 2022-10-08 | Discharge: 2022-10-08 | Disposition: A | Discharge status: Home / Self Care | Payer: 59 | Source: Ambulatory Visit | Attending: Hematology and Oncology | Admitting: Hematology and Oncology

## 2022-10-08 DIAGNOSIS — Z170 Estrogen receptor positive status [ER+]: Secondary | ICD-10-CM

## 2022-10-08 DIAGNOSIS — C50211 Malignant neoplasm of upper-inner quadrant of right female breast: Secondary | ICD-10-CM

## 2022-10-09 ENCOUNTER — Ambulatory Visit
Admission: RE | Admit: 2022-10-09 | Discharge: 2022-10-09 | Disposition: A | Discharge status: Home / Self Care | Payer: 59 | Source: Ambulatory Visit | Attending: Hematology and Oncology | Admitting: Hematology and Oncology

## 2022-10-09 DIAGNOSIS — R921 Mammographic calcification found on diagnostic imaging of breast: Secondary | ICD-10-CM | POA: Diagnosis not present

## 2022-10-09 DIAGNOSIS — Z170 Estrogen receptor positive status [ER+]: Secondary | ICD-10-CM

## 2022-10-09 HISTORY — PX: BREAST BIOPSY: SHX20

## 2022-10-10 ENCOUNTER — Other Ambulatory Visit (HOSPITAL_COMMUNITY): Payer: Self-pay

## 2022-10-10 DIAGNOSIS — E1169 Type 2 diabetes mellitus with other specified complication: Secondary | ICD-10-CM | POA: Diagnosis not present

## 2022-10-10 DIAGNOSIS — I10 Essential (primary) hypertension: Secondary | ICD-10-CM | POA: Diagnosis not present

## 2022-10-10 DIAGNOSIS — G4733 Obstructive sleep apnea (adult) (pediatric): Secondary | ICD-10-CM | POA: Diagnosis not present

## 2022-10-10 DIAGNOSIS — Z0000 Encounter for general adult medical examination without abnormal findings: Secondary | ICD-10-CM | POA: Diagnosis not present

## 2022-10-10 DIAGNOSIS — E7800 Pure hypercholesterolemia, unspecified: Secondary | ICD-10-CM | POA: Diagnosis not present

## 2022-10-10 DIAGNOSIS — C50211 Malignant neoplasm of upper-inner quadrant of right female breast: Secondary | ICD-10-CM | POA: Diagnosis not present

## 2022-10-10 MED ORDER — LOSARTAN POTASSIUM 25 MG PO TABS
25.0000 mg | ORAL_TABLET | Freq: Every day | ORAL | 3 refills | Status: AC
  Filled 2022-10-10: qty 90, 90d supply, fill #0
  Filled 2023-03-10: qty 90, 90d supply, fill #1

## 2022-10-11 ENCOUNTER — Other Ambulatory Visit: Payer: Self-pay

## 2022-10-14 ENCOUNTER — Telehealth: Payer: Self-pay

## 2022-10-14 ENCOUNTER — Encounter: Payer: Self-pay | Admitting: Adult Health

## 2022-10-14 ENCOUNTER — Inpatient Hospital Stay: Payer: 59 | Attending: Adult Health | Admitting: Adult Health

## 2022-10-14 ENCOUNTER — Other Ambulatory Visit: Payer: Self-pay

## 2022-10-14 VITALS — BP 119/75 | HR 78 | Temp 97.5°F | Resp 14 | Ht 66.0 in | Wt 238.9 lb

## 2022-10-14 DIAGNOSIS — Z1589 Genetic susceptibility to other disease: Secondary | ICD-10-CM

## 2022-10-14 DIAGNOSIS — Z79811 Long term (current) use of aromatase inhibitors: Secondary | ICD-10-CM | POA: Diagnosis not present

## 2022-10-14 DIAGNOSIS — C50211 Malignant neoplasm of upper-inner quadrant of right female breast: Secondary | ICD-10-CM | POA: Diagnosis not present

## 2022-10-14 DIAGNOSIS — Z9189 Other specified personal risk factors, not elsewhere classified: Secondary | ICD-10-CM | POA: Diagnosis not present

## 2022-10-14 DIAGNOSIS — Z170 Estrogen receptor positive status [ER+]: Secondary | ICD-10-CM | POA: Diagnosis not present

## 2022-10-14 DIAGNOSIS — M816 Localized osteoporosis [Lequesne]: Secondary | ICD-10-CM | POA: Diagnosis not present

## 2022-10-14 DIAGNOSIS — M810 Age-related osteoporosis without current pathological fracture: Secondary | ICD-10-CM | POA: Insufficient documentation

## 2022-10-14 DIAGNOSIS — Z8042 Family history of malignant neoplasm of prostate: Secondary | ICD-10-CM | POA: Diagnosis not present

## 2022-10-14 DIAGNOSIS — Z1501 Genetic susceptibility to malignant neoplasm of breast: Secondary | ICD-10-CM | POA: Diagnosis not present

## 2022-10-14 DIAGNOSIS — Z1509 Genetic susceptibility to other malignant neoplasm: Secondary | ICD-10-CM | POA: Diagnosis not present

## 2022-10-14 DIAGNOSIS — I10 Essential (primary) hypertension: Secondary | ICD-10-CM | POA: Diagnosis not present

## 2022-10-14 DIAGNOSIS — Z803 Family history of malignant neoplasm of breast: Secondary | ICD-10-CM | POA: Insufficient documentation

## 2022-10-14 NOTE — Assessment & Plan Note (Addendum)
Zoledronate annually beginning in March 2023.  Orders placed for labs and Zoledronate to be given next week.  Calcium, Vitamin D, and weat bearing exercises reviewed with patient.   Bone Density testing due in 08/2023; orders placed today  F/u with Mendel Ryder in 09/2023 for labs, evaluation, and her next Zometa.

## 2022-10-14 NOTE — Patient Instructions (Signed)
Bone Health Bones protect organs, store calcium, anchor muscles, and support the whole body. Keeping your bones strong is important, especially as you get older. You can take actions to help keep your bones strong and healthy. Why is keeping my bones healthy important?  Keeping your bones healthy is important because your body constantly replaces bone cells. Cells get old, and new cells take their place. As we age, we lose bone cells because the body may not be able to make enough new cells to replace the old cells. The amount of bone cells and bone tissue you have is referred to as bone mass. The higher your bone mass, the stronger your bones. The aging process leads to an overall loss of bone mass in the body, which can increase the likelihood of: Broken bones. A condition in which the bones become weak and brittle (osteoporosis). A large decline in bone mass occurs in older adults. In women, it occurs about the time of menopause. What actions can I take to keep my bones healthy? Good health habits are important for maintaining healthy bones. This includes eating nutritious foods and exercising regularly. To have healthy bones, you need to get enough of the right minerals and vitamins. Most nutrition experts recommend getting these nutrients from the foods that you eat. In some cases, taking supplements may also be recommended. Doing certain types of exercise is also important for bone health. What are the nutritional recommendations for healthy bones?  Eating a well-balanced diet with plenty of calcium and vitamin D will help to protect your bones. Nutritional recommendations vary from person to person. Ask your health care provider what is healthy for you. Here are some general guidelines. Get enough calcium Calcium is the most important (essential) mineral for bone health. Most people can get enough calcium from their diet, but supplements may be recommended for people who are at risk for  osteoporosis. Good sources of calcium include: Dairy products, such as low-fat or nonfat milk, cheese, and yogurt. Dark green leafy vegetables, such as bok choy and broccoli. Foods that have calcium added to them (are fortified). Foods that may be fortified with calcium include orange juice, cereal, bread, soy beverages, and tofu products. Nuts, such as almonds. Follow these recommended amounts for daily calcium intake: Infants, 0-6 months: 200 mg. Infants, 6-12 months: 260 mg. Children, age 1-3: 700 mg. Children, age 4-8: 1,000 mg. Children, age 9-13: 1,300 mg. Teens, age 14-18: 1,300 mg. Adults, age 19-50: 1,000 mg. Adults, age 51-70: Men: 1,000 mg. Women: 1,200 mg. Adults, age 71 or older: 1,200 mg. Pregnant and breastfeeding females: Teens: 1,300 mg. Adults: 1,000 mg. Get enough vitamin D Vitamin D is the most essential vitamin for bone health. It helps the body absorb calcium. Sunlight stimulates the skin to make vitamin D, so be sure to get enough sunlight. If you live in a cold climate or you do not get outside often, your health care provider may recommend that you take vitamin D supplements. Good sources of vitamin D in your diet include: Egg yolks. Saltwater fish. Milk and cereal fortified with vitamin D. Follow these recommended amounts for daily vitamin D intake: Infants, 0-12 months: 400 international units (IU). Children and teens, age 1-18: 600 international units. Adults, age 59 or younger: 600 international units. Adults, age 60 or older: 600-1,000 international units. Get other important nutrients Other nutrients that are important for bone health include: Phosphorus. This mineral is found in meat, poultry, dairy foods, nuts, and legumes. The   recommended daily intake for adult men and adult women is 700 mg. Magnesium. This mineral is found in seeds, nuts, dark green vegetables, and legumes. The recommended daily intake for adult men is 400-420 mg. For adult women,  it is 310-320 mg. Vitamin K. This vitamin is found in green leafy vegetables. The recommended daily intake is 120 mcg for adult men and 90 mcg for adult women. What type of physical activity is best for building and maintaining healthy bones? Weight-bearing and strength-building activities are important for building and maintaining healthy bones. Weight-bearing activities cause muscles and bones to work against gravity. Strength-building activities increase the strength of the muscles that support bones. Weight-bearing and muscle-building activities include: Walking and hiking. Jogging and running. Dancing. Gym exercises. Lifting weights. Tennis and racquetball. Climbing stairs. Aerobics. Adults should get at least 30 minutes of moderate physical activity on most days. Children should get at least 60 minutes of moderate physical activity on most days. Ask your health care provider what type of exercise is best for you. How can I find out if my bone mass is low? Bone mass can be measured with an X-ray test called a bone mineral density (BMD) test. This test is recommended for all women who are age 65 or older. It may also be recommended for: Men who are age 70 or older. People who are at risk for osteoporosis because of: Having a long-term disease that weakens bones, such as kidney disease or rheumatoid arthritis. Having menopause earlier than normal. Taking medicine that weakens bones, such as steroids, thyroid hormones, or hormone treatment for breast cancer or prostate cancer. Smoking. Drinking three or more alcoholic drinks a day. Being underweight. Sedentary lifestyle. If you find that you have a low bone mass, you may be able to prevent osteoporosis or further bone loss by changing your diet and lifestyle. Where can I find more information? Bone Health & Osteoporosis Foundation: www.nof.org/patients National Institutes of Health: www.bones.nih.gov International Osteoporosis  Foundation: www.iofbonehealth.org Summary The aging process leads to an overall loss of bone mass in the body, which can increase the likelihood of broken bones and osteoporosis. Eating a well-balanced diet with plenty of calcium and vitamin D will help to protect your bones. Weight-bearing and strength-building activities are also important for building and maintaining strong bones. Bone mass can be measured with an X-ray test called a bone mineral density (BMD) test. This information is not intended to replace advice given to you by your health care provider. Make sure you discuss any questions you have with your health care provider. Document Revised: 12/27/2020 Document Reviewed: 12/27/2020 Elsevier Patient Education  2023 Elsevier Inc.  

## 2022-10-14 NOTE — Assessment & Plan Note (Signed)
Pancreatic cancer: No FH of pancreatic cancer, therefore MRCP is not indicated.  Breast cancer: Recommend intensified screening with breast MRI alternating with mammogram.  Ovarian Cancer: S/p bilateral RRSO.

## 2022-10-14 NOTE — Progress Notes (Signed)
[facility] Follow up:    Grace Carol, MD [address] [place] [address]   DIAGNOSIS:  Cancer Staging  Malignant neoplasm of upper-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, estrogen receptor positive ([place]) Staging form: Breast, AJCC 8th Edition - Clinical stage from 09/08/2020: Stage IA (cT1b, cN0, cM0, G2, ER+, PR+, HER2+) - Signed by Gardenia Phlegm, NP on 09/20/2020 Stage prefix: Initial diagnosis - Pathologic stage from 10/16/2020: Stage IA (pT1b, pN0, cM0, G3, ER+, PR+, HER2+) - Signed by Gardenia Phlegm, NP on 11/01/2020 Histologic grading system: 3 grade system   SUMMARY OF ONCOLOGIC HISTORY: Oncology History  Malignant neoplasm of upper-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, estrogen receptor positive ([place])  09/08/2020 Cancer Staging   Staging form: Breast, AJCC 8th Edition - Clinical stage from 09/08/2020: Stage IA (cT1b, cN0, cM0, G2, ER+, PR+, HER2+) - Signed by Gardenia Phlegm, NP on 09/20/2020 Stage prefix: Initial diagnosis   09/08/2020 Initial Diagnosis   Mammogram detected 0.7 cm mass in the right breast 1 o'clock position, right breast biopsy 1: Grade 2 IDC ER 95% PR 95%, Ki-67 30%, HER-2 positive ratio 2.83   10/16/2020 Surgery   Right lumpectomy [name]): IDC, 1.0cm, grade 3, with high grade DCIS, clear margins, 4 right axillary lymph nodes negative for carcinoma.   10/16/2020 Cancer Staging   Staging form: Breast, AJCC 8th Edition - Pathologic stage from 10/16/2020: Stage IA (pT1b, pN0, cM0, G3, ER+, PR+, HER2+) - Signed by Gardenia Phlegm, NP on 11/01/2020 Histologic grading system: 3 grade system   11/13/2020 - 12/14/2021 Chemotherapy   Patient is on Treatment Plan : BREAST Paclitaxel + Trastuzumab q7d / Trastuzumab q21d     02/15/2021 - 04/04/2021 Radiation Therapy   Site Technique Total Dose (Gy) Dose per Fx (Gy) Completed Fx Beam Energies  Breast, Right: Breast_Rt 3D 50.4/50.4 1.8 28/28 10X   Breast, Right: Breast_Rt_Bst 3D 10/10 2 5/5 6X, 10X     07/20/2021 -  Anti-estrogen oral therapy   Letrozole x 7 years   11/23/2021 Miscellaneous   Genetics (done at [facility]):  PALBS and MUTYH pathogenic mutations (additional VUS)   04/17/2022 Surgery   Bilateral salpingo-oophorectomy     CURRENT THERAPY: Letrozole  INTERVAL HISTORY: Grace Pennington 52 y.o. female returns for follow-up of her history of breast cancer.  She has continued on letrozole with good tolerance.  She has some mild arthralgias in her right leg and fingers that is intermittent and tolerable.  She underwent bilateral breast mammogram on September 25, 2022.  This showed no mammographic evidence of malignancy in the left breast.  Postoperative changes were seen in the right breast and magnified views identified calcifications in the lumpectomy site.  The calcifications had no suspicious morphology or distribution and felt to be related to fat necrosis.  She was recommended repeat diagnostic mammogram in December 2024 to assess stability.  After discussion patient opted for biopsy of the calcifications which occurred on October 09, 2022.  The right upper inner quadrant needle core biopsy demonstrated fibrosis, focal inflammation and dystrophic calcifications consistent with resolving fat necrosis.  There were negative for malignancy.  Her most recent bone density testing occurred on August 30, 2021 demonstrating osteoporosis with a T-score -2.5 in the AP lumbar spine.  She received Zometa x 1 given in March 2023.  She has not been scheduled for repeat Zometa.  She has a PALB2 mutation and underwent risk reducing bilateral salpingo-oophorectomies in  September 2023 with benign pathology.  She has a MUTYH mutation however I cannot determine if this is monoallelic or biallelic to determine if she should undergo more frequent colonoscopies.  She did undergo a colonoscopy with Dr. Michail Sermon and only had 2 polyps.  She has no  family history of pancreatic cancer, and is otherwise feeling well.     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  MEDICAL HISTORY: Past Medical History:  Diagnosis Date   Anxiety    Breast cancer in female [facility])    Right   Depression    GERD (gastroesophageal reflux disease)    Hypertension    Palpitations    Port-A-Cath in place 11/20/2020   Pre-diabetes    Sleep apnea    uses CPAP    SURGICAL HISTORY: Past Surgical History:  Procedure Laterality Date   BREAST BIOPSY Right 10/09/2022   MM RT BREAST BX W LOC DEV 1ST LESION IMAGE BX SPEC STEREO GUIDE 10/09/2022 [facility]AP[facility]   BREAST LUMPECTOMY WITH RADIOACTIVE SEED AND SENTINEL LYMPH NODE BIOPSY Right 10/16/2020   Procedure: RIGHT BREAST LUMPECTOMY WITH RADIOACTIVE SEED AND SENTINEL LYMPH NODE BIOPSY;  Surgeon: [name], MD;  Location: [facility];  Service: General;  Laterality: Right;   BREAST SURGERY     PORT-A-CATH REMOVAL N/A 12/28/2021   Procedure: REMOVAL PORT-A-CATH;  Surgeon: [name], MD;  Location: [facility];  Service: General;  Laterality: N/A;   PORTACATH PLACEMENT Left 10/16/2020   Procedure: INSERTION PORT-A-CATH;  Surgeon: [name], MD;  Location: [facility];  Service: General;  Laterality: Left;   ROBOTIC ASSISTED BILATERAL SALPINGO OOPHERECTOMY Bilateral 04/17/2022   Procedure: XI ROBOTIC ASSISTED BILATERAL SALPINGO OOPHORECTOMY;  Surgeon: Lafonda Mosses, MD;  [facility];  Service: Gynecology;  Laterality: Bilateral;   TUBAL LIGATION      SOCIAL HISTORY: Social History   Socioeconomic History   Marital status: Single     Spouse name: Not on file   Number of children: 2   Years of education: Not on file   Highest education level: Not on file  Occupational History   Occupation: medical records    Employer: [employer]  Tobacco Use   Smoking status: Former    Types: Cigarettes    Quit date: 07/29/1996    Years since quitting: 26.2   Smokeless tobacco: Never  Vaping Use   Vaping Use: Never used  Substance and Sexual Activity   Alcohol use: Yes    Comment: socially   Drug use: No   Sexual activity: Not Currently    Birth control/protection: Surgical  Other Topics Concern   Not on file  Social History Narrative   Not on file   Social Determinants of Health   Financial Resource Strain: Not on file  Food Insecurity: Not on file  Transportation Needs: Not on file  Physical Activity: Not on file  Stress: Not on file  Social Connections: Not on file  Intimate Partner Violence: Not At Risk (01/24/2021)   Humiliation, Afraid, Rape, and Kick questionnaire    Fear of Current or Ex-Partner: No    Emotionally Abused: No  Physically Abused: No    Sexually Abused: No    FAMILY HISTORY: Family History  Problem Relation Age of Onset   Heart disease Mother    Diabetes Mother    Hyperlipidemia Mother    Hypertension Mother    Kidney failure Mother    Breast cancer Mother    Sleep apnea Father    Diabetes Father    Hyperlipidemia Father    Hypertension Father    Prostate cancer Father    Diabetes Other    Stroke Other    Hypertension Other    Kidney failure Other    Coronary artery disease Other    Colon cancer Neg Hx    Ovarian cancer Neg Hx    Endometrial cancer Neg Hx    Pancreatic cancer Neg Hx     Review of Systems  Constitutional:  Negative for appetite change, chills, fatigue, fever and unexpected weight change.  HENT:   Negative for hearing loss, lump/mass and trouble swallowing.   Eyes:  Negative for eye problems and icterus.  Respiratory:  Negative for chest tightness, cough  and shortness of breath.   Cardiovascular:  Negative for chest pain, leg swelling and palpitations.  Gastrointestinal:  Negative for abdominal distention, abdominal pain, constipation, diarrhea, nausea and vomiting.  Endocrine: Negative for hot flashes.  Genitourinary:  Negative for difficulty urinating.   Musculoskeletal:  Positive for arthralgias.  Skin:  Negative for itching and rash.  Neurological:  Negative for dizziness, extremity weakness, headaches and numbness.  Hematological:  Negative for adenopathy. Does not bruise/bleed easily.  Psychiatric/Behavioral:  Negative for depression. The patient is not nervous/anxious.       PHYSICAL EXAMINATION  ECOG PERFORMANCE STATUS: 1 - Symptomatic but completely ambulatory  Vitals:   10/14/22 1148  BP: 119/75  Pulse: 78  Resp: 14  Temp: (!) 97.5 F (36.4 C)  SpO2: 97%    Physical Exam Constitutional:      General: She is not in acute distress.    Appearance: Normal appearance. She is not toxic-appearing.  HENT:     Head: Normocephalic and atraumatic.  Eyes:     General: No scleral icterus. Cardiovascular:     Rate and Rhythm: Normal rate and regular rhythm.     Pulses: Normal pulses.     Heart sounds: Normal heart sounds.  Pulmonary:     Effort: Pulmonary effort is normal.     Breath sounds: Normal breath sounds.  Chest:     Comments: Right breast status postlumpectomy and radiation no sign of local recurrence left breast is benign. Abdominal:     General: Abdomen is flat. Bowel sounds are normal. There is no distension.     Palpations: Abdomen is soft.     Tenderness: There is no abdominal tenderness.  Musculoskeletal:        General: No swelling.     Cervical back: Neck supple.  Lymphadenopathy:     Cervical: No cervical adenopathy.  Skin:    General: Skin is warm and dry.     Findings: No rash.  Neurological:     General: No focal deficit present.     Mental Status: She is alert.  Psychiatric:        Mood  and Affect: Mood normal.        Behavior: Behavior normal.     LABORATORY DATA:  CBC    Component Value Date/Time   WBC 5.4 04/09/2022 1030   RBC 4.72 04/09/2022 1030   HGB 13.4 04/09/2022  1030   HGB 13.3 10/12/2021 0832   HGB 12.6 10/07/2017 1200   HCT 41.1 04/09/2022 1030   HCT 39.4 10/07/2017 1200   PLT 299 04/09/2022 1030   PLT 255 10/12/2021 0832   MCV 87.1 04/09/2022 1030   MCV 83 10/07/2017 1200   MCH 28.4 04/09/2022 1030   MCHC 32.6 04/09/2022 1030   RDW 15.3 04/09/2022 1030   RDW 16.6 (H) 10/07/2017 1200   LYMPHSABS 1.3 10/12/2021 0832   LYMPHSABS 2.8 10/07/2017 1200   MONOABS 0.6 10/12/2021 0832   EOSABS 0.3 10/12/2021 0832   EOSABS 0.2 10/07/2017 1200   BASOSABS 0.0 10/12/2021 0832   BASOSABS 0.0 10/07/2017 1200    CMP     Component Value Date/Time   NA 139 04/11/2022 0939   NA 143 08/13/2018 0749   K 3.9 04/11/2022 0939   CL 105 04/11/2022 0939   CO2 26 04/11/2022 0939   GLUCOSE 104 (H) 04/11/2022 0939   BUN 15 04/11/2022 0939   BUN 19 08/13/2018 0749   CREATININE 0.98 04/11/2022 0939   CREATININE 1.06 (H) 11/23/2021 0827   CALCIUM 9.6 04/11/2022 0939   PROT 8.3 (H) 04/09/2022 1030   PROT 7.3 08/13/2018 0749   ALBUMIN 3.9 04/09/2022 1030   ALBUMIN 4.2 08/13/2018 0749   AST 21 04/09/2022 1030   AST 20 10/12/2021 0832   ALT 24 04/09/2022 1030   ALT 21 10/12/2021 0832   ALKPHOS 50 04/09/2022 1030   BILITOT 0.5 04/09/2022 1030   BILITOT 0.4 10/12/2021 0832   GFRNONAA >60 04/11/2022 0939   GFRNONAA >60 11/23/2021 0827   GFRAA 83 08/13/2018 0749        ASSESSMENT and THERAPY PLAN:   Malignant neoplasm of upper-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, estrogen receptor positive ([place]) Grace Pennington is a 52 year old woman with stage Ia triple positive breast cancer diagnosed in February 2022.  She is status post right lumpectomy followed by adjuvant chemotherapy with Taxol and Herceptin, followed by maintenance Herceptin to complete a year of  therapy, adjuvant radiation, and antiestrogen therapy with letrozole which she started in December 2022.  Letrozole toxicities: Arthralgias-mild, recommended exercise, Tylenol PRN Osteoporosis--see additional note  Breast cancer monitoring:  Biannual breast exam Annual breast MRI-due 02/2023 Mammogram-due 08/2023  I am in the process of obtaining her genetic testing results to understand more about the MUTYH status and whether she needs more frequent colonoscopies.  Recommended continued healthy diet and exercise.   F/u with Dr. Lindi Adie in 6 months for continued monitoring.   Osteoporosis Zoledronate annually beginning in March 2023.  Orders placed for labs and Zoledronate to be given next week.  Calcium, Vitamin D, and weat bearing exercises reviewed with patient.   Bone Density testing due in 08/2023; orders placed today  F/u with Mendel Ryder in 09/2023 for labs, evaluation, and her next Zometa.     Monoallelic mutation of PALB2 gene Pancreatic cancer: No FH of pancreatic cancer, therefore MRCP is not indicated.  Breast cancer: Recommend intensified screening with breast MRI alternating with mammogram.  Ovarian Cancer: S/p bilateral RRSO.     All questions were answered. The patient knows to call the clinic with any problems, questions or concerns. We can certainly see the patient much sooner if necessary.  Total encounter time:40 minutes*in face-to-face visit time, chart review, lab review, care coordination, order entry, and documentation of the encounter time.    Wilber Bihari, NP 10/14/22 12:48 PM Medical Oncology and Hematology [facility] [place]  [place],  [address] Tel. [phone]    Fax. [phone]  *Total Encounter Time as defined by the Centers for Medicare and Medicaid Services includes, in addition to the face-to-face time of a patient visit (documented in the note above) non-face-to-face time: obtaining and reviewing outside  history, ordering and reviewing medications, tests or procedures, care coordination (communications with other health care professionals or caregivers) and documentation in the medical record.

## 2022-10-14 NOTE — Telephone Encounter (Signed)
Placed call to [employer] per Wilber Bihari, NP to obtain genetic testing results. LVM for Grace Pennington in Medical Records to fax results to provided fax number.

## 2022-10-14 NOTE — Assessment & Plan Note (Addendum)
Grace Pennington is a 52 year old woman with stage Ia triple positive breast cancer diagnosed in February 2022.  She is status post right lumpectomy followed by adjuvant chemotherapy with Taxol and Herceptin, followed by maintenance Herceptin to complete a year of therapy, adjuvant radiation, and antiestrogen therapy with letrozole which she started in December 2022.  Letrozole toxicities: Arthralgias-mild, recommended exercise, Tylenol PRN Osteoporosis--see additional note  Breast cancer monitoring:  Biannual breast exam Annual breast MRI-due 02/2023 Mammogram-due 08/2023  I am in the process of obtaining her genetic testing results to understand more about the MUTYH status and whether she needs more frequent colonoscopies.  Recommended continued healthy diet and exercise.   F/u with Dr. Lindi Adie in 6 months for continued monitoring.

## 2022-10-16 ENCOUNTER — Encounter: Payer: Self-pay | Admitting: Hematology and Oncology

## 2022-10-17 ENCOUNTER — Other Ambulatory Visit: Payer: Self-pay

## 2022-10-17 DIAGNOSIS — Z170 Estrogen receptor positive status [ER+]: Secondary | ICD-10-CM

## 2022-10-19 ENCOUNTER — Other Ambulatory Visit (HOSPITAL_COMMUNITY): Payer: Self-pay

## 2022-10-19 ENCOUNTER — Encounter: Payer: Self-pay | Admitting: Adult Health

## 2022-10-21 ENCOUNTER — Inpatient Hospital Stay: Payer: 59

## 2022-10-21 ENCOUNTER — Other Ambulatory Visit: Payer: Self-pay

## 2022-10-21 VITALS — BP 118/78 | HR 72 | Temp 98.1°F | Resp 16

## 2022-10-21 DIAGNOSIS — M816 Localized osteoporosis [Lequesne]: Secondary | ICD-10-CM

## 2022-10-21 DIAGNOSIS — Z170 Estrogen receptor positive status [ER+]: Secondary | ICD-10-CM | POA: Diagnosis not present

## 2022-10-21 DIAGNOSIS — Z8042 Family history of malignant neoplasm of prostate: Secondary | ICD-10-CM | POA: Diagnosis not present

## 2022-10-21 DIAGNOSIS — Z803 Family history of malignant neoplasm of breast: Secondary | ICD-10-CM | POA: Diagnosis not present

## 2022-10-21 DIAGNOSIS — C50211 Malignant neoplasm of upper-inner quadrant of right female breast: Secondary | ICD-10-CM | POA: Diagnosis not present

## 2022-10-21 DIAGNOSIS — Z79811 Long term (current) use of aromatase inhibitors: Secondary | ICD-10-CM | POA: Diagnosis not present

## 2022-10-21 DIAGNOSIS — I10 Essential (primary) hypertension: Secondary | ICD-10-CM | POA: Diagnosis not present

## 2022-10-21 LAB — CBC WITH DIFFERENTIAL (CANCER CENTER ONLY)
Abs Immature Granulocytes: 0.02 10*3/uL (ref 0.00–0.07)
Basophils Absolute: 0.1 10*3/uL (ref 0.0–0.1)
Basophils Relative: 1 %
Eosinophils Absolute: 0.3 10*3/uL (ref 0.0–0.5)
Eosinophils Relative: 5 %
HCT: 40.1 % (ref 36.0–46.0)
Hemoglobin: 13.4 g/dL (ref 12.0–15.0)
Immature Granulocytes: 0 %
Lymphocytes Relative: 29 %
Lymphs Abs: 1.8 10*3/uL (ref 0.7–4.0)
MCH: 28.3 pg (ref 26.0–34.0)
MCHC: 33.4 g/dL (ref 30.0–36.0)
MCV: 84.6 fL (ref 80.0–100.0)
Monocytes Absolute: 0.6 10*3/uL (ref 0.1–1.0)
Monocytes Relative: 10 %
Neutro Abs: 3.3 10*3/uL (ref 1.7–7.7)
Neutrophils Relative %: 55 %
Platelet Count: 256 10*3/uL (ref 150–400)
RBC: 4.74 MIL/uL (ref 3.87–5.11)
RDW: 15.7 % — ABNORMAL HIGH (ref 11.5–15.5)
WBC Count: 6.1 10*3/uL (ref 4.0–10.5)
nRBC: 0 % (ref 0.0–0.2)

## 2022-10-21 LAB — CMP (CANCER CENTER ONLY)
ALT: 14 U/L (ref 0–44)
AST: 17 U/L (ref 15–41)
Albumin: 4.4 g/dL (ref 3.5–5.0)
Alkaline Phosphatase: 56 U/L (ref 38–126)
Anion gap: 6 (ref 5–15)
BUN: 16 mg/dL (ref 6–20)
CO2: 28 mmol/L (ref 22–32)
Calcium: 10 mg/dL (ref 8.9–10.3)
Chloride: 105 mmol/L (ref 98–111)
Creatinine: 0.92 mg/dL (ref 0.44–1.00)
GFR, Estimated: >60 mL/min (ref >60)
Glucose, Bld: 107 mg/dL — ABNORMAL HIGH (ref 70–99)
Potassium: 4.2 mmol/L (ref 3.5–5.1)
Sodium: 139 mmol/L (ref 135–145)
Total Bilirubin: 0.4 mg/dL (ref 0.3–1.2)
Total Protein: 7.7 g/dL (ref 6.5–8.1)

## 2022-10-21 MED ORDER — SODIUM CHLORIDE 0.9 % IV SOLN: Freq: Once | INTRAVENOUS | Status: AC

## 2022-10-21 MED ORDER — ZOLEDRONIC ACID 4 MG/100ML IV SOLN
4.0000 mg | Freq: Once | INTRAVENOUS | Status: AC
  Administered 2022-10-21: 4 mg via INTRAVENOUS
  Filled 2022-10-21: qty 100

## 2022-10-21 NOTE — Patient Instructions (Signed)

## 2022-10-28 ENCOUNTER — Encounter: Payer: Self-pay | Admitting: Pharmacist

## 2022-10-28 ENCOUNTER — Other Ambulatory Visit: Payer: Self-pay

## 2022-10-28 ENCOUNTER — Other Ambulatory Visit (HOSPITAL_COMMUNITY): Payer: Self-pay

## 2022-10-28 DIAGNOSIS — L680 Hirsutism: Secondary | ICD-10-CM | POA: Diagnosis not present

## 2022-10-28 DIAGNOSIS — Z6837 Body mass index (BMI) 37.0-37.9, adult: Secondary | ICD-10-CM | POA: Diagnosis not present

## 2022-10-28 DIAGNOSIS — N390 Urinary tract infection, site not specified: Secondary | ICD-10-CM | POA: Diagnosis not present

## 2022-10-28 DIAGNOSIS — Z113 Encounter for screening for infections with a predominantly sexual mode of transmission: Secondary | ICD-10-CM | POA: Diagnosis not present

## 2022-10-28 DIAGNOSIS — Z124 Encounter for screening for malignant neoplasm of cervix: Secondary | ICD-10-CM | POA: Diagnosis not present

## 2022-10-28 DIAGNOSIS — Z01419 Encounter for gynecological examination (general) (routine) without abnormal findings: Secondary | ICD-10-CM | POA: Diagnosis not present

## 2022-10-28 DIAGNOSIS — Z01411 Encounter for gynecological examination (general) (routine) with abnormal findings: Secondary | ICD-10-CM | POA: Diagnosis not present

## 2022-10-28 DIAGNOSIS — R300 Dysuria: Secondary | ICD-10-CM | POA: Diagnosis not present

## 2022-10-28 MED ORDER — SULFAMETHOXAZOLE-TRIMETHOPRIM 800-160 MG PO TABS
1.0000 | ORAL_TABLET | Freq: Two times a day (BID) | ORAL | 0 refills | Status: AC
  Filled 2022-10-28: qty 6, 3d supply, fill #0

## 2022-10-28 MED ORDER — SPIRONOLACTONE 50 MG PO TABS
50.0000 mg | ORAL_TABLET | Freq: Two times a day (BID) | ORAL | 12 refills | Status: DC
  Filled 2023-01-24: qty 60, 30d supply, fill #0
  Filled 2023-10-07: qty 180, 90d supply, fill #1

## 2022-10-28 MED ORDER — SPIRONOLACTONE 50 MG PO TABS: 50.0000 mg | ORAL_TABLET | Freq: Every day | ORAL | 12 refills | Status: DC

## 2022-10-29 ENCOUNTER — Other Ambulatory Visit (HOSPITAL_COMMUNITY): Payer: Self-pay

## 2022-10-29 MED ORDER — SPIRONOLACTONE 50 MG PO TABS
50.0000 mg | ORAL_TABLET | Freq: Two times a day (BID) | ORAL | 3 refills | Status: DC
  Filled 2022-10-29: qty 180, 90d supply, fill #0

## 2022-11-07 DIAGNOSIS — G4733 Obstructive sleep apnea (adult) (pediatric): Secondary | ICD-10-CM | POA: Diagnosis not present

## 2022-11-15 ENCOUNTER — Other Ambulatory Visit: Payer: Self-pay

## 2022-11-21 ENCOUNTER — Other Ambulatory Visit (HOSPITAL_COMMUNITY): Payer: Self-pay

## 2022-11-27 ENCOUNTER — Other Ambulatory Visit (HOSPITAL_COMMUNITY): Payer: Self-pay

## 2022-12-07 DIAGNOSIS — G4733 Obstructive sleep apnea (adult) (pediatric): Secondary | ICD-10-CM | POA: Diagnosis not present

## 2022-12-13 ENCOUNTER — Other Ambulatory Visit (HOSPITAL_COMMUNITY): Payer: Self-pay | Admitting: Internal Medicine

## 2022-12-16 ENCOUNTER — Other Ambulatory Visit (HOSPITAL_COMMUNITY): Payer: Self-pay

## 2022-12-16 MED ORDER — CARVEDILOL 3.125 MG PO TABS
3.1250 mg | ORAL_TABLET | Freq: Two times a day (BID) | ORAL | 3 refills | Status: AC
  Filled 2022-12-16: qty 60, 30d supply, fill #0
  Filled 2023-01-20: qty 60, 30d supply, fill #1
  Filled 2023-02-14: qty 60, 30d supply, fill #2
  Filled 2023-04-08: qty 60, 30d supply, fill #3

## 2022-12-25 ENCOUNTER — Other Ambulatory Visit (HOSPITAL_COMMUNITY): Payer: Self-pay

## 2023-01-06 DIAGNOSIS — G4733 Obstructive sleep apnea (adult) (pediatric): Secondary | ICD-10-CM | POA: Diagnosis not present

## 2023-01-13 ENCOUNTER — Other Ambulatory Visit (HOSPITAL_COMMUNITY): Payer: Self-pay

## 2023-01-13 ENCOUNTER — Ambulatory Visit: Payer: 59 | Attending: General Surgery

## 2023-01-13 ENCOUNTER — Other Ambulatory Visit: Payer: Self-pay

## 2023-01-13 VITALS — Wt 235.2 lb

## 2023-01-13 DIAGNOSIS — Z483 Aftercare following surgery for neoplasm: Secondary | ICD-10-CM | POA: Insufficient documentation

## 2023-01-13 MED ORDER — LOSARTAN POTASSIUM 25 MG PO TABS
25.0000 mg | ORAL_TABLET | Freq: Every day | ORAL | 3 refills | Status: DC
  Filled 2023-01-13: qty 90, 90d supply, fill #0
  Filled 2023-04-08: qty 90, 90d supply, fill #1
  Filled 2023-09-15: qty 90, 90d supply, fill #2
  Filled 2023-12-10: qty 90, 90d supply, fill #3

## 2023-01-13 MED ORDER — METFORMIN HCL 1000 MG PO TABS
1000.0000 mg | ORAL_TABLET | Freq: Two times a day (BID) | ORAL | 3 refills | Status: DC
  Filled 2023-02-26: qty 180, 90d supply, fill #0
  Filled 2023-06-17: qty 180, 90d supply, fill #1
  Filled 2023-10-07: qty 180, 90d supply, fill #2
  Filled 2024-01-01: qty 180, 90d supply, fill #3

## 2023-01-13 NOTE — Therapy (Signed)
OUTPATIENT PHYSICAL THERAPY SOZO SCREENING NOTE   Patient Name: Grace Pennington MRN: [id number] DOB:October 30, 1970, 52 y.o., female Today's Date: 01/13/2023  PCP: Renford Dills, MD REFERRING PROVIDER: Griselda Miner, MD   PT End of Session - 01/13/23 0919     Visit Number 7   # unchanged due to screen only   PT Start Time 0917    PT Stop Time 0920    PT Time Calculation (min) 3 min    Activity Tolerance Patient tolerated treatment well    Behavior During Therapy [facility] for tasks assessed/performed              Past Medical History:  Diagnosis Date   Anxiety    Breast cancer in female [facility])    Right   Depression    GERD (gastroesophageal reflux disease)    Hypertension    Palpitations    Port-A-Cath in place 11/20/2020   Pre-diabetes    Sleep apnea    uses CPAP   Past Surgical History:  Procedure Laterality Date   BREAST BIOPSY Right 10/09/2022   MM RT BREAST BX W LOC DEV 1ST LESION IMAGE BX SPEC STEREO GUIDE 10/09/2022 [facility]MO[facility]   BREAST LUMPECTOMY WITH RADIOACTIVE SEED AND SENTINEL LYMPH NODE BIOPSY Right 10/16/2020   Procedure: RIGHT BREAST LUMPECTOMY WITH RADIOACTIVE SEED AND SENTINEL LYMPH NODE BIOPSY;  Surgeon: Griselda Miner, MD;  [facility];  Service: General;  Laterality: Right;   BREAST SURGERY     PORT-A-CATH REMOVAL N/A 12/28/2021   Procedure: REMOVAL PORT-A-CATH;  Surgeon: Griselda Miner, MD;  Location: [facility];  Service: General;  Laterality: N/A;   PORTACATH PLACEMENT Left 10/16/2020   Procedure: INSERTION PORT-A-CATH;  Surgeon: Griselda Miner, MD;  Location: [facility] OR;  Service: General;  Laterality: Left;   ROBOTIC ASSISTED BILATERAL SALPINGO OOPHERECTOMY Bilateral 04/17/2022   Procedure: XI ROBOTIC ASSISTED BILATERAL SALPINGO OOPHORECTOMY;  Surgeon: Carver Fila, MD;  [facility];  Service: Gynecology;  Laterality: Bilateral;   TUBAL LIGATION     Patient Active Problem List   Diagnosis Date Noted   Osteoporosis  10/14/2022   Monoallelic mutation of PALB2 gene    Malignant neoplasm of 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 Carolynne Edouard 0/4 nodes positive due to ER+ breast cancer with chemotherapy and radiation, completed radiation on 9/7,  HTN   PRECAUTIONS: right UE Lymphedema risk, None  SUBJECTIVE: Pt returns for her last 3 month L-Dex screen.   PAIN:  Are you having pain? No  SOZO SCREENING: Patient was assessed today using the SOZO machine to determine the lymphedema index score. This was compared to her baseline score. It was determined that she is within the recommended range when compared to her baseline and no further action is needed at this time. She will continue SOZO screenings. These are done every 3 months for 2 years post operatively followed by every 6 months for 2 years, and then annually.   L-DEX FLOWSHEETS - 01/13/23 0900       L-DEX LYMPHEDEMA SCREENING   Measurement Type Unilateral    L-DEX MEASUREMENT EXTREMITY Upper Extremity    POSITION  Standing  DOMINANT SIDE Right    At Risk Side Right    BASELINE SCORE (UNILATERAL) 1    L-DEX SCORE (UNILATERAL) 3.1    VALUE CHANGE (UNILAT) 2.1            P: Pt to begin 6 month screens next.   Hermenia Bers, PTA 01/13/2023, 9:21 AM

## 2023-01-20 ENCOUNTER — Other Ambulatory Visit: Payer: Self-pay

## 2023-01-20 ENCOUNTER — Other Ambulatory Visit: Payer: Self-pay | Admitting: Hematology and Oncology

## 2023-01-20 ENCOUNTER — Other Ambulatory Visit (HOSPITAL_COMMUNITY): Payer: Self-pay

## 2023-01-20 DIAGNOSIS — R921 Mammographic calcification found on diagnostic imaging of breast: Secondary | ICD-10-CM

## 2023-01-22 ENCOUNTER — Other Ambulatory Visit (HOSPITAL_COMMUNITY): Payer: Self-pay

## 2023-01-22 MED ORDER — PANTOPRAZOLE SODIUM 40 MG PO TBEC
40.0000 mg | DELAYED_RELEASE_TABLET | Freq: Every day | ORAL | 2 refills | Status: AC
  Filled 2023-01-22: qty 90, 90d supply, fill #0

## 2023-01-24 ENCOUNTER — Other Ambulatory Visit (HOSPITAL_COMMUNITY): Payer: Self-pay

## 2023-01-24 ENCOUNTER — Other Ambulatory Visit: Payer: Self-pay

## 2023-01-27 ENCOUNTER — Other Ambulatory Visit (HOSPITAL_COMMUNITY): Payer: Self-pay

## 2023-01-29 ENCOUNTER — Other Ambulatory Visit (HOSPITAL_COMMUNITY): Payer: Self-pay

## 2023-01-31 ENCOUNTER — Other Ambulatory Visit (HOSPITAL_COMMUNITY): Payer: Self-pay

## 2023-02-04 ENCOUNTER — Other Ambulatory Visit (HOSPITAL_COMMUNITY): Payer: Self-pay

## 2023-02-05 DIAGNOSIS — G4733 Obstructive sleep apnea (adult) (pediatric): Secondary | ICD-10-CM | POA: Diagnosis not present

## 2023-02-10 ENCOUNTER — Other Ambulatory Visit (HOSPITAL_COMMUNITY): Payer: Self-pay

## 2023-02-13 ENCOUNTER — Other Ambulatory Visit (HOSPITAL_COMMUNITY): Payer: Self-pay

## 2023-02-14 ENCOUNTER — Other Ambulatory Visit: Payer: Self-pay

## 2023-02-14 ENCOUNTER — Other Ambulatory Visit (HOSPITAL_COMMUNITY): Payer: Self-pay

## 2023-02-17 ENCOUNTER — Other Ambulatory Visit (HOSPITAL_COMMUNITY): Payer: Self-pay

## 2023-02-18 ENCOUNTER — Other Ambulatory Visit (HOSPITAL_COMMUNITY): Payer: Self-pay

## 2023-02-19 ENCOUNTER — Other Ambulatory Visit (HOSPITAL_COMMUNITY): Payer: Self-pay

## 2023-02-26 ENCOUNTER — Other Ambulatory Visit (HOSPITAL_COMMUNITY): Payer: Self-pay

## 2023-02-26 ENCOUNTER — Other Ambulatory Visit: Payer: Self-pay

## 2023-02-27 ENCOUNTER — Inpatient Hospital Stay: Admission: RE | Admit: 2023-02-27 | Payer: 59 | Source: Ambulatory Visit

## 2023-03-08 DIAGNOSIS — G4733 Obstructive sleep apnea (adult) (pediatric): Secondary | ICD-10-CM | POA: Diagnosis not present

## 2023-03-10 ENCOUNTER — Other Ambulatory Visit (HOSPITAL_COMMUNITY): Payer: Self-pay

## 2023-03-10 ENCOUNTER — Other Ambulatory Visit: Payer: Self-pay

## 2023-03-12 ENCOUNTER — Other Ambulatory Visit (HOSPITAL_COMMUNITY): Payer: Self-pay

## 2023-03-24 ENCOUNTER — Other Ambulatory Visit (HOSPITAL_COMMUNITY): Payer: Self-pay

## 2023-03-24 DIAGNOSIS — K219 Gastro-esophageal reflux disease without esophagitis: Secondary | ICD-10-CM | POA: Diagnosis not present

## 2023-03-24 MED ORDER — PANTOPRAZOLE SODIUM 40 MG PO TBEC
40.0000 mg | DELAYED_RELEASE_TABLET | Freq: Every day | ORAL | 3 refills | Status: AC
  Filled 2023-12-10: qty 90, 90d supply, fill #0
  Filled 2024-03-04: qty 90, 90d supply, fill #1

## 2023-03-31 ENCOUNTER — Other Ambulatory Visit (HOSPITAL_COMMUNITY): Payer: Self-pay

## 2023-04-01 ENCOUNTER — Other Ambulatory Visit (HOSPITAL_COMMUNITY): Payer: Self-pay

## 2023-04-01 ENCOUNTER — Other Ambulatory Visit: Payer: Self-pay

## 2023-04-01 MED ORDER — CITALOPRAM HYDROBROMIDE 10 MG PO TABS
10.0000 mg | ORAL_TABLET | Freq: Every day | ORAL | 3 refills | Status: DC
  Filled 2023-04-01: qty 90, 90d supply, fill #0
  Filled 2023-07-19: qty 90, 90d supply, fill #1
  Filled 2023-10-31: qty 90, 90d supply, fill #2
  Filled 2024-01-31: qty 90, 90d supply, fill #3

## 2023-04-02 ENCOUNTER — Other Ambulatory Visit (HOSPITAL_COMMUNITY): Payer: Self-pay

## 2023-04-06 ENCOUNTER — Other Ambulatory Visit: Payer: 59

## 2023-04-07 DIAGNOSIS — G4733 Obstructive sleep apnea (adult) (pediatric): Secondary | ICD-10-CM | POA: Diagnosis not present

## 2023-04-08 ENCOUNTER — Other Ambulatory Visit (HOSPITAL_COMMUNITY): Payer: Self-pay

## 2023-04-09 DIAGNOSIS — G4733 Obstructive sleep apnea (adult) (pediatric): Secondary | ICD-10-CM | POA: Diagnosis not present

## 2023-04-09 DIAGNOSIS — R42 Dizziness and giddiness: Secondary | ICD-10-CM | POA: Diagnosis not present

## 2023-04-09 DIAGNOSIS — E1169 Type 2 diabetes mellitus with other specified complication: Secondary | ICD-10-CM | POA: Diagnosis not present

## 2023-04-09 DIAGNOSIS — C50211 Malignant neoplasm of upper-inner quadrant of right female breast: Secondary | ICD-10-CM | POA: Diagnosis not present

## 2023-04-09 DIAGNOSIS — R110 Nausea: Secondary | ICD-10-CM | POA: Diagnosis not present

## 2023-04-09 DIAGNOSIS — E7800 Pure hypercholesterolemia, unspecified: Secondary | ICD-10-CM | POA: Diagnosis not present

## 2023-04-09 DIAGNOSIS — F410 Panic disorder [episodic paroxysmal anxiety] without agoraphobia: Secondary | ICD-10-CM | POA: Diagnosis not present

## 2023-04-09 DIAGNOSIS — I959 Hypotension, unspecified: Secondary | ICD-10-CM | POA: Diagnosis not present

## 2023-04-11 IMAGING — MG DIGITAL DIAGNOSTIC BILAT W/ TOMO W/ CAD
8 of 13 series · 8 of 37 positions shown · non-contrast
Comparison: Previous exam(s).

CLINICAL DATA: 50-year-old female with history of right breast
lumpectomy in 0000 presenting for routine annual surveillance.

EXAM:
DIGITAL DIAGNOSTIC BILATERAL MAMMOGRAM WITH TOMOSYNTHESIS AND CAD
TECHNIQUE: Bilateral digital diagnostic mammography and breast tomosynthesis
was performed. The images were evaluated with computer-aided
detection.

[R MLO]
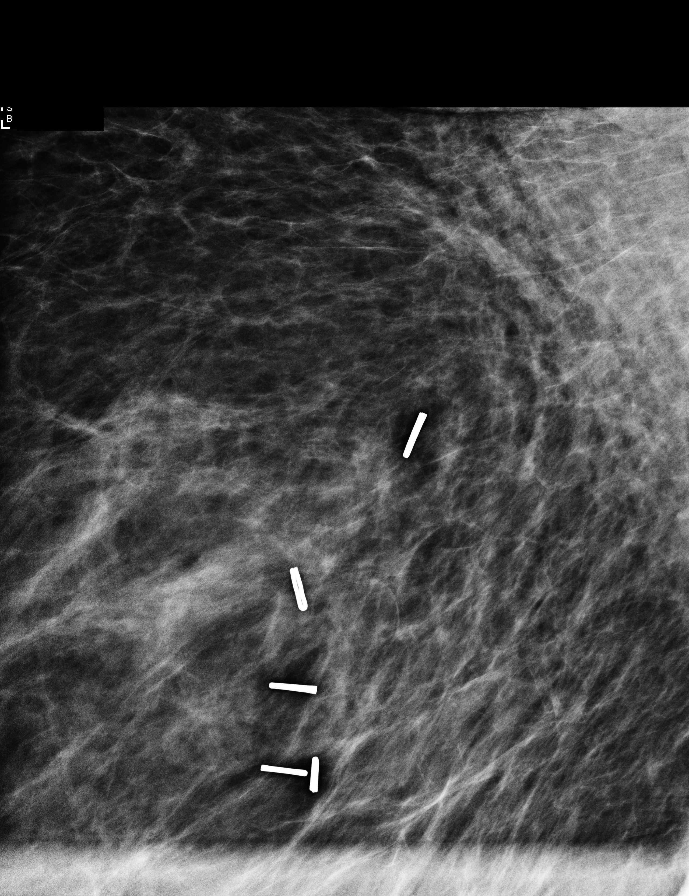

[R MLO synth-2D (1 of 2)]
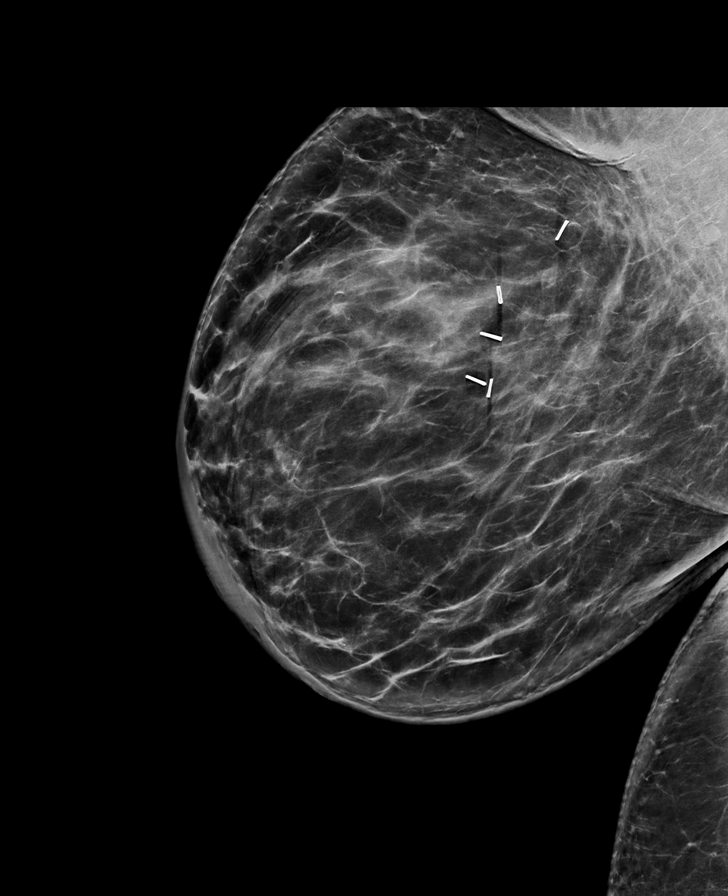

[R CC synth-2D]
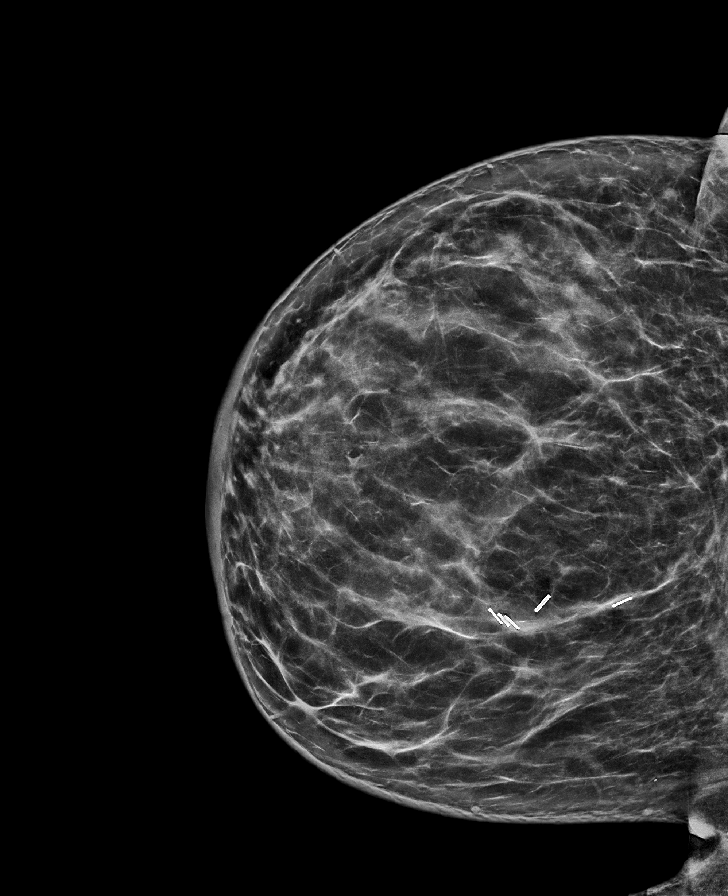

[L CC synth-2D]
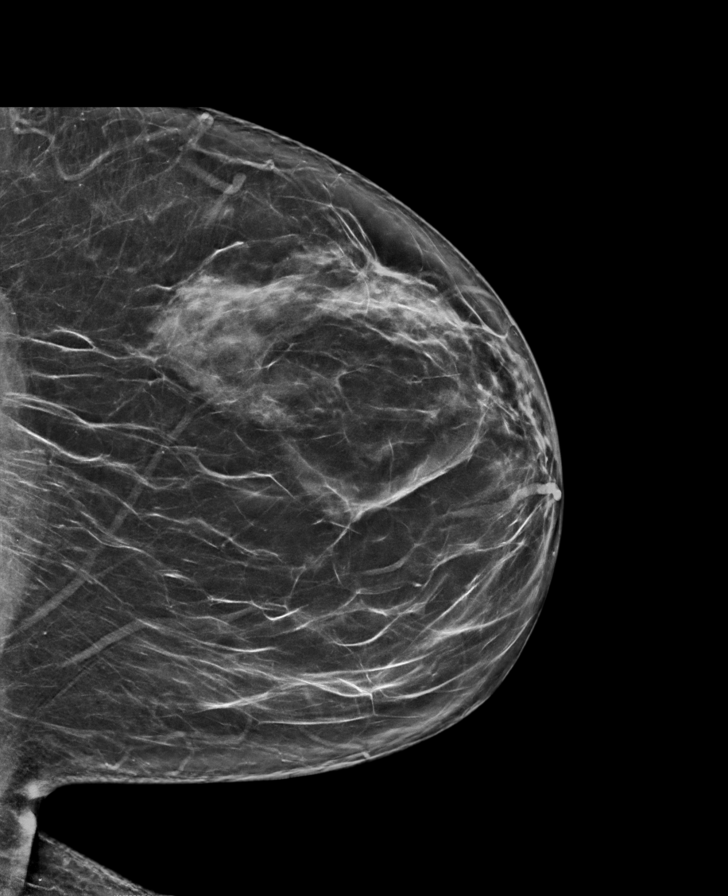

[L MLO synth-2D (1 of 2)]
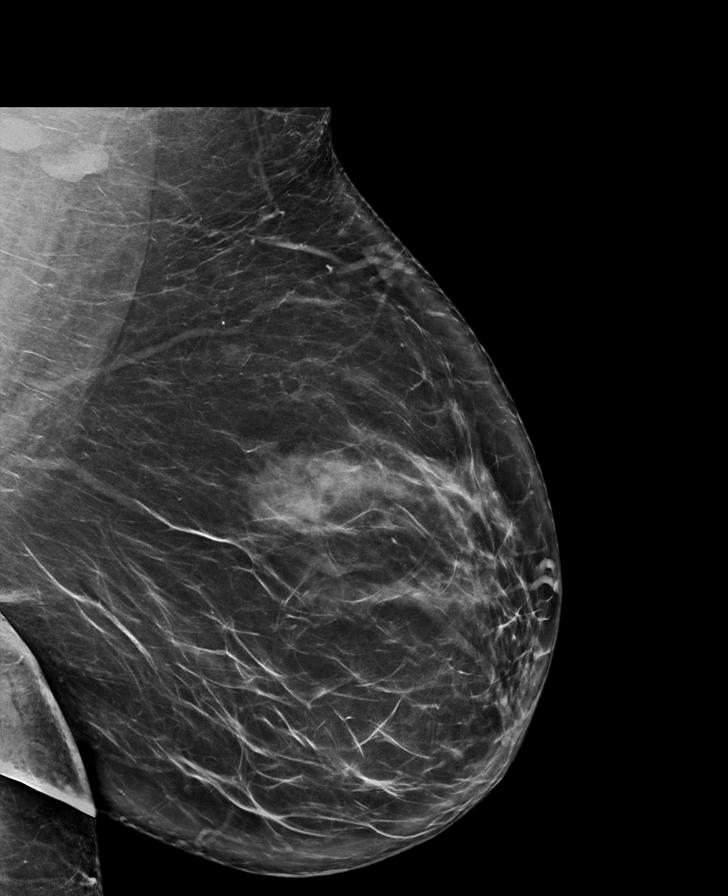

[L MLO synth-2D (2 of 2)]
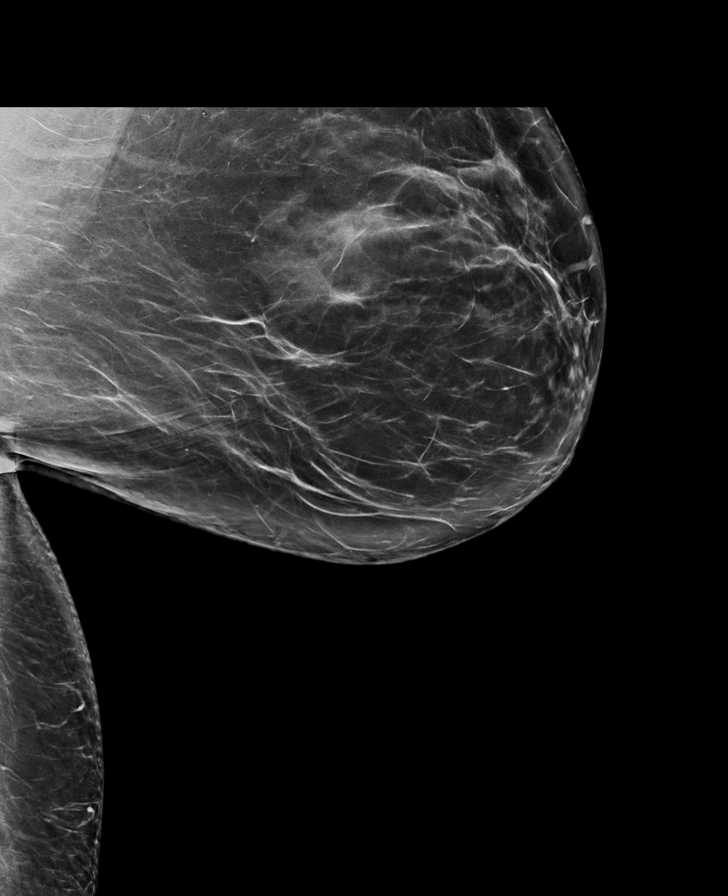

[R MLO synth-2D (2 of 2)]
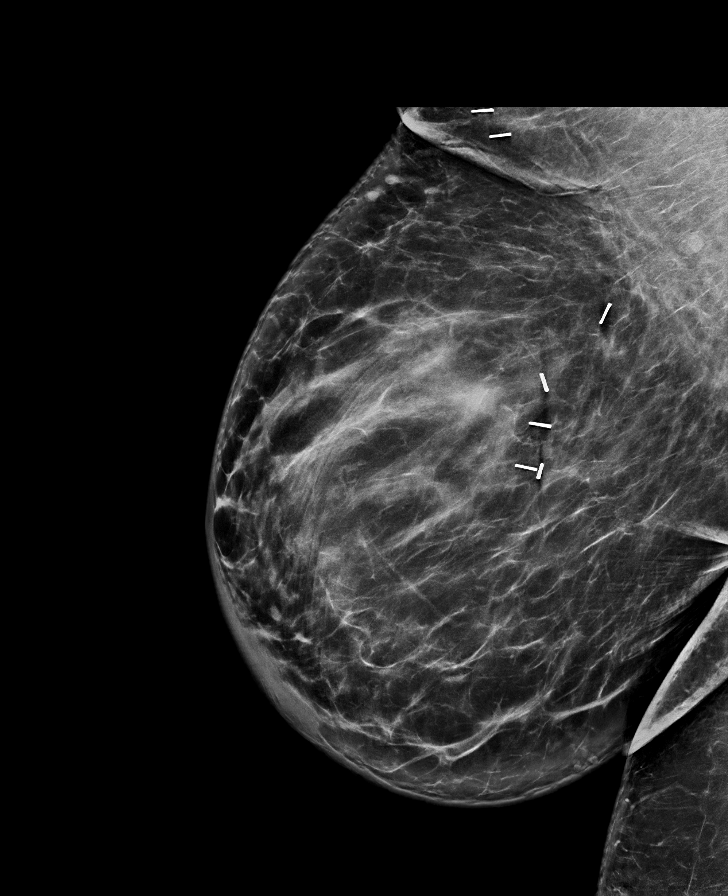

[R MLO tomo · tomo slice 53/105.0]
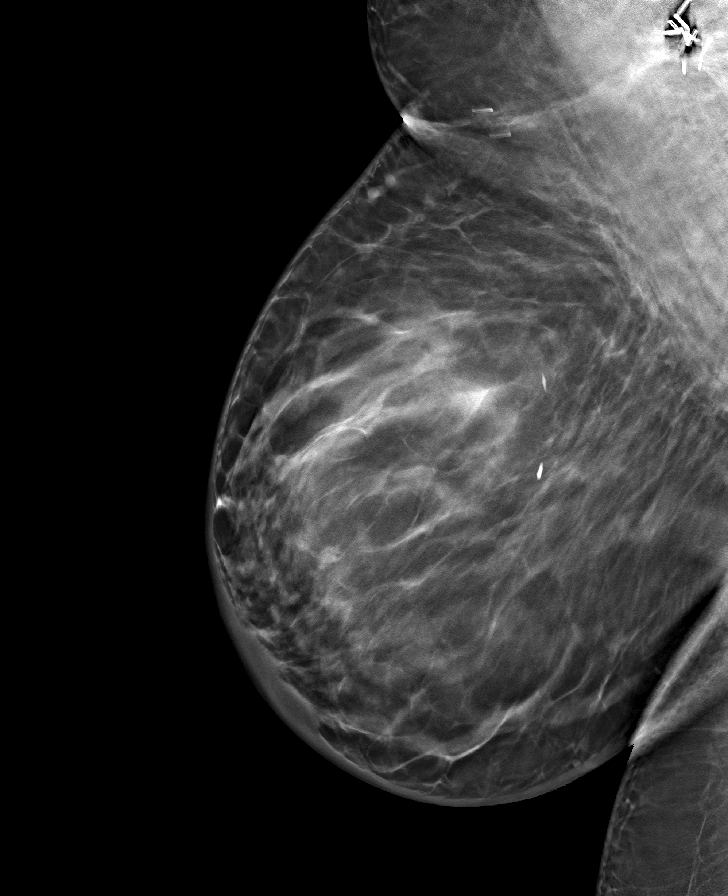

[8 of 37 positions shown; findings below may reference images not displayed]

ACR Breast Density Category c: The breast tissue is heterogeneously
dense, which may obscure small masses.
FINDINGS: Expected surgical changes noted at the lumpectomy site in the upper
inner right breast. No suspicious calcifications, masses or areas of
distortion are seen in the bilateral breasts.
IMPRESSION: Expected surgical changes at the right breast lumpectomy site. No
mammographic evidence of malignancy in the bilateral breasts.

RECOMMENDATION:
Diagnostic mammogram is suggested in 1 year. (Code:H5-L-1ZF)

I have discussed the findings and recommendations with the patient.
If applicable, a reminder letter will be sent to the patient
regarding the next appointment.

BI-RADS CATEGORY  2: Benign.

## 2023-04-19 ENCOUNTER — Other Ambulatory Visit (HOSPITAL_COMMUNITY): Payer: Self-pay

## 2023-04-27 ENCOUNTER — Other Ambulatory Visit (HOSPITAL_COMMUNITY): Payer: Self-pay

## 2023-05-03 ENCOUNTER — Ambulatory Visit
Admission: RE | Admit: 2023-05-03 | Discharge: 2023-05-03 | Disposition: A | Discharge status: Home / Self Care | Payer: 59 | Source: Ambulatory Visit | Attending: Adult Health | Admitting: Adult Health

## 2023-05-03 DIAGNOSIS — Z1509 Genetic susceptibility to other malignant neoplasm: Secondary | ICD-10-CM

## 2023-05-03 DIAGNOSIS — Z9189 Other specified personal risk factors, not elsewhere classified: Secondary | ICD-10-CM

## 2023-05-03 DIAGNOSIS — C50211 Malignant neoplasm of upper-inner quadrant of right female breast: Secondary | ICD-10-CM

## 2023-05-03 DIAGNOSIS — Z853 Personal history of malignant neoplasm of breast: Secondary | ICD-10-CM | POA: Diagnosis not present

## 2023-05-03 DIAGNOSIS — Z1239 Encounter for other screening for malignant neoplasm of breast: Secondary | ICD-10-CM | POA: Diagnosis not present

## 2023-05-03 MED ORDER — GADOPICLENOL 0.5 MMOL/ML IV SOLN
10.0000 mL | Freq: Once | INTRAVENOUS | Status: AC | PRN
  Administered 2023-05-03: 10 mL via INTRAVENOUS

## 2023-05-06 ENCOUNTER — Telehealth: Payer: Self-pay | Admitting: Adult Health

## 2023-05-06 NOTE — Telephone Encounter (Signed)
Per Lillard Anes, DNP, called pt with message below, pt was appreciative and verbalized understanding.

## 2023-05-06 NOTE — Telephone Encounter (Signed)
-----   Message from Noreene Filbert sent at 05/05/2023  5:12 PM EDT ----- Negative mri, please notify patient ----- Message ----- From: Interface, Rad Results In Sent: 05/05/2023   8:54 AM EDT To: Loa Socks, NP

## 2023-05-07 DIAGNOSIS — G4733 Obstructive sleep apnea (adult) (pediatric): Secondary | ICD-10-CM | POA: Diagnosis not present

## 2023-05-09 ENCOUNTER — Other Ambulatory Visit (HOSPITAL_COMMUNITY): Payer: Self-pay

## 2023-05-26 DIAGNOSIS — K2930 Chronic superficial gastritis without bleeding: Secondary | ICD-10-CM | POA: Diagnosis not present

## 2023-05-26 DIAGNOSIS — K219 Gastro-esophageal reflux disease without esophagitis: Secondary | ICD-10-CM | POA: Diagnosis not present

## 2023-05-26 DIAGNOSIS — B9681 Helicobacter pylori [H. pylori] as the cause of diseases classified elsewhere: Secondary | ICD-10-CM | POA: Diagnosis not present

## 2023-05-28 ENCOUNTER — Encounter: Payer: Self-pay | Admitting: Adult Health

## 2023-05-28 ENCOUNTER — Other Ambulatory Visit (HOSPITAL_COMMUNITY): Payer: Self-pay

## 2023-05-28 MED ORDER — INFLUENZA VIRUS VACC SPLIT PF (FLUZONE) 0.5 ML IM SUSY
0.5000 mL | PREFILLED_SYRINGE | INTRAMUSCULAR | 0 refills | Status: AC
  Filled 2023-05-28: qty 0.5, 1d supply, fill #0

## 2023-05-29 ENCOUNTER — Other Ambulatory Visit (HOSPITAL_COMMUNITY): Payer: Self-pay

## 2023-06-07 DIAGNOSIS — G4733 Obstructive sleep apnea (adult) (pediatric): Secondary | ICD-10-CM | POA: Diagnosis not present

## 2023-06-10 ENCOUNTER — Other Ambulatory Visit: Payer: Self-pay

## 2023-06-10 ENCOUNTER — Other Ambulatory Visit: Payer: Self-pay | Admitting: Hematology and Oncology

## 2023-06-10 ENCOUNTER — Other Ambulatory Visit (HOSPITAL_COMMUNITY): Payer: Self-pay

## 2023-06-10 MED ORDER — LETROZOLE 2.5 MG PO TABS
2.5000 mg | ORAL_TABLET | Freq: Every day | ORAL | 3 refills | Status: DC
  Filled 2023-06-10 – 2023-07-07 (×2): qty 90, 90d supply, fill #0
  Filled 2023-10-25: qty 90, 90d supply, fill #1
  Filled 2024-01-20: qty 90, 90d supply, fill #2
  Filled 2024-04-19: qty 90, 90d supply, fill #3

## 2023-06-10 MED ORDER — AMOXICILLIN 500 MG PO CAPS
1000.0000 mg | ORAL_CAPSULE | Freq: Two times a day (BID) | ORAL | 0 refills | Status: AC
  Filled 2023-06-10: qty 56, 14d supply, fill #0

## 2023-06-10 MED ORDER — LANSOPRAZOLE 30 MG PO CPDR
30.0000 mg | DELAYED_RELEASE_CAPSULE | Freq: Two times a day (BID) | ORAL | 0 refills | Status: AC
  Filled 2023-06-10: qty 28, 14d supply, fill #0

## 2023-06-10 MED ORDER — CLARITHROMYCIN 500 MG PO TABS
500.0000 mg | ORAL_TABLET | Freq: Two times a day (BID) | ORAL | 0 refills | Status: AC
  Filled 2023-06-10: qty 28, 14d supply, fill #0

## 2023-06-11 ENCOUNTER — Other Ambulatory Visit: Payer: Self-pay

## 2023-06-11 ENCOUNTER — Inpatient Hospital Stay: Payer: Self-pay | Admitting: Hematology and Oncology

## 2023-06-11 ENCOUNTER — Other Ambulatory Visit (HOSPITAL_COMMUNITY): Payer: Self-pay

## 2023-06-17 ENCOUNTER — Other Ambulatory Visit: Payer: Self-pay

## 2023-06-17 ENCOUNTER — Other Ambulatory Visit (HOSPITAL_COMMUNITY): Payer: Self-pay

## 2023-06-17 MED ORDER — ROSUVASTATIN CALCIUM 10 MG PO TABS
10.0000 mg | ORAL_TABLET | Freq: Every day | ORAL | 3 refills | Status: DC
  Filled 2023-06-17: qty 90, 90d supply, fill #0
  Filled 2023-10-07: qty 90, 90d supply, fill #1
  Filled 2024-01-01: qty 90, 90d supply, fill #2
  Filled 2024-04-14: qty 90, 90d supply, fill #3

## 2023-07-07 ENCOUNTER — Other Ambulatory Visit (HOSPITAL_COMMUNITY): Payer: Self-pay

## 2023-07-07 ENCOUNTER — Ambulatory Visit: Payer: 59 | Attending: General Surgery

## 2023-07-07 VITALS — Wt 224.4 lb

## 2023-07-07 DIAGNOSIS — Z483 Aftercare following surgery for neoplasm: Secondary | ICD-10-CM | POA: Insufficient documentation

## 2023-07-07 NOTE — Therapy (Signed)
OUTPATIENT PHYSICAL THERAPY SOZO SCREENING NOTE   Patient Name: Grace Pennington MRN: [id number] DOB:[birth year], 52 y.o., female Today's Date: 07/07/2023  PCP: Renford Dills, MD REFERRING PROVIDER: Griselda Miner, MD   PT End of Session - 07/07/23 [phone]     Visit Number 7   # unchanged due to screen only   PT Start Time 0919    PT Stop Time 0923    PT Time Calculation (min) 4 min    Activity Tolerance Patient tolerated treatment well    Behavior During Therapy [facility] for tasks assessed/performed              Past Medical History:  Diagnosis Date   Anxiety    Breast cancer in female [facility])    Right   Depression    GERD (gastroesophageal reflux disease)    Hypertension    Palpitations    Port-A-Cath in place 11/20/2020   Pre-diabetes    Sleep apnea    uses CPAP   Past Surgical History:  Procedure Laterality Date   BREAST BIOPSY Right 10/09/2022   MM RT BREAST BX W LOC DEV 1ST LESION IMAGE BX SPEC STEREO GUIDE 10/09/2022 [facility]MO[facility]   BREAST LUMPECTOMY WITH RADIOACTIVE SEED AND SENTINEL LYMPH NODE BIOPSY Right 10/16/2020   Procedure: RIGHT BREAST LUMPECTOMY WITH RADIOACTIVE SEED AND SENTINEL LYMPH NODE BIOPSY;  Surgeon: Griselda Miner, MD;  [facility];  Service: General;  Laterality: Right;   BREAST SURGERY     PORT-A-CATH REMOVAL N/A 12/28/2021   Procedure: REMOVAL PORT-A-CATH;  Surgeon: Griselda Miner, MD;  Location: [facility];  Service: General;  Laterality: N/A;   PORTACATH PLACEMENT Left 10/16/2020   Procedure: INSERTION PORT-A-CATH;  Surgeon: Griselda Miner, MD;  Location: [facility] OR;  Service: General;  Laterality: Left;   ROBOTIC ASSISTED BILATERAL SALPINGO OOPHERECTOMY Bilateral 04/17/2022   Procedure: XI ROBOTIC ASSISTED BILATERAL SALPINGO OOPHORECTOMY;  Surgeon: Carver Fila, MD;  [facility];  Service: Gynecology;  Laterality: Bilateral;   TUBAL LIGATION     Patient Active Problem List   Diagnosis Date Noted   Osteoporosis  10/14/2022   Monoallelic mutation of PALB2 gene    Malignant neoplasm of 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 Carolynne Edouard 0/4 nodes positive due to ER+ breast cancer with chemotherapy and radiation, completed radiation on 9/7,  HTN   PRECAUTIONS: right UE Lymphedema risk, None  SUBJECTIVE: Pt returns for her 6 month L-Dex screen.   PAIN:  Are you having pain? No  SOZO SCREENING: Patient was assessed today using the SOZO machine to determine the lymphedema index score. This was compared to her baseline score. It was determined that she is within the recommended range when compared to her baseline and no further action is needed at this time. She will continue SOZO screenings. These are done every 3 months for 2 years post operatively followed by every 6 months for 2 years, and then annually.   L-DEX FLOWSHEETS - 07/07/23 0900       L-DEX LYMPHEDEMA SCREENING   Measurement Type Unilateral    L-DEX MEASUREMENT EXTREMITY Upper Extremity    POSITION  Standing  DOMINANT SIDE Right    At Risk Side Right    BASELINE SCORE (UNILATERAL) 1    L-DEX SCORE (UNILATERAL) 1.5    VALUE CHANGE (UNILAT) 0.5            P:  6 month screens next.   Hermenia Bers, PTA 07/07/2023, 9:22 AM

## 2023-07-19 ENCOUNTER — Other Ambulatory Visit (HOSPITAL_COMMUNITY): Payer: Self-pay

## 2023-08-19 DIAGNOSIS — E119 Type 2 diabetes mellitus without complications: Secondary | ICD-10-CM | POA: Diagnosis not present

## 2023-09-15 ENCOUNTER — Encounter: Payer: Self-pay | Admitting: Adult Health

## 2023-09-15 ENCOUNTER — Other Ambulatory Visit (HOSPITAL_COMMUNITY): Payer: Self-pay

## 2023-09-23 ENCOUNTER — Encounter: Payer: Self-pay | Admitting: Adult Health

## 2023-09-30 ENCOUNTER — Ambulatory Visit
Admission: RE | Admit: 2023-09-30 | Discharge: 2023-09-30 | Disposition: A | Discharge status: Home / Self Care | Payer: 59 | Source: Ambulatory Visit | Attending: Hematology and Oncology

## 2023-09-30 DIAGNOSIS — Z853 Personal history of malignant neoplasm of breast: Secondary | ICD-10-CM | POA: Diagnosis not present

## 2023-09-30 DIAGNOSIS — R921 Mammographic calcification found on diagnostic imaging of breast: Secondary | ICD-10-CM

## 2023-09-30 DIAGNOSIS — Z9889 Other specified postprocedural states: Secondary | ICD-10-CM | POA: Diagnosis not present

## 2023-10-05 DIAGNOSIS — G4733 Obstructive sleep apnea (adult) (pediatric): Secondary | ICD-10-CM | POA: Diagnosis not present

## 2023-10-07 ENCOUNTER — Other Ambulatory Visit (HOSPITAL_COMMUNITY): Payer: Self-pay

## 2023-10-20 ENCOUNTER — Other Ambulatory Visit: Payer: Self-pay | Admitting: *Deleted

## 2023-10-20 DIAGNOSIS — C50211 Malignant neoplasm of upper-inner quadrant of right female breast: Secondary | ICD-10-CM

## 2023-10-21 ENCOUNTER — Telehealth: Payer: Self-pay | Admitting: Adult Health

## 2023-10-21 ENCOUNTER — Inpatient Hospital Stay: Payer: 59

## 2023-10-21 ENCOUNTER — Other Ambulatory Visit: Payer: 59

## 2023-10-21 ENCOUNTER — Other Ambulatory Visit (HOSPITAL_COMMUNITY): Payer: Self-pay

## 2023-10-21 ENCOUNTER — Encounter: Payer: Self-pay | Admitting: Adult Health

## 2023-10-21 ENCOUNTER — Inpatient Hospital Stay: Payer: 59 | Attending: Adult Health | Admitting: Adult Health

## 2023-10-21 ENCOUNTER — Ambulatory Visit: Payer: 59 | Admitting: Hematology and Oncology

## 2023-10-21 VITALS — BP 112/77 | HR 71 | Resp 18

## 2023-10-21 VITALS — BP 108/79 | HR 68 | Temp 97.6°F | Resp 18 | Ht 66.0 in | Wt 241.4 lb

## 2023-10-21 DIAGNOSIS — C50211 Malignant neoplasm of upper-inner quadrant of right female breast: Secondary | ICD-10-CM | POA: Insufficient documentation

## 2023-10-21 DIAGNOSIS — Z1731 Human epidermal growth factor receptor 2 positive status: Secondary | ICD-10-CM | POA: Insufficient documentation

## 2023-10-21 DIAGNOSIS — E7800 Pure hypercholesterolemia, unspecified: Secondary | ICD-10-CM | POA: Diagnosis not present

## 2023-10-21 DIAGNOSIS — I10 Essential (primary) hypertension: Secondary | ICD-10-CM | POA: Diagnosis not present

## 2023-10-21 DIAGNOSIS — M816 Localized osteoporosis [Lequesne]: Secondary | ICD-10-CM

## 2023-10-21 DIAGNOSIS — Z1509 Genetic susceptibility to other malignant neoplasm: Secondary | ICD-10-CM | POA: Diagnosis not present

## 2023-10-21 DIAGNOSIS — Z87891 Personal history of nicotine dependence: Secondary | ICD-10-CM | POA: Insufficient documentation

## 2023-10-21 DIAGNOSIS — Z1721 Progesterone receptor positive status: Secondary | ICD-10-CM | POA: Insufficient documentation

## 2023-10-21 DIAGNOSIS — Z1501 Genetic susceptibility to malignant neoplasm of breast: Secondary | ICD-10-CM | POA: Diagnosis not present

## 2023-10-21 DIAGNOSIS — Z9189 Other specified personal risk factors, not elsewhere classified: Secondary | ICD-10-CM | POA: Diagnosis not present

## 2023-10-21 DIAGNOSIS — F410 Panic disorder [episodic paroxysmal anxiety] without agoraphobia: Secondary | ICD-10-CM | POA: Diagnosis not present

## 2023-10-21 DIAGNOSIS — Z1589 Genetic susceptibility to other disease: Secondary | ICD-10-CM

## 2023-10-21 DIAGNOSIS — Z0000 Encounter for general adult medical examination without abnormal findings: Secondary | ICD-10-CM | POA: Diagnosis not present

## 2023-10-21 DIAGNOSIS — E1169 Type 2 diabetes mellitus with other specified complication: Secondary | ICD-10-CM | POA: Diagnosis not present

## 2023-10-21 DIAGNOSIS — Z170 Estrogen receptor positive status [ER+]: Secondary | ICD-10-CM | POA: Diagnosis not present

## 2023-10-21 DIAGNOSIS — Z79811 Long term (current) use of aromatase inhibitors: Secondary | ICD-10-CM | POA: Diagnosis not present

## 2023-10-21 DIAGNOSIS — M810 Age-related osteoporosis without current pathological fracture: Secondary | ICD-10-CM | POA: Diagnosis not present

## 2023-10-21 DIAGNOSIS — G4733 Obstructive sleep apnea (adult) (pediatric): Secondary | ICD-10-CM | POA: Diagnosis not present

## 2023-10-21 LAB — CBC WITH DIFFERENTIAL (CANCER CENTER ONLY)
Abs Immature Granulocytes: 0.01 10*3/uL (ref 0.00–0.07)
Basophils Absolute: 0.1 10*3/uL (ref 0.0–0.1)
Basophils Relative: 1 %
Eosinophils Absolute: 0.3 10*3/uL (ref 0.0–0.5)
Eosinophils Relative: 7 %
HCT: 40.4 % (ref 36.0–46.0)
Hemoglobin: 13 g/dL (ref 12.0–15.0)
Immature Granulocytes: 0 %
Lymphocytes Relative: 42 %
Lymphs Abs: 2.2 10*3/uL (ref 0.7–4.0)
MCH: 28 pg (ref 26.0–34.0)
MCHC: 32.2 g/dL (ref 30.0–36.0)
MCV: 86.9 fL (ref 80.0–100.0)
Monocytes Absolute: 0.4 10*3/uL (ref 0.1–1.0)
Monocytes Relative: 8 %
Neutro Abs: 2.2 10*3/uL (ref 1.7–7.7)
Neutrophils Relative %: 42 %
Platelet Count: 291 10*3/uL (ref 150–400)
RBC: 4.65 MIL/uL (ref 3.87–5.11)
RDW: 14.6 % (ref 11.5–15.5)
WBC Count: 5.2 10*3/uL (ref 4.0–10.5)
nRBC: 0 % (ref 0.0–0.2)

## 2023-10-21 LAB — CMP (CANCER CENTER ONLY)
ALT: 14 U/L (ref 0–44)
AST: 14 U/L — ABNORMAL LOW (ref 15–41)
Albumin: 4.6 g/dL (ref 3.5–5.0)
Alkaline Phosphatase: 59 U/L (ref 38–126)
Anion gap: 8 (ref 5–15)
BUN: 13 mg/dL (ref 6–20)
CO2: 26 mmol/L (ref 22–32)
Calcium: 9.9 mg/dL (ref 8.9–10.3)
Chloride: 104 mmol/L (ref 98–111)
Creatinine: 0.92 mg/dL (ref 0.44–1.00)
GFR, Estimated: >60 mL/min (ref >60)
Glucose, Bld: 82 mg/dL (ref 70–99)
Potassium: 4 mmol/L (ref 3.5–5.1)
Sodium: 138 mmol/L (ref 135–145)
Total Bilirubin: 0.4 mg/dL (ref 0.0–1.2)
Total Protein: 8.1 g/dL (ref 6.5–8.1)

## 2023-10-21 MED ORDER — OZEMPIC (0.25 OR 0.5 MG/DOSE) 2 MG/3ML ~~LOC~~ SOPN
0.2500 mg | PEN_INJECTOR | SUBCUTANEOUS | 11 refills | Status: AC
  Filled 2023-10-21: qty 3, 28d supply, fill #0
  Filled 2023-11-30: qty 3, 28d supply, fill #1
  Filled 2023-12-24: qty 3, 28d supply, fill #2
  Filled 2024-01-21: qty 3, 28d supply, fill #3
  Filled 2024-02-19: qty 3, 28d supply, fill #4
  Filled 2024-03-17: qty 3, 28d supply, fill #5
  Filled 2024-04-14: qty 3, 28d supply, fill #6
  Filled 2024-07-05 – 2024-09-01 (×2): qty 3, 28d supply, fill #7

## 2023-10-21 MED ORDER — SODIUM CHLORIDE 0.9 % IV SOLN: Freq: Once | INTRAVENOUS | Status: AC

## 2023-10-21 MED ORDER — ZOLEDRONIC ACID 4 MG/100ML IV SOLN
4.0000 mg | Freq: Once | INTRAVENOUS | Status: AC
  Administered 2023-10-21: 4 mg via INTRAVENOUS
  Filled 2023-10-21: qty 100

## 2023-10-21 NOTE — Progress Notes (Signed)
 Pt. denies any dental procedures or issues at this time.

## 2023-10-21 NOTE — Telephone Encounter (Signed)
 Scheduled appointments per 3/25 los. Talked with the patient and she is aware of the made appointments.

## 2023-10-21 NOTE — Progress Notes (Signed)
 [facility] Cancer Follow up:    Grace Dills, MD [address] [place] [address]   DIAGNOSIS:  Cancer Staging  Malignant neoplasm of upper-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, estrogen receptor positive (HCC) Staging form: Breast, AJCC 8th Edition - Clinical stage from 09/08/2020: Stage IA (cT1b, cN0, cM0, G2, ER+, PR+, HER2+) - Signed by Loa Socks, NP on 09/20/2020 Stage prefix: Initial diagnosis - Pathologic stage from 10/16/2020: Stage IA (pT1b, pN0, cM0, G3, ER+, PR+, HER2+) - Signed by Loa Socks, NP on 11/01/2020 Histologic grading system: 3 grade system   SUMMARY OF ONCOLOGIC HISTORY: Oncology History  Malignant neoplasm of upper-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, estrogen receptor positive (HCC)  09/08/2020 Cancer Staging   Staging form: Breast, AJCC 8th Edition - Clinical stage from 09/08/2020: Stage IA (cT1b, cN0, cM0, G2, ER+, PR+, HER2+) - Signed by Loa Socks, NP on 09/20/2020 Stage prefix: Initial diagnosis   09/08/2020 Initial Diagnosis   Mammogram detected 0.7 cm mass in the right breast 1 o'clock position, right breast biopsy 1: Grade 2 IDC ER 95% PR 95%, Ki-67 30%, HER-2 positive ratio 2.83   10/16/2020 Surgery   Right lumpectomy Carolynne Edouard): IDC, 1.0cm, grade 3, with high grade DCIS, clear margins, 4 right axillary lymph nodes negative for carcinoma.   10/16/2020 Cancer Staging   Staging form: Breast, AJCC 8th Edition - Pathologic stage from 10/16/2020: Stage IA (pT1b, pN0, cM0, G3, ER+, PR+, HER2+) - Signed by Loa Socks, NP on 11/01/2020 Histologic grading system: 3 grade system   11/13/2020 - 12/14/2021 Chemotherapy   Patient is on Treatment Plan : BREAST Paclitaxel + Trastuzumab q7d / Trastuzumab q21d     02/15/2021 - 04/04/2021 Radiation Therapy   Site Technique Total Dose (Gy) Dose per Fx (Gy) Completed Fx Beam Energies  Breast, Right: Breast_Rt 3D 50.4/50.4 1.8 28/28 10X   Breast, Right: Breast_Rt_Bst 3D 10/10 2 5/5 6X, 10X     07/20/2021 -  Anti-estrogen oral therapy   Letrozole x 7 years   11/23/2021 Miscellaneous   Genetics (done at [facility]):  PALBS and MUTYH pathogenic mutations (additional VUS)   04/17/2022 Surgery   Bilateral salpingo-oophorectomy     CURRENT THERAPY: Letrozole  INTERVAL HISTORY:  Discussed the use of AI scribe software for clinical note transcription with the patient, who gave verbal consent to proceed.  Grace Pennington 53 y.o. female  a patient with a history of breast cancer and a PALB2 mutation, presents for a follow-up visit. She reports no new symptoms or changes in her health. She underwent a mammogram, which showed no concerning findings. She also had a breast MRI 04/2023 due to her PALB2 mutation, which also showed no concerning findings. She denies any new family history of pancreatic cancer or any symptoms of gastrointestinal bleeding or changes in stool. She had a gastroenterology evaluation with a stent placement, which showed no concerning findings.   She is unsure about the status of her MUTYH mutation. She is currently on letrozole for her breast cancer and reports no issues with it. Her last bone density test showed early osteoporosis. She is currently receiving infusions for her bone health, which she reports are going well. She has some pain in her knee, but is managing it with stretching exercises. Her labs today were normal.   Patient Active Problem List   Diagnosis Date Noted   Osteoporosis 10/14/2022   Monoallelic mutation of PALB2 gene  Malignant neoplasm of upper-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, estrogen receptor positive (HCC) 09/20/2020   Precordial pain 04/14/2014   Essential hypertension, benign 08/03/2013   Family history of ischemic heart disease 08/03/2013    has no known allergies.  MEDICAL HISTORY: Past Medical History:  Diagnosis Date   Anxiety    Breast cancer in  female [facility])    Right   Depression    GERD (gastroesophageal reflux disease)    Hypertension    Palpitations    Port-A-Cath in place 11/20/2020   Pre-diabetes    Sleep apnea    uses CPAP    SURGICAL HISTORY: Past Surgical History:  Procedure Laterality Date   BREAST BIOPSY Right 10/09/2022   MM RT BREAST BX W LOC DEV 1ST LESION IMAGE BX SPEC STEREO GUIDE 10/09/2022 [facility] MAMMOGRAPHY   BREAST LUMPECTOMY WITH RADIOACTIVE SEED AND SENTINEL LYMPH NODE BIOPSY Right 10/16/2020   Procedure: RIGHT BREAST LUMPECTOMY WITH RADIOACTIVE SEED AND SENTINEL LYMPH NODE BIOPSY;  Surgeon: Griselda Miner, MD;  Location: [facility] OR;  Service: General;  Laterality: Right;   BREAST SURGERY     PORT-A-CATH REMOVAL N/A 12/28/2021   Procedure: REMOVAL PORT-A-CATH;  Surgeon: Griselda Miner, MD;  Location: [facility];  Service: General;  Laterality: N/A;   PORTACATH PLACEMENT Left 10/16/2020   Procedure: INSERTION PORT-A-CATH;  Surgeon: Griselda Miner, MD;  Location: [facility] OR;  Service: General;  Laterality: Left;   ROBOTIC ASSISTED BILATERAL SALPINGO OOPHERECTOMY Bilateral 04/17/2022   Procedure: XI ROBOTIC ASSISTED BILATERAL SALPINGO OOPHORECTOMY;  Surgeon: Carver Fila, MD;  Location: [facility] ORS;  Service: Gynecology;  Laterality: Bilateral;   TUBAL LIGATION      SOCIAL HISTORY: Social History   Socioeconomic History   Marital status: Single    Spouse name: Not on file   Number of children: 2   Years of education: Not on file   Highest education level: Not on file  Occupational History   Occupation: medical records    Employer: [employer]  Tobacco Use   Smoking status: Former    Current packs/day: 0.00    Types: Cigarettes    Quit date: 07/29/1996    Years since quitting: 27.2   Smokeless tobacco: Never  Vaping Use   Vaping status: Never Used  Substance and Sexual Activity   Alcohol use: Yes    Comment: socially   Drug use: No   Sexual activity: Not Currently    Birth  control/protection: Surgical  Other Topics Concern   Not on file  Social History Narrative   Not on file   Social Drivers of Health   Financial Resource Strain: Not on file  Food Insecurity: Not on file  Transportation Needs: Not on file  Physical Activity: Not on file  Stress: Not on file  Social Connections: Not on file  Intimate Partner Violence: Not At Risk (01/24/2021)   Humiliation, Afraid, Rape, and Kick questionnaire    Fear of Current or Ex-Partner: No    Emotionally Abused: No    Physically Abused: No    Sexually Abused: No    FAMILY HISTORY: Family History  Problem Relation Age of Onset   Heart disease Mother    Diabetes Mother    Hyperlipidemia Mother    Hypertension Mother    Kidney failure Mother    Breast cancer Mother    Sleep apnea Father    Diabetes Father    Hyperlipidemia Father    Hypertension Father  Prostate cancer Father    Diabetes Other    Stroke Other    Hypertension Other    Kidney failure Other    Coronary artery disease Other    Colon cancer Neg Hx    Ovarian cancer Neg Hx    Endometrial cancer Neg Hx    Pancreatic cancer Neg Hx     Review of Systems  Constitutional:  Negative for appetite change, chills, fatigue, fever and unexpected weight change.  HENT:   Negative for hearing loss, lump/mass and trouble swallowing.   Eyes:  Negative for eye problems and icterus.  Respiratory:  Negative for chest tightness, cough and shortness of breath.   Cardiovascular:  Negative for chest pain, leg swelling and palpitations.  Gastrointestinal:  Negative for abdominal distention, abdominal pain, constipation, diarrhea, nausea and vomiting.  Endocrine: Negative for hot flashes.  Genitourinary:  Negative for difficulty urinating.   Musculoskeletal:  Negative for arthralgias.  Skin:  Negative for itching and rash.  Neurological:  Negative for dizziness, extremity weakness, headaches and numbness.  Hematological:  Negative for adenopathy. Does  not bruise/bleed easily.  Psychiatric/Behavioral:  Negative for depression. The patient is not nervous/anxious.       PHYSICAL EXAMINATION    Vitals:   10/21/23 0940  BP: 108/79  Pulse: 68  Resp: 18  Temp: 97.6 F (36.4 C)  SpO2: 100%    Physical Exam Constitutional:      General: She is not in acute distress.    Appearance: Normal appearance. She is not toxic-appearing.  HENT:     Head: Normocephalic and atraumatic.     Mouth/Throat:     Mouth: Mucous membranes are moist.     Pharynx: Oropharynx is clear. No oropharyngeal exudate or posterior oropharyngeal erythema.  Eyes:     General: No scleral icterus. Cardiovascular:     Rate and Rhythm: Normal rate and regular rhythm.     Pulses: Normal pulses.     Heart sounds: Normal heart sounds.  Pulmonary:     Effort: Pulmonary effort is normal.     Breath sounds: Normal breath sounds.  Chest:     Comments: Right breast status postlumpectomy and radiation no sign of local recurrence left breast is benign. Abdominal:     General: Abdomen is flat. Bowel sounds are normal. There is no distension.     Palpations: Abdomen is soft.     Tenderness: There is no abdominal tenderness.  Musculoskeletal:        General: No swelling.     Cervical back: Neck supple.  Lymphadenopathy:     Cervical: No cervical adenopathy.     Upper Body:     Right upper body: No supraclavicular or axillary adenopathy.     Left upper body: No supraclavicular or axillary adenopathy.  Skin:    General: Skin is warm and dry.     Findings: No rash.  Neurological:     General: No focal deficit present.     Mental Status: She is alert.  Psychiatric:        Mood and Affect: Mood normal.        Behavior: Behavior normal.    LABORATORY DATA:  CBC    Component Value Date/Time   WBC 5.2 10/21/2023 0901   WBC 5.4 04/09/2022 1030   RBC 4.65 10/21/2023 0901   HGB 13.0 10/21/2023 0901   HGB 12.6 10/07/2017 1200   HCT 40.4 10/21/2023 0901   HCT  39.4 10/07/2017 1200   PLT 291  10/21/2023 0901   MCV 86.9 10/21/2023 0901   MCV 83 10/07/2017 1200   MCH 28.0 10/21/2023 0901   MCHC 32.2 10/21/2023 0901   RDW 14.6 10/21/2023 0901   RDW 16.6 (H) 10/07/2017 1200   LYMPHSABS 2.2 10/21/2023 0901   LYMPHSABS 2.8 10/07/2017 1200   MONOABS 0.4 10/21/2023 0901   EOSABS 0.3 10/21/2023 0901   EOSABS 0.2 10/07/2017 1200   BASOSABS 0.1 10/21/2023 0901   BASOSABS 0.0 10/07/2017 1200    CMP     Component Value Date/Time   NA 138 10/21/2023 0901   NA 143 08/13/2018 0749   K 4.0 10/21/2023 0901   CL 104 10/21/2023 0901   CO2 26 10/21/2023 0901   GLUCOSE 82 10/21/2023 0901   BUN 13 10/21/2023 0901   BUN 19 08/13/2018 0749   CREATININE 0.92 10/21/2023 0901   CALCIUM 9.9 10/21/2023 0901   PROT 8.1 10/21/2023 0901   PROT 7.3 08/13/2018 0749   ALBUMIN 4.6 10/21/2023 0901   ALBUMIN 4.2 08/13/2018 0749   AST 14 (L) 10/21/2023 0901   ALT 14 10/21/2023 0901   ALKPHOS 59 10/21/2023 0901   BILITOT 0.4 10/21/2023 0901   GFRNONAA >60 10/21/2023 0901   GFRAA 83 08/13/2018 0749    ASSESSMENT and THERAPY PLAN:   Malignant neoplasm of 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 Normal mammogram. MRI due to PALB2 mutation. Letrozole tolerated. Ongoing Zometa for bone strength and recurrence prevention. No dental issues reported. - Repeat breast MRI in October 2025. - Schedule mammogram for March 2026. - Continue letrozole. - Continue Zometa. - Monitor for dental issues. - Encourage healthy diet and exercise.  Osteoporosis Early osteoporosis. Last bone density test in 2023. Receiving Zometa. - Order bone density test. - Review results post-testing. - Continue Zometa.  MUTYH mutation carrier status Unclear mutation status. No new family history or symptoms. - Review genetic testing from [facility] when obtained (nurses are requesting fax from their office)  RTC in  1year for labs, f/u and Zometa  All questions were answered. The patient knows to call the clinic with any problems, questions or concerns. We can certainly see the patient much sooner if necessary.  Total encounter time:30 minutes*in face-to-face visit time, chart review, lab review, care coordination, order entry, and documentation of the encounter time.    Lillard Anes, NP 10/21/23 3:19 PM Medical Oncology and Hematology [facility] [address] [place], [place] [address] Tel. [phone]    Fax. [phone]  *Total Encounter Time as defined by the Centers for Medicare and Medicaid Services includes, in addition to the face-to-face time of a patient visit (documented in the note above) non-face-to-face time: obtaining and reviewing outside history, ordering and reviewing medications, tests or procedures, care coordination (communications with other health care professionals or caregivers) and documentation in the medical record.

## 2023-10-21 NOTE — Patient Instructions (Signed)

## 2023-10-21 NOTE — Assessment & Plan Note (Signed)
 Breast cancer No recurrence. Normal mammogram. MRI due to PALB2 mutation. Letrozole tolerated. Ongoing Zometa for bone strength and recurrence prevention. No dental issues reported. - Repeat breast MRI in October 2025. - Schedule mammogram for March 2026. - Continue letrozole. - Continue Zometa. - Monitor for dental issues. - Encourage healthy diet and exercise.  Osteoporosis Early osteoporosis. Last bone density test in 2023. Receiving Zometa. - Order bone density test. - Review results post-testing. - Continue Zometa.  MUTYH mutation carrier status Unclear mutation status. No new family history or symptoms. - Review genetic testing from [facility] when obtained (nurses are requesting fax from their office)  RTC in 1year for labs, f/u and Zometa

## 2023-10-22 ENCOUNTER — Other Ambulatory Visit (HOSPITAL_COMMUNITY): Payer: Self-pay

## 2023-10-23 ENCOUNTER — Other Ambulatory Visit (HOSPITAL_COMMUNITY): Payer: Self-pay

## 2023-10-27 ENCOUNTER — Other Ambulatory Visit (HOSPITAL_COMMUNITY): Payer: Self-pay

## 2023-10-30 ENCOUNTER — Other Ambulatory Visit (HOSPITAL_COMMUNITY): Payer: Self-pay

## 2023-10-30 DIAGNOSIS — Z1331 Encounter for screening for depression: Secondary | ICD-10-CM | POA: Diagnosis not present

## 2023-10-30 DIAGNOSIS — Z01419 Encounter for gynecological examination (general) (routine) without abnormal findings: Secondary | ICD-10-CM | POA: Diagnosis not present

## 2023-10-30 MED ORDER — SPIRONOLACTONE 50 MG PO TABS
50.0000 mg | ORAL_TABLET | Freq: Two times a day (BID) | ORAL | 3 refills | Status: AC
  Filled 2024-01-01: qty 180, 90d supply, fill #0
  Filled 2024-04-14: qty 180, 90d supply, fill #1
  Filled 2024-09-01: qty 180, 90d supply, fill #2

## 2023-10-31 ENCOUNTER — Other Ambulatory Visit (HOSPITAL_COMMUNITY): Payer: Self-pay

## 2023-11-01 DIAGNOSIS — R079 Chest pain, unspecified: Secondary | ICD-10-CM | POA: Diagnosis not present

## 2023-11-01 DIAGNOSIS — R0789 Other chest pain: Secondary | ICD-10-CM | POA: Diagnosis not present

## 2023-11-01 DIAGNOSIS — J9811 Atelectasis: Secondary | ICD-10-CM | POA: Diagnosis not present

## 2023-11-02 DIAGNOSIS — R079 Chest pain, unspecified: Secondary | ICD-10-CM | POA: Diagnosis not present

## 2023-12-01 ENCOUNTER — Other Ambulatory Visit (HOSPITAL_COMMUNITY): Payer: Self-pay

## 2023-12-10 ENCOUNTER — Other Ambulatory Visit: Payer: Self-pay

## 2023-12-10 ENCOUNTER — Other Ambulatory Visit (HOSPITAL_COMMUNITY): Payer: Self-pay

## 2023-12-24 ENCOUNTER — Other Ambulatory Visit (HOSPITAL_COMMUNITY): Payer: Self-pay

## 2024-01-01 ENCOUNTER — Other Ambulatory Visit: Payer: Self-pay

## 2024-01-01 ENCOUNTER — Other Ambulatory Visit (HOSPITAL_COMMUNITY): Payer: Self-pay

## 2024-01-05 ENCOUNTER — Ambulatory Visit: Payer: 59 | Attending: General Surgery

## 2024-01-05 VITALS — Wt 231.0 lb

## 2024-01-05 DIAGNOSIS — Z483 Aftercare following surgery for neoplasm: Secondary | ICD-10-CM | POA: Insufficient documentation

## 2024-01-05 NOTE — Therapy (Addendum)
 OUTPATIENT PHYSICAL THERAPY SOZO SCREENING NOTE   Patient Name: Grace Pennington MRN: [id number] DOB:01/19/1971, 53 y.o., female Today's Date: 01/05/2024  PCP: Merl Star, MD REFERRING PROVIDER: Caralyn Chandler, MD   PT End of Session - 01/05/24 0915     Visit Number 7   # unchanged due to screen only   PT Start Time 0911    PT Stop Time 0918    PT Time Calculation (min) 7 min    Activity Tolerance Patient tolerated treatment well    Behavior During Therapy [facility] for tasks assessed/performed              Past Medical History:  Diagnosis Date   Anxiety    Breast cancer in female [facility])    Right   Depression    GERD (gastroesophageal reflux disease)    Hypertension    Palpitations    Port-A-Cath in place 11/20/2020   Pre-diabetes    Sleep apnea    uses CPAP   Past Surgical History:  Procedure Laterality Date   BREAST BIOPSY Right 10/09/2022   MM RT BREAST BX W LOC DEV 1ST LESION IMAGE BX SPEC STEREO GUIDE 10/09/2022 [facility] MAMMOGRAPHY   BREAST LUMPECTOMY WITH RADIOACTIVE SEED AND SENTINEL LYMPH NODE BIOPSY Right 10/16/2020   Procedure: RIGHT BREAST LUMPECTOMY WITH RADIOACTIVE SEED AND SENTINEL LYMPH NODE BIOPSY;  Surgeon: Caralyn Chandler, MD;  Location: [facility] OR;  Service: General;  Laterality: Right;   BREAST SURGERY     PORT-A-CATH REMOVAL N/A 12/28/2021   Procedure: REMOVAL PORT-A-CATH;  Surgeon: Caralyn Chandler, MD;  Location: [facility];  Service: General;  Laterality: N/A;   PORTACATH PLACEMENT Left 10/16/2020   Procedure: INSERTION PORT-A-CATH;  Surgeon: Caralyn Chandler, MD;  Location: [facility] OR;  Service: General;  Laterality: Left;   ROBOTIC ASSISTED BILATERAL SALPINGO OOPHERECTOMY Bilateral 04/17/2022   Procedure: XI ROBOTIC ASSISTED BILATERAL SALPINGO OOPHORECTOMY;  Surgeon: Suzi Essex, MD;  Location: [facility];  Service: Gynecology;  Laterality: Bilateral;   TUBAL LIGATION     Patient Active Problem List   Diagnosis Date Noted   Osteoporosis  10/14/2022   Monoallelic mutation of PALB2 gene    Malignant neoplasm of 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 Alethea Andes 0/4 nodes positive due to ER+ breast cancer with chemotherapy and radiation, completed radiation on 9/7,  HTN   PRECAUTIONS: right UE Lymphedema risk, None  SUBJECTIVE: Pt returns for her 6 month L-Dex screen. "The outside of my upper arm has been bothering me and I've been having to lift my arm with other hand to stretch it."   PAIN:  Are you having pain? No  SOZO SCREENING: Patient was assessed today using the SOZO machine to determine the lymphedema index score. This was compared to her baseline score. It was determined that she is within the recommended range when compared to her baseline and no further action is needed at this time. She will continue SOZO screenings. These are done every 3 months for 2 years post operatively followed by every 6 months for 2 years, and then annually.  Reminded pt of importance of continuing with her HEP stretches and suggested  she should try using the mirror to help her identify her compensations because she is compensating with her Rt scapula with fingers clasped OH stretching. Also advised her that if she doesn't start seeing improvement to let her MD know so she can either have T or see an orthopedist depending on if she has a lot of pain. Pt verbalized good understanding.   L-DEX FLOWSHEETS - 01/05/24 0900       L-DEX LYMPHEDEMA SCREENING   Measurement Type Unilateral    L-DEX MEASUREMENT EXTREMITY Upper Extremity    POSITION  Standing    DOMINANT SIDE Right    At Risk  Side Right    BASELINE SCORE (UNILATERAL) 1    L-DEX SCORE (UNILATERAL) 1.5    VALUE CHANGE (UNILAT) 0.5            P:  Cont 6 month screens until 4 years from surgery.  Denyce Flank, PTA 01/05/2024, 9:20 AM

## 2024-01-20 ENCOUNTER — Other Ambulatory Visit: Payer: Self-pay

## 2024-01-21 ENCOUNTER — Other Ambulatory Visit (HOSPITAL_COMMUNITY): Payer: Self-pay

## 2024-02-02 ENCOUNTER — Other Ambulatory Visit (HOSPITAL_COMMUNITY): Payer: Self-pay

## 2024-02-20 ENCOUNTER — Other Ambulatory Visit (HOSPITAL_COMMUNITY): Payer: Self-pay

## 2024-03-04 ENCOUNTER — Other Ambulatory Visit: Payer: Self-pay

## 2024-03-04 ENCOUNTER — Other Ambulatory Visit (HOSPITAL_BASED_OUTPATIENT_CLINIC_OR_DEPARTMENT_OTHER): Payer: Self-pay

## 2024-03-09 ENCOUNTER — Other Ambulatory Visit (HOSPITAL_COMMUNITY): Payer: Self-pay

## 2024-03-09 MED ORDER — LOSARTAN POTASSIUM 25 MG PO TABS
25.0000 mg | ORAL_TABLET | Freq: Every day | ORAL | 3 refills | Status: AC
  Filled 2024-03-09: qty 90, 90d supply, fill #0
  Filled 2024-06-06: qty 90, 90d supply, fill #1
  Filled 2024-09-01: qty 90, 90d supply, fill #2

## 2024-03-17 ENCOUNTER — Other Ambulatory Visit (HOSPITAL_COMMUNITY): Payer: Self-pay

## 2024-03-29 ENCOUNTER — Other Ambulatory Visit (HOSPITAL_COMMUNITY): Payer: Self-pay

## 2024-03-30 ENCOUNTER — Other Ambulatory Visit (HOSPITAL_COMMUNITY): Payer: Self-pay

## 2024-03-30 MED ORDER — METFORMIN HCL 1000 MG PO TABS
1000.0000 mg | ORAL_TABLET | Freq: Two times a day (BID) | ORAL | 3 refills | Status: AC
  Filled 2024-03-30: qty 180, 90d supply, fill #0
  Filled 2024-07-05 (×2): qty 180, 90d supply, fill #1

## 2024-04-14 ENCOUNTER — Other Ambulatory Visit (HOSPITAL_COMMUNITY): Payer: Self-pay

## 2024-04-19 ENCOUNTER — Other Ambulatory Visit: Payer: Self-pay

## 2024-04-23 ENCOUNTER — Other Ambulatory Visit (HOSPITAL_COMMUNITY): Payer: Self-pay

## 2024-04-23 MED ORDER — OZEMPIC (0.25 OR 0.5 MG/DOSE) 2 MG/3ML ~~LOC~~ SOPN
0.5000 mg | PEN_INJECTOR | SUBCUTANEOUS | 11 refills | Status: AC
  Filled 2024-04-23: qty 12, 84d supply, fill #0
  Filled 2024-05-07: qty 9, 84d supply, fill #0
  Filled 2024-07-16: qty 9, 84d supply, fill #1

## 2024-04-26 ENCOUNTER — Other Ambulatory Visit (HOSPITAL_COMMUNITY): Payer: Self-pay

## 2024-04-26 ENCOUNTER — Other Ambulatory Visit: Payer: Self-pay

## 2024-04-26 MED ORDER — CITALOPRAM HYDROBROMIDE 10 MG PO TABS
10.0000 mg | ORAL_TABLET | Freq: Every day | ORAL | 3 refills | Status: AC
  Filled 2024-04-26: qty 90, 90d supply, fill #0
  Filled 2024-07-18: qty 90, 90d supply, fill #1

## 2024-05-03 ENCOUNTER — Ambulatory Visit
Admission: RE | Admit: 2024-05-03 | Discharge: 2024-05-03 | Disposition: A | Source: Ambulatory Visit | Attending: Adult Health | Admitting: Adult Health

## 2024-05-03 DIAGNOSIS — Z9189 Other specified personal risk factors, not elsewhere classified: Secondary | ICD-10-CM

## 2024-05-03 DIAGNOSIS — Z1501 Genetic susceptibility to malignant neoplasm of breast: Secondary | ICD-10-CM

## 2024-05-03 MED ORDER — GADOPICLENOL 0.5 MMOL/ML IV SOLN
10.0000 mL | Freq: Once | INTRAVENOUS | Status: AC | PRN
  Administered 2024-05-03: 10 mL via INTRAVENOUS

## 2024-05-04 ENCOUNTER — Ambulatory Visit: Payer: Self-pay

## 2024-05-07 ENCOUNTER — Other Ambulatory Visit: Payer: Self-pay

## 2024-05-21 ENCOUNTER — Encounter: Payer: Self-pay | Admitting: Adult Health

## 2024-05-21 ENCOUNTER — Other Ambulatory Visit (HOSPITAL_COMMUNITY): Payer: Self-pay

## 2024-05-21 MED ORDER — FLUZONE 0.5 ML IM SUSY
0.5000 mL | PREFILLED_SYRINGE | INTRAMUSCULAR | 0 refills | Status: AC
  Filled 2024-05-21: qty 0.5, 1d supply, fill #0

## 2024-06-06 ENCOUNTER — Other Ambulatory Visit (HOSPITAL_COMMUNITY): Payer: Self-pay

## 2024-06-07 ENCOUNTER — Other Ambulatory Visit (HOSPITAL_COMMUNITY): Payer: Self-pay

## 2024-06-07 ENCOUNTER — Other Ambulatory Visit: Payer: Self-pay

## 2024-07-05 ENCOUNTER — Other Ambulatory Visit: Payer: Self-pay

## 2024-07-05 ENCOUNTER — Ambulatory Visit: Attending: General Surgery

## 2024-07-05 ENCOUNTER — Other Ambulatory Visit (HOSPITAL_COMMUNITY): Payer: Self-pay

## 2024-07-05 DIAGNOSIS — Z483 Aftercare following surgery for neoplasm: Secondary | ICD-10-CM | POA: Insufficient documentation

## 2024-07-06 ENCOUNTER — Other Ambulatory Visit (HOSPITAL_COMMUNITY): Payer: Self-pay

## 2024-07-06 MED ORDER — ROSUVASTATIN CALCIUM 10 MG PO TABS
10.0000 mg | ORAL_TABLET | Freq: Every day | ORAL | 3 refills | Status: AC
  Filled 2024-07-06: qty 90, 90d supply, fill #0

## 2024-07-18 ENCOUNTER — Other Ambulatory Visit: Payer: Self-pay | Admitting: Hematology and Oncology

## 2024-07-19 ENCOUNTER — Other Ambulatory Visit (HOSPITAL_COMMUNITY): Payer: Self-pay

## 2024-07-19 MED ORDER — LETROZOLE 2.5 MG PO TABS
2.5000 mg | ORAL_TABLET | Freq: Every day | ORAL | 3 refills | Status: AC
  Filled 2024-07-19: qty 90, 90d supply, fill #0

## 2024-07-23 ENCOUNTER — Other Ambulatory Visit (HOSPITAL_COMMUNITY): Payer: Self-pay

## 2024-08-17 ENCOUNTER — Other Ambulatory Visit: Payer: Self-pay | Admitting: Hematology and Oncology

## 2024-08-17 DIAGNOSIS — Z1231 Encounter for screening mammogram for malignant neoplasm of breast: Secondary | ICD-10-CM

## 2024-09-01 ENCOUNTER — Other Ambulatory Visit: Payer: Self-pay

## 2024-09-01 ENCOUNTER — Other Ambulatory Visit (HOSPITAL_COMMUNITY): Payer: Self-pay

## 2024-09-06 ENCOUNTER — Ambulatory Visit: Attending: General Surgery

## 2024-10-20 ENCOUNTER — Ambulatory Visit

## 2024-10-20 ENCOUNTER — Inpatient Hospital Stay

## 2024-10-20 ENCOUNTER — Other Ambulatory Visit

## 2024-10-20 ENCOUNTER — Ambulatory Visit: Admitting: Adult Health
